# Patient Record
Sex: Female | Born: 1950
Health system: Southern US, Community
[De-identification: ages and names within clinical notes are randomized; demographics above are authoritative.]

## PROBLEM LIST (undated history)

## (undated) DIAGNOSIS — I779 Disorder of arteries and arterioles, unspecified: Secondary | ICD-10-CM

## (undated) DIAGNOSIS — M1990 Unspecified osteoarthritis, unspecified site: Secondary | ICD-10-CM

## (undated) DIAGNOSIS — Q248 Other specified congenital malformations of heart: Secondary | ICD-10-CM

## (undated) DIAGNOSIS — M545 Low back pain, unspecified: Secondary | ICD-10-CM

## (undated) DIAGNOSIS — M5430 Sciatica, unspecified side: Secondary | ICD-10-CM

## (undated) DIAGNOSIS — J40 Bronchitis, not specified as acute or chronic: Secondary | ICD-10-CM

## (undated) DIAGNOSIS — I8390 Asymptomatic varicose veins of unspecified lower extremity: Secondary | ICD-10-CM

## (undated) DIAGNOSIS — E785 Hyperlipidemia, unspecified: Secondary | ICD-10-CM

## (undated) DIAGNOSIS — R42 Dizziness and giddiness: Secondary | ICD-10-CM

## (undated) DIAGNOSIS — I10 Essential (primary) hypertension: Secondary | ICD-10-CM

## (undated) HISTORY — DX: Essential (primary) hypertension: I10

## (undated) HISTORY — DX: Low back pain: M54.5

## (undated) HISTORY — DX: Sciatica, unspecified side: M54.30

## (undated) HISTORY — PX: ABDOMINAL HYSTERECTOMY: SHX81

## (undated) HISTORY — DX: Dizziness and giddiness: R42

## (undated) HISTORY — DX: Other specified congenital malformations of heart: Q24.8

## (undated) HISTORY — DX: Unspecified osteoarthritis, unspecified site: M19.90

## (undated) HISTORY — DX: Low back pain, unspecified: M54.50

## (undated) HISTORY — DX: Bronchitis, not specified as acute or chronic: J40

## (undated) HISTORY — PX: LUMBAR FUSION: SHX111

## (undated) HISTORY — DX: Hyperlipidemia, unspecified: E78.5

## (undated) HISTORY — PX: CARPAL TUNNEL RELEASE: SHX101

## (undated) HISTORY — DX: Disorder of arteries and arterioles, unspecified: I77.9

## (undated) HISTORY — PX: INGUINAL HERNIA REPAIR: SUR1180

## (undated) HISTORY — DX: Asymptomatic varicose veins of unspecified lower extremity: I83.90

---

## 1999-06-21 ENCOUNTER — Encounter: Payer: Self-pay | Admitting: Preventative Medicine

## 1999-06-21 ENCOUNTER — Ambulatory Visit (HOSPITAL_COMMUNITY): Admission: RE | Admit: 1999-06-21 | Discharge: 1999-06-21 | Payer: Self-pay | Admitting: Preventative Medicine

## 1999-07-05 ENCOUNTER — Encounter: Payer: Self-pay | Admitting: Preventative Medicine

## 1999-07-05 ENCOUNTER — Ambulatory Visit (HOSPITAL_COMMUNITY): Admission: RE | Admit: 1999-07-05 | Discharge: 1999-07-05 | Payer: Self-pay | Admitting: Preventative Medicine

## 1999-07-19 ENCOUNTER — Ambulatory Visit (HOSPITAL_COMMUNITY): Admission: RE | Admit: 1999-07-19 | Discharge: 1999-07-19 | Payer: Self-pay | Admitting: Preventative Medicine

## 1999-07-19 ENCOUNTER — Encounter: Payer: Self-pay | Admitting: Preventative Medicine

## 1999-09-19 ENCOUNTER — Encounter: Payer: Self-pay | Admitting: Neurosurgery

## 1999-09-19 ENCOUNTER — Ambulatory Visit (HOSPITAL_COMMUNITY): Admission: RE | Admit: 1999-09-19 | Discharge: 1999-09-19 | Payer: Self-pay | Admitting: Neurosurgery

## 2000-09-19 ENCOUNTER — Ambulatory Visit (HOSPITAL_COMMUNITY): Admission: RE | Admit: 2000-09-19 | Discharge: 2000-09-19 | Payer: Self-pay | Admitting: Neurosurgery

## 2000-09-19 ENCOUNTER — Encounter: Payer: Self-pay | Admitting: Neurosurgery

## 2000-10-03 ENCOUNTER — Ambulatory Visit (HOSPITAL_COMMUNITY): Admission: RE | Admit: 2000-10-03 | Discharge: 2000-10-03 | Payer: Self-pay | Admitting: Neurosurgery

## 2000-10-03 ENCOUNTER — Encounter: Payer: Self-pay | Admitting: Neurosurgery

## 2000-10-24 ENCOUNTER — Encounter: Payer: Self-pay | Admitting: Neurosurgery

## 2000-10-24 ENCOUNTER — Ambulatory Visit (HOSPITAL_COMMUNITY): Admission: RE | Admit: 2000-10-24 | Discharge: 2000-10-24 | Payer: Self-pay | Admitting: Neurosurgery

## 2000-11-07 ENCOUNTER — Ambulatory Visit (HOSPITAL_COMMUNITY): Admission: RE | Admit: 2000-11-07 | Discharge: 2000-11-07 | Payer: Self-pay | Admitting: Neurosurgery

## 2000-11-07 ENCOUNTER — Encounter: Payer: Self-pay | Admitting: Neurosurgery

## 2001-02-11 ENCOUNTER — Ambulatory Visit (HOSPITAL_COMMUNITY): Admission: RE | Admit: 2001-02-11 | Discharge: 2001-02-12 | Payer: Self-pay | Admitting: Neurosurgery

## 2001-02-11 ENCOUNTER — Encounter: Payer: Self-pay | Admitting: Neurosurgery

## 2001-03-18 ENCOUNTER — Encounter (HOSPITAL_COMMUNITY): Admission: RE | Admit: 2001-03-18 | Discharge: 2001-04-17 | Payer: Self-pay | Admitting: Neurosurgery

## 2001-12-09 ENCOUNTER — Encounter (HOSPITAL_COMMUNITY): Admission: RE | Admit: 2001-12-09 | Discharge: 2002-01-08 | Payer: Self-pay | Admitting: Preventative Medicine

## 2002-06-04 ENCOUNTER — Encounter: Admission: RE | Admit: 2002-06-04 | Discharge: 2002-06-04 | Payer: Self-pay | Admitting: Specialist

## 2002-06-04 ENCOUNTER — Encounter: Payer: Self-pay | Admitting: Specialist

## 2002-09-14 ENCOUNTER — Encounter: Payer: Self-pay | Admitting: Specialist

## 2002-09-16 ENCOUNTER — Ambulatory Visit (HOSPITAL_COMMUNITY): Admission: RE | Admit: 2002-09-16 | Discharge: 2002-09-16 | Payer: Self-pay | Admitting: Specialist

## 2002-09-17 ENCOUNTER — Ambulatory Visit (HOSPITAL_COMMUNITY): Admission: RE | Admit: 2002-09-17 | Discharge: 2002-09-17 | Payer: Self-pay | Admitting: Specialist

## 2005-04-23 ENCOUNTER — Ambulatory Visit (HOSPITAL_COMMUNITY): Admission: RE | Admit: 2005-04-23 | Discharge: 2005-04-23 | Payer: Self-pay | Admitting: Family Medicine

## 2006-03-04 ENCOUNTER — Encounter (INDEPENDENT_AMBULATORY_CARE_PROVIDER_SITE_OTHER): Payer: Self-pay | Admitting: Internal Medicine

## 2006-04-25 ENCOUNTER — Ambulatory Visit (HOSPITAL_COMMUNITY): Admission: RE | Admit: 2006-04-25 | Discharge: 2006-04-25 | Payer: Self-pay | Admitting: Family Medicine

## 2006-05-03 ENCOUNTER — Ambulatory Visit: Payer: Self-pay | Admitting: Internal Medicine

## 2006-05-22 ENCOUNTER — Encounter (INDEPENDENT_AMBULATORY_CARE_PROVIDER_SITE_OTHER): Payer: Self-pay | Admitting: Internal Medicine

## 2006-05-24 ENCOUNTER — Ambulatory Visit: Payer: Self-pay | Admitting: Internal Medicine

## 2006-05-28 ENCOUNTER — Encounter (INDEPENDENT_AMBULATORY_CARE_PROVIDER_SITE_OTHER): Payer: Self-pay | Admitting: Internal Medicine

## 2006-05-28 ENCOUNTER — Ambulatory Visit (HOSPITAL_COMMUNITY): Admission: RE | Admit: 2006-05-28 | Discharge: 2006-05-28 | Payer: Self-pay | Admitting: Internal Medicine

## 2006-06-10 ENCOUNTER — Ambulatory Visit: Payer: Self-pay | Admitting: Internal Medicine

## 2006-06-22 ENCOUNTER — Encounter (INDEPENDENT_AMBULATORY_CARE_PROVIDER_SITE_OTHER): Payer: Self-pay | Admitting: Internal Medicine

## 2006-07-08 ENCOUNTER — Ambulatory Visit: Payer: Self-pay | Admitting: Internal Medicine

## 2006-07-15 ENCOUNTER — Encounter (INDEPENDENT_AMBULATORY_CARE_PROVIDER_SITE_OTHER): Payer: Self-pay | Admitting: Internal Medicine

## 2006-07-15 ENCOUNTER — Ambulatory Visit (HOSPITAL_COMMUNITY): Admission: RE | Admit: 2006-07-15 | Discharge: 2006-07-15 | Payer: Self-pay | Admitting: Internal Medicine

## 2006-07-16 ENCOUNTER — Encounter (INDEPENDENT_AMBULATORY_CARE_PROVIDER_SITE_OTHER): Payer: Self-pay | Admitting: Internal Medicine

## 2006-07-22 ENCOUNTER — Ambulatory Visit: Payer: Self-pay | Admitting: Internal Medicine

## 2006-07-29 ENCOUNTER — Encounter (INDEPENDENT_AMBULATORY_CARE_PROVIDER_SITE_OTHER): Payer: Self-pay | Admitting: Internal Medicine

## 2006-08-30 ENCOUNTER — Encounter (INDEPENDENT_AMBULATORY_CARE_PROVIDER_SITE_OTHER): Payer: Self-pay | Admitting: Internal Medicine

## 2006-09-09 ENCOUNTER — Encounter (INDEPENDENT_AMBULATORY_CARE_PROVIDER_SITE_OTHER): Payer: Self-pay | Admitting: Internal Medicine

## 2006-10-14 ENCOUNTER — Encounter: Admission: RE | Admit: 2006-10-14 | Discharge: 2006-10-14 | Payer: Self-pay | Admitting: Specialist

## 2006-11-18 DIAGNOSIS — I10 Essential (primary) hypertension: Secondary | ICD-10-CM | POA: Insufficient documentation

## 2006-11-18 DIAGNOSIS — I951 Orthostatic hypotension: Secondary | ICD-10-CM | POA: Insufficient documentation

## 2006-11-18 DIAGNOSIS — E119 Type 2 diabetes mellitus without complications: Secondary | ICD-10-CM | POA: Insufficient documentation

## 2006-11-18 DIAGNOSIS — J40 Bronchitis, not specified as acute or chronic: Secondary | ICD-10-CM | POA: Insufficient documentation

## 2006-11-18 DIAGNOSIS — J438 Other emphysema: Secondary | ICD-10-CM | POA: Insufficient documentation

## 2006-11-18 DIAGNOSIS — M545 Low back pain, unspecified: Secondary | ICD-10-CM | POA: Insufficient documentation

## 2006-11-18 DIAGNOSIS — E785 Hyperlipidemia, unspecified: Secondary | ICD-10-CM | POA: Insufficient documentation

## 2006-11-18 DIAGNOSIS — R42 Dizziness and giddiness: Secondary | ICD-10-CM | POA: Insufficient documentation

## 2006-11-18 DIAGNOSIS — G43909 Migraine, unspecified, not intractable, without status migrainosus: Secondary | ICD-10-CM | POA: Insufficient documentation

## 2006-11-18 DIAGNOSIS — J45909 Unspecified asthma, uncomplicated: Secondary | ICD-10-CM | POA: Insufficient documentation

## 2006-11-18 DIAGNOSIS — G5700 Lesion of sciatic nerve, unspecified lower limb: Secondary | ICD-10-CM | POA: Insufficient documentation

## 2006-11-18 DIAGNOSIS — M1990 Unspecified osteoarthritis, unspecified site: Secondary | ICD-10-CM | POA: Insufficient documentation

## 2006-11-26 ENCOUNTER — Inpatient Hospital Stay (HOSPITAL_COMMUNITY): Admission: RE | Admit: 2006-11-26 | Discharge: 2006-11-30 | Payer: Self-pay | Admitting: Specialist

## 2007-03-18 ENCOUNTER — Encounter: Admission: RE | Admit: 2007-03-18 | Discharge: 2007-03-18 | Payer: Self-pay | Admitting: Specialist

## 2007-04-15 ENCOUNTER — Encounter: Admission: RE | Admit: 2007-04-15 | Discharge: 2007-04-15 | Payer: Self-pay | Admitting: Specialist

## 2007-05-30 ENCOUNTER — Ambulatory Visit (HOSPITAL_COMMUNITY): Admission: RE | Admit: 2007-05-30 | Discharge: 2007-05-30 | Payer: Self-pay | Admitting: Family Medicine

## 2007-07-30 DIAGNOSIS — I8390 Asymptomatic varicose veins of unspecified lower extremity: Secondary | ICD-10-CM | POA: Insufficient documentation

## 2007-07-30 DIAGNOSIS — H8110 Benign paroxysmal vertigo, unspecified ear: Secondary | ICD-10-CM | POA: Insufficient documentation

## 2007-07-30 DIAGNOSIS — M5430 Sciatica, unspecified side: Secondary | ICD-10-CM | POA: Insufficient documentation

## 2008-01-20 ENCOUNTER — Encounter (INDEPENDENT_AMBULATORY_CARE_PROVIDER_SITE_OTHER): Payer: Self-pay | Admitting: Internal Medicine

## 2008-06-17 ENCOUNTER — Inpatient Hospital Stay (HOSPITAL_COMMUNITY): Admission: RE | Admit: 2008-06-17 | Discharge: 2008-06-21 | Payer: Self-pay | Admitting: Specialist

## 2009-04-28 ENCOUNTER — Ambulatory Visit (HOSPITAL_COMMUNITY): Admission: RE | Admit: 2009-04-28 | Discharge: 2009-04-28 | Payer: Self-pay | Admitting: Family Medicine

## 2009-05-10 ENCOUNTER — Encounter: Payer: Self-pay | Admitting: Cardiology

## 2009-05-10 ENCOUNTER — Ambulatory Visit: Payer: Self-pay | Admitting: Cardiology

## 2009-05-10 DIAGNOSIS — R079 Chest pain, unspecified: Secondary | ICD-10-CM | POA: Insufficient documentation

## 2009-05-17 ENCOUNTER — Ambulatory Visit: Payer: Self-pay | Admitting: Cardiology

## 2009-05-17 ENCOUNTER — Encounter: Payer: Self-pay | Admitting: Cardiology

## 2009-05-17 ENCOUNTER — Encounter (HOSPITAL_COMMUNITY): Admission: RE | Admit: 2009-05-17 | Discharge: 2009-06-16 | Payer: Self-pay | Admitting: Cardiology

## 2009-05-18 ENCOUNTER — Encounter: Payer: Self-pay | Admitting: Cardiology

## 2009-06-02 ENCOUNTER — Ambulatory Visit: Payer: Self-pay | Admitting: Cardiology

## 2009-09-12 ENCOUNTER — Encounter: Admission: RE | Admit: 2009-09-12 | Discharge: 2009-09-12 | Payer: Self-pay | Admitting: Specialist

## 2009-11-03 ENCOUNTER — Encounter (INDEPENDENT_AMBULATORY_CARE_PROVIDER_SITE_OTHER): Payer: Self-pay | Admitting: *Deleted

## 2009-11-03 LAB — CONVERTED CEMR LAB
ALT: 15 units/L
AST: 21 units/L
Albumin: 4.8 g/dL
Alkaline Phosphatase: 49 units/L
Bilirubin, Direct: 0.1 mg/dL
Cholesterol: 157 mg/dL
Creatinine, Ser: 0.7 mg/dL
HDL: 29 mg/dL
Hgb A1c MFr Bld: 8.3 %
Total Protein: 7.4 g/dL
Triglycerides: 171 mg/dL

## 2009-11-09 ENCOUNTER — Encounter (INDEPENDENT_AMBULATORY_CARE_PROVIDER_SITE_OTHER): Payer: Self-pay | Admitting: *Deleted

## 2009-11-15 ENCOUNTER — Ambulatory Visit: Payer: Self-pay | Admitting: Cardiology

## 2009-11-21 ENCOUNTER — Ambulatory Visit (HOSPITAL_COMMUNITY): Admission: RE | Admit: 2009-11-21 | Discharge: 2009-11-21 | Payer: Self-pay | Admitting: Obstetrics and Gynecology

## 2010-06-06 ENCOUNTER — Inpatient Hospital Stay (HOSPITAL_COMMUNITY): Admission: RE | Admit: 2010-06-06 | Discharge: 2010-06-12 | Payer: Self-pay | Admitting: Specialist

## 2010-06-19 ENCOUNTER — Emergency Department (HOSPITAL_COMMUNITY): Admission: EM | Admit: 2010-06-19 | Discharge: 2010-06-19 | Payer: Self-pay | Admitting: Emergency Medicine

## 2010-09-21 ENCOUNTER — Encounter
Admission: RE | Admit: 2010-09-21 | Discharge: 2010-09-21 | Payer: Self-pay | Source: Home / Self Care | Attending: Specialist | Admitting: Specialist

## 2010-11-14 NOTE — Assessment & Plan Note (Signed)
Summary: 3 MO F/U   Visit Type:  Follow-up Primary Provider:  Mirna Mires, MD   History of Present Illness: 60 year old woman presents for a followup visit. She denies any exertional chest pain or limiting shortness of breath.  Recent labs from January 20 revealed AST 21, ALT 15, BUN 10, creatinine 0.7, potassium 4.3, LDL 94, triglycerides 171, total cholesterol 157, HDL 29.  She states that she was recently diagnosed with a urinary tract infection by Dr. Loleta Chance, presently on ampicillin. Her main complaint of late is increased reflux symptoms and also frank dysphagia to solid foods. This has been going on for the last few months. She indicates that food "sticks halfway down." She has seen Dr. Jena Gauss in the past.  Current Medications (verified): 1)  Metformin Hcl 1000 Mg Tabs (Metformin Hcl) .... Two Times A Day 2)  Lisinopril 20 Mg Tabs (Lisinopril) .... Take 1 Tablet By Mouth Once A Day 3)  Aspir-Low 81 Mg Tbec (Aspirin) .... Once Daily 4)  Simvastatin 20 Mg Tabs (Simvastatin) .... Take One Tablet By Mouth Daily At Bedtime 5)  Lovastatin 20 Mg Tabs (Lovastatin) .... Once Daily 6)  Novolin 70/30 70-30 % Susp (Insulin Isophane & Regular) .... Take 54 Units Subcutaneously Every Am 7)  Amlodipine Besylate 5 Mg Tabs (Amlodipine Besylate) .... Take 1 Tab Daily 8)  Omeprazole 20 Mg Cpdr (Omeprazole) .... Take 1 Tablet Once Daily 9)  Ampicillin 250 Mg Caps (Ampicillin) .... Take 1 Tablet By Mouth Four Times A Day  Complete 11/17/2009 10)  Vicodin 5-500 Mg Tabs (Hydrocodone-Acetaminophen) .... Take One Tablet By Mouth Every 4 Hrs As Needed For Back Pain  Allergies (verified): No Known Drug Allergies  Past History:  Past Medical History: Last updated: 05/10/2009 Asthma Diabetes mellitus, type II Hyperlipidemia Hypertension Low back pain Osteoarthritis Bronchitis Migraines Vertigo-07-22-06 Sciatica 07-22-06 Vericous Veins-Thrombosed superfical venous vericosity  Social History: Last  updated: 05/10/2009 Disabled  Single  Tobacco Use - No.  Alcohol Use - no  Review of Systems  The patient denies anorexia, fever, weight loss, chest pain, syncope, peripheral edema, prolonged cough, headaches, hemoptysis, and abdominal pain.         Otherwise reviewed and negative except as outlined above.  Vital Signs:  Patient profile:   60 year old female Height:      65 inches Weight:      199 pounds O2 Sat:      98 % on Room air Temp:     98.9 degrees F oral Pulse rate:   96 / minute BP sitting:   153 / 79  (left arm)  Vitals Entered By: Teressa Lower RN (November 15, 2009 11:30 AM)  O2 Flow:  Room air  Physical Exam  Additional Exam:  Comfortable in no acute distress. Overweight. HEENT: Conjuctivae and lids normal, oropharynx clear with moist mucosa. Neck: Supple, no elevated JVP, no loud carotid bruits, no thyromegaly or tenderness. Lungs: Nonlabored breathing at rest. CTA without rales or wheezes. Cor: PMI indistinct. RRR, normal S1/S2. Soft systolic murmur. No S3 or rub. S4 noted. Abd: Soft, NTND.  No HSM. No bruits. Normoactive bowel sounds. Ext: Trace ankle edema, non-pitting. Distal pulses 2+.     Impression & Recommendations:  Problem # 1:  CHEST PAIN UNSPECIFIED (ICD-786.50)  No recurrent exertional chest pain or limiting breathlessness. Symptoms have been fairly atypical, and prior noninvasive testing was reassuring. At this point no further ischemic workup is planned, unless symptoms progress. She is actually describing dysphasia and increased  reflux symptoms, and perhaps further gastroenterology evaluation will be needed. She has been taking omeprazole, although with progressive symptoms. I asked her to contact Dr. Luvenia Starch office since she had been evaluated by him in the past. We can have a followup visit in one year's time.  Her updated medication list for this problem includes:    Lisinopril 20 Mg Tabs (Lisinopril) .Marland Kitchen... Take 1 tablet by mouth once a  day    Aspir-low 81 Mg Tbec (Aspirin) ..... Once daily    Amlodipine Besylate 5 Mg Tabs (Amlodipine besylate) .Marland Kitchen... Take 1 tab daily  Problem # 2:  HYPERLIPIDEMIA (ICD-272.4)  Patient continues on simvastatin, with LDL less than 100.  Her updated medication list for this problem includes:    Simvastatin 20 Mg Tabs (Simvastatin) .Marland Kitchen... Take one tablet by mouth daily at bedtime    Lovastatin 20 Mg Tabs (Lovastatin) ..... Once daily  Patient Instructions: 1)  Your physician recommends that you schedule a follow-up appointment in: 1 year 2)  Your physician recommends that you continue on your current medications as directed. Please refer to the Current Medication list given to you today.

## 2010-11-14 NOTE — Miscellaneous (Signed)
Summary: labs 11/03/09 bmp,lipid,liver ,a1c  Clinical Lists Changes  Observations: Added new observation of CALCIUM: 9.5 mg/dL (54/06/8118 1:47) Added new observation of ALBUMIN: 4.8 g/dL (82/95/6213 0:86) Added new observation of PROTEIN, TOT: 7.4 g/dL (57/84/6962 9:52) Added new observation of SGPT (ALT): 15 units/L (11/03/2009 8:25) Added new observation of SGOT (AST): 21 units/L (11/03/2009 8:25) Added new observation of ALK PHOS: 49 units/L (11/03/2009 8:25) Added new observation of BILI DIRECT: 0.1 mg/dL (84/13/2440 1:02) Added new observation of CREATININE: 0.70 mg/dL (72/53/6644 0:34) Added new observation of BUN: 10 mg/dL (74/25/9563 8:75) Added new observation of BG RANDOM: 168 mg/dL (64/33/2951 8:84) Added new observation of CO2 PLSM/SER: 27 meq/L (11/03/2009 8:25) Added new observation of CL SERUM: 101 meq/L (11/03/2009 8:25) Added new observation of K SERUM: 4.3 meq/L (11/03/2009 8:25) Added new observation of NA: 140 meq/L (11/03/2009 8:25) Added new observation of LDL: 94 mg/dL (16/60/6301 6:01) Added new observation of HDL: 29 mg/dL (09/32/3557 3:22) Added new observation of TRIGLYC TOT: 171 mg/dL (02/54/2706 2:37) Added new observation of CHOLESTEROL: 157 mg/dL (62/83/1517 6:16) Added new observation of HGBA1C: 8.3 % (11/03/2009 8:25)

## 2010-12-06 ENCOUNTER — Ambulatory Visit: Payer: Self-pay | Admitting: Cardiology

## 2010-12-14 ENCOUNTER — Ambulatory Visit: Payer: Self-pay | Admitting: Cardiology

## 2010-12-28 LAB — TYPE AND SCREEN
Antibody Screen: POSITIVE
DAT, IgG: NEGATIVE

## 2010-12-28 LAB — CBC
HCT: 44.7 % (ref 36.0–46.0)
Hemoglobin: 14.3 g/dL (ref 12.0–15.0)
MCH: 30.2 pg (ref 26.0–34.0)
MCV: 94.3 fL (ref 78.0–100.0)
Platelets: 174 10*3/uL (ref 150–400)
Platelets: 247 10*3/uL (ref 150–400)
RBC: 4.74 MIL/uL (ref 3.87–5.11)
RDW: 13.9 % (ref 11.5–15.5)
WBC: 9.4 10*3/uL (ref 4.0–10.5)

## 2010-12-28 LAB — DIFFERENTIAL
Basophils Absolute: 0 10*3/uL (ref 0.0–0.1)
Basophils Relative: 0 % (ref 0–1)
Lymphocytes Relative: 37 % (ref 12–46)
Neutro Abs: 5.7 10*3/uL (ref 1.7–7.7)
Neutrophils Relative %: 52 % (ref 43–77)

## 2010-12-28 LAB — GLUCOSE, CAPILLARY
Glucose-Capillary: 115 mg/dL — ABNORMAL HIGH (ref 70–99)
Glucose-Capillary: 127 mg/dL — ABNORMAL HIGH (ref 70–99)
Glucose-Capillary: 136 mg/dL — ABNORMAL HIGH (ref 70–99)
Glucose-Capillary: 149 mg/dL — ABNORMAL HIGH (ref 70–99)
Glucose-Capillary: 151 mg/dL — ABNORMAL HIGH (ref 70–99)
Glucose-Capillary: 152 mg/dL — ABNORMAL HIGH (ref 70–99)
Glucose-Capillary: 160 mg/dL — ABNORMAL HIGH (ref 70–99)
Glucose-Capillary: 166 mg/dL — ABNORMAL HIGH (ref 70–99)

## 2010-12-28 LAB — POCT I-STAT 4, (NA,K, GLUC, HGB,HCT)
Glucose, Bld: 194 mg/dL — ABNORMAL HIGH (ref 70–99)
HCT: 35 % — ABNORMAL LOW (ref 36.0–46.0)
Potassium: 4.1 mEq/L (ref 3.5–5.1)
Sodium: 141 mEq/L (ref 135–145)

## 2010-12-28 LAB — COMPREHENSIVE METABOLIC PANEL
BUN: 8 mg/dL (ref 6–23)
CO2: 31 mEq/L (ref 19–32)
Chloride: 105 mEq/L (ref 96–112)
Creatinine, Ser: 0.75 mg/dL (ref 0.4–1.2)
GFR calc non Af Amer: >60 mL/min (ref >60)
Glucose, Bld: 120 mg/dL — ABNORMAL HIGH (ref 70–99)
Total Bilirubin: 0.5 mg/dL (ref 0.3–1.2)

## 2010-12-28 LAB — URINE MICROSCOPIC-ADD ON

## 2010-12-28 LAB — BASIC METABOLIC PANEL
BUN: 7 mg/dL (ref 6–23)
CO2: 28 mEq/L (ref 19–32)
Calcium: 7.6 mg/dL — ABNORMAL LOW (ref 8.4–10.5)
Chloride: 108 mEq/L (ref 96–112)
Creatinine, Ser: 0.91 mg/dL (ref 0.4–1.2)
GFR calc Af Amer: >60 mL/min (ref >60)
GFR calc non Af Amer: >60 mL/min (ref >60)
Potassium: 3.9 mEq/L (ref 3.5–5.1)
Potassium: 4.2 mEq/L (ref 3.5–5.1)
Sodium: 139 mEq/L (ref 135–145)

## 2010-12-28 LAB — URINALYSIS, ROUTINE W REFLEX MICROSCOPIC
Bilirubin Urine: NEGATIVE
Ketones, ur: NEGATIVE mg/dL
Ketones, ur: NEGATIVE mg/dL
Nitrite: NEGATIVE
Protein, ur: NEGATIVE mg/dL
Protein, ur: NEGATIVE mg/dL
Urobilinogen, UA: 0.2 mg/dL (ref 0.0–1.0)
Urobilinogen, UA: 1 mg/dL (ref 0.0–1.0)

## 2010-12-28 LAB — HEMOGLOBIN A1C: Mean Plasma Glucose: 160 mg/dL — ABNORMAL HIGH (ref <117)

## 2010-12-28 LAB — PROTIME-INR: Prothrombin Time: 13 seconds (ref 11.6–15.2)

## 2010-12-28 LAB — HEMOGLOBIN AND HEMATOCRIT, BLOOD
HCT: 25.8 % — ABNORMAL LOW (ref 36.0–46.0)
HCT: 31.7 % — ABNORMAL LOW (ref 36.0–46.0)
Hemoglobin: 10.2 g/dL — ABNORMAL LOW (ref 12.0–15.0)
Hemoglobin: 8.1 g/dL — ABNORMAL LOW (ref 12.0–15.0)
Hemoglobin: 9.2 g/dL — ABNORMAL LOW (ref 12.0–15.0)

## 2010-12-28 LAB — URINE CULTURE

## 2010-12-28 LAB — SURGICAL PCR SCREEN: Staphylococcus aureus: NEGATIVE

## 2011-02-27 NOTE — Op Note (Signed)
[name], [name]         ACCOUNT NO.:  [id number]   MEDICAL RECORD NO.:  [id number]          PATIENT TYPE:  INP   LOCATION:  5028                         FACILITY:  [facility]   PHYSICIAN:  Kerrin Champagne, M.D.   DATE OF BIRTH:  [birth year]   DATE OF PROCEDURE:  06/17/2008  DATE OF DISCHARGE:                               OPERATIVE REPORT   PREOPERATIVE DIAGNOSES:  L4-5 biforaminal stenosis with degenerative  spondylolisthesis status post L3-4 and L4-5 central laminectomy 1-1/2  years ago.  The foraminal entrapment is severe and it is far lateral.   POSTOPERATIVE DIAGNOSES:  L4-5 biforaminal stenosis with degenerative  spondylolisthesis status post L3-4 and L4-5 central laminectomy 1-1/2  years ago.  The foraminal entrapment is severe and it is far lateral.  The patient was found to have severe right side greater than left side  L4 foraminal stenosis.  Narrowed right L5 pedicle precluded placement of  pedicle screws at the right L5 pedicle and the right-sided construct.   PROCEDURES:  Bilateral L4 foraminal decompression; posterolateral fusion  L4-5 using combination of local bone graft and VITOSS bone extender  material; bone marrow aspirate left L5, left pedicle screw and rod  construct using DePuy Monarch pedicle screws and rods at L4-5.   SURGEON:  Kerrin Champagne, MD.   ASSISTANT:  Wende Neighbors, PA-C.   ANESTHESIA:  General via orotracheal intubation, Dr. Laverle Hobby,  supplemented with local infiltration of Marcaine 0.5% with 1:200,000  epinephrine 10 mL.   FINDINGS:  As above.   ESTIMATED BLOOD LOSS:  400 mL.   RETURNING BLOOD:  150 mL of hyper hematocrit cell saver blood.   COMPLICATIONS:  None.   DRAINS:  Foley to straight drain, Hemovac x1.   The patient returned to the PACU in good condition.   BRIEF CLINICAL HISTORY:  The patient is a 60 year old female.  She has  undergone previous lumbar laminectomy surgery a year and half ago at the  L4 and L5  levels.  She did well following her procedure and has had  recurring pain now with radiation into the right leg and L4 distribution  now worsening with inability to stand and ambulate any distance.  Followup studies have demonstrated radiographically signs of dynamic  spondylolisthesis at the L4-5 level grade 1.  Followup MRI studies  showed lateral recess stenosis, both sides with foraminal entrapment of  right L4 nerve root secondary to large lateral protruded disk.  This was  felt to represent large area of spondylosis associated with the lateral  aspect of the disk and entrapment of the nerve within the neuroforamen.  She was brought to the operating room to undergo a bilateral  foraminotomy at the L4-5 from the L4 nerve root and posterolateral  effusion.   INTRAOPERATIVE FINDINGS:  As above.  Unable to place pedicle screw  safely at the right side of the L5 level due to the size of the pedicle.  Therefore, the pedicle was removed, both L4 and L5 from the right side,  and unilateral construct was used.   DESCRIPTION OF PROCEDURE:  After adequate general anesthesia, the  patient had  neuro monitoring attached to her lower extremities.  She had  standard preoperative antibiotics.  Foley catheter placed, was turned to  a prone position and a Jackson spine table was used.  Standard prep with  DuraPrep solution, mid dorsal spine to mid sacral level draped in the  usual manner, iodine Vi-drape was used.  The incision eclipsing the old  incision scar at the L4-5 level after infiltration of Marcaine 0.5% with  1:200,000 epinephrine.  Exposure obtained from the L2 level to the L5  level inferiorly.  Cobb used to elevate the paralumbar muscles over the  posterior aspect of the lamina of L2 superiorly and L5 inferiorly, and  the base of the spinous process with each of these were identified.  Cobb was then used to swift the paralumbar muscle along the  posterolateral aspect, the remaining  posterior elements at L3-4 and L4-  5.  Cerebellar retractor was inserted.   A 3-mm Kerrison was used to enter the spinal canal over the superior  aspect of the left lamina of L5 and foraminotomy was performed in the L5  nerve root identifying the medial aspect of the previous laminotomy at  L4-5.  Penfield 4 was then used to carefully probe and develop the plain  between the L5 nerve root medial aspect of the pedicle and left-sided L5  leaving this in place.  Kocher clamp was then placed over the spinous  process of L5 and L2.  An intraoperative lateral C-arm was used to  confirm the L4-5 level with the Penfield 4 and both the L2 and the L5  spinous processes.  With this being in place, exposure was then obtained  over the lateral aspects of the spinal facets of the L4-5 levels, both  sides identifying the L5 transverse process.  Continuing dissection  laterally to the L3-4 level and then carefully performed dissection  laterally over the lateral aspects of the facets of L3-4 level  identifying the L4 transverse process bilaterally.  Bleeders controlled  using Monocryl and bipolar electrocautery.  Packing the areas  posterolaterally was necessary.  Decompression was then carried out at  the right side of the neuroforamen first using a 3-mm Kerrison performed  foraminotomy over the L5 nerve root resecting small portion of the  superior aspect of the lamina of L5, continuing laterally, and  performing foraminotomy over the L5 nerve root resecting residual  superior articular process of L5 on the right side, decompressing the  lateral recess here continuing superiorly.  Inferior articular process  of L4 was then resected in its entirety using osteotome.  An osteotome  was then used to resect the superior portion of the L5 articular  process, and this was resected and decompressed in the right side of the  L4 neuroforamen.  This was completed and then a hockey-stick nerve probe  could be  passed out the neuroforamen over the L4 nerve root in the right  side of scar tissue overlying the nerve root and reflected ligamentum  flavum was further resected using 3-mm Kerrison, still the nerve was  felt to be completely free, palpated beneath the nerve, ventrally and  laterally.  This was not felt to be herniated to represent spur of the  lateral aspect to the right L4-5 disk space.  With this completed, then  Gelfoam was then placed over the foraminotomy each in the hemilaminotomy  region on the right side of the L4-5.  An awl was then used to make an  usual entry  point of the left side L4.  At the intersection of  Transverse process insertion and lateral aspect of the pedicle of L4  resection.  High-speed bur was used to remove overhanging of facet  inferiorly and then C-arm fluoro used to determine the correct  positional alignment for this entry point.  A pedicle probe was then  used to probe the pedicle in the left side L4 level probing to the depth  of nearly 40 mm.  A 40-mm length screw was chosen to tap with the 4.75  tape, unable to withdraw bone marrow aspirate at this level as the  pedicle was felt to be too tight.  Tapping with a 4.75 tap and a 5.5  screw was placed on the left side measuring 40 mm.  Decortication of the  transverse process at L4 was carried out and a bone graft was harvested  from the facet resecting the right side as well as decorticated facet in  the left side.  It was then placed over the posterior aspect of the  transverse process of L4.  High-speed bur was used for decortication  purposes.  Next, medial overhanging of facet of the L4-5 level was  carefully resected.  Using Leksell rongeur and high-speed bur, osteotomy  was performed with decortication of the left side L4-5 facet.  The entry  point expected for the L5 pedicle was then identified in the inner  section of the transverse process of L5 with lateral aspect to the  superior articular  process of L5.  An awl was used to make initial entry  point.  Then, the pedicle probe was used to probe the pedicle using a  mallet as necessary in order to allow the pedicle probe to penetrate  this pedicle.  This was directed using C-arm fluoro and correct  direction appeared to be present.  Note, that a hockey-stick neuro probe  was also used to probe the interspinal area along the medial aspect of  the pedicle superior to the L5 pedicle, inferior as well, ensured that  there was no broaching of cortex here.  A ball-tipped probe was also  used to probe following the placement of the pedicle finder to a depth  of about 35 mm.  A 35 x 5.5 mm screw was chosen, tapping with a 4.75 tap  decorticating transverse process of L5 obtaining bone marrow aspirate  from the left L5 pedicle using bone marrow aspiration equipment.  A 10  mL was obtained, placed over the 10 mL of VITOSS, this was then cut in  strips and then placed over the left side extending from the L4  transverse process to L5 transverse process in addition to local bone  graft.  A 35 x 5.5 mm screw was then placed on the left side at the L5  level obtaining excellent purchase.  Next, screw was inserted on the  right side of the L4 level, and an awl was used to make an entry point  at the intersection of transverse process of the L4 with the lateral  aspect to the pedicle of L4 and the superior articular process of L4  here.  Next, pedicle probe was then used to probe the pedicle to a depth  of about 35 mm.  A ball-tipped probe was used to probe the channel,  pedicle inserted patency, and no sign of broaching of cortex.  Pedicle  screw was then placed without difficulty in the right side of the L4  level.  Decortication of transverse process, application of VITOSS as  well as local bone graft over the right side of the L4 was carried out.  Attention was then turned to the right L5 pedicle, where the  intersection of the transverse  process of the L5 on the right side with  the lateral aspect pedicle was identified with C-arm fluoro.  There is  great deal of overhanging of both the L5 and S1 facet over the inferior  aspect of the pedicle, as well as the lateral aspect of the L4-5 facet.  The transverse process itself was merely vertical, and the plain  remained anterior to posterior as opposed to craniocaudal.  Initial  entry point was obtained on insertion of the pedicle probe.  Pedicle  probe was felt to be entering superiorly and felt to be in course to  superior at this point.  With the foraminotomy in the right side and the  laminectomy open the superior aspect of the pedicle of L5 and the medial  aspect was easily identified, it was felt that there was very minimal  broaching of the medial aspect of the cortex of L5.  Decortication was  carried down from the right side of the L5 transverse process after  using the awl for initial entry point, then the adequate probe.  Tapping  was carried down using a 4.75 tape with the placement of a 35 mm screw;  however, the pedicle is felt to loosen and it was felt that the pedicle  had gone on to fracture as it was primarily cortical bone without any  significant cancellous component.  Screw was removed at this level and  any loose fragments of bone were removed.  Both the L5 and L4 nerve root  felt to be exiting without abnormality.  Intraoperative neuro monitoring  demonstrated no abnormalities during this course and procedure.  Pedicle  screw fixation of the right side was abandoned at this point as it was  felt that the pedicle was too small to allow for a pedicle screw to be  placed.  Bone graft was then placed over the transverse process of L4-L5  and the pedicle screw of the right L4 level was then removed using  insertion and handle device.  Care was taken to ensure adequate bone  grafting.  It was carried down on its right side with abundant VITOSS  material of the  bone graft.  Intraoperative neuro testing and soft  tissue resistance in screws in left side of L4 and L5 were carried out,  both measured greater than 30.  With this done, any residual remaining  bone graft was then placed posterolaterally extending from transverse  process of the left side of L4-L5 within the facet and left side of the  L4-L5.  A 35 mm precontoured rod was then placed into the fasteners on  the left side of the L4 and L5 level following screw testing.  Caps were  then placed and each were then tightened to 80 foot-pounds, providing  fixation of the L4-5 level.  Irrigation was carried out.  Careful  inspection of the nerve roots demonstrated both L5 and L4 nerve roots  exiting without any further nerve root compression.  A single medium  Hemovac drain was placed in the depth of the incision on the right side  exiting out the right lower lumbar.  The patient's paralumbar muscles  loosely approximated in midline with interrupted #1 Vicryl sutures.  Lumbodorsal fascia reapproximated to itself at  the spinous process of L2  above and L5 below with interrupted #1 Vicryl sutures.  The deep subcu  layers reapproximated with interrupted 0 Vicryl sutures and more  superficial layers with interrupted 2-0 Vicryl sutures, and the skin  closed with a SQ stitch of 4-0 Vicryl.  Dermabond was then applied, 4 x  4s, ABD pads affixed to the skin with Hypafix tape.  The patient then  also had radiographs obtained intraoperatively and documented the  internal fixation, pedicle screws and rods using C-arm fluoro.  The  patient was then returned to recovery room following  return to supine position, reactivation, and extubation in satisfactory  condition.  All instruments and sponge counts were correct.  The patient  had, in the preoperative holding area on marking of the incision site,  pain primarily right L4-L5.  Intraoperative time-out was carried out  identifying the patient, the level of  expected procedure.      Kerrin Champagne, M.D.  Electronically Signed     JEN/MEDQ  D:  06/17/2008  T:  06/18/2008  Job:  [id number]

## 2011-03-02 NOTE — Discharge Summary (Signed)
[name], [name]         ACCOUNT NO.:  [id number]   MEDICAL RECORD NO.:  [id number]          PATIENT TYPE:  INP   LOCATION:  5036                         FACILITY:  [facility]   PHYSICIAN:  Kerrin Champagne, M.D.   DATE OF BIRTH:  1951-08-18   DATE OF ADMISSION:  11/26/2006  DATE OF DISCHARGE:  11/30/2006                               DISCHARGE SUMMARY   ADMISSION DIAGNOSES:  1. Lumbar spinal stenosis central L3-4 and L4-5.  2. History of migraine headaches,  3. Diabetes.  4. Hypertension.   DISCHARGE DIAGNOSES:  1. Lumbar spinal stenosis central L3-4 and L4-5.  2. History of migraine headaches,  3. Diabetes.  4. Hypertension.  5. Urinary tract infection.  6. Hypokalemia, resolved.   PROCEDURE:  On November 26, 2006, the patient underwent central  laminectomy, L3-4 and L4-5, with decompression of bilateral L3,  bilateral L4 and bilateral L5 nerve roots performed by Dr. Otelia Sergeant,  assisted by Maud Deed, P.A.-C., under general anesthesia.   CONSULTATIONS:  None.   BRIEF HISTORY:  The patient is a 60 year old female with several month  history of increasing back pain with radiation into bilateral lower  extremities with neurogenic claudication.  She is now unable to ambulate  more within a few hundred feet without developing significant weakness  and pain in her thighs and her legs below the knee.  She has undergone  epidural steroid injections which have not given her relief of her  symptoms.  Studies have demonstrated significant lumbar spinal stenosis  at the L3-4 level and the L4-5 level.  It was felt that she would  require surgical intervention and was admitted for the procedure as  stated above.   BRIEF HOSPITAL COURSE:  The patient tolerated the procedure under  general anesthesia without complications.  First postoperative day,  neurovascular and motor function of the lower extremities was noted to  be intact.  Hemovac drain was discontinued on the first  postoperative  day.  The patient was treated with incentive spirometry and coughing and  deep breathing.  O2 saturations were monitored and remained between 95  and 99%.  She initially was treated with PCA medication and eventually  was weaned to p.o. analgesics.  The patient did have some elevated  temperatures.  She was treated for atelectasis with incentive  spirometry.  Urinalysis was obtained, and the patient was noted to have  a urinary tract infection which was treated with Cipro 500 mg p.o.  b.i.d.  The patient's Foley catheter was discontinued, and she was able  to void.  She had mild hypokalemia, treated with oral supplementation.  Dressing changes done daily without signs of infection.  Hemoglobin A1c  was elevated at 8.6, and she did have some elevated blood sugars during  the hospital stay.  She was placed on a low-carbohydrate diet.  The  patient received physical therapy for ambulation and gait training.  She  utilized a walker for ambulation.  Occupational therapy assisted with  ADLs.  On November 30, 2006, the patient was afebrile, vital signs were  stable and she was stable for discharge.   PERTINENT LABORATORY VALUES:  Admission CBC within normal limits.  Hemoglobin and hematocrit postoperatively 12.0 and 34.7, respectively.  Chemistry studies on admission within normal limits with exception of  glucose 118.  Elevated glucose noted to 168 on BMET.  Hypokalemia at 3.4  resolved with oral supplementation.  Hemoglobin A1c 8.6.  Urinalysis  with 3 to 6 WBCs, 3 to 6 RBCs, moderate leukocyte esterase and moderate  hemoglobin as stated above.  Urine culture with no growth.   PLAN:  The patient was discharged to her home.  She was instructed to  walk as much as tolerated.  She will use her walker.  She will be  allowed to shower.  She is instructed to change her dressing daily.  She  is encouraged to remain on a diabetic diet and to avoid concentrated  sweets.  She will  follow up with Dr. Otelia Sergeant 2 weeks from the date of her  surgery.  Prescriptions given at discharge include Vicodin 1 to 2 every  4 to 6 hours as needed for pain, Robaxin 500 mg 1 every 8 hours as  needed for spasms, Cipro 500 mg 1 every 12 hours for 1 week.  Stool  softener and laxative as needed.  The patient was advised to call the  office should she have questions or concerns prior to her return office  visit.   CONDITION ON DISCHARGE:  Stable.      Wende Neighbors, P.A.      Kerrin Champagne, M.D.  Electronically Signed    SMV/MEDQ  D:  01/29/2007  T:  01/29/2007  Job:  [id number]

## 2011-03-02 NOTE — Op Note (Signed)
NAMEFAYLINN, [name]         ACCOUNT NO.:  [id number]   MEDICAL RECORD NO.:  [id number]          PATIENT TYPE:  INP   LOCATION:  5036                         FACILITY:  [facility]   PHYSICIAN:  Kerrin Champagne, M.D.   DATE OF BIRTH:  24-Aug-1951   DATE OF PROCEDURE:  11/26/2006  DATE OF DISCHARGE:                               OPERATIVE REPORT   PREOPERATIVE DIAGNOSIS:  Lumbar spinal stenosis, central L3-4 and L4-5.   POSTOPERATIVE DIAGNOSIS:  Lumbar spinal stenosis, central L3-4 and L4-5.   PROCEDURE:  Central laminectomy, L3-4 and L4-5 with decompression of  bilateral L3, bilateral L4 and bilateral L5 nerve roots.   SURGEON:  Kerrin Champagne, M.D.   ASSISTANT:  Wende Neighbors, P.A.   ANESTHESIA:  General via orotracheal intubation, Dr. Judie Petit.   SPECIMENS:  None.   ESTIMATED BLOOD LOSS:  150 mL.   COMPLICATIONS:  None.   DRAINS:  Hemovac x1.  The patient returned to the PACU in good  condition.   HISTORY OF PRESENT ILLNESS:  The patient is a 60 year old female who has  been experiencing increasing back pain, radiation in the legs with  neurogenic claudication that has been worsening over the last several  years.  She has reached the point where she is unable to ambulate more  than several hundred feet with weakness in her thighs and in her legs  below the knees.  She has a history of diabetes.  She underwent ESI, but  this did not appear to relieve her discomfort.  Preoperative studies  demonstrate significant lumbar spinal stenosis to 6 mm at the L3-4 level  and 4 mm at the L4-5 level.  She was brought to the operating room to  undergo central decompressive laminectomy to L3-4 and L4-5.  She has had  previous lumbar laminectomy surgery at L5-S1 for disk herniation in the  past.   INTRAOPERATIVE FINDINGS:  The patient was found to have severe  hypertrophic ligamentum flavum as well as facet hypertrophy causing  impression on the lateral recesses of both  L4-5 and at L3-4 affecting  primarily the L4 and L5 nerve roots.  Foraminal entrapment also  affecting bilateral L3, bilateral L4, bilateral L5 nerve roots.   DESCRIPTION OF PROCEDURE:  After adequate general anesthesia, the  patient in a prone position.  Wilson frame was used.  TED hose for both  lower extremities.  All extremities well padded over pressure areas.  Standard prep with DuraPrep solution from the lower dorsal spine to the  mid-sacral segment.  Standard preoperative antibiotics of Ancef.  Draped  in the usual manner following prep with DuraPrep solution and Iodine Vi-  Drape was used.   An incision approximately 5-6 cm in length ellipsing the old incisional  scar at the inferior aspect of the incision.  Through the skin and  subcutaneous layers down to the spinous processes of L2, L3, L4 and L5.  Bleeders were controlled using electrocautery.  The lumbodorsal fascia  was then incised midline overlying the spinous process at L4 and L3.  Clamps were then placed on these spinous processes and intraoperative  lateral radiograph  demonstrating clamps at the L3 and L4 levels spinous  process.  Cobbs used to elevate the paralumbar muscles off the posterior  aspect of the lamina and spinous process of L3-4, L2-3 and L4-5 levels.  Bleeders controlled using electrocautery.  The McCullough retractor  inserted, obtaining excellent visualization posteriorly.  Note that both  the L3 and L4 spinous processes were marked with cautery for their  continued identification during the case.  All bleeding was controlled  and a Leksell rongeur was then used to remove the spinous process of L4  and L3 down to its base.  A small portion inferior aspect of L2.  Spinous process was resected over about 20%.  Superior aspect of L5 of  less than 10%.  Posterior aspect of the lamina and spinous processes  were then thinned centrally, again using a Leksell rongeur.  The pars  area was identified and  determined using a Penfield for both sides.  Loupe magnification and a headlamp were used for the initial portion of  the procedure and these were then used for visualization purposes.   A central laminectomy was first performed by removing the base of the  spinous process of the central portions of the lamina of L4 and L3,  standing upwards using 3 mm Kerrisons and 4 mm Kerrisons.  Small  cottonoids were necessary to protect the thecal sac especially at the  superior aspect of the neural arch of both segments.  When this was  completed and the lamina resected also centrally at the L4 and L3  segments, then the ligamentum flavum was debrided at the L3-4 interval  bilaterally out to the lateral recesses of L2-3 and then the central  laminectomy was widened, resecting further medial aspect of the open  central laminectomy region first on the right side.  Resecting bone  backwards until the pedicle at the L3 level was easily felt.  Then using  osteotomes to perform partial medial resection of the inferior articular  process of L3 bilaterally, approximately 10%, and also at L4 bilaterally  using osteotome and a mallet.  Ligamentum flavum remained attached to  the superior articular process of L4 and of L5 at both of these levels.  This was resected further using 3 mm Kerrisons.  Derrico retractor was  used to carefully protect soft tissue structures present as well as  cottonoids.  The superior articular processes of L4 were then carefully  resected medially over about 10-15% resecting the ligamentum flavum in  its reflected portion and performing foraminotomy then over both the L3  and L4 nerve roots on the right side and then on the left side.  At the  L3-4 level and L4-5 level, the patient had quite a bit of sensitivity to  the nerve roots.  They were entrapped due to the reflected portion of  ligamentum flavum at both levels.  Careful retraction using Derrico allowed for decompression  from superior to inferior out the neural  foramen over both the L3 and L4 nerve roots and then L5 nerve roots,  protecting the nerve quite well during these procedures.  The very  superior most portions of the superior articular process of L4 and L5  were resected in order to allow for the egress of the L3 and L4 nerve  roots out their neural foramen without any pressure.   When this was completed on the right side, attention was turned to the  left side, first performing hemostasis using bone wax applied to the  bleeding cancellous bone surfaces of the medial facet areas at both  segments and along the open laminotomy region.  Excess bone wax was  removed.  Thrombin-soaked Gelfoam placed within the lateral recesses and  attention then turned to the left side using again headlamps and loupe  magnification.  On the left side then, the ligamentum flavum was  resected at the L2-3 level and decompression carried out medially over  the pars area, preserving at least 8 mm of pars on both sides.  The  remaining superior articular process at L3 was debrided of ligamentum  flavum and reflected portions of ligamentum flavum as well as the  superior most portion of the superior articular process allowing for the  L3 nerve root and to exit out the L3 neuro foramen without any  compression here.  The ligamentum flavum and reflected portions were  resected off the medial aspect of the facet at the L3-4 level on the  left side and this was resected out lateral to offset the L4.  Pedicle  was easily palpated using a hockey stick nerve probe as was the L3  above.  Then continuing downwards, L3 nerve root and L4 nerve root were  well decompressed, again resecting the reflected portion of the  ligamentum flavum at the L4-5 level along the superior articular process  of L5 on this left side and performing foraminotomy over the L4 nerve  root.   A foraminotomy was then performed over the L5 nerve root  resecting  hypertrophic joint here as well as reflected ligamentum flavum causing  and pressing upon the left L5 nerve root.  Old steroid appeared to be  visible over the region of the lateral recess at the left L4-5 level  over the left L5 nerve root.  This was left in place.  Superior portion  of the lamina of L5 then also underwent partial laminectomy removing  about 2 or 3 mm of bone and allowing for decompression of the central  portions of the spinal canal at this segment.  Bone wax was applied to  the bleeding cancellous bone surfaces along the left side medial aspect  of the facet of L3-4 and L4-5.  Excess bone wax removed.  Bleeding  controlled using thrombin-soaked Gelfoam and cottonoids.  The operating  room microscope was carefully draped and brought into the field  sterilely.  Under the operating room microscope, then the left-sided  foraminotomies at L3, L4 and L5 were carefully evaluated.  Excess residual ligamentum flavum was resected using 3 and 4 mm Kerrisons over  these nerve roots and foramen and the central portions of the  laminectomy were carefully observed to be well decompressed.  Hockeystick nerve probe could be easily passed out the L3, L4 and L5  neuro foramen demonstrating their patency.   On the right side similarly the operating microscope was used to observe  the L3, L4 and L5 nerve roots as they egressed out the neural foramen  demonstrating their patency using a hockey stick nerve probe.  This  completed, there was no active bleeding present from the central  laminectomy region.  Hemostasis had been obtained over the medial  aspects of the facet bilaterally at L3-4, L4-5.   A careful inspection of the soft tissues demonstrated no significant  abnormalities of either side.  The Hemovac drain was placed in the depth  of the incision over the left side extending up to the L1-2 level.  The  deep paralumbar muscles approximated loosely  in the midline with   interrupted #1 Vicryl sutures avoiding the Hemovac drain.  Irrigation  was performed prior to this.  Then the lumbodorsal fascia approximated  in the midline with interrupted #1 Vicryl sutures and figure-of-eight in  simple fashion.  Deep subcu layer was approximated with interrupted 0  Vicryl sutures, the more superficial layers with interrupted 2-0 Vicryl  sutures and the skin closed with a running subcu stitch of 4-0 Vicryl.  Dermabond was then applied to the skin edges.  Once this was dried 4 x  4s affixed to the skin with Hypafix tape, taping the drain in place.  All instrument and sponge counts were correct.  The patient was then  returned to a supine position, reactivated and extubated and returned to  the recovery room in satisfactory condition.  All instrument and sponge  counts were correct.      Kerrin Champagne, M.D.  Electronically Signed     JEN/MEDQ  D:  11/26/2006  T:  11/27/2006  Job:  [id number]

## 2011-03-02 NOTE — Op Note (Signed)
[place]  Patient:    [name], [name]                MRN: [id number] Proc. Date: 02/11/01 Adm. Date:  [id number] Attending:  Gerald Dexter                           Operative Report  PREOPERATIVE DIAGNOSIS:  Herniated disk L5-S1 right.  POSTOPERATIVE DIAGNOSIS:  Herniated disk L5-S1 right.  PROCEDURES: 1. Right L5-S1 intralaminar laminotomy for excision of herniated disk with    operating microscope. 2. Microdissection L5-S1 disk and S1 nerve root.  SURGEON:  Reinaldo Meeker, M.D.  ASSISTANT:  Julio Sicks, M.D.  PROCEDURE IN DETAIL:  After being placed in the prone position, the patients back was prepped and draped in usual sterile fashion.  Localizing x-ray was taken prior to incision to identify the L5-S1 level.  Midline incision made above the spinous processes of L5 and S1.  Using the Bovie cutting current, the incision was carried down the spinous processes.  A subperiosteal dissection was then carried out along the right side of spinous processes and lamina and McCullough self-retaining retractor was placed for exposure.  A second x-ray was taken to confirm approach at the L5-S1 level and this was correct.  Using the high-speed drill, the inferior one-half of the L5 lamina and the medial one-third of the facet joint were removed.  The drill was then used to remove the superior one-third of the S1 lamina.  Residual bone and ligamentum flavum were removed in a piecemeal fashion.  The microscope was draped and brought into the field and used for the remainder of the case. Using microdissection technique, the lateral aspect of the thecal sac and S1 nerve root were identified.  Further coagulation was carried out down the floor of the canal to identify the L5-S1 disk, which was found to be herniated and calcified.  The calcified annulus was fractured with a straight small osteotome and then the disk material within the disk  space was cleaned out. Epstein curets were used to further push calcified material down in the disk space so that it could be removed.  A very thorough disk space cleanout was carried out medially under the nerve root and midline thecal sac. Inspection was then carried out towards the foramen on the right side, where some calcified disk was also present.  This was removed in a similar fashion by pushing the calcified material into the disk space and then removing it with the pituitary rongeurs.  At this point, inspection was carried out in all directions for any evidence of residual compression and none could be identified.  Large amounts of irrigation were carried out and any bleeding controlled with bipolar coagulation and Gelfoam.  The wound was then closed using interrupted Vicryl on the muscle, fascia, subcutaneous and subcuticular tissues and staples on the skin.  Sterile dressing was then applied.  Patient was extubated and taken to recovery room in stable condition. DD:  02/11/01 TD:  02/11/01 Job: [id number]

## 2011-03-02 NOTE — Discharge Summary (Signed)
[name], [name]         ACCOUNT NO.:  [id number]   MEDICAL RECORD NO.:  [id number]          PATIENT TYPE:  INP   LOCATION:  5028                         FACILITY:  [facility]   PHYSICIAN:  Kerrin Champagne, M.D.   DATE OF BIRTH:  Sep 17, 1951   DATE OF ADMISSION:  06/17/2008  DATE OF DISCHARGE:  06/21/2008                               DISCHARGE SUMMARY   ADMISSION DIAGNOSES:  1. L4-L5 biforaminal stenosis with a degenerative spondylolisthesis,      status post L3-L4 and L4-L5 central laminectomy 1-1/2 years prior.  2. Diabetes mellitus.  3. Hypertension.  4. History of migraine headaches.   DISCHARGE DIAGNOSES:  1. Left L4-L5 biforaminal stenosis with degenerative      spondylolisthesis, status post L3-L4 and L4-L5 central laminectomy      1-1/2 years prior.  2. Severe and far lateral foraminal entrapment, right greater than      left L4 foraminal stenosis.  3. Narrowed right L5 pedicle; precluded placement of pedicle screws at      the right L5 pedicle and right-sided construct.   REMAINING DIAGNOSES:  1. Left L4-L5 biforaminal stenosis with degenerative      spondylolisthesis, status post L3-L4 and L4-L5 central laminectomy      1-1/2 years prior.  2. Severe and far lateral foraminal entrapment, right greater than      left L4 foraminal stenosis.  3. Narrowed right L5 pedicle; precluded placement of pedicle screws at      the right L5 pedicle and right-sided construct.  4. Posthemorrhagic anemia.   PROCEDURE:  On June 17, 2008, the patient underwent bilateral L4  foraminal decompression, posterolateral fusion L4-L5 using combination  of local bone graft and Vitoss bone extender material.  Bone marrow  aspirate, left L5 pedicle.  Left pedicle screw in right construct using  DePuy Monarch pedicle screws and rods at L4-L5.  This was performed by  Dr. Otelia Sergeant, assisted by Maud Deed, PA-C under general anesthesia.   CONSULTATIONS:  None.   BRIEF HISTORY:  The  patient is a 60 year old female with status post L3-  L4 and L4-L5 central laminectomy.  She has done well following her  procedure for over a year and now has presented with recurring pain and  radiation into the right lower extremity with the L4 distribution.  She  has had worsening of her pain and inability to stand and ambulate  distances.  Radiographically, she has signs of dynamic spondylolisthesis  at the L4-L5 level, which was noted to be grade 1.  Follow up MRI  studies showed lateral recess stenosis both sides with foraminal  entrapment of the right L4 nerve root secondary to large lateral disk  protrusion.  It was felt, the study represented large area of  spondylosis associated with the lateral aspect of the disk entrapment of  the nerve root in the neural foramen.  It was felt that she would  require surgical intervention and was admitted for the procedure as  stated above.   BRIEF HOSPITAL COURSE:  The patient tolerated the procedure under  general anesthesia without complications.  Postoperatively,  neurovascular motor function of the  lower extremities was noted to be  intact.  She was started on physical therapy program for ambulation and  gait training.  She utilized her lumbar brace for ambulation purposes.  She was not required to wear her brace while at bedrest.  The patient  eventually was able to comfortably ambulate and it was felt to be safe  from the physical therapy standpoint.  During the hospital course, she  was able to ambulate as much as 120 feet.  She did demonstrate the  ability to don and doff the brace without difficulty.  The patient's  Hemovac drain was discontinued on the first postoperative day and  dressing changes were done daily thereafter.  Wound was healing without  signs of infection.  The patient was monitored for ileus.  Bowel sounds  were slow to return.  Gradually, she was able to have flatus and bowel  sounds did return and she was able  to resume a diabetic diet.  Blood  sugars showed elevations during her time of not being able to have  regular diet and she was started on Lantus insulin.  Eventually, she  returned to her usual medications for her diabetes.  Foley catheter was  discontinued and the patient was able to void without difficulty.  On  June 21, 2008, she was stable for discharge to her home.  At that  time, she was afebrile and vital signs were stable.   PERTINENT LABORATORY VALUES:  Admission CBC with WBC 9.2, RBC 4.92,  hemoglobin 14.6, and hematocrit 44.1.  Postoperatively, hemoglobin and  hematocrit dropped to lowest value of 10.0 and 29.9.  The patient did  not require blood transfusion.   Coagulation studies on admission within normal limits.   Chemistry studies within normal limits throughout the hospital stay with  exception of elevated glucose.  Hemoglobin A1c was done on June 18, 2008, and noted to be 8.5, repeat on June 19, 2008, at 8.4 and  another repeat on June 20, 2008, with 8.4.  In reviewing the chart,  I am not sure why this was done 3 consecutive days as there was no  written order for this lab.  Blood sugars ranged utilizing CBGs from a  value of 239 to the lowest value of 134.   EKG on admission showed normal sinus rhythm, left axis deviation, and  abnormal EKG.  No significant change since last tracing confirmed by Dr.  Cassell Clement.   PLAN:  The patient was discharged to her home.  She was instructed to  wear her brace at all times when out of bed.  She will ambulate  utilizing a walker.  Durable medical equipment was made available for  the patient.  She did not require home health physical therapy  evaluation.  The patient was allowed to shower at home and will change  her dressing daily.  She will resume a diabetic diet.  The patient was  instructed to follow up with Dr. Otelia Sergeant in 2 weeks from the date of  surgery.  She will call to arrange the time.  She  will continue her home  medications as taken prior to admission and medication reconciliation  instructions were given to her for this.  She was given a prescription  for Percocet 5/325 one every 6 hours as needed for pain.  All questions  encouraged and answered.   CONDITION ON DISCHARGE:  Stable.      Wende Neighbors, P.A.      Fayrene Fearing  Lynne Logan, M.D.  Electronically Signed    SMV/MEDQ  D:  07/15/2008  T:  07/15/2008  Job:  [id number]

## 2011-07-18 LAB — HEMOGLOBIN A1C
Hgb A1c MFr Bld: 8.5 — ABNORMAL HIGH
Mean Plasma Glucose: 197

## 2011-07-18 LAB — BASIC METABOLIC PANEL
BUN: 5 — ABNORMAL LOW
Calcium: 8 — ABNORMAL LOW
Calcium: 8 — ABNORMAL LOW
Chloride: 104
Creatinine, Ser: 0.93
GFR calc Af Amer: >60
GFR calc non Af Amer: >60
Glucose, Bld: 175 — ABNORMAL HIGH
Sodium: 136

## 2011-07-18 LAB — DIFFERENTIAL
Basophils Absolute: 0
Lymphocytes Relative: 36
Lymphs Abs: 3.3
Neutro Abs: 5
Neutrophils Relative %: 55

## 2011-07-18 LAB — GLUCOSE, CAPILLARY
Glucose-Capillary: 149 — ABNORMAL HIGH
Glucose-Capillary: 169 — ABNORMAL HIGH
Glucose-Capillary: 173 — ABNORMAL HIGH
Glucose-Capillary: 190 — ABNORMAL HIGH
Glucose-Capillary: 204 — ABNORMAL HIGH
Glucose-Capillary: 230 — ABNORMAL HIGH

## 2011-07-18 LAB — URINALYSIS, ROUTINE W REFLEX MICROSCOPIC
Ketones, ur: NEGATIVE
Protein, ur: NEGATIVE
Urobilinogen, UA: 1

## 2011-07-18 LAB — TYPE AND SCREEN
ABO/RH(D): A POS
Antibody Screen: POSITIVE
DAT, IgG: NEGATIVE
Donor AG Type: NEGATIVE
Donor AG Type: NEGATIVE

## 2011-07-18 LAB — COMPREHENSIVE METABOLIC PANEL
AST: 43 — ABNORMAL HIGH
BUN: 9
CO2: 27
Calcium: 9.6
Creatinine, Ser: 0.73
GFR calc Af Amer: >60
GFR calc non Af Amer: >60
Glucose, Bld: 163 — ABNORMAL HIGH
Total Bilirubin: 0.7

## 2011-07-18 LAB — URINE MICROSCOPIC-ADD ON

## 2011-07-18 LAB — PROTIME-INR
INR: 1
Prothrombin Time: 13.6

## 2011-07-18 LAB — CBC
HCT: 44.1
MCHC: 33.1
MCV: 89.6
RBC: 4.92

## 2011-07-18 LAB — HEMOGLOBIN AND HEMATOCRIT, BLOOD
HCT: 29.9 — ABNORMAL LOW
Hemoglobin: 10.8 — ABNORMAL LOW

## 2011-10-15 ENCOUNTER — Encounter: Payer: Self-pay | Admitting: Cardiology

## 2013-07-23 ENCOUNTER — Other Ambulatory Visit: Payer: Self-pay | Admitting: Specialist

## 2013-07-23 DIAGNOSIS — G4762 Sleep related leg cramps: Secondary | ICD-10-CM

## 2013-08-04 ENCOUNTER — Ambulatory Visit (HOSPITAL_COMMUNITY)
Admission: RE | Admit: 2013-08-04 | Discharge: 2013-08-04 | Disposition: A | Discharge status: Home / Self Care | Payer: Medicare Other | Source: Ambulatory Visit | Attending: Specialist | Admitting: Specialist

## 2013-08-04 DIAGNOSIS — M25659 Stiffness of unspecified hip, not elsewhere classified: Secondary | ICD-10-CM | POA: Insufficient documentation

## 2013-08-04 DIAGNOSIS — IMO0001 Reserved for inherently not codable concepts without codable children: Secondary | ICD-10-CM | POA: Insufficient documentation

## 2013-08-04 DIAGNOSIS — M2569 Stiffness of other specified joint, not elsewhere classified: Secondary | ICD-10-CM | POA: Insufficient documentation

## 2013-08-04 DIAGNOSIS — M25559 Pain in unspecified hip: Secondary | ICD-10-CM | POA: Insufficient documentation

## 2013-08-04 DIAGNOSIS — R29898 Other symptoms and signs involving the musculoskeletal system: Secondary | ICD-10-CM | POA: Insufficient documentation

## 2013-08-04 DIAGNOSIS — M545 Low back pain, unspecified: Secondary | ICD-10-CM | POA: Insufficient documentation

## 2013-08-04 DIAGNOSIS — I10 Essential (primary) hypertension: Secondary | ICD-10-CM | POA: Insufficient documentation

## 2013-08-04 DIAGNOSIS — E119 Type 2 diabetes mellitus without complications: Secondary | ICD-10-CM | POA: Insufficient documentation

## 2013-08-04 NOTE — Evaluation (Signed)
Physical Therapy Evaluation  Patient Details  Name: [name] MRN: [id number] Date of Birth: 1950/10/30  Today's Date: 08/04/2013 Time: 0910-0950 PT Time Calculation (min): 40 min Charge:  evaluation             Visit#: 1 of 8  Re-eval: 09/03/13 Assessment Diagnosis: Rt radiculapathy Surgical Date: 11/15/98 Next MD Visit:  (08/18/2013) Prior Therapy: 2000  Authorization: medicare [employer]     Authorization Visit#: 1 of 8   Past Medical History:  Past Medical History  Diagnosis Date  . Asthma   . Diabetes mellitus   . Hyperlipidemia   . Hypertension   . Low back pain   . Osteoarthritis   . Bronchitis   . Migraine   . Vertigo   . Sciatica   . Varicose veins    Past Surgical History:  Past Surgical History  Procedure Laterality Date  . Cesarean section    . Carpal tunnel release    . Inguinal hernia repair    . Abdominal hysterectomy      Subjective Symptoms/Limitations Symptoms: [name] states that she has had Rt radicular pain for quite a while opting to have surgery in 2000.  She states that her pain never really got better since the surgery.  She states she has constant pain down to her toes.   She is being referred to therapy to try and improve her pain and functional ability. Pertinent History: four back surgeries How long can you sit comfortably?: 10-15 minutes How long can you stand comfortably?: 10 minutes How long can you walk comfortably?: able to walk an hour but has increased pain.   Patient Stated Goals: waking up twice a night. Pain Assessment Currently in Pain?: Yes Pain Score: 7  (least 4/10; worst 10/10) Pain Location: Back Pain Orientation: Right Pain Type: Chronic pain Pain Radiating Towards: radicular Pain Onset: More than a month ago Pain Frequency: Constant Pain Relieving Factors: pain meds Effect of Pain on Daily Activities: increases  Precautions/Restrictions     Balance Screening Balance Screen Has the  patient fallen in the past 6 months: No  Prior Functioning  Prior Function Vocation: Retired Leisure: Hobbies-no Comments: takes care of sisiter  Sensation/Coordination/Flexibility/Functional Tests Functional Tests Functional Tests: foto risk adjusted 41  Assessment RLE Strength Right Hip Flexion: 4/5 Right Hip Extension: 3/5 Right Hip ABduction:  (5-/5) Right Knee Flexion: 5/5 (4-/5) Right Knee Extension: 5/5 Right Ankle Dorsiflexion: 5/5 LLE Strength Left Hip Flexion: 5/5 Left Hip Extension: 3/5 Left Hip ABduction: 5/5 Left Knee Flexion: 5/5 Left Knee Extension: 5/5 Left Ankle Dorsiflexion: 5/5 Lumbar AROM Lumbar Flexion: decreased 50% Lumbar Extension: decreased 20% Lumbar - Right Side Bend: decreased 50% Lumbar - Left Side Bend: decreased 50% Lumbar - Right Rotation: decreased 70% Lumbar - Left Rotation: decreased 70%  Exercise/Treatments    Stretches Active Hamstring Stretch: 2 reps;30 seconds Single Knee to Chest Stretch: 2 reps;30 seconds Lower Trunk Rotation: 5 reps   Supine Ab Set: 10 reps Glut Set: 10 reps  Manual Therapy Manual Therapy: Myofascial release Myofascial Release: to decrease adhesions along B lumbar area.  Physical Therapy Assessment and Plan PT Assessment and Plan Clinical Impression Statement: Pt is a 62 yo female who has had multiple back operations but is still experiencing radicular pain which interferes with her quality of life.  PT will benefit from skilled PT to improve her mobility and strength and decrease her pain. Rehab Potential: Good PT Frequency: Min 2X/week PT Duration: 4 weeks PT Treatment/Interventions:  Therapeutic activities;Therapeutic exercise;Modalities;Manual techniques PT Plan: begin stretching and stabilization program ; manual techniques to decrease adhesions and improve ROM; Pt to be instructred in TENS unit per MD orders.    Goals Home Exercise Program Pt/caregiver will Perform Home Exercise Program: For  increased strengthening PT Goal: Perform Home Exercise Program - Progress: Goal set today PT Short Term Goals Time to Complete Short Term Goals: 2 weeks PT Short Term Goal 1: Pt to be able to sit for 30 minutes with comfort to enjoy a meal PT Short Term Goal 2: Pt Pain to be no higher than a 7/10 80% of the day PT Short Term Goal 3: Pt to be able to stand for 20 minutes to make a quick meal PT Short Term Goal 4: Pt to be able to sleep waking up one time a night only PT Long Term Goals Time to Complete Long Term Goals: 4 weeks PT Long Term Goal 1: I in advance HEP PT Long Term Goal 2: Pt to be able to sit for an hour for travel or enjoy a meal at a restaurant. Long Term Goal 3: Pt to be able to stand for 30-40 minutes to make a more indepth meal Long Term Goal 4: Pt to be able to walk for an hour without increased pain PT Long Term Goal 5: Pt pain level to be no greater than a 5/10 80% of the time  Problem List Patient Active Problem List   Diagnosis Date Noted  . Stiffness of joint, not elsewhere classified, other specified site 08/04/2013  . Leg weakness, bilateral 08/04/2013  . CHEST PAIN UNSPECIFIED 05/10/2009  . BENIGN POSITIONAL VERTIGO 07/30/2007  . VARICOSE VEINS, LOWER EXTREMITIES 07/30/2007  . SCIATICA, CHRONIC 07/30/2007  . DIABETES MELLITUS, TYPE II 11/18/2006  . HYPERLIPIDEMIA 11/18/2006  . MIGRAINE HEADACHE 11/18/2006  . LESION, SCIATIC NERVE 11/18/2006  . HYPERTENSION 11/18/2006  . HYPOTENSION, ORTHOSTATIC 11/18/2006  . BRONCHITIS NOS 11/18/2006  . Other Emphysema 11/18/2006  . ASTHMA 11/18/2006  . OSTEOARTHRITIS 11/18/2006  . LOW BACK PAIN 11/18/2006  . VERTIGO 11/18/2006    PT Plan of Care PT Home Exercise Plan: given  GP Functional Assessment Tool Used: foto  Functional Limitation: Mobility: Walking and moving around Mobility: Walking and Moving Around Current Status (A5409): At least 60 percent but less than 80 percent impaired, limited or  restricted Mobility: Walking and Moving Around Goal Status [phone]): At least 40 percent but less than 60 percent impaired, limited or restricted  [name] 08/04/2013, 11:47 AM  Physician Documentation Your signature is required to indicate approval of the treatment plan as stated above.  Please sign and either send electronically or make a copy of this report for your files and return this physician signed original.   Please mark one 1.__approve of plan  2. ___approve of plan with the following conditions.   ______________________________                                                          _____________________ Physician Signature  Date  

## 2013-08-06 ENCOUNTER — Ambulatory Visit
Admission: RE | Admit: 2013-08-06 | Discharge: 2013-08-06 | Disposition: A | Discharge status: Home / Self Care | Payer: Medicare Other | Source: Ambulatory Visit | Attending: Specialist | Admitting: Specialist

## 2013-08-06 DIAGNOSIS — G4762 Sleep related leg cramps: Secondary | ICD-10-CM

## 2013-08-07 ENCOUNTER — Ambulatory Visit (HOSPITAL_COMMUNITY)
Admission: RE | Admit: 2013-08-07 | Discharge: 2013-08-07 | Disposition: A | Discharge status: Home / Self Care | Payer: Medicare Other | Source: Ambulatory Visit | Attending: Family Medicine | Admitting: Family Medicine

## 2013-08-07 NOTE — Progress Notes (Signed)
Physical Therapy Treatment Patient Details  Name: [name] MRN: [id number] Date of Birth: 10/22/1950  Today's Date: 08/07/2013 Time: 0940-1025 PT Time Calculation (min): 45 min Charge: there ex [phone]; manual 1005-1019 Visit#: 2 of 8  Re-eval: 09/03/13    Authorization: medicare [employer]  Authorization Visit#: 2 of 8   Subjective: Symptoms/Limitations Symptoms: Ms. [name] states that she has tried to do some of her exercises but has been hurting too much Pain Assessment Currently in Pain?: Yes Pain Score: 10-Worst pain ever (pt does not want to go to the ER) Pain Location: Back   Exercise/Treatments  Stretches Active Hamstring Stretch: 2 reps;30 seconds Single Knee to Chest Stretch: 2 reps;30 seconds Lower Trunk Rotation: 5 reps   Supine Ab Set: 10 reps Clam: 5 reps Bent Knee Raise:  (7) Bridge: 5 reps  Manual Therapy Manual Therapy: Myofascial release Myofascial Release: along adhesions and paravertebral mm to decrease adhesions; deep pressure to multifudis mm to decrease tightness  Physical Therapy Assessment and Plan PT Assessment and Plan Clinical Impression Statement: Pt needed multi cuing to complete exercises correctly.  Pt reports pain is a 5/10 at end of session.  Pt ambulated in to department with an antalgic gait but ambulated out of department with normalize gait pattern.  Pt told to work new exercises over the weekend. Pt will benefit from skilled therapeutic intervention in order to improve on the following deficits: Decreased strength;Difficulty walking;Pain;Increased fascial restricitons;Decreased range of motion Rehab Potential: Good PT Frequency: Min 2X/week PT Duration: 4 weeks PT Treatment/Interventions: Therapeutic activities;Therapeutic exercise;Modalities;Manual techniques PT Plan: Pt to begin isometric hip flextion; t-band exercises and side lying exercises.  Assess pt pain at the end of next week to see if patient feels she  needs a Tens unit (pt has order for tens on prescription)    Goals  progressing  Problem List Patient Active Problem List   Diagnosis Date Noted  . Stiffness of joint, not elsewhere classified, other specified site 08/04/2013  . Leg weakness, bilateral 08/04/2013  . CHEST PAIN UNSPECIFIED 05/10/2009  . BENIGN POSITIONAL VERTIGO 07/30/2007  . VARICOSE VEINS, LOWER EXTREMITIES 07/30/2007  . SCIATICA, CHRONIC 07/30/2007  . DIABETES MELLITUS, TYPE II 11/18/2006  . HYPERLIPIDEMIA 11/18/2006  . MIGRAINE HEADACHE 11/18/2006  . LESION, SCIATIC NERVE 11/18/2006  . HYPERTENSION 11/18/2006  . HYPOTENSION, ORTHOSTATIC 11/18/2006  . BRONCHITIS NOS 11/18/2006  . Other Emphysema 11/18/2006  . ASTHMA 11/18/2006  . OSTEOARTHRITIS 11/18/2006  . LOW BACK PAIN 11/18/2006  . VERTIGO 11/18/2006       GP    RUSSELL,CINDY 08/07/2013, 10:40 AM

## 2013-08-11 ENCOUNTER — Ambulatory Visit (HOSPITAL_COMMUNITY)
Admission: RE | Admit: 2013-08-11 | Discharge: 2013-08-11 | Disposition: A | Discharge status: Home / Self Care | Payer: Medicare Other | Source: Ambulatory Visit | Attending: Specialist | Admitting: Specialist

## 2013-08-11 NOTE — Progress Notes (Signed)
Physical Therapy Treatment Patient Details  Name: [name] MRN: [id number] Date of Birth: [birth year]  Today's Date: 08/11/2013 Time: 4098-1191 PT Time Calculation (min): 53 min Charges: Therex x 22'(0952-1014) Manual x 12'(1016-1028) MHP x 12'(1030-1042)  Visit#: 3 of 8  Re-eval: 09/03/13  Authorization: medicare [employer]  Authorization Visit#: 3 of 8   Subjective: Symptoms/Limitations Symptoms: Pt states that her pain is not too bad this morning. Pain Assessment Currently in Pain?: Yes Pain Score: 5  Pain Location: Back Pain Orientation: Right  Exercise/Treatments Supine Ab Set: 10 reps Bridge: 10 reps Straight Leg Raise: 5 reps Isometric Hip Flexion: 5 reps;5 seconds  Modalities Modalities: Moist Heat Manual Therapy Manual Therapy: Other (comment) Myofascial Release: Along adhesions and paravertebral mm to decrease adhesions Other Manual Therapy: Sciatic nerve glides to RLE in supine Moist Heat Therapy Number Minutes Moist Heat: 12 Minutes Moist Heat Location: Other (comment) (Lumbosacral region)  Physical Therapy Assessment and Plan PT Assessment and Plan Clinical Impression Statement: Pt completes therex well after initial cueing and demo. Manual techniques completed to lumbar and right gluteal region to decrease pain and adhesions. MHP applied at end of session to decreased tightness and pain in lumbosacral region. Pt will benefit from skilled therapeutic intervention in order to improve on the following deficits: Decreased strength;Difficulty walking;Pain;Increased fascial restricitons;Decreased range of motion Rehab Potential: Good PT Frequency: Min 2X/week PT Duration: 4 weeks PT Treatment/Interventions: Therapeutic activities;Therapeutic exercise;Modalities;Manual techniques PT Plan: Pt to begin isometric hip flextion; t-band exercises and side lying exercises.  Assess pt pain at the end of next week to see if patient feels she needs a Tens unit  (pt has order for tens on prescription)     Problem List Patient Active Problem List   Diagnosis Date Noted  . Stiffness of joint, not elsewhere classified, other specified site 08/04/2013  . Leg weakness, bilateral 08/04/2013  . CHEST PAIN UNSPECIFIED 05/10/2009  . BENIGN POSITIONAL VERTIGO 07/30/2007  . VARICOSE VEINS, LOWER EXTREMITIES 07/30/2007  . SCIATICA, CHRONIC 07/30/2007  . DIABETES MELLITUS, TYPE II 11/18/2006  . HYPERLIPIDEMIA 11/18/2006  . MIGRAINE HEADACHE 11/18/2006  . LESION, SCIATIC NERVE 11/18/2006  . HYPERTENSION 11/18/2006  . HYPOTENSION, ORTHOSTATIC 11/18/2006  . BRONCHITIS NOS 11/18/2006  . Other Emphysema 11/18/2006  . ASTHMA 11/18/2006  . OSTEOARTHRITIS 11/18/2006  . LOW BACK PAIN 11/18/2006  . VERTIGO 11/18/2006    PT - End of Session Activity Tolerance: Patient tolerated treatment well General Behavior During Therapy: [facility] for tasks assessed/performed  Seth Bake, PTA  08/11/2013, 11:34 AM

## 2013-08-14 ENCOUNTER — Telehealth (HOSPITAL_COMMUNITY): Payer: Self-pay

## 2013-08-14 ENCOUNTER — Ambulatory Visit (HOSPITAL_COMMUNITY): Payer: Medicare Other | Admitting: Physical Therapy

## 2013-08-18 ENCOUNTER — Ambulatory Visit (HOSPITAL_COMMUNITY)
Admission: RE | Admit: 2013-08-18 | Discharge: 2013-08-18 | Disposition: A | Discharge status: Home / Self Care | Payer: Medicare Other | Source: Ambulatory Visit | Attending: Family Medicine | Admitting: Family Medicine

## 2013-08-18 DIAGNOSIS — M25659 Stiffness of unspecified hip, not elsewhere classified: Secondary | ICD-10-CM | POA: Insufficient documentation

## 2013-08-18 DIAGNOSIS — M25559 Pain in unspecified hip: Secondary | ICD-10-CM | POA: Insufficient documentation

## 2013-08-18 DIAGNOSIS — R29898 Other symptoms and signs involving the musculoskeletal system: Secondary | ICD-10-CM | POA: Insufficient documentation

## 2013-08-18 DIAGNOSIS — IMO0001 Reserved for inherently not codable concepts without codable children: Secondary | ICD-10-CM | POA: Insufficient documentation

## 2013-08-18 DIAGNOSIS — M545 Low back pain, unspecified: Secondary | ICD-10-CM | POA: Insufficient documentation

## 2013-08-18 DIAGNOSIS — I10 Essential (primary) hypertension: Secondary | ICD-10-CM | POA: Insufficient documentation

## 2013-08-18 DIAGNOSIS — M2569 Stiffness of other specified joint, not elsewhere classified: Secondary | ICD-10-CM

## 2013-08-18 DIAGNOSIS — E119 Type 2 diabetes mellitus without complications: Secondary | ICD-10-CM | POA: Insufficient documentation

## 2013-08-18 NOTE — Progress Notes (Signed)
Physical Therapy Treatment Patient Details  Name: [name] MRN: [id number] Date of Birth: 03-07-1951  Today's Date: 08/18/2013 Time: 0930-1015 PT Time Calculation (min): 45 min Charge there ex 4098-119; manual [phone] Visit#: 4 of 8  Re-eval: 09/03/13    Authorization: medicare [employer]     Authorization Visit#: 4 of 8   Subjective: Symptoms/Limitations Symptoms: Pt states that her back is doing much better over all.  No radicular pain only pain in back on Rt side greater than LT.  Therapist questioned pt on whether she felt she  needed the TENS unit but pt feels she has improved enough that she does not need the TENS at this time. Pertinent History: four back surgeries Pain Assessment Currently in Pain?: Yes Pain Score: 5  Pain Location: Back Pain Orientation: Right  Exercise/Treatments      Stretches Active Hamstring Stretch: 2 reps;30 seconds Single Knee to Chest Stretch: 2 reps;30 seconds Lower Trunk Rotation: 5 reps   Supine Bent Knee Raise: 10 reps Bridge: 10 reps Straight Leg Raise: 5 reps;Limitations Straight Leg Raises Limitations: floating   Prone  Single Arm Raise: 5 reps Straight Leg Raise: 10 reps Other Prone Lumbar Exercises: heelsqueeze x 10  Manual Therapy Manual Therapy: Myofascial release Myofascial Release: Lumbar paraspinal mm to decrease adhesions and improve flexibilty.  Physical Therapy Assessment and Plan PT Assessment and Plan Clinical Impression Statement: Pt improving in technique of exercise and is more aware of lumbar positioning.  Began prone exercises today to improve strength of back and glut mm. PT Frequency: Min 2X/week PT Treatment/Interventions: Therapeutic activities;Therapeutic exercise;Modalities;Manual techniques PT Plan: begin t-band exercises as well as SL next treatment    Goals  progressing  Problem List Patient Active Problem List   Diagnosis Date Noted  . Stiffness of joint, not elsewhere  classified, other specified site 08/04/2013  . Leg weakness, bilateral 08/04/2013  . CHEST PAIN UNSPECIFIED 05/10/2009  . BENIGN POSITIONAL VERTIGO 07/30/2007  . VARICOSE VEINS, LOWER EXTREMITIES 07/30/2007  . SCIATICA, CHRONIC 07/30/2007  . DIABETES MELLITUS, TYPE II 11/18/2006  . HYPERLIPIDEMIA 11/18/2006  . MIGRAINE HEADACHE 11/18/2006  . LESION, SCIATIC NERVE 11/18/2006  . HYPERTENSION 11/18/2006  . HYPOTENSION, ORTHOSTATIC 11/18/2006  . BRONCHITIS NOS 11/18/2006  . Other Emphysema 11/18/2006  . ASTHMA 11/18/2006  . OSTEOARTHRITIS 11/18/2006  . LOW BACK PAIN 11/18/2006  . VERTIGO 11/18/2006    General Behavior During Therapy: [facility] for tasks assessed/performed  GP    RUSSELL,CINDY 08/18/2013, 11:31 AM

## 2013-08-19 ENCOUNTER — Telehealth (HOSPITAL_COMMUNITY): Payer: Self-pay

## 2013-08-20 ENCOUNTER — Ambulatory Visit (HOSPITAL_COMMUNITY): Payer: Medicare Other | Admitting: Physical Therapy

## 2013-08-25 ENCOUNTER — Ambulatory Visit (HOSPITAL_COMMUNITY): Payer: Medicare Other | Admitting: Physical Therapy

## 2013-08-28 ENCOUNTER — Ambulatory Visit (HOSPITAL_COMMUNITY): Payer: Medicare Other | Admitting: Physical Therapy

## 2015-03-07 ENCOUNTER — Ambulatory Visit: Payer: Medicare HMO | Admitting: Neurology

## 2015-04-11 ENCOUNTER — Ambulatory Visit (INDEPENDENT_AMBULATORY_CARE_PROVIDER_SITE_OTHER): Payer: Medicare HMO | Admitting: Neurology

## 2015-04-11 ENCOUNTER — Encounter: Payer: Self-pay | Admitting: Neurology

## 2015-04-11 VITALS — BP 134/68 | HR 92 | Resp 16 | Ht 65.0 in | Wt 190.0 lb

## 2015-04-11 DIAGNOSIS — E669 Obesity, unspecified: Secondary | ICD-10-CM

## 2015-04-11 DIAGNOSIS — E1165 Type 2 diabetes mellitus with hyperglycemia: Secondary | ICD-10-CM | POA: Diagnosis not present

## 2015-04-11 DIAGNOSIS — M79604 Pain in right leg: Secondary | ICD-10-CM

## 2015-04-11 DIAGNOSIS — IMO0002 Reserved for concepts with insufficient information to code with codable children: Secondary | ICD-10-CM

## 2015-04-11 NOTE — Progress Notes (Signed)
Subjective:    Patient ID: [name] is a 64 y.o. female.  HPI     Huston FoleySaima Kately Graffam, MD, PhD [facility] [address], [address] [address] [place], [place] [address]  Dear Dr. Otelia SergeantNitka,   I saw your patient, [name], upon your kind request in my neurologic clinic today for consultation of her right leg pain. The patient is unaccompanied by today. As you know, [name] is a 64 year old right-handed woman with an underlying medical history of diabetes with history of possible diabetic neuropathy low back pain, lumbar spinal stenosis, status post laminectomy, L3-4 and L4-5 with decompression of bilateral L3, bilateral L4 and bilateral L5 nerve roots on 11/26/2006, who reports persistent right leg pain. I reviewed your office note from 02/23/2015. MRI lumbar spine in October 2014 did not show any significant nerve compression. She also had EMG nerve conduction testing which in the past showed L3 radiculopathy and the latest study was is October 2015. For symptomatic treatment she has tried tizanidine. More recently you prescribed amitriptyline. You are particularly worried about diabetic amyotrophy. Her DM is not very well controlled, she admits. She has had multiple back surgeries. She feels that the addition of amitriptyline which is currently at 10 mg once daily has helped. She also takes Vicodin 1 pill once or twice daily as needed. She had blood work with her primary care physician on 02/28/2015. While test results are not available for my review today, she recalls that her hemoglobin A1c was around 11. She was started on a new diabetes medication at the time. Her typical finger stick blood sugar values first thing in the morning are around 220.   Her Past Medical History Is Significant For: Past Medical History  Diagnosis Date  . Asthma   . Diabetes mellitus   . Hyperlipidemia   . Hypertension   . Low back pain   . Osteoarthritis   .  Bronchitis   . Migraine   . Vertigo   . Sciatica   . Varicose veins     Her Past Surgical History Is Significant For: Past Surgical History  Procedure Laterality Date  . Cesarean section    . Carpal tunnel release    . Inguinal hernia repair    . Abdominal hysterectomy    . Lumbar fusion      Her Family History Is Significant For: Family History  Problem Relation Age of Onset  . Lung cancer Mother   . Stroke Father   . Heart failure Sister   . Heart failure Sister     Her Social History Is Significant For: History   Social History  . Marital Status: Single    Spouse Name: N/A  . Number of Children: N/A  . Years of Education: 12th    Occupational History  . Disabled    Social History Main Topics  . Smoking status: Never Smoker   . Smokeless tobacco: Not on file  . Alcohol Use: No  . Drug Use: No  . Sexual Activity: Not on file   Other Topics Concern  . None   Social History Narrative   No caffeine use     Her Allergies Are:  No Known Allergies:   Her Current Medications Are:  Outpatient Encounter Prescriptions as of 04/11/2015  Medication Sig  . Alogliptin-Metformin HCl 12.5-500 MG TABS Take by mouth.  Marland Kitchen. amitriptyline (ELAVIL) 10 MG tablet   . amLODipine (NORVASC) 5 MG tablet Take 5 mg by mouth daily.    .Marland Kitchen  hydrochlorothiazide (MICROZIDE) 12.5 MG capsule Take 12.5 mg by mouth daily.  Marland Kitchen HYDROcodone-acetaminophen (VICODIN) 5-500 MG per tablet Take 1 tablet by mouth every 4 (four) hours as needed.    . insulin NPH-insulin regular (NOVOLIN 70/30) (70-30) 100 UNIT/ML injection Inject 54 Units into the skin every morning.    Marland Kitchen losartan (COZAAR) 50 MG tablet Take 50 mg by mouth daily.  . meloxicam (MOBIC) 15 MG tablet   . metFORMIN (GLUCOPHAGE) 1000 MG tablet Take 1,000 mg by mouth 2 (two) times daily with a meal.    . methocarbamol (ROBAXIN) 500 MG tablet Take 500 mg by mouth 4 (four) times daily.  Marland Kitchen omeprazole (PRILOSEC) 20 MG capsule Take 20 mg by mouth  daily.    . simvastatin (ZOCOR) 20 MG tablet Take 20 mg by mouth at bedtime.    . [DISCONTINUED] lisinopril (PRINIVIL,ZESTRIL) 20 MG tablet Take 20 mg by mouth daily.    . [DISCONTINUED] lovastatin (MEVACOR) 20 MG tablet Take 20 mg by mouth at bedtime.    . [DISCONTINUED] ampicillin (PRINCIPEN) 250 MG capsule Take 250 mg by mouth 4 (four) times daily.     No facility-administered encounter medications on file as of 04/11/2015.  :   Review of Systems:  Out of a complete 14 point review of systems, all are reviewed and negative with the exception of these symptoms as listed below:  Review of Systems  Constitutional:       Weight gain, weight loss  Neurological:       R leg pain and weakness. Pain starts from back into toes. Numbness.     Objective:  Neurologic Exam  Physical Exam Physical Examination:   Filed Vitals:   04/11/15 1321  BP: 134/68  Pulse: 92  Resp: 16   General Examination: The patient is a very pleasant 64 y.o. female in no acute distress. She appears well-developed and well-nourished and well groomed.   HEENT: Normocephalic, atraumatic, pupils are equal, round and reactive to light and accommodation. Funduscopic exam is normal with sharp disc margins noted. Extraocular tracking is good without limitation to gaze excursion or nystagmus noted. Normal smooth pursuit is noted. Hearing is grossly intact. Tympanic membranes are clear bilaterally. Face is symmetric with normal facial animation and normal facial sensation. Speech is clear with no dysarthria noted. There is no hypophonia. There is no lip, neck/head, jaw or voice tremor. Neck is supple with full range of passive and active motion. There are no carotid bruits on auscultation. Oropharynx exam reveals: moderate mouth dryness, adequate dental hygiene and upper and lower dentures in place, moderate airway crowding secondary to larger tongue.  Chest: Clear to auscultation without wheezing, rhonchi or crackles  noted.  Heart: S1+S2+0, regular and normal without murmurs, rubs or gallops noted.   Abdomen: Soft, non-tender and non-distended with normal bowel sounds appreciated on auscultation.  Extremities: There is no pitting edema in the distal lower extremities bilaterally. Pedal pulses are intact.  Skin: Warm and dry without trophic changes noted. There are no varicose veins.  Musculoskeletal: exam reveals no obvious joint deformities, tenderness or joint swelling or erythema. She is not particularly tender to palpation in her thigh muscles. I did not appreciate any fasciculations in her calves or thigh muscles. She has no focal atrophy.   Neurologically:  Mental status: The patient is awake, alert and oriented in all 4 spheres. Her immediate and remote memory, attention, language skills and fund of knowledge are appropriate. There is no evidence of aphasia, agnosia, apraxia  or anomia. Speech is clear with normal prosody and enunciation. Thought process is linear. Mood is normal and affect is normal.  Cranial nerves II - XII are as described above under HEENT exam. In addition: shoulder shrug is normal with equal shoulder height noted. Motor exam: Normal bulk, strength and tone is noted. There is no drift, tremor or rebound. Romberg is negative. Reflexes are 1+ in the upper extremities, trace in both knees and absent in both ankles. Babinski: Toes are flexor bilaterally. Fine motor skills and coordination: intact with normal finger taps and foot taps.  Cerebellar testing: No dysmetria or intention tremor on finger to nose testing. Heel to shin is unremarkable bilaterally, however, she does grimace when doing the right side. There is no truncal or gait ataxia.  Sensory exam: intact to light touch, pinprick, vibration, temperature sense in the upper and lower extremities, with the exception of mild decrease in vibration sense in the right foot and possible decrease of pinprick sensation in the right foot.   Gait, station and balance: She stands with difficulty due to pain. She walks with a slight limp on the right.  Assessment and Plan:   Assessment and Plan:  In summary, [name] is a very pleasant 64 y.o.-year old femalewith an underlying medical history of diabetes with history of possible diabetic neuropathy, low back pain, lumbar spinal stenosis, status post laminectomy, and several back surgeries, who reports persistent right leg pain. Her exam is fairly benign appearing. She has decreased pinprick sensation and vibration sense in her right foot which is not new. She has decreased reflex activity in both lower extremities, not unusual for diabetics especially in light of suboptimal diabetes control. She has found symptomatic relief of her pain with amitriptyline. She is encouraged to follow with you for symptomatic treatment of her pain. I suggested we repeat her EMG nerve conduction test and we will do additional blood work. I can probably see her back as needed depending on the test results and we will call her with all her results. In particular, I did not find that she was sensitive to muscle palpation. There was no focal atrophy, there was no fasciculation, reflexes were symmetrical, all of which were reassuring findings. I explained my findings to the patient and she was agreeable with the plan. For diabetes management she has been started on a new medication last month and has a follow-up with her primary care physician. Tight diabetes control and weight loss are recommended.   Thank you very much for allowing me to participate in the care of this nice patient. If I can be of any further assistance to you please do not hesitate to call me at [phone].  Sincerely,   Huston Foley, MD, PhD

## 2015-04-11 NOTE — Patient Instructions (Signed)
We will repeat the EMG and nerve conduction test and call you with the results.  We will do some blood work today and call you.  You can see Dr. Otelia Sergeant in follow up.

## 2015-04-12 ENCOUNTER — Telehealth: Payer: Self-pay | Admitting: Neurology

## 2015-04-12 LAB — HGB A1C W/O EAG: HEMOGLOBIN A1C: 10.6 % — AB (ref 4.8–5.6)

## 2015-04-12 LAB — TSH: TSH: 0.443 u[IU]/mL — AB (ref 0.450–4.500)

## 2015-04-12 LAB — CK: Total CK: 142 U/L (ref 24–173)

## 2015-04-12 LAB — B12 AND FOLATE PANEL
Folate: 14 ng/mL (ref >3.0)
Vitamin B-12: 154 pg/mL — ABNORMAL LOW (ref 211–946)

## 2015-04-12 NOTE — Telephone Encounter (Signed)
Please call patient with regards to her blood test results. Her hemoglobin A1c was elevated at 10.6. This typically indicates uncontrolled diabetes. Her vitamin B12 level was low as well. Her thyroid function screening test called TSH was borderline low as well. This could indicate over function of her thyroid gland.I would highly recommend the patient follow-up with her primary care physician as soon as possible to discuss the following: #1: Diabetes control and optimization as well as weight management. #2: Vitamin B12 deficiency and treatment of this with follow-up labs necessary. #3: thyroid function reevaluation and follow-up.   Please also fax results to her primary care physician's office so he is in the loop as well.

## 2015-04-13 ENCOUNTER — Ambulatory Visit (INDEPENDENT_AMBULATORY_CARE_PROVIDER_SITE_OTHER): Payer: Medicare HMO | Admitting: Neurology

## 2015-04-13 DIAGNOSIS — M5417 Radiculopathy, lumbosacral region: Secondary | ICD-10-CM

## 2015-04-13 DIAGNOSIS — M79604 Pain in right leg: Secondary | ICD-10-CM

## 2015-04-13 DIAGNOSIS — Z0289 Encounter for other administrative examinations: Secondary | ICD-10-CM

## 2015-04-13 DIAGNOSIS — E669 Obesity, unspecified: Secondary | ICD-10-CM

## 2015-04-13 DIAGNOSIS — IMO0002 Reserved for concepts with insufficient information to code with codable children: Secondary | ICD-10-CM

## 2015-04-13 DIAGNOSIS — E1165 Type 2 diabetes mellitus with hyperglycemia: Secondary | ICD-10-CM

## 2015-04-13 DIAGNOSIS — E0844 Diabetes mellitus due to underlying condition with diabetic amyotrophy: Secondary | ICD-10-CM

## 2015-04-13 NOTE — Telephone Encounter (Signed)
I spoke to patient and [name] is aware of results and recommendations. [name] will call Dr. Loleta ChanceHill to make appt and I will fax report to him.

## 2015-04-13 NOTE — Progress Notes (Signed)
See procedure note.

## 2015-04-13 NOTE — Progress Notes (Signed)
  [facility]  Provider: Dr Lucia Gaskins Referring Provider: Babs Sciara, MD Primary Care Physician: Lilyan Punt, MD   History: Patient is a 64 year old female with a PMHx of diabetes, low back pain, lumbar spinal stenosis, status post laminectomy, L3-4 and L4-5 with decompression of bilateral L3, bilateral L4 and bilateral L5 nerve roots on 11/26/2006, who reports persistent right leg pain.    Summary  Nerve conduction studies were performed on the bilateral lower extremities:  The right Peroneal motor nerve showed normal conductions with normal F Wave latency The left Peroneal motor nerve showed normal conductions with normal F Wave latency  The right Tibial motor nerve showed decreased amplitude(0.27mV,N>3) and delayed F Wave latency(31ms,N<58). The left Tibial motor nerve showed normal conductions with normal F Wave latency The bilateral Sural sensory  nerves showed delayed distal peak latency(4.65ms, N< 4.8) Bilateral H Reflexes showed no response  EMG Needle study was performed on selected right lower extremity muscles. Could not perform paraspinal examination as those muscles are unreliable after surgery:  The following right lower extremity muscles were normal: Iliopsoas, Adductor Magnus, Gluteus Maximus, Gluteus Medius, Biceps Femoris (long head), Vastus Medialis and Vastus Lateralis, Anterior Tibialis, Medial Gastrocnemius, Extensor Hallucis Longus.  Conclusion: Decreased amplitude of the right Tibial motor nerve can be seen in L5/S1 nerve root involvement. However there were no changes on EMG needle study to confirm acute/ongoing right radiculopathy. No suggestion of L3 radiculopathy or diabetic amyotrophy. There is also concomitant peripheral neuropathy. Clinical correlation recommended.   Angela Dean, MD  [facility] [address] [place], [place] [address]  Phone [phone] Fax [phone]

## 2015-04-19 NOTE — Procedures (Signed)
[facility]  Provider: Dr Lucia GaskinsAhern Referring Provider: Babs SciaraLuking, Scott A, MD Primary Care Physician: Lilyan PuntScott Luking, MD   History: Patient is a 64 year old female with a PMHx of diabetes, low back pain, lumbar spinal stenosis, status post laminectomy, L3-4 and L4-5 with decompression of bilateral L3, bilateral L4 and bilateral L5 nerve roots on 11/26/2006, who reports persistent right leg pain.    Summary  Nerve conduction studies were performed on the bilateral lower extremities:  The right Peroneal motor nerve showed normal conductions with normal F Wave latency The left Peroneal motor nerve showed normal conductions with normal F Wave latency  The right Tibial motor nerve showed decreased amplitude(0.713mV,N>3) and delayed F Wave latency(2760ms,N<58). The left Tibial motor nerve showed normal conductions with normal F Wave latency The bilateral Sural sensory  nerves showed delayed distal peak latency(4.579ms, N< 4.8) Bilateral H Reflexes showed no response  EMG Needle study was performed on selected right lower extremity muscles. Could not perform paraspinal examination as those muscles are unreliable after surgery:  The following right lower extremity muscles were normal: Iliopsoas, Adductor Magnus, Gluteus Maximus, Gluteus Medius, Biceps Femoris (long head), Vastus Medialis and Vastus Lateralis, Anterior Tibialis, Medial Gastrocnemius, Extensor Hallucis Longus.  Conclusion: Decreased amplitude of the right Tibial motor nerve can be seen in L5/S1 nerve root involvement. However there were no changes on EMG needle study to confirm acute/ongoing right radiculopathy. No suggestion of L3 radiculopathy or diabetic amyotrophy. There is also concomitant peripheral neuropathy. Clinical correlation recommended.   Naomie DeanAntonia Ahern, MD  [facility] [address] [place], [place] [address]  Phone [phone] Fax [phone]

## 2015-05-25 ENCOUNTER — Telehealth: Payer: Self-pay

## 2015-05-25 NOTE — Progress Notes (Signed)
Quick Note:  Please call and advise the patient that the recent EMG and nerve conduction velocity test, which is the electrical nerve and muscle test we we performed, was reported as mildly abnormal. No clear explanation of her symptoms, there was mild evidence of neuropathy (likely from diabetes), and possible nerve root compression on the right, coming from the the back (i.e. Pinched nerve), but findings overall not very clear cut. We can make a FU appointment to discuss all of this. Thanks,  Huston Foley, MD, PhD   ______

## 2015-05-25 NOTE — Telephone Encounter (Signed)
I spoke to patient and she is aware of results. She states that she is having some other health issues and wants to call back to schedule appt.

## 2015-05-25 NOTE — Telephone Encounter (Signed)
-----   Message from Huston Foley, MD sent at 05/25/2015  8:22 AM EDT ----- Please call and advise the patient that the recent EMG and nerve conduction velocity test, which is the electrical nerve and muscle test we we performed, was reported as mildly abnormal. No clear explanation of her symptoms, there was mild evidence of neuropathy (likely from diabetes), and possible nerve root compression on the right, coming from the the back (i.e. Pinched nerve), but findings overall not very clear cut. We can make a FU appointment to discuss all of this. Thanks,  Huston Foley, MD, PhD

## 2015-09-30 ENCOUNTER — Other Ambulatory Visit: Payer: Self-pay | Admitting: Specialist

## 2015-09-30 DIAGNOSIS — M545 Low back pain, unspecified: Secondary | ICD-10-CM

## 2015-09-30 DIAGNOSIS — G8929 Other chronic pain: Secondary | ICD-10-CM

## 2015-10-13 ENCOUNTER — Ambulatory Visit
Admission: RE | Admit: 2015-10-13 | Discharge: 2015-10-13 | Disposition: A | Discharge status: Home / Self Care | Payer: Medicare HMO | Source: Ambulatory Visit | Attending: Specialist | Admitting: Specialist

## 2015-10-13 VITALS — BP 164/68 | HR 98

## 2015-10-13 DIAGNOSIS — M545 Low back pain, unspecified: Secondary | ICD-10-CM

## 2015-10-13 DIAGNOSIS — R29898 Other symptoms and signs involving the musculoskeletal system: Secondary | ICD-10-CM

## 2015-10-13 DIAGNOSIS — G8929 Other chronic pain: Secondary | ICD-10-CM

## 2015-10-13 MED ORDER — IOHEXOL 180 MG/ML  SOLN
15.0000 mL | Freq: Once | INTRAMUSCULAR | Status: AC | PRN
  Administered 2015-10-13: 15 mL via INTRATHECAL

## 2015-10-13 MED ORDER — ONDANSETRON HCL 4 MG/2ML IJ SOLN
4.0000 mg | Freq: Once | INTRAMUSCULAR | Status: AC
  Administered 2015-10-13: 4 mg via INTRAMUSCULAR

## 2015-10-13 MED ORDER — MEPERIDINE HCL 100 MG/ML IJ SOLN
75.0000 mg | Freq: Once | INTRAMUSCULAR | Status: AC
  Administered 2015-10-13: 75 mg via INTRAMUSCULAR

## 2015-10-13 MED ORDER — DIAZEPAM 5 MG PO TABS
5.0000 mg | ORAL_TABLET | Freq: Once | ORAL | Status: AC
  Administered 2015-10-13: 5 mg via ORAL

## 2015-10-13 NOTE — Progress Notes (Signed)
Pt states she has been off Amitriptyline for the past 2 days.  Discharge instructions explained to pt. 

## 2015-10-13 NOTE — Discharge Instructions (Signed)
Myelogram Discharge Instructions  1. Go home and rest quietly for the next 24 hours.  It is important to lie flat for the next 24 hours.  Get up only to go to the restroom.  You may lie in the bed or on a couch on your back, your stomach, your left side or your right side.  You may have one pillow under your head.  You may have pillows between your knees while you are on your side or under your knees while you are on your back.  2. DO NOT drive today.  Recline the seat as far back as it will go, while still wearing your seat belt, on the way home.  3. You may get up to go to the bathroom as needed.  You may sit up for 10 minutes to eat.  You may resume your normal diet and medications unless otherwise indicated.  Drink lots of extra fluids today and tomorrow.  4. The incidence of headache, nausea, or vomiting is about 5% (one in 20 patients).  If you develop a headache, lie flat and drink plenty of fluids until the headache goes away.  Caffeinated beverages may be helpful.  If you develop severe nausea and vomiting or a headache that does not go away with flat bed rest, call [phone].  5. You may resume normal activities after your 24 hours of bed rest is over; however, do not exert yourself strongly or do any heavy lifting tomorrow. If when you get up you have a headache when standing, go back to bed and force fluids for another 24 hours.  6. Call your physician for a follow-up appointment.  The results of your myelogram will be sent directly to your physician by the following day.  7. If you have any questions or if complications develop after you arrive home, please call [phone].  Discharge instructions have been explained to the patient.  The patient, or the person responsible for the patient, fully understands these instructions.       May resume Amitriptyline on Dec. 30, 2016, after 1:00 pm.

## 2016-08-25 ENCOUNTER — Emergency Department (HOSPITAL_COMMUNITY)
Admission: EM | Admit: 2016-08-25 | Discharge: 2016-08-25 | Disposition: A | Discharge status: Home / Self Care | Payer: Medicare HMO | Attending: Emergency Medicine | Admitting: Emergency Medicine

## 2016-08-25 ENCOUNTER — Encounter (HOSPITAL_COMMUNITY): Payer: Self-pay | Admitting: Emergency Medicine

## 2016-08-25 DIAGNOSIS — J45909 Unspecified asthma, uncomplicated: Secondary | ICD-10-CM | POA: Insufficient documentation

## 2016-08-25 DIAGNOSIS — I10 Essential (primary) hypertension: Secondary | ICD-10-CM | POA: Diagnosis not present

## 2016-08-25 DIAGNOSIS — Y929 Unspecified place or not applicable: Secondary | ICD-10-CM | POA: Diagnosis not present

## 2016-08-25 DIAGNOSIS — W268XXA Contact with other sharp object(s), not elsewhere classified, initial encounter: Secondary | ICD-10-CM | POA: Insufficient documentation

## 2016-08-25 DIAGNOSIS — Z794 Long term (current) use of insulin: Secondary | ICD-10-CM | POA: Diagnosis not present

## 2016-08-25 DIAGNOSIS — E119 Type 2 diabetes mellitus without complications: Secondary | ICD-10-CM | POA: Insufficient documentation

## 2016-08-25 DIAGNOSIS — Y999 Unspecified external cause status: Secondary | ICD-10-CM | POA: Insufficient documentation

## 2016-08-25 DIAGNOSIS — S61012A Laceration without foreign body of left thumb without damage to nail, initial encounter: Secondary | ICD-10-CM | POA: Diagnosis present

## 2016-08-25 DIAGNOSIS — Z79899 Other long term (current) drug therapy: Secondary | ICD-10-CM | POA: Diagnosis not present

## 2016-08-25 DIAGNOSIS — Z23 Encounter for immunization: Secondary | ICD-10-CM | POA: Diagnosis not present

## 2016-08-25 DIAGNOSIS — Y9389 Activity, other specified: Secondary | ICD-10-CM | POA: Diagnosis not present

## 2016-08-25 MED ORDER — TETANUS-DIPHTH-ACELL PERTUSSIS 5-2.5-18.5 LF-MCG/0.5 IM SUSP
0.5000 mL | Freq: Once | INTRAMUSCULAR | Status: AC
  Administered 2016-08-25: 0.5 mL via INTRAMUSCULAR
  Filled 2016-08-25: qty 0.5

## 2016-08-25 MED ORDER — LIDOCAINE HCL (PF) 2 % IJ SOLN
2.0000 mL | Freq: Once | INTRAMUSCULAR | Status: DC
  Filled 2016-08-25: qty 10

## 2016-08-25 NOTE — ED Triage Notes (Signed)
Pt states that she was taking trash outside and her left thumb slipped and hit a can.  Pt does not know when her last tetanus immunization was.

## 2016-08-25 NOTE — ED Provider Notes (Signed)
[facility] DEPT Provider Note   CSN: [id number] Arrival date & time: 08/25/16  1829     History   Chief Complaint Chief Complaint  Patient presents with  . Laceration    left thumb    HPI [name] is a 65 y.o. female presenting for treatment of laceration.  She is a right handed patient who cut her left thumb on a vegetable can edge while taking out the garbage just prior to arrival.  She has applied pressure but the wound continues to bleed. She denies numbness distal to the injury site.  Her tetanus status is unknown..  The history is provided by the patient.    Past Medical History:  Diagnosis Date  . Asthma   . Bronchitis   . Diabetes mellitus   . Hyperlipidemia   . Hypertension   . Low back pain   . Migraine   . Osteoarthritis   . Sciatica   . Varicose veins   . Vertigo     Patient Active Problem List   Diagnosis Date Noted  . Stiffness of joint, not elsewhere classified, other specified site 08/04/2013  . Leg weakness, bilateral 08/04/2013  . CHEST PAIN UNSPECIFIED 05/10/2009  . BENIGN POSITIONAL VERTIGO 07/30/2007  . VARICOSE VEINS, LOWER EXTREMITIES 07/30/2007  . SCIATICA, CHRONIC 07/30/2007  . DIABETES MELLITUS, TYPE II 11/18/2006  . HYPERLIPIDEMIA 11/18/2006  . MIGRAINE HEADACHE 11/18/2006  . LESION, SCIATIC NERVE 11/18/2006  . HYPERTENSION 11/18/2006  . HYPOTENSION, ORTHOSTATIC 11/18/2006  . BRONCHITIS NOS 11/18/2006  . Other emphysema (HCC) 11/18/2006  . ASTHMA 11/18/2006  . OSTEOARTHRITIS 11/18/2006  . LOW BACK PAIN 11/18/2006  . VERTIGO 11/18/2006    Past Surgical History:  Procedure Laterality Date  . ABDOMINAL HYSTERECTOMY    . CARPAL TUNNEL RELEASE    . CESAREAN SECTION    . INGUINAL HERNIA REPAIR    . LUMBAR FUSION      OB History    No data available       Home Medications    Prior to Admission medications   Medication Sig Start Date End Date Taking? Authorizing Provider  Alogliptin-Metformin HCl  12.5-500 MG TABS Take by mouth.    Historical Provider, MD  amitriptyline (ELAVIL) 10 MG tablet  02/14/15   Historical Provider, MD  amLODipine (NORVASC) 5 MG tablet Take 5 mg by mouth daily.      Historical Provider, MD  hydrochlorothiazide (MICROZIDE) 12.5 MG capsule Take 12.5 mg by mouth daily.    Historical Provider, MD  HYDROcodone-acetaminophen (VICODIN) 5-500 MG per tablet Take 1 tablet by mouth every 4 (four) hours as needed.      Historical Provider, MD  insulin NPH-insulin regular (NOVOLIN 70/30) (70-30) 100 UNIT/ML injection Inject 54 Units into the skin every morning.      Historical Provider, MD  losartan (COZAAR) 50 MG tablet Take 50 mg by mouth daily.    Historical Provider, MD  meloxicam (MOBIC) 15 MG tablet  02/13/15   Historical Provider, MD  metFORMIN (GLUCOPHAGE) 1000 MG tablet Take 1,000 mg by mouth 2 (two) times daily with a meal.      Historical Provider, MD  methocarbamol (ROBAXIN) 500 MG tablet Take 500 mg by mouth 4 (four) times daily.    Historical Provider, MD  omeprazole (PRILOSEC) 20 MG capsule Take 20 mg by mouth daily.      Historical Provider, MD  simvastatin (ZOCOR) 20 MG tablet Take 20 mg by mouth at bedtime.      Historical  Provider, MD    Family History Family History  Problem Relation Age of Onset  . Lung cancer Mother   . Stroke Father   . Heart failure Sister   . Heart failure Sister     Social History Social History  Substance Use Topics  . Smoking status: Never Smoker  . Smokeless tobacco: Never Used  . Alcohol use No     Allergies   Patient has no known allergies.   Review of Systems Review of Systems  Constitutional: Negative for chills and fever.  Respiratory: Negative.   Cardiovascular: Negative.   Skin: Positive for wound.  Neurological: Negative for numbness.     Physical Exam Updated Vital Signs BP 139/65 (BP Location: Right Arm)   Pulse 94   Temp 99.5 F (37.5 C) (Oral)   Resp 16   Ht 5\' 5"  (1.651 m)   Wt 89.8 kg    SpO2 96%   BMI 32.95 kg/m   Physical Exam  Constitutional: She is oriented to person, place, and time. She appears well-developed and well-nourished.  HENT:  Head: Normocephalic.  Cardiovascular: Normal rate.   Pulmonary/Chest: Effort normal.  Musculoskeletal: She exhibits tenderness.  Neurological: She is alert and oriented to person, place, and time. No sensory deficit.  Skin: Laceration noted.  2 cm subcutaneous laceration volar proximal phalanx of left thumb.  Currently hemostatic.  Distal sensation intact.     ED Treatments / Results  Labs (all labs ordered are listed, but only abnormal results are displayed) Labs Reviewed - No data to display  EKG  EKG Interpretation None       Radiology No results found.  Procedures Procedures (including critical care time)  LACERATION REPAIR Performed by: Burgess AmorIDOL, Delmi Fulfer Authorized by: Burgess AmorIDOL, Shahram Alexopoulos Consent: Verbal consent obtained. Risks and benefits: risks, benefits and alternatives were discussed Consent given by: patient Patient identity confirmed: provided demographic data Prepped and Draped in normal sterile fashion Wound explored  Laceration Location: left thumb  Laceration Length: 2 cm  No Foreign Bodies seen or palpated  Anesthesia: local infiltration  Local anesthetic: lidocaine 2% without epinephrine  Anesthetic total: 1 ml  Irrigation method: syringe Amount of cleaning: standard  Skin closure: ethilon 4-0  Number of sutures: 5  Technique: simple interupted  Patient tolerance: Patient tolerated the procedure well with no immediate complications.   Medications Ordered in ED Medications  Tdap (BOOSTRIX) injection 0.5 mL (0.5 mLs Intramuscular Given 08/25/16 2042)     Initial Impression / Assessment and Plan / ED Course  I have reviewed the triage vital signs and the nursing notes.  Pertinent labs & imaging results that were available during my care of the patient were reviewed by me and  considered in my medical decision making (see chart for details).  Clinical Course     Wound care instructions given.  Pt advised to have sutures removed in 10 days,  Return here sooner for any signs of infection including redness, swelling, worse pain or drainage of pus. Tetanus updated.     Final Clinical Impressions(s) / ED Diagnoses   Final diagnoses:  Laceration of left thumb without foreign body without damage to nail, initial encounter    New Prescriptions Discharge Medication List as of 08/25/2016  8:25 PM       Burgess AmorJulie Justene Jensen, PA-C 08/26/16 1349    Samuel JesterKathleen McManus, DO 08/26/16 2216

## 2016-09-26 ENCOUNTER — Encounter (INDEPENDENT_AMBULATORY_CARE_PROVIDER_SITE_OTHER): Payer: Self-pay

## 2016-09-26 ENCOUNTER — Encounter (INDEPENDENT_AMBULATORY_CARE_PROVIDER_SITE_OTHER): Payer: Self-pay | Admitting: Specialist

## 2016-09-26 ENCOUNTER — Ambulatory Visit (INDEPENDENT_AMBULATORY_CARE_PROVIDER_SITE_OTHER): Payer: Medicare HMO

## 2016-09-26 ENCOUNTER — Ambulatory Visit (INDEPENDENT_AMBULATORY_CARE_PROVIDER_SITE_OTHER): Payer: Medicare HMO | Admitting: Specialist

## 2016-09-26 VITALS — BP 158/81 | HR 87 | Ht 65.0 in | Wt 187.0 lb

## 2016-09-26 DIAGNOSIS — M25551 Pain in right hip: Secondary | ICD-10-CM

## 2016-09-26 DIAGNOSIS — M1611 Unilateral primary osteoarthritis, right hip: Secondary | ICD-10-CM

## 2016-09-26 DIAGNOSIS — M48062 Spinal stenosis, lumbar region with neurogenic claudication: Secondary | ICD-10-CM | POA: Diagnosis not present

## 2016-09-26 MED ORDER — TRAMADOL HCL 50 MG PO TABS: 50.0000 mg | ORAL_TABLET | Freq: Four times a day (QID) | ORAL | 0 refills | Status: DC | PRN

## 2016-09-26 NOTE — Progress Notes (Signed)
Office Visit Note   Patient: [name]           Date of Birth: 08/09/1951           MRN: [id number] Visit Date: 09/26/2016              Requested by: Mirna MiresGerald Hill, MD [address] [place], [place] [id number] PCP: Evlyn CourierGerald K Hill, MD   Assessment & Plan: Visit Diagnoses:  1. Pain of right hip joint   2. Spinal stenosis of lumbar region with neurogenic claudication   3. Unilateral primary osteoarthritis, right hip     Plan: Avoid bending, stooping and avoid lifting weights greater than 10 lbs. Avoid prolong standing and walking. Avoid frequent bending and stooping  No lifting greater than 10 lbs. May use ice or moist heat for pain. Weight loss is of benefit. Handicap license is approved. Use of a cane in the left hand for the right hip pain. Tylenol 500 mg 2 tablets 3 times a day maximum of 6 per day.   Follow-Up Instructions: Return in about 3 months (around 12/25/2016).   Orders:  Orders Placed This Encounter  Procedures  . XR HIP UNILAT W OR W/O PELVIS 1V RIGHT   Meds ordered this encounter  Medications  . traMADol (ULTRAM) 50 MG tablet    Sig: Take 1 tablet (50 mg total) by mouth every 6 (six) hours as needed for moderate pain.    Dispense:  90 tablet    Refill:  0      Procedures: No procedures performed   Clinical Data: No additional findings.   Subjective: Chief Complaint  Patient presents with  . Lower Back - Pain    Low back pain radiating into right leg. States its keeping her from sleeping at night. Patient was last seen on 11/02/2015 and there was discussion of surgery. States Dr. Loleta ChanceHill sent over clarence but never heard anything back from us on getting her scheduled. Right leg numbness and tingling, also leg weakness.    Review of Systems   Objective: Vital Signs: BP (!) 158/81   Pulse 87   Ht 5\' 5"  (1.651 m)   Wt 187 lb (84.8 kg)   BMI 31.12 kg/m   Physical Exam  Ortho Exam  Specialty Comments:  No specialty  comments available.  Imaging: Xr Hip Unilat W Or W/o Pelvis 1v Right  Result Date: 09/26/2016 [facility] pelvis and lateral of the right hip shows sclerosis in the upper later 1/3rd of the the right femoral head, there is osteophyte over the superolateral right acetabulum. Sclerosis of subchondral bone both the superior acetabulum and the superior head of the the right femur. Findings consistent with mild to moderate right hip osteoarthritis.    PMFS History: Patient Active Problem List   Diagnosis Date Noted  . Stiffness of joint, not elsewhere classified, other specified site 08/04/2013  . Leg weakness, bilateral 08/04/2013  . CHEST PAIN UNSPECIFIED 05/10/2009  . BENIGN POSITIONAL VERTIGO 07/30/2007  . VARICOSE VEINS, LOWER EXTREMITIES 07/30/2007  . SCIATICA, CHRONIC 07/30/2007  . DIABETES MELLITUS, TYPE II 11/18/2006  . HYPERLIPIDEMIA 11/18/2006  . MIGRAINE HEADACHE 11/18/2006  . LESION, SCIATIC NERVE 11/18/2006  . HYPERTENSION 11/18/2006  . HYPOTENSION, ORTHOSTATIC 11/18/2006  . BRONCHITIS NOS 11/18/2006  . Other emphysema (HCC) 11/18/2006  . ASTHMA 11/18/2006  . OSTEOARTHRITIS 11/18/2006  . LOW BACK PAIN 11/18/2006  . VERTIGO 11/18/2006   Past Medical History:  Diagnosis Date  . Asthma   .  Bronchitis   . Diabetes mellitus   . Hyperlipidemia   . Hypertension   . Low back pain   . Migraine   . Osteoarthritis   . Sciatica   . Varicose veins   . Vertigo     Family History  Problem Relation Age of Onset  . Lung cancer Mother   . Stroke Father   . Heart failure Sister   . Heart failure Sister     Past Surgical History:  Procedure Laterality Date  . ABDOMINAL HYSTERECTOMY    . CARPAL TUNNEL RELEASE    . CESAREAN SECTION    . INGUINAL HERNIA REPAIR    . LUMBAR FUSION     Social History   Occupational History  . Disabled Unemployed   Social History Main Topics  . Smoking status: Never Smoker  . Smokeless tobacco: Never Used  . Alcohol use No  . Drug use: No    . Sexual activity: Not on file

## 2016-09-26 NOTE — Patient Instructions (Signed)
Avoid bending, stooping and avoid lifting weights greater than 10 lbs. Avoid prolong standing and walking. Avoid frequent bending and stooping  No lifting greater than 10 lbs. May use ice or moist heat for pain. Weight loss is of benefit. Handicap license is approved. Use of a cane in the left hand for the right hip pain. Tylenol 500 mg 2 tablets 3 times a day maximum of 6 per day.

## 2016-12-26 ENCOUNTER — Ambulatory Visit (INDEPENDENT_AMBULATORY_CARE_PROVIDER_SITE_OTHER): Payer: Medicare Other | Admitting: Specialist

## 2016-12-26 ENCOUNTER — Encounter (INDEPENDENT_AMBULATORY_CARE_PROVIDER_SITE_OTHER): Payer: Self-pay | Admitting: Specialist

## 2016-12-26 ENCOUNTER — Ambulatory Visit (INDEPENDENT_AMBULATORY_CARE_PROVIDER_SITE_OTHER): Payer: Medicare Other

## 2016-12-26 VITALS — BP 152/80 | HR 106 | Ht 65.0 in | Wt 187.0 lb

## 2016-12-26 DIAGNOSIS — M5136 Other intervertebral disc degeneration, lumbar region: Secondary | ICD-10-CM | POA: Diagnosis not present

## 2016-12-26 DIAGNOSIS — M48062 Spinal stenosis, lumbar region with neurogenic claudication: Secondary | ICD-10-CM | POA: Diagnosis not present

## 2016-12-26 DIAGNOSIS — M25551 Pain in right hip: Secondary | ICD-10-CM

## 2016-12-26 MED ORDER — NAPROXEN 500 MG PO TABS: 500.0000 mg | ORAL_TABLET | Freq: Two times a day (BID) | ORAL | 3 refills | Status: DC

## 2016-12-26 MED ORDER — TRAMADOL HCL 50 MG PO TABS: 50.0000 mg | ORAL_TABLET | Freq: Four times a day (QID) | ORAL | 0 refills | Status: DC | PRN

## 2016-12-26 NOTE — Addendum Note (Signed)
Addended by: Penne LashSHUE WILLS, Neysa BonitoHRISTY N on: 12/26/2016 02:17 PM   Modules accepted: Orders

## 2016-12-26 NOTE — Progress Notes (Signed)
Office Visit Note   Patient: [name]           Date of Birth: 03-22-1951           MRN: [id number] Visit Date: 12/26/2016              Requested by: Mirna Mires, MD [address] [place], [place] [address] PCP: Evlyn Courier, MD   Assessment & Plan: Visit Diagnoses:  1. DDD (degenerative disc disease), lumbar   2. Spinal stenosis of lumbar region with neurogenic claudication   3. Pain of right hip joint     Plan: Avoid bending, stooping and avoid lifting weights greater than 10 lbs. Avoid prolong standing and walking. Avoid frequent bending and stooping  No lifting greater than 10 lbs. May use ice or moist heat for pain. Weight loss is of benefit. Handicap license is approved.  Avoid frequent bending and stooping  No lifting greater than 10 lbs. May use ice or moist heat for pain. Weight loss is of benefit.    Follow-Up Instructions: Return in about 4 weeks (around 01/23/2017).   Orders:  Orders Placed This Encounter  Procedures  . XR SCOLIOSIS EVAL COMPLETE SPINE 2 OR 3 VIEWS   Meds ordered this encounter  Medications  . traMADol (ULTRAM) 50 MG tablet    Sig: Take 1 tablet (50 mg total) by mouth every 6 (six) hours as needed for moderate pain.    Dispense:  90 tablet    Refill:  0  . naproxen (NAPROSYN) 500 MG tablet    Sig: Take 1 tablet (500 mg total) by mouth 2 (two) times daily with a meal.    Dispense:  60 tablet    Refill:  3      Procedures: No procedures performed   Clinical Data: No additional findings.   Subjective: Chief Complaint  Patient presents with  . Lower Back - Follow-up    Patient returns for 3 month follow up of low back pain.  Patient states that she is having constant back pain that is worse with activity, specifically housework.  She is having radicular right leg pain with numbness and tingling. Pain in the right leg from the hip, buttock and right thigh with burning pain into the right shin. Rubbing the skin  is painful. The right leg feels weak, weak with stair climbing. Lives at home with sister, sister had lumbar sugery after her mother past 6 years ago. Bowel and bladder is okay but keeps a urine infection nearly continuously. Grocery shopping while leaning on a cart, electric cart not being use. Pain level is "5" with scale of 1-10.    Review of Systems  HENT: Positive for rhinorrhea and sinus pain.   Eyes: Negative.   Respiratory: Positive for cough and wheezing.   Cardiovascular: Negative.   Gastrointestinal: Negative.   Endocrine: Negative.   Genitourinary: Negative.   Musculoskeletal: Positive for back pain.  Skin: Negative.   Allergic/Immunologic: Negative.   Neurological: Positive for weakness and numbness.  Hematological: Negative.   Psychiatric/Behavioral: Negative.      Objective: Vital Signs: BP (!) 152/80 (BP Location: Left Arm, Patient Position: Sitting)   Pulse (!) 106   Ht 5\' 5"  (1.651 m)   Wt 187 lb (84.8 kg)   BMI 31.12 kg/m   Physical Exam  Constitutional: She is oriented to person, place, and time. She appears well-developed and well-nourished.  HENT:  Head: Normocephalic and atraumatic.  Eyes: EOM are  normal. Pupils are equal, round, and reactive to light.  Neck: Normal range of motion. Neck supple.  Pulmonary/Chest: Effort normal and breath sounds normal.  Abdominal: Soft. Bowel sounds are normal.  Musculoskeletal: Normal range of motion.  Neurological: She is alert and oriented to person, place, and time.  Skin: Skin is warm and dry.  Psychiatric: She has a normal mood and affect. Her behavior is normal. Judgment and thought content normal.    Back Exam   Muscle Strength  Right Quadriceps:  5/5  Left Quadriceps:  5/5  Right Hamstrings:  5/5  Left Hamstrings:  5/5   Reflexes  Patellar:  Hyporeflexic abnormal Achilles: Hyporeflexic Babinski's sign: normal       Specialty Comments:  No specialty comments available.  Imaging: No results  found.   PMFS History: Patient Active Problem List   Diagnosis Date Noted  . Stiffness of joint, not elsewhere classified, other specified site 08/04/2013  . Leg weakness, bilateral 08/04/2013  . CHEST PAIN UNSPECIFIED 05/10/2009  . BENIGN POSITIONAL VERTIGO 07/30/2007  . VARICOSE VEINS, LOWER EXTREMITIES 07/30/2007  . SCIATICA, CHRONIC 07/30/2007  . DIABETES MELLITUS, TYPE II 11/18/2006  . HYPERLIPIDEMIA 11/18/2006  . MIGRAINE HEADACHE 11/18/2006  . LESION, SCIATIC NERVE 11/18/2006  . HYPERTENSION 11/18/2006  . HYPOTENSION, ORTHOSTATIC 11/18/2006  . BRONCHITIS NOS 11/18/2006  . Other emphysema (HCC) 11/18/2006  . ASTHMA 11/18/2006  . OSTEOARTHRITIS 11/18/2006  . LOW BACK PAIN 11/18/2006  . VERTIGO 11/18/2006   Past Medical History:  Diagnosis Date  . Asthma   . Bronchitis   . Diabetes mellitus   . Hyperlipidemia   . Hypertension   . Low back pain   . Migraine   . Osteoarthritis   . Sciatica   . Varicose veins   . Vertigo     Family History  Problem Relation Age of Onset  . Lung cancer Mother   . Stroke Father   . Heart failure Sister   . Heart failure Sister     Past Surgical History:  Procedure Laterality Date  . ABDOMINAL HYSTERECTOMY    . CARPAL TUNNEL RELEASE    . CESAREAN SECTION    . INGUINAL HERNIA REPAIR    . LUMBAR FUSION     Social History   Occupational History  . Disabled Unemployed   Social History Main Topics  . Smoking status: Never Smoker  . Smokeless tobacco: Never Used  . Alcohol use No  . Drug use: No  . Sexual activity: Not on file

## 2016-12-26 NOTE — Patient Instructions (Signed)
Avoid bending, stooping and avoid lifting weights greater than 10 lbs. Avoid prolong standing and walking. Avoid frequent bending and stooping  No lifting greater than 10 lbs. May use ice or moist heat for pain. Weight loss is of benefit. Handicap license is approved. Avoid frequent bending and stooping  No lifting greater than 10 lbs. May use ice or moist heat for pain. Weight loss is of benefit.   

## 2017-01-09 DIAGNOSIS — E1122 Type 2 diabetes mellitus with diabetic chronic kidney disease: Secondary | ICD-10-CM | POA: Diagnosis not present

## 2017-01-09 DIAGNOSIS — R21 Rash and other nonspecific skin eruption: Secondary | ICD-10-CM | POA: Diagnosis not present

## 2017-01-09 DIAGNOSIS — I10 Essential (primary) hypertension: Secondary | ICD-10-CM | POA: Diagnosis not present

## 2017-01-09 DIAGNOSIS — M545 Low back pain: Secondary | ICD-10-CM | POA: Diagnosis not present

## 2017-01-09 DIAGNOSIS — E1165 Type 2 diabetes mellitus with hyperglycemia: Secondary | ICD-10-CM | POA: Diagnosis not present

## 2017-01-28 ENCOUNTER — Telehealth (INDEPENDENT_AMBULATORY_CARE_PROVIDER_SITE_OTHER): Payer: Self-pay | Admitting: Specialist

## 2017-01-28 NOTE — Telephone Encounter (Signed)
Patient called advised the medication (Naproxin) broke her out. Patient said she discontinued taking the medication. Patient said she took the medicine about 3 weeks. Patient asked if Dr. Otelia Sergeant  would put her back on Pramadol.  Patient said she uses the [facility] in [place]. The number to contact patient is [phone]

## 2017-01-28 NOTE — Telephone Encounter (Signed)
Patient called advised the medication (Naproxin) broke her out. Patient said she discontinued taking the medication. Patient said she took the medicine about 3 weeks. Patient asked if Dr. Otelia Sergeant  would put her back on Pramadol.  Patient said she uses the [facility] in [place].

## 2017-01-30 ENCOUNTER — Ambulatory Visit (HOSPITAL_COMMUNITY)
Admission: RE | Admit: 2017-01-30 | Discharge: 2017-01-30 | Disposition: A | Discharge status: Home / Self Care | Payer: Medicare Other | Source: Ambulatory Visit | Attending: Specialist | Admitting: Specialist

## 2017-01-30 DIAGNOSIS — M25551 Pain in right hip: Secondary | ICD-10-CM | POA: Insufficient documentation

## 2017-01-30 DIAGNOSIS — M5136 Other intervertebral disc degeneration, lumbar region: Secondary | ICD-10-CM | POA: Diagnosis not present

## 2017-01-30 DIAGNOSIS — M48062 Spinal stenosis, lumbar region with neurogenic claudication: Secondary | ICD-10-CM | POA: Diagnosis not present

## 2017-01-30 DIAGNOSIS — I739 Peripheral vascular disease, unspecified: Secondary | ICD-10-CM | POA: Diagnosis not present

## 2017-01-31 ENCOUNTER — Other Ambulatory Visit (INDEPENDENT_AMBULATORY_CARE_PROVIDER_SITE_OTHER): Payer: Self-pay | Admitting: Specialist

## 2017-02-04 ENCOUNTER — Other Ambulatory Visit (INDEPENDENT_AMBULATORY_CARE_PROVIDER_SITE_OTHER): Payer: Self-pay | Admitting: Specialist

## 2017-02-04 DIAGNOSIS — M25551 Pain in right hip: Secondary | ICD-10-CM

## 2017-02-04 DIAGNOSIS — M48062 Spinal stenosis, lumbar region with neurogenic claudication: Secondary | ICD-10-CM

## 2017-02-04 MED ORDER — TRAMADOL HCL 50 MG PO TABS: 50.0000 mg | ORAL_TABLET | Freq: Four times a day (QID) | ORAL | 0 refills | Status: DC | PRN

## 2017-02-04 NOTE — Telephone Encounter (Signed)
Rx for tramadol printed and I will sign, needs to be called to her pharmacy. jen

## 2017-02-05 NOTE — Telephone Encounter (Signed)
Pt aware rx was called to [facility]

## 2017-02-06 ENCOUNTER — Ambulatory Visit (INDEPENDENT_AMBULATORY_CARE_PROVIDER_SITE_OTHER): Payer: Medicare Other | Admitting: Specialist

## 2017-02-06 ENCOUNTER — Encounter (INDEPENDENT_AMBULATORY_CARE_PROVIDER_SITE_OTHER): Payer: Self-pay | Admitting: Specialist

## 2017-02-06 VITALS — BP 151/76 | HR 92 | Ht 65.0 in | Wt 187.0 lb

## 2017-02-06 DIAGNOSIS — M48062 Spinal stenosis, lumbar region with neurogenic claudication: Secondary | ICD-10-CM | POA: Diagnosis not present

## 2017-02-06 DIAGNOSIS — M1611 Unilateral primary osteoarthritis, right hip: Secondary | ICD-10-CM | POA: Diagnosis not present

## 2017-02-06 DIAGNOSIS — M5135 Other intervertebral disc degeneration, thoracolumbar region: Secondary | ICD-10-CM | POA: Diagnosis not present

## 2017-02-06 MED ORDER — DICLOFENAC POTASSIUM 50 MG PO TABS: 50.0000 mg | ORAL_TABLET | Freq: Three times a day (TID) | ORAL | 3 refills | Status: DC

## 2017-02-06 NOTE — Progress Notes (Addendum)
Office Visit Note   Patient: [name]           Date of Birth: 06-Jun-1951           MRN: [id number] Visit Date: 02/06/2017              Requested by: Mirna Mires, MD [address] [place], [place] [address] PCP: Evlyn Courier, MD   Assessment & Plan: Visit Diagnoses:  1. DDD (degenerative disc disease), thoracolumbar   2. Spinal stenosis of lumbar region with neurogenic claudication   3. Unilateral primary osteoarthritis, right hip    Scoliosis films and previous myelogram12/2016 reviewed with patient, she ambulates with a right gluteal lurch, plain radiographs from 09/2016 show DJD right hip, she is painful with internal rotation and right hip flexion. 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 She is not  really at a point where I think she would see a great deal of change in her spine condition. There is definite hip OA and she is symptomatic, I would like her to see Dr. Magnus Ivan for an evaluation for considering a Right THR which she may find relief of a greater part of her right hip and  Groin pain.   Plan: Plan: The main ways of treat osteoarthritis, that are found to be success. Weight loss helps to decrease pain. Exercise is important to maintaining cartilage and thickness and strengthening. NSAIDs like  tylenol, diclofenac are meds decreasing the inflamation. Ice is okay  In afternoon and evening and hot shower in the am Walk as tolerated, may want to learn to swim to be able to walk in a swimming pool safely. Consider using the exercise bike or starting a yoga program.  Stop use of meloxicam and start use of diclofenac 50 mg up to TID.   Follow-Up Instructions: Return in about 3 weeks (around 02/27/2017) for evaluation by Dr. Magnus Ivan or Kriste Basque for OA of the hip..   Orders:  No orders of the defined types were placed in this encounter.  No orders of the defined types were placed in this encounter.     Procedures: No procedures performed   Clinical Data: No additional findings.   Subjective: Chief Complaint  Patient presents with  . Lower Back - Follow-up    66 year old female with history of lumbar fusions L3 to S1 she is experiencing increasing difficulties with standing and walking. Tending to lean on carts in the grocery store.  Pain is present whether she is sitting or standing. Has diabetes, is taking insulin and metformin. She is also taking meloxicam for arthritis pain and hydrocodone. Taking the Hydrocodone as she needs about 2 x a day.Takes in the morning and at night. No bowel or bladder difficulties.     Review of Systems  HENT: Negative.   Eyes: Negative.   Respiratory: Negative.   Cardiovascular: Negative.   Gastrointestinal: Negative.   Endocrine: Negative.   Genitourinary: Negative.   Musculoskeletal: Negative.   Skin: Negative.   Allergic/Immunologic: Negative.   Neurological: Positive for weakness and numbness.  Hematological: Negative.   Psychiatric/Behavioral: Negative.      Objective: Vital Signs: BP (!) 151/76 (BP Location: Left Arm, Patient Position: Sitting)   Pulse 92   Ht   (1.651 m)   Wt 187 lb (84.8 kg)   BMI 31.12 kg/m   Physical Exam  Constitutional: She is oriented to person, place, and time. She appears well-developed and well-nourished.  HENT:  Head: Normocephalic and atraumatic.  Eyes: EOM are normal. Pupils are equal, round, and reactive to light.  Neck: Normal range of motion. Neck supple.  Pulmonary/Chest: Effort normal and breath sounds normal.  Abdominal: Soft. Bowel sounds are normal.  Neurological: She is alert and oriented to person, place, and time.  Skin: Skin is warm and dry.  Psychiatric: She has a normal mood and affect. Her behavior is  normal. Judgment and thought content normal.    Right Hip Exam   Tenderness  The patient is experiencing tenderness in the greater trochanter and anterior.  Range of Motion  Extension: normal  Flexion:  90 abnormal  Internal Rotation:  0 abnormal  External Rotation:  30 normal  Abduction: normal  Adduction: normal   Muscle Strength  Abduction: 5/5  Adduction: 5/5  Flexion: 5/5   Tests  FABER: positive Ober: positive  Other  Sensation: normal Pulse: present      Specialty Comments:  No specialty comments available.  Imaging: No results found.   PMFS History: Patient Active Problem List   Diagnosis Date Noted  . Stiffness of joint, not elsewhere classified, other specified site 08/04/2013  . Leg weakness, bilateral 08/04/2013  . CHEST PAIN UNSPECIFIED 05/10/2009  . BENIGN POSITIONAL VERTIGO 07/30/2007  . VARICOSE VEINS, LOWER EXTREMITIES 07/30/2007  . SCIATICA, CHRONIC 07/30/2007  . DIABETES MELLITUS, TYPE II 11/18/2006  . HYPERLIPIDEMIA 11/18/2006  . MIGRAINE HEADACHE 11/18/2006  . LESION, SCIATIC NERVE 11/18/2006  . HYPERTENSION 11/18/2006  . HYPOTENSION, ORTHOSTATIC 11/18/2006  . BRONCHITIS NOS 11/18/2006  . Other emphysema (HCC) 11/18/2006  . ASTHMA 11/18/2006  . OSTEOARTHRITIS 11/18/2006  . LOW BACK PAIN 11/18/2006  . VERTIGO 11/18/2006   Past Medical History:  Diagnosis Date  . Asthma   . Bronchitis   . Diabetes mellitus   . Hyperlipidemia   . Hypertension   . Low back pain   . Migraine   . Osteoarthritis   . Sciatica   . Varicose veins   . Vertigo     Family History  Problem Relation Age of Onset  . Lung cancer Mother   . Stroke Father   . Heart failure Sister   . Heart failure Sister     Past Surgical History:  Procedure Laterality Date  . ABDOMINAL HYSTERECTOMY    . CARPAL TUNNEL RELEASE    . CESAREAN SECTION    . INGUINAL HERNIA REPAIR    . LUMBAR FUSION     Social History   Occupational History  . Disabled  Unemployed   Social History Main Topics  . Smoking status: Never Smoker  . Smokeless tobacco: Never Used  . Alcohol use No  . Drug use: No  . Sexual activity: Not on file

## 2017-02-06 NOTE — Patient Instructions (Signed)
Plan: The main ways of treat osteoarthritis, that are found to be success. Weight loss helps to decrease pain. Exercise is important to maintaining cartilage and thickness and strengthening. NSAIDs like  tylenol, diclofenac are meds decreasing the inflamation. Ice is okay  In afternoon and evening and hot shower in the am Walk as tolerated, may want to learn to swim to be able to walk in a swimming pool safely. Consider using the exercise bike or starting a yoga program.  Stop use of meloxicam and start use of diclofenac 50 mg up to TID.

## 2017-02-27 ENCOUNTER — Ambulatory Visit (INDEPENDENT_AMBULATORY_CARE_PROVIDER_SITE_OTHER): Payer: Medicare Other | Admitting: Physician Assistant

## 2017-02-27 ENCOUNTER — Encounter (INDEPENDENT_AMBULATORY_CARE_PROVIDER_SITE_OTHER): Payer: Self-pay | Admitting: Orthopaedic Surgery

## 2017-02-27 VITALS — Ht 65.0 in | Wt 187.0 lb

## 2017-02-27 DIAGNOSIS — M25551 Pain in right hip: Secondary | ICD-10-CM | POA: Diagnosis not present

## 2017-02-27 NOTE — Addendum Note (Signed)
Addended by: Donalee CitrinPEELE, Verle Brillhart L on: 02/27/2017 10:56 AM   Modules accepted: Orders

## 2017-02-27 NOTE — Progress Notes (Signed)
Office Visit Note   Patient: [name]           Date of Birth: 19-Mar-1951           MRN: [id number] Visit Date: 02/27/2017              Requested by: Angela Mires, MD [address] [place], [place] [address] PCP: Angela Mires, MD   Assessment & Plan: Visit Diagnoses:  1. Pain in right hip     Plan: MRI of her right hip to evaluate cartilage. Have her follow-up after the MRI to go over results and discuss further treatment. Unable to perform intra-articular injection right hip due to poorly controlled diabetes and the fact that her that she states the injections cause her glucose levels to become quite elevated. Reviewed AP and lateral views of the right hip shows mild sclerotic changes of the acetabulum but the hip joint space remains well maintained. No acute fractures no bony abnormalities otherwise.  Follow-Up Instructions: Return in about 2 weeks (around 03/13/2017) for after MRI.   Orders:  No orders of the defined types were placed in this encounter.  No orders of the defined types were placed in this encounter.     Procedures: No procedures performed   Clinical Data: No additional findings.   Subjective: Chief Complaint  Patient presents with  . Right Hip - New Evaluation    Right hip evaluation per [name]    HPI [name] 66 year old female seen at the request of [name], or evaluation of right hip osteoarthritis as the source of the patient's pain. She states that she feels as if pain is from her back as a does radiate all the way down her leg. She does have occasional groin pain. She has pain down the leg even when lying down does have some pain ambulating. She does not use any assistive devices to ambulate. She has no difficulty donning shoes or socks. She reports that she is poorly controlled diabetic.She reported to [name] that her glucose levels can run as high as the 400s.  Review of Systems No chest pain shortness breath  fevers chills. Please see history of present illness otherwise negative  Objective: Vital Signs: Ht 5\' 5"  (1.651 m)   Wt 187 lb (84.8 kg)   BMI 31.12 kg/m   Physical Exam  Constitutional: She is oriented to person, place, and time. She appears well-developed and well-nourished. No distress.  Pulmonary/Chest: Effort normal.  Neurological: She is alert and oriented to person, place, and time.  Psychiatric: She has a normal mood and affect. Her behavior is normal.    Ortho Exam: Leg lengths are equal. Easily able to log roll her hips without pain. Extreme internal rotation and external rotation of her right hip does cause pain but she has fluid motion of both hips. Specialty Comments:   No specialty comments available.  Imaging: No results found.   PMFS History: Patient Active Problem List   Diagnosis Date Noted  . Stiffness of joint, not elsewhere classified, other specified site 08/04/2013  . Leg weakness, bilateral 08/04/2013  . CHEST PAIN UNSPECIFIED 05/10/2009  . BENIGN POSITIONAL VERTIGO 07/30/2007  . VARICOSE VEINS, LOWER EXTREMITIES 07/30/2007  . SCIATICA, CHRONIC 07/30/2007  . DIABETES MELLITUS, TYPE II 11/18/2006  . HYPERLIPIDEMIA 11/18/2006  . MIGRAINE HEADACHE 11/18/2006  . LESION, SCIATIC NERVE 11/18/2006  . HYPERTENSION 11/18/2006  . HYPOTENSION, ORTHOSTATIC 11/18/2006  . BRONCHITIS NOS 11/18/2006  . Other emphysema (  HCC) 11/18/2006  . ASTHMA 11/18/2006  . OSTEOARTHRITIS 11/18/2006  . LOW BACK PAIN 11/18/2006  . VERTIGO 11/18/2006   Past Medical History:  Diagnosis Date  . Asthma   . Bronchitis   . Diabetes mellitus   . Hyperlipidemia   . Hypertension   . Low back pain   . Migraine   . Osteoarthritis   . Sciatica   . Varicose veins   . Vertigo     Family History  Problem Relation Age of Onset  . Lung cancer Mother   . Stroke Father   . Heart failure Sister   . Heart failure Sister     Past Surgical History:  Procedure Laterality Date  .  ABDOMINAL HYSTERECTOMY    . CARPAL TUNNEL RELEASE    . CESAREAN SECTION    . INGUINAL HERNIA REPAIR    . LUMBAR FUSION     Social History   Occupational History  . Disabled Unemployed   Social History Main Topics  . Smoking status: Never Smoker  . Smokeless tobacco: Never Used  . Alcohol use No  . Drug use: No  . Sexual activity: Not on file

## 2017-03-06 ENCOUNTER — Ambulatory Visit (HOSPITAL_COMMUNITY)
Admission: RE | Admit: 2017-03-06 | Discharge: 2017-03-06 | Disposition: A | Discharge status: Home / Self Care | Payer: Medicare Other | Source: Ambulatory Visit | Attending: Physician Assistant | Admitting: Physician Assistant

## 2017-03-06 DIAGNOSIS — M25551 Pain in right hip: Secondary | ICD-10-CM | POA: Insufficient documentation

## 2017-03-18 ENCOUNTER — Ambulatory Visit (INDEPENDENT_AMBULATORY_CARE_PROVIDER_SITE_OTHER): Payer: Medicare Other | Admitting: Orthopaedic Surgery

## 2017-03-18 DIAGNOSIS — M25551 Pain in right hip: Secondary | ICD-10-CM

## 2017-03-18 NOTE — Progress Notes (Signed)
The patient is here for follow-up after MRI of her right hip was obtained. We will send her for an MRI of his hip to evaluate for the potential for avascular necrosis or any cartilage deficits are not really seen on plain film. She was sent to me due to the acute nature of her right hip pain but she does have some chronic back issues for which she sees Dr.Nitka. He had wanted take a look at her hip to rule out it being a hip issue.  On examination I can easily put her right hip through internal/external rotation as well as flexion extension. She seems to be showing pain more to her lumbar spine and sciatic region with a positive straight leg raise to the right side. MRI is reviewed with her and shows a normal-appearing right hip. There is no cartilage deficits and no effusion. There is no evidence of bony destruction or fracture. There is no evidence of AVN.  I gave her reassurance that this was not a hip issue from my standpoint. She said that she would follow-up with Dr. Otelia SergeantNitka about her back .

## 2017-04-10 DIAGNOSIS — N390 Urinary tract infection, site not specified: Secondary | ICD-10-CM | POA: Diagnosis not present

## 2017-04-10 DIAGNOSIS — I10 Essential (primary) hypertension: Secondary | ICD-10-CM | POA: Diagnosis not present

## 2017-04-10 DIAGNOSIS — E118 Type 2 diabetes mellitus with unspecified complications: Secondary | ICD-10-CM | POA: Diagnosis not present

## 2017-05-03 ENCOUNTER — Ambulatory Visit (INDEPENDENT_AMBULATORY_CARE_PROVIDER_SITE_OTHER): Payer: Medicare Other | Admitting: Specialist

## 2017-05-03 ENCOUNTER — Ambulatory Visit (INDEPENDENT_AMBULATORY_CARE_PROVIDER_SITE_OTHER): Payer: Medicare Other

## 2017-05-03 ENCOUNTER — Encounter (INDEPENDENT_AMBULATORY_CARE_PROVIDER_SITE_OTHER): Payer: Self-pay | Admitting: Specialist

## 2017-05-03 ENCOUNTER — Telehealth (INDEPENDENT_AMBULATORY_CARE_PROVIDER_SITE_OTHER): Payer: Self-pay | Admitting: Specialist

## 2017-05-03 VITALS — BP 151/75 | HR 84 | Ht 65.0 in | Wt 187.0 lb

## 2017-05-03 DIAGNOSIS — M5135 Other intervertebral disc degeneration, thoracolumbar region: Secondary | ICD-10-CM | POA: Diagnosis not present

## 2017-05-03 DIAGNOSIS — M5416 Radiculopathy, lumbar region: Secondary | ICD-10-CM

## 2017-05-03 NOTE — Telephone Encounter (Signed)
Patient called advised she is having her MRI 05/23/17. Patient advised will need an appointment to review the MRI with Dr Otelia SergeantNitka. The number to contact patient is [phone]

## 2017-05-03 NOTE — Addendum Note (Signed)
Addended by: Mardene CelesteHUMPHREY, Granite Godman B on: 05/03/2017 11:58 AM   Modules accepted: Orders

## 2017-05-03 NOTE — Progress Notes (Signed)
Office Visit Note   Patient: [name]           Date of Birth: 1951-08-05           MRN: [id number] Visit Date: 05/03/2017              Requested by: Mirna Mires, MD [address] [place], [place] [address] PCP: Mirna Mires, MD   Assessment & Plan: Visit Diagnoses:  1. DDD (degenerative disc disease), thoracolumbar   2. Radiculopathy, lumbar region     Plan: Will review lumbar spine x-rays with Dr. Otelia Sergeant.  Patient will need further imaging studies with CT versus MRI lumbar spine. We'll review the last CT with Dr. Otelia Sergeant and he'll decide which study he would prefer.  Patient follow up in the office after completion of the study to discuss results. All questions answered. Again patient stated that if surgery is not showing to help her ongoing problem that she would like to consider having this done.  Follow-Up Instructions: Return in about 3 weeks (around 05/24/2017).   Orders:  Orders Placed This Encounter  Procedures  . XR Lumbar Spine Complete   No orders of the defined types were placed in this encounter.     Procedures: No procedures performed   Clinical Data: No additional findings.   Subjective: Chief Complaint  Patient presents with  . Lower Back - Follow-up    HPI Patient returns for follow-up of low back pain and right lower extremity radiculopathy. She was seen by Dr. Magnus Ivan who do not find anything going on in her hip that would be causing her current complaint or contributing.  Right hip MRI report from 03/06/2017 read no fracture, dislocation or avascular necrosis. No findings to explain the patient's right hip pain. She describes ongoing pain in her low back that radiates into her right buttock, lateral hip and down towards her foot. Numbness and tingling down her leg as well. She has pain with all activity. Previous CT lumbar spine with contrast done 10/13/2015 report read multifactorial stenosis of a severe nature, representing adjacent  segment disease at L2-3 related to 3 mm retrolisthesis, central partially calcified disc protrusion with an additional cephalad migrated component, advanced posterior element hypertrophy and severe osseous ridging. Right greater than left L2 and L3 nerve root impingement. Not currently complaining of any left leg symptoms. Patient states that she does have diabetes and her blood sugars or not under great control at times and does not want any cortisone injections. She does state that if surgery can help give her relief she would like to entertain that.   Review of Systems No current cardiac pulmonary GI or GU issues.  Objective: Vital Signs: BP (!) 151/75 (BP Location: Left Arm, Patient Position: Sitting)   Pulse 84   Ht 5\' 5"  (1.651 m)   Wt 187 lb (84.8 kg)   BMI 31.12 kg/m   Physical Exam  Constitutional: She is oriented to person, place, and time. No distress.  HENT:  Head: Normocephalic and atraumatic.  Eyes: Pupils are equal, round, and reactive to light. EOM are normal.  Pulmonary/Chest: No respiratory distress.  Musculoskeletal:  Patient has a Trendelenburg gait. She is moderate to markedly tender over the right hip greater bursa and also down the course of her IT band. Right lateral hip pain with hip internal/external rotation. No groin pain with this maneuver. Positive right straight leg raise. Negative on the left side. Bilateral calves nontender. Neurovascular intact. No focal  motor deficits.  Neurological: She is alert and oriented to person, place, and time.  Skin: Skin is warm and dry.  Psychiatric: She has a normal mood and affect.    Ortho Exam  Specialty Comments:  No specialty comments available.  Imaging: No results found.   PMFS History: Patient Active Problem List   Diagnosis Date Noted  . Pain in right hip 03/18/2017  . Stiffness of joint, not elsewhere classified, other specified site 08/04/2013  . Leg weakness, bilateral 08/04/2013  . CHEST PAIN  UNSPECIFIED 05/10/2009  . BENIGN POSITIONAL VERTIGO 07/30/2007  . VARICOSE VEINS, LOWER EXTREMITIES 07/30/2007  . SCIATICA, CHRONIC 07/30/2007  . DIABETES MELLITUS, TYPE II 11/18/2006  . HYPERLIPIDEMIA 11/18/2006  . MIGRAINE HEADACHE 11/18/2006  . LESION, SCIATIC NERVE 11/18/2006  . HYPERTENSION 11/18/2006  . HYPOTENSION, ORTHOSTATIC 11/18/2006  . BRONCHITIS NOS 11/18/2006  . Other emphysema (HCC) 11/18/2006  . ASTHMA 11/18/2006  . OSTEOARTHRITIS 11/18/2006  . LOW BACK PAIN 11/18/2006  . VERTIGO 11/18/2006   Past Medical History:  Diagnosis Date  . Asthma   . Bronchitis   . Diabetes mellitus   . Hyperlipidemia   . Hypertension   . Low back pain   . Migraine   . Osteoarthritis   . Sciatica   . Varicose veins   . Vertigo     Family History  Problem Relation Age of Onset  . Lung cancer Mother   . Stroke Father   . Heart failure Sister   . Heart failure Sister     Past Surgical History:  Procedure Laterality Date  . ABDOMINAL HYSTERECTOMY    . CARPAL TUNNEL RELEASE    . CESAREAN SECTION    . INGUINAL HERNIA REPAIR    . LUMBAR FUSION     Social History   Occupational History  . Disabled Unemployed   Social History Main Topics  . Smoking status: Never Smoker  . Smokeless tobacco: Never Used  . Alcohol use No  . Drug use: No  . Sexual activity: Not on file

## 2017-05-06 NOTE — Telephone Encounter (Signed)
I put this on the cancellation list

## 2017-05-23 ENCOUNTER — Ambulatory Visit
Admission: RE | Admit: 2017-05-23 | Discharge: 2017-05-23 | Disposition: A | Discharge status: Home / Self Care | Payer: Medicare Other | Source: Ambulatory Visit | Attending: Surgery | Admitting: Surgery

## 2017-05-23 DIAGNOSIS — M48061 Spinal stenosis, lumbar region without neurogenic claudication: Secondary | ICD-10-CM | POA: Diagnosis not present

## 2017-05-23 DIAGNOSIS — M5126 Other intervertebral disc displacement, lumbar region: Secondary | ICD-10-CM | POA: Diagnosis not present

## 2017-05-23 DIAGNOSIS — M5135 Other intervertebral disc degeneration, thoracolumbar region: Secondary | ICD-10-CM

## 2017-05-23 DIAGNOSIS — M5416 Radiculopathy, lumbar region: Secondary | ICD-10-CM

## 2017-05-23 MED ORDER — GADOBENATE DIMEGLUMINE 529 MG/ML IV SOLN
13.0000 mL | Freq: Once | INTRAVENOUS | Status: AC | PRN
  Administered 2017-05-23: 13 mL via INTRAVENOUS

## 2017-06-21 ENCOUNTER — Ambulatory Visit (INDEPENDENT_AMBULATORY_CARE_PROVIDER_SITE_OTHER): Payer: Medicare Other | Admitting: Specialist

## 2017-06-21 ENCOUNTER — Encounter (INDEPENDENT_AMBULATORY_CARE_PROVIDER_SITE_OTHER): Payer: Self-pay | Admitting: Specialist

## 2017-06-21 VITALS — BP 166/72 | HR 82 | Ht 65.0 in | Wt 187.0 lb

## 2017-06-21 DIAGNOSIS — M4316 Spondylolisthesis, lumbar region: Secondary | ICD-10-CM

## 2017-06-21 DIAGNOSIS — M47814 Spondylosis without myelopathy or radiculopathy, thoracic region: Secondary | ICD-10-CM | POA: Diagnosis not present

## 2017-06-21 DIAGNOSIS — M48062 Spinal stenosis, lumbar region with neurogenic claudication: Secondary | ICD-10-CM | POA: Diagnosis not present

## 2017-06-21 DIAGNOSIS — M5136 Other intervertebral disc degeneration, lumbar region: Secondary | ICD-10-CM | POA: Diagnosis not present

## 2017-06-21 NOTE — Patient Instructions (Signed)
Plan:Avoid bending, stooping and avoid lifting weights greater than 10 lbs. Avoid prolong standing and walking. Order for a new walker with wheels. Surgery scheduling secretary Tivis RingerSherri Billings, will call you in the next week to schedule for surgery.  Surgery recommended is a 5 level thoracolumbar fusion T10 to L3-S1 this would be done with rods, screws and L1-2 and L2-3 cages with local bone graft and allograft (donor bone graft). Take hydrocodone for for pain. Risk of surgery includes risk of infection 1 in 100 patients, bleeding 5% chance you would need a transfusion.   Risk to the nerves is one in 10,000. You will need to use a brace for 3 months and wean from the brace on the 4th month. Expect improved walking and standing tolerance. Expect relief of leg pain but numbness may persist depending on the length and degree of pressure that has been present.

## 2017-06-21 NOTE — Progress Notes (Signed)
Office Visit Note   Patient: [name]           Date of Birth: April 26, 1951           MRN: [id number] Visit Date: 06/21/2017              Requested by: Mirna Mires, MD [address] [place], [place] [address] PCP: Mirna Mires, MD   Assessment & Plan: Visit Diagnoses:  1. Spinal stenosis of lumbar region with neurogenic claudication   2. Spondylolisthesis, lumbar region   3. Degenerative disc disease, lumbar     Plan:Avoid bending, stooping and avoid lifting weights greater than 10 lbs. Avoid prolong standing and walking. Order for a new walker with wheels. Surgery scheduling secretary Tivis Ringer, will call you in the next week to schedule for surgery.  Surgery recommended is a 5 level thoracolumbar fusion T10 to L3-S1 this would be done with rods, screws and L1-2 and L2-3 cages with local bone graft and allograft (donor bone graft). Take hydrocodone for for pain. Risk of surgery includes risk of infection 1 in 100 patients, bleeding 5% chance you would need a transfusion.   Risk to the nerves is one in 10,000. You will need to use a brace for 3 months and wean from the brace on the 4th month. Expect improved walking and standing tolerance. Expect relief of leg pain but numbness may persist depending on the length and degree of pressure that has been present.    Follow-Up Instructions: No Follow-up on file.   Orders:  No orders of the defined types were placed in this encounter.  No orders of the defined types were placed in this encounter.     Procedures: No procedures performed   Clinical Data: No additional findings.   Subjective: Chief Complaint  Patient presents with  . Lower Back - Follow-up    66 year old female with increasing neurogenic claudication symptoms and pain into the back and buttocks and hips. No bowel or bladder difficulties, No bowel or bladder difficulty.    Review of Systems   Objective: Vital Signs: BP (!)  166/72 (BP Location: Left Arm, Patient Position: Sitting)   Pulse 82   Ht  (1.651 m)   Wt 187 lb (84.8 kg)   BMI 31.12 kg/m   Physical Exam  Ortho Exam  Specialty Comments:  No specialty comments available.  Imaging: No results found.   PMFS History: Patient Active Problem List   Diagnosis Date Noted  . Pain in right hip 03/18/2017  . Stiffness of joint, not elsewhere classified, other specified site 08/04/2013  . Leg weakness, bilateral 08/04/2013  . CHEST PAIN UNSPECIFIED 05/10/2009  . BENIGN POSITIONAL VERTIGO 07/30/2007  . VARICOSE VEINS, LOWER EXTREMITIES 07/30/2007  . SCIATICA, CHRONIC 07/30/2007  . DIABETES MELLITUS, TYPE II 11/18/2006  . HYPERLIPIDEMIA 11/18/2006  . MIGRAINE HEADACHE 11/18/2006  . LESION, SCIATIC NERVE 11/18/2006  . HYPERTENSION 11/18/2006  . HYPOTENSION, ORTHOSTATIC 11/18/2006  . BRONCHITIS NOS 11/18/2006  . Other emphysema (HCC) 11/18/2006  . ASTHMA 11/18/2006  . OSTEOARTHRITIS 11/18/2006  . LOW BACK PAIN 11/18/2006  . VERTIGO 11/18/2006   Past Medical History:  Diagnosis Date  . Asthma   . Bronchitis   . Diabetes mellitus   . Hyperlipidemia   . Hypertension   . Low back pain   . Migraine   . Osteoarthritis   . Sciatica   . Varicose veins   . Vertigo     Family  History  Problem Relation Age of Onset  . Lung cancer Mother   . Stroke Father   . Heart failure Sister   . Heart failure Sister     Past Surgical History:  Procedure Laterality Date  . ABDOMINAL HYSTERECTOMY    . CARPAL TUNNEL RELEASE    . CESAREAN SECTION    . INGUINAL HERNIA REPAIR    . LUMBAR FUSION     Social History   Occupational History  . Disabled Unemployed   Social History Main Topics  . Smoking status: Never Smoker  . Smokeless tobacco: Never Used  . Alcohol use No  . Drug use: No  . Sexual activity: Not on file

## 2017-07-09 ENCOUNTER — Telehealth (INDEPENDENT_AMBULATORY_CARE_PROVIDER_SITE_OTHER): Payer: Self-pay | Admitting: Specialist

## 2017-07-09 NOTE — Telephone Encounter (Signed)
Patient wants you to call her back. 

## 2017-07-12 NOTE — Telephone Encounter (Signed)
I called patient.  We are awaiting PCP clearance to schedule surgery.  She stated she has an appointment on Monday for that.

## 2017-07-15 DIAGNOSIS — E118 Type 2 diabetes mellitus with unspecified complications: Secondary | ICD-10-CM | POA: Diagnosis not present

## 2017-07-15 DIAGNOSIS — M545 Low back pain: Secondary | ICD-10-CM | POA: Diagnosis not present

## 2017-07-15 DIAGNOSIS — I10 Essential (primary) hypertension: Secondary | ICD-10-CM | POA: Diagnosis not present

## 2017-07-15 DIAGNOSIS — Z23 Encounter for immunization: Secondary | ICD-10-CM | POA: Diagnosis not present

## 2017-08-09 ENCOUNTER — Encounter: Payer: Self-pay | Admitting: Cardiovascular Disease

## 2017-08-09 ENCOUNTER — Ambulatory Visit (INDEPENDENT_AMBULATORY_CARE_PROVIDER_SITE_OTHER): Payer: Medicare Other | Admitting: Cardiovascular Disease

## 2017-08-09 VITALS — BP 148/70 | HR 100 | Ht 65.0 in | Wt 177.0 lb

## 2017-08-09 DIAGNOSIS — I10 Essential (primary) hypertension: Secondary | ICD-10-CM | POA: Diagnosis not present

## 2017-08-09 DIAGNOSIS — Z01818 Encounter for other preprocedural examination: Secondary | ICD-10-CM | POA: Diagnosis not present

## 2017-08-09 DIAGNOSIS — E118 Type 2 diabetes mellitus with unspecified complications: Secondary | ICD-10-CM

## 2017-08-09 DIAGNOSIS — E785 Hyperlipidemia, unspecified: Secondary | ICD-10-CM | POA: Diagnosis not present

## 2017-08-09 DIAGNOSIS — R9431 Abnormal electrocardiogram [ECG] [EKG]: Secondary | ICD-10-CM

## 2017-08-09 DIAGNOSIS — Z794 Long term (current) use of insulin: Secondary | ICD-10-CM

## 2017-08-09 NOTE — Progress Notes (Signed)
CARDIOLOGY CONSULT NOTE  Patient ID: [name] MRN: [id number] DOB/AGE: 66/22/1952 66 y.o.  Admit date: (Not on file) Primary Physician: Mirna Mires, MD Referring Physician: Mirna Mires, MD  Reason for Consultation: Preoperative risk stratification  HPI: [name] is a 66 y.o. female who is being seen today for the evaluation of preoperative risk stratification at the request of Mirna Mires, MD.   I have personally reviewed all documentation, labs, radiographic and cardiovascular studies, and independently interpreted all ECG's.  Past medical history includes hypertension, insulin-dependent diabetes mellitus, and hyperlipidemia.  She was evaluated by orthopedic surgery on 06/25/17 for spinal stenosis of the lumbar region. It is recommended she undergo a 5 level thoracolumbar fusion.  Labs 04/10/17: Hemoglobin A1c 10.2% which is markedly elevated, BUN 10, creatinine 0.76, sodium 138, potassium 4.3.  She underwent an unremarkable nuclear stress test on 05/17/2009.  ECG performed in the office today which I ordered and personally interpreted demonstrated sinus rhythm with late R-wave transition and T-wave inversions in leads I and aVL, suggestive of high lateral ischemia.  The patient denies any symptoms of chest pain, palpitations, shortness of breath, lightheadedness, dizziness, leg swelling, orthopnea, PND, and syncope.  She has lower back pain and pain which radiates down her right leg. It is also uncomfortable for her to sleep on her right side.    No Known Allergies  Current Outpatient Prescriptions  Medication Sig Dispense Refill  . Alogliptin-Metformin HCl 12.5-500 MG TABS Take by mouth.    Marland Kitchen amitriptyline (ELAVIL) 10 MG tablet     . amLODipine (NORVASC) 5 MG tablet Take 5 mg by mouth daily.      . diclofenac (CATAFLAM) 50 MG tablet Take 1 tablet (50 mg total) by mouth 3 (three) times daily. 90 tablet 3  . furosemide (LASIX) 20 MG tablet  Take 20 mg by mouth daily.    . hydrochlorothiazide (MICROZIDE) 12.5 MG capsule Take 12.5 mg by mouth daily.    Marland Kitchen HYDROcodone-acetaminophen (VICODIN) 5-500 MG per tablet Take 1 tablet by mouth every 4 (four) hours as needed.      . insulin NPH-insulin regular (NOVOLIN 70/30) (70-30) 100 UNIT/ML injection Inject 54 Units into the skin every morning.      Marland Kitchen losartan (COZAAR) 50 MG tablet Take 50 mg by mouth daily.    . metFORMIN (GLUCOPHAGE) 1000 MG tablet Take 1,000 mg by mouth 2 (two) times daily with a meal.      . methocarbamol (ROBAXIN) 500 MG tablet Take 500 mg by mouth 4 (four) times daily.    Marland Kitchen omeprazole (PRILOSEC) 20 MG capsule Take 20 mg by mouth daily.      . pioglitazone (ACTOS) 15 MG tablet Take 15 mg by mouth daily.    . potassium chloride (K-DUR) 10 MEQ tablet Take 10 mEq by mouth daily.    . simvastatin (ZOCOR) 20 MG tablet Take 20 mg by mouth at bedtime.      . traMADol (ULTRAM) 50 MG tablet Take 1 tablet (50 mg total) by mouth every 6 (six) hours as needed for moderate pain. 40 tablet 0   No current facility-administered medications for this visit.     Past Medical History:  Diagnosis Date  . Asthma   . Bronchitis   . Diabetes mellitus   . Hyperlipidemia   . Hypertension   . Low back pain   . Migraine   . Osteoarthritis   . Sciatica   . Varicose veins   .  Vertigo     Past Surgical History:  Procedure Laterality Date  . ABDOMINAL HYSTERECTOMY    . CARPAL TUNNEL RELEASE    . CESAREAN SECTION    . INGUINAL HERNIA REPAIR    . LUMBAR FUSION      Social History   Social History  . Marital status: Single    Spouse name: N/A  . Number of children: N/A  . Years of education: 12th    Occupational History  . Disabled Unemployed   Social History Main Topics  . Smoking status: Never Smoker  . Smokeless tobacco: Never Used  . Alcohol use No  . Drug use: No  . Sexual activity: Not on file   Other Topics Concern  . Not on file   Social History Narrative     No caffeine use      No family history of premature CAD in 1st degree relatives.  Current Meds  Medication Sig  . Alogliptin-Metformin HCl 12.5-500 MG TABS Take by mouth.  Marland Kitchen amitriptyline (ELAVIL) 10 MG tablet   . amLODipine (NORVASC) 5 MG tablet Take 5 mg by mouth daily.    . diclofenac (CATAFLAM) 50 MG tablet Take 1 tablet (50 mg total) by mouth 3 (three) times daily.  . furosemide (LASIX) 20 MG tablet Take 20 mg by mouth daily.  . hydrochlorothiazide (MICROZIDE) 12.5 MG capsule Take 12.5 mg by mouth daily.  Marland Kitchen HYDROcodone-acetaminophen (VICODIN) 5-500 MG per tablet Take 1 tablet by mouth every 4 (four) hours as needed.    . insulin NPH-insulin regular (NOVOLIN 70/30) (70-30) 100 UNIT/ML injection Inject 54 Units into the skin every morning.    Marland Kitchen losartan (COZAAR) 50 MG tablet Take 50 mg by mouth daily.  . metFORMIN (GLUCOPHAGE) 1000 MG tablet Take 1,000 mg by mouth 2 (two) times daily with a meal.    . methocarbamol (ROBAXIN) 500 MG tablet Take 500 mg by mouth 4 (four) times daily.  Marland Kitchen omeprazole (PRILOSEC) 20 MG capsule Take 20 mg by mouth daily.    . pioglitazone (ACTOS) 15 MG tablet Take 15 mg by mouth daily.  . potassium chloride (K-DUR) 10 MEQ tablet Take 10 mEq by mouth daily.  . simvastatin (ZOCOR) 20 MG tablet Take 20 mg by mouth at bedtime.    . traMADol (ULTRAM) 50 MG tablet Take 1 tablet (50 mg total) by mouth every 6 (six) hours as needed for moderate pain.      Review of systems complete and found to be negative unless listed above in HPI    Physical exam Blood pressure (!) 148/70, pulse 100, height 5\' 5"  (1.651 m), weight 177 lb (80.3 kg), SpO2 94 %. General: NAD Neck: No JVD, no thyromegaly or thyroid nodule.  Lungs: Clear to auscultation bilaterally with normal respiratory effort. CV: Nondisplaced PMI. Regular rate and rhythm, normal S1/S2, no S3/S4, no murmur.  No peripheral edema.  No carotid bruit.    Abdomen: Soft, nontender, no distention.  Skin: Intact  without lesions or rashes.  Neurologic: Alert and oriented x 3.  Psych: Normal affect. Extremities: No clubbing or cyanosis.  HEENT: Normal.   ECG: Most recent ECG reviewed.   Labs: Lab Results  Component Value Date/Time   K 3.9 06/08/2010 04:15 AM   BUN 7 06/08/2010 04:15 AM   CREATININE 0.91 06/08/2010 04:15 AM   ALT 18 05/31/2010 12:14 PM   TSH 0.443 (L) 04/11/2015 02:29 PM   HGB 8.6 (L) 06/10/2010 04:50 AM     Lipids: Lab  Results  Component Value Date/Time   LDLCALC 94 11/03/2009   CHOL 157 11/03/2009   TRIG 171 11/03/2009   HDL 29 11/03/2009        ASSESSMENT AND PLAN:  1. Preoperative risk stratification: ECG is suggestive of high lateral ischemia. Cardiovascular risk factors include hypertension, insulin-dependent type 2 diabetes mellitus, and hyperlipidemia. I am unable to assess exercise tolerance due to severe lower back pain. I will obtain a nuclear stress test to assess for significant ischemia. Utilizing the Revised Cardiac Risk Index, she is at a 6.6% (moderate risk) of adverse outcomes including myocardial infarction, pulmonary edema, ventricular fibrillation, cardiac arrest, or complete heart block. Nuclear stress testing will help to more accurately assess this risk.  2. Abnormal ECG: ECG is suggestive of high lateral ischemia. She is a symptomatically from a cardiac standpoint. I am unable to assess exercise tolerance due to severe lower back pain. I will obtain a nuclear stress test to assess for significant ischemia.  3. Hypertension: Mildly elevated and likely being driven by lower back pain. No changes to therapy at this time.  4. Hyperlipidemia: Continue statin therapy.  5. Type 2 insulin-dependent diabetes mellitus: HbA1c was markedly elevated in June 2018. I do not have her most recent value.   Disposition: Follow up to be determined  Signed: Prentice DockerSuresh Koneswaran, M.D., F.A.C.C.  08/09/2017, 1:40 PM

## 2017-08-09 NOTE — Patient Instructions (Signed)
Your physician recommends that you schedule a follow-up appointment in:  To be determined with Dr.koneswaran after testing    Your physician has requested that you have a lexiscan myoview. For further information please visit [email or web address] Please follow instruction sheet, as given.     Your physician recommends that you continue on your current medications as directed. Please refer to the Current Medication list given to you today.      No lab work ordered today      Thank you for choosing [facility] !

## 2017-08-16 ENCOUNTER — Encounter (HOSPITAL_COMMUNITY): Payer: Self-pay

## 2017-08-16 ENCOUNTER — Encounter (HOSPITAL_BASED_OUTPATIENT_CLINIC_OR_DEPARTMENT_OTHER)
Admission: RE | Admit: 2017-08-16 | Discharge: 2017-08-16 | Disposition: A | Discharge status: Home / Self Care | Payer: Medicare Other | Source: Ambulatory Visit | Attending: Cardiovascular Disease | Admitting: Cardiovascular Disease

## 2017-08-16 ENCOUNTER — Encounter (HOSPITAL_COMMUNITY)
Admission: RE | Admit: 2017-08-16 | Discharge: 2017-08-16 | Disposition: A | Discharge status: Home / Self Care | Payer: Medicare Other | Source: Ambulatory Visit | Attending: Cardiovascular Disease | Admitting: Cardiovascular Disease

## 2017-08-16 ENCOUNTER — Telehealth: Payer: Self-pay | Admitting: *Deleted

## 2017-08-16 DIAGNOSIS — Z01818 Encounter for other preprocedural examination: Secondary | ICD-10-CM

## 2017-08-16 DIAGNOSIS — R9431 Abnormal electrocardiogram [ECG] [EKG]: Secondary | ICD-10-CM | POA: Insufficient documentation

## 2017-08-16 LAB — NM MYOCAR MULTI W/SPECT W/WALL MOTION / EF
CHL CUP NUCLEAR SSS: 1
LV sys vol: 28 mL
LVDIAVOL: 65 mL (ref 46–106)
NUC STRESS TID: 1.14
Peak HR: 102 {beats}/min
RATE: 0.24
Rest HR: 85 {beats}/min
SDS: 1
SRS: 0

## 2017-08-16 MED ORDER — REGADENOSON 0.4 MG/5ML IV SOLN
INTRAVENOUS | Status: AC
  Administered 2017-08-16: 0.4 mg via INTRAVENOUS
  Filled 2017-08-16: qty 5

## 2017-08-16 MED ORDER — TECHNETIUM TC 99M TETROFOSMIN IV KIT
30.0000 | PACK | Freq: Once | INTRAVENOUS | Status: AC | PRN
  Administered 2017-08-16: 32 via INTRAVENOUS

## 2017-08-16 MED ORDER — SODIUM CHLORIDE 0.9% FLUSH
INTRAVENOUS | Status: AC
  Administered 2017-08-16: 10 mL via INTRAVENOUS
  Filled 2017-08-16: qty 10

## 2017-08-16 MED ORDER — TECHNETIUM TC 99M TETROFOSMIN IV KIT
10.0000 | PACK | Freq: Once | INTRAVENOUS | Status: AC | PRN
  Administered 2017-08-16: 10 via INTRAVENOUS

## 2017-08-16 NOTE — Telephone Encounter (Signed)
-----   Message from Laqueta LindenSuresh A Koneswaran, MD sent at 08/16/2017  2:47 PM EDT ----- Low risk study.

## 2017-08-16 NOTE — Telephone Encounter (Signed)
Called patient with test results. No answer. Left message to call back.  

## 2017-09-10 NOTE — Pre-Procedure Instructions (Signed)
Angela HiddenMargaret L Reid  09/10/2017      [id number] - [place], [place] - [address] [place] [address] Phone: [phone] Fax: [phone]    Your procedure is scheduled on Tues. Dec. 4  [facility] at 5:30 A.M.  Call this number if you have problems the morning of surgery:  [phone]   Remember:  Do not eat food or drink liquids after midnight on Mon. Dec. 3   Take these medicines the morning of surgery with A SIP OF WATER : amlodipine (norvasc)                       7 days prior to surgery STOP taking any Aspirin(unless otherwise instructed by your surgeon), Aleve, Naproxen, Ibuprofen, Motrin, Advil, Goody's, BC's, all herbal medications, fish oil, and all vitamins                 How to Manage Your Diabetes Before and After Surgery  Why is it important to control my blood sugar before and after surgery? . Improving blood sugar levels before and after surgery helps healing and can limit problems. . A way of improving blood sugar control is eating a healthy diet by: o  Eating less sugar and carbohydrates o  Increasing activity/exercise o  Talking with your doctor about reaching your blood sugar goals . High blood sugars (greater than 180 mg/dL) can raise your risk of infections and slow your recovery, so you will need to focus on controlling your diabetes during the weeks before surgery. . Make sure that the doctor who takes care of your diabetes knows about your planned surgery including the date and location.  How do I manage my blood sugar before surgery? . Check your blood sugar at least 4 times a day, starting 2 days before surgery, to make sure that the level is not too high or low. o Check your blood sugar the morning of your surgery when you wake up and every 2 hours until you get to the Short Stay unit. . If your blood sugar is less than 70 mg/dL, you will need to treat for low blood  sugar: o Do not take insulin. o Treat a low blood sugar (less than 70 mg/dL) with  cup of clear juice (cranberry or apple), 4 glucose tablets, OR glucose gel. Recheck blood sugar in 15 minutes after treatment (to make sure it is greater than 70 mg/dL). If your blood sugar is not greater than 70 mg/dL on recheck, call [phone] o  for further instructions. . Report your blood sugar to the short stay nurse when you get to Short Stay.  . If you are admitted to the hospital after surgery: o Your blood sugar will be checked by the staff and you will probably be given insulin after surgery (instead of oral diabetes medicines) to make sure you have good blood sugar levels. o The goal for blood sugar control after surgery is 80-180 mg/dL.      WHAT DO I DO ABOUT MY DIABETES MEDICATION?   Marland Kitchen. Do not take oral diabetes medicines (pills) the morning of surgery.  . THE NIGHT BEFORE SURGERY, take ___37___ units of ___70/30________insulin.       . THE MORNING OF SURGERY, take ___none____ units of ___70/30_______insulin.     Do not wear jewelry, make-up or nail polish.  Do not wear lotions, powders, or perfumes, or deoderant.  Do not shave 48 hours prior to surgery.  Men may shave face and neck.  Do not bring valuables to the hospital.  [facility] is not responsible for any belongings or valuables.  Contacts, dentures or bridgework may not be worn into surgery.  Leave your suitcase in the car.  After surgery it may be brought to your room.  For patients admitted to the hospital, discharge time will be determined by your treatment team.  Patients discharged the day of surgery will not be allowed to drive home.    Special instructions:  - Preparing For Surgery  Before surgery, you can play an important role. Because skin is not sterile, your skin needs to be as free of germs as possible. You can reduce the number of germs on your skin by washing with CHG (chlorahexidine  gluconate) Soap before surgery.  CHG is an antiseptic cleaner which kills germs and bonds with the skin to continue killing germs even after washing.  Please do not use if you have an allergy to CHG or antibacterial soaps. If your skin becomes reddened/irritated stop using the CHG.  Do not shave (including legs and underarms) for at least 48 hours prior to first CHG shower. It is OK to shave your face.  Please follow these instructions carefully.   1. Shower the NIGHT BEFORE SURGERY and the MORNING OF SURGERY with CHG.   2. If you chose to wash your hair, wash your hair first as usual with your normal shampoo.  3. After you shampoo, rinse your hair and body thoroughly to remove the shampoo.  4. Use CHG as you would any other liquid soap. You can apply CHG directly to the skin and wash gently with a scrungie or a clean washcloth.   5. Apply the CHG Soap to your body ONLY FROM THE NECK DOWN.  Do not use on open wounds or open sores. Avoid contact with your eyes, ears, mouth and genitals (private parts). Wash Face and genitals (private parts)  with your normal soap.  6. Wash thoroughly, paying special attention to the area where your surgery will be performed.  7. Thoroughly rinse your body with warm water from the neck down.  8. DO NOT shower/wash with your normal soap after using and rinsing off the CHG Soap.  9. Pat yourself dry with a CLEAN TOWEL.  10. Wear CLEAN PAJAMAS to bed the night before surgery, wear comfortable clothes the morning of surgery  11. Place CLEAN SHEETS on your bed the night of your first shower and DO NOT SLEEP WITH PETS.    Day of Surgery: Do not apply any deodorants/lotions. Please wear clean clothes to the hospital/surgery center.      Please read over the following fact sheets that you were given. Coughing and Deep Breathing, MRSA Information and Surgical Site Infection Prevention

## 2017-09-11 ENCOUNTER — Encounter (HOSPITAL_COMMUNITY): Payer: Self-pay

## 2017-09-11 ENCOUNTER — Telehealth (INDEPENDENT_AMBULATORY_CARE_PROVIDER_SITE_OTHER): Payer: Self-pay | Admitting: Radiology

## 2017-09-11 ENCOUNTER — Encounter (INDEPENDENT_AMBULATORY_CARE_PROVIDER_SITE_OTHER): Payer: Self-pay | Admitting: Specialist

## 2017-09-11 ENCOUNTER — Encounter (HOSPITAL_COMMUNITY)
Admission: RE | Admit: 2017-09-11 | Discharge: 2017-09-11 | Disposition: A | Discharge status: Home / Self Care | Payer: Medicare Other | Source: Ambulatory Visit | Attending: Specialist | Admitting: Specialist

## 2017-09-11 ENCOUNTER — Other Ambulatory Visit: Payer: Self-pay

## 2017-09-11 ENCOUNTER — Ambulatory Visit (INDEPENDENT_AMBULATORY_CARE_PROVIDER_SITE_OTHER): Payer: Medicare Other | Admitting: Specialist

## 2017-09-11 VITALS — BP 144/73 | HR 88 | Ht 65.0 in | Wt 187.0 lb

## 2017-09-11 DIAGNOSIS — I8390 Asymptomatic varicose veins of unspecified lower extremity: Secondary | ICD-10-CM | POA: Insufficient documentation

## 2017-09-11 DIAGNOSIS — M5430 Sciatica, unspecified side: Secondary | ICD-10-CM | POA: Diagnosis not present

## 2017-09-11 DIAGNOSIS — I10 Essential (primary) hypertension: Secondary | ICD-10-CM | POA: Diagnosis present

## 2017-09-11 DIAGNOSIS — M25551 Pain in right hip: Secondary | ICD-10-CM | POA: Diagnosis not present

## 2017-09-11 DIAGNOSIS — R42 Dizziness and giddiness: Secondary | ICD-10-CM | POA: Diagnosis not present

## 2017-09-11 DIAGNOSIS — M4805 Spinal stenosis, thoracolumbar region: Secondary | ICD-10-CM

## 2017-09-11 DIAGNOSIS — M545 Low back pain: Secondary | ICD-10-CM | POA: Insufficient documentation

## 2017-09-11 DIAGNOSIS — R079 Chest pain, unspecified: Secondary | ICD-10-CM | POA: Insufficient documentation

## 2017-09-11 DIAGNOSIS — Z01812 Encounter for preprocedural laboratory examination: Secondary | ICD-10-CM | POA: Diagnosis not present

## 2017-09-11 DIAGNOSIS — G5700 Lesion of sciatic nerve, unspecified lower limb: Secondary | ICD-10-CM | POA: Insufficient documentation

## 2017-09-11 DIAGNOSIS — E119 Type 2 diabetes mellitus without complications: Secondary | ICD-10-CM | POA: Diagnosis not present

## 2017-09-11 DIAGNOSIS — J438 Other emphysema: Secondary | ICD-10-CM | POA: Diagnosis not present

## 2017-09-11 DIAGNOSIS — M539 Dorsopathy, unspecified: Secondary | ICD-10-CM

## 2017-09-11 DIAGNOSIS — E785 Hyperlipidemia, unspecified: Secondary | ICD-10-CM | POA: Diagnosis not present

## 2017-09-11 DIAGNOSIS — G43909 Migraine, unspecified, not intractable, without status migrainosus: Secondary | ICD-10-CM | POA: Diagnosis not present

## 2017-09-11 DIAGNOSIS — M2560 Stiffness of unspecified joint, not elsewhere classified: Secondary | ICD-10-CM | POA: Insufficient documentation

## 2017-09-11 LAB — CBC
HCT: 47.5 % — ABNORMAL HIGH (ref 36.0–46.0)
HEMOGLOBIN: 15.6 g/dL — AB (ref 12.0–15.0)
MCH: 31.3 pg (ref 26.0–34.0)
MCHC: 32.8 g/dL (ref 30.0–36.0)
MCV: 95.2 fL (ref 78.0–100.0)
Platelets: 251 10*3/uL (ref 150–400)
RBC: 4.99 MIL/uL (ref 3.87–5.11)
RDW: 13.4 % (ref 11.5–15.5)
WBC: 11.9 10*3/uL — ABNORMAL HIGH (ref 4.0–10.5)

## 2017-09-11 LAB — COMPREHENSIVE METABOLIC PANEL
ALK PHOS: 80 U/L (ref 38–126)
ALT: 21 U/L (ref 14–54)
ANION GAP: 10 (ref 5–15)
AST: 30 U/L (ref 15–41)
Albumin: 4.2 g/dL (ref 3.5–5.0)
BUN: 11 mg/dL (ref 6–20)
CALCIUM: 9.7 mg/dL (ref 8.9–10.3)
CO2: 26 mmol/L (ref 22–32)
CREATININE: 0.7 mg/dL (ref 0.44–1.00)
Chloride: 100 mmol/L — ABNORMAL LOW (ref 101–111)
Glucose, Bld: 175 mg/dL — ABNORMAL HIGH (ref 65–99)
Potassium: 3.8 mmol/L (ref 3.5–5.1)
SODIUM: 136 mmol/L (ref 135–145)
TOTAL PROTEIN: 7.5 g/dL (ref 6.5–8.1)
Total Bilirubin: 0.6 mg/dL (ref 0.3–1.2)

## 2017-09-11 LAB — APTT: aPTT: 30 seconds (ref 24–36)

## 2017-09-11 LAB — URINALYSIS, ROUTINE W REFLEX MICROSCOPIC
BILIRUBIN URINE: NEGATIVE
Glucose, UA: NEGATIVE mg/dL
Hgb urine dipstick: NEGATIVE
Ketones, ur: 5 mg/dL — AB
Nitrite: NEGATIVE
PH: 5 (ref 5.0–8.0)
Protein, ur: NEGATIVE mg/dL
SPECIFIC GRAVITY, URINE: 1.016 (ref 1.005–1.030)

## 2017-09-11 LAB — SURGICAL PCR SCREEN
MRSA, PCR: NEGATIVE
STAPHYLOCOCCUS AUREUS: NEGATIVE

## 2017-09-11 LAB — HEMOGLOBIN A1C
HEMOGLOBIN A1C: 10.2 % — AB (ref 4.8–5.6)
Mean Plasma Glucose: 246.04 mg/dL

## 2017-09-11 LAB — PROTIME-INR
INR: 1.06
PROTHROMBIN TIME: 13.7 s (ref 11.4–15.2)

## 2017-09-11 NOTE — Progress Notes (Signed)
[facility]. Barbaraann FasterNitka's office.

## 2017-09-11 NOTE — Patient Instructions (Signed)
Plan: Plan:Avoid bending, stooping and avoid lifting weights greater than 10 lbs. Avoid prolong standing and walking. Order for a new walker with wheels. Surgery scheduling secretary Tivis RingerSherri Billings, will call you in the next week to schedule for surgery.  Surgery recommended is a 5 level thoracolumbar fusion T10 to L3-S1 this would be done with rods, screws and L1-2 and L2-3 cages with local bone graft and allograft (donor bone graft). Take hydrocodone for for pain. Risk of surgery includes risk of infection 1 in 100 patients, bleeding 5% chance you would need a transfusion.   Risk to the nerves is one in 10,000. You will need to use a brace for 3 months and wean from the brace on the 4th month. Expect improved walking and standing tolerance. Expect relief of leg pain but numbness may persist depending on the length and degree of pressure that has been present.

## 2017-09-11 NOTE — Progress Notes (Signed)
PC[facility] - [facility]  Cardiologist - Dr. Prentice DockerSuresh Koneswaran- 08/09/17  Chest x-ray - Denies  EKG - 08/09/17 (E)  Stress Test - 08/16/17 (E)  ECHO - 05/2009 (E)   Cardiac Cath - Denies  Sleep Study - No  CPAP - None  HA1C- Pending Fasting Blood Sugar - 123, Today 181 Checks Blood Sugar ___2__ times a day  This Clinical research associatewriter spoke with Waldo LaineSherrie Billings from Dr. Barbaraann FasterNitka's office, and she instructed the pt to STOP taking Aspirin today until after surgery. Information relayed in the instructions. Chart will be given to anesthesia for review due to cardiac history.  Pt denies having chest pain, sob, or fever at this time. All instructions explained to the pt, with a verbal understanding of the material. Pt agrees to go over the instructions while at home for a better understanding. The opportunity to ask questions was provided.

## 2017-09-11 NOTE — Progress Notes (Signed)
Office Visit Note   Patient: [name]           Date of Birth: 1951-02-26           MRN: [id number] Visit Date: 09/11/2017              Requested by: Mirna MiresHill, Gerald, MD [address] [place], [place] [address] PCP: Mirna MiresHill, Gerald, MD   Assessment & Plan: Visit Diagnoses: No diagnosis found.  Plan: Plan:Avoid bending, stooping and avoid lifting weights greater than 10 lbs. Avoid prolong standing and walking. Order for a new walker with wheels. Surgery scheduling secretary Tivis RingerSherri Billings, will call you in the next week to schedule for surgery.  Surgery recommended is a 5 level thoracolumbar fusion T10 to L3-S1 this would be done with rods, screws and L1-2 and L2-3 cages with local bone graft and allograft (donor bone graft). Take hydrocodone for for pain. Risk of surgery includes risk of infection 1 in 100 patients, bleeding 5% chance you would need a transfusion.   Risk to the nerves is one in 10,000. You will need to use a brace for 3 months and wean from the brace on the 4th month. Expect improved walking and standing tolerance. Expect relief of leg pain but numbness may persist depending on the length and degree of pressure that has been present.  Follow-Up Instructions: No Follow-up on file.   Orders:  No orders of the defined types were placed in this encounter.  No orders of the defined types were placed in this encounter.     Procedures: No procedures performed   Clinical Data: No additional findings.   Subjective: Chief Complaint  Patient presents with  . Lower Back - Follow-up    She is scheduled for surgery on 09/17/17    66 year old female with worsening back pain and pain into the right leg all the way into the right toes. She is unable to walk more than house to mail box without taking a stick to assist with walking. Then she has to sit on the porch to rest. EKg was read as abnormal and she has since undergone a nuclear stress test which has  returned low risk. No bowel or bladder difficulties. Diabetes is to be tested with preoperative lab work tody.     Review of Systems  Constitutional: Negative.   HENT: Negative.   Eyes: Negative.   Respiratory: Negative.   Cardiovascular: Negative.   Gastrointestinal: Negative.   Endocrine: Negative.   Genitourinary: Negative.   Musculoskeletal: Negative.   Skin: Negative.   Allergic/Immunologic: Negative.   Neurological: Negative.   Hematological: Negative.   Psychiatric/Behavioral: Negative.      Objective: Vital Signs: BP (!) 144/73 (BP Location: Left Arm, Patient Position: Sitting)   Pulse 88   Ht 5\' 5"  (1.651 m)   Wt 187 lb (84.8 kg)   BMI 31.12 kg/m   Physical Exam  Constitutional: She is oriented to person, place, and time. She appears well-developed and well-nourished.  HENT:  Head: Normocephalic and atraumatic.  Eyes: EOM are normal. Pupils are equal, round, and reactive to light.  Neck: Normal range of motion. Neck supple.  Pulmonary/Chest: Effort normal and breath sounds normal.  Abdominal: Soft. Bowel sounds are normal.  Neurological: She is alert and oriented to person, place, and time.  Skin: Skin is warm and dry.  Psychiatric: She has a normal mood and affect. Her behavior is normal. Judgment and thought content normal.  Back Exam   Tenderness  The patient is experiencing tenderness in the lumbar.  Range of Motion  Extension: abnormal  Flexion: abnormal  Lateral bend right: abnormal  Lateral bend left: abnormal  Rotation right: abnormal  Rotation left: abnormal   Muscle Strength  Right Quadriceps:  5/5  Left Quadriceps:  5/5  Right Hamstrings:  5/5  Left Hamstrings:  5/5   Tests  Straight leg raise right: negative Straight leg raise left: negative  Reflexes  Patellar: normal Achilles: normal Biceps: normal Babinski's sign: normal   Other  Toe walk: normal Heel walk: normal Gait: abductor lurch  Erythema: no back  redness Scars: present      Specialty Comments:  No specialty comments available.  Imaging: No results found.   PMFS History: Patient Active Problem List   Diagnosis Date Noted  . Pain in right hip 03/18/2017  . Stiffness of joint, not elsewhere classified, other specified site 08/04/2013  . Leg weakness, bilateral 08/04/2013  . CHEST PAIN UNSPECIFIED 05/10/2009  . BENIGN POSITIONAL VERTIGO 07/30/2007  . VARICOSE VEINS, LOWER EXTREMITIES 07/30/2007  . SCIATICA, CHRONIC 07/30/2007  . DIABETES MELLITUS, TYPE II 11/18/2006  . HYPERLIPIDEMIA 11/18/2006  . MIGRAINE HEADACHE 11/18/2006  . LESION, SCIATIC NERVE 11/18/2006  . HYPERTENSION 11/18/2006  . HYPOTENSION, ORTHOSTATIC 11/18/2006  . BRONCHITIS NOS 11/18/2006  . Other emphysema (HCC) 11/18/2006  . ASTHMA 11/18/2006  . OSTEOARTHRITIS 11/18/2006  . LOW BACK PAIN 11/18/2006  . VERTIGO 11/18/2006   Past Medical History:  Diagnosis Date  . Asthma   . Bronchitis   . Diabetes mellitus   . Hyperlipidemia   . Hypertension   . Low back pain   . Migraine   . Osteoarthritis   . Sciatica   . Varicose veins   . Vertigo     Family History  Problem Relation Age of Onset  . Lung cancer Mother   . Stroke Father   . Heart failure Sister   . Heart failure Sister     Past Surgical History:  Procedure Laterality Date  . ABDOMINAL HYSTERECTOMY    . CARPAL TUNNEL RELEASE    . CESAREAN SECTION    . INGUINAL HERNIA REPAIR    . LUMBAR FUSION     Social History   Occupational History  . Occupation: Disabled    Employer: UNEMPLOYED  Tobacco Use  . Smoking status: Never Smoker  . Smokeless tobacco: Never Used  Substance and Sexual Activity  . Alcohol use: No    Alcohol/week: 0.0 oz  . Drug use: No  . Sexual activity: Not on file

## 2017-09-11 NOTE — Progress Notes (Signed)
[name]            09/10/2017                          [id number] - [place], [place] - [address] [place] [id number] Phone: [phone] Fax: [phone]              Your procedure is scheduled on Tues. Dec. 4            [facility] 5:30 A.M.            Call this number if you have problems the morning of surgery:            [phone]             Remember:            Do not eat food or drink liquids after midnight on Mon. Dec. 3           Take these medicines the morning of surgery with A SIP OF WATER:  Amlodipine (norvasc). If needed TraMADol Janean Sark(ULTRAM) for pain.  Follow your doctor's instruction regarding Aspirin.        As of today,STOP taking any Aspirin(unless otherwise instructed by your surgeon), Aleve, Naproxen, Ibuprofen, Motrin, Advil, Goody's, BC's, all herbal medications, fish oil, and all vitamins  HOW TO MANAGE YOUR DIABETES BEFORE AND AFTER SURGERY  Why is it important to control my blood sugar before and after surgery?  Improving blood sugar levels before and after surgery helps healing and can limit problems.  A way of improving blood sugar control is eating a healthy diet by: ?  Eating less sugar and carbohydrates ?  Increasing activity/exercise ?  Talking with your doctor about reaching your blood sugar goals  High blood sugars (greater than 180 mg/dL) can raise your risk of infections and slow your recovery, so you will need to focus on controlling your diabetes during the weeks before surgery.  Make sure that the doctor who takes care of your diabetes knows about your planned surgery including the date and location.  How do I manage my blood sugar before surgery?  Check your blood sugar at least 4 times a day, starting 2 days before surgery, to make sure that the level is not too high or low. ? Check your blood sugar the morning of your surgery when you wake  up and every 2 hours until you get to the Short Stay unit.  If your blood sugar is less than 70 mg/dL, you will need to treat for low blood sugar: ? Do not take insulin. ? Treat a low blood sugar (less than 70 mg/dL) with  cup of clear juice (cranberry or apple), 4glucose tablets, OR glucose gel. Recheck blood sugar in 15 minutes after treatment (to make sure it is greater than 70 mg/dL). If your blood sugar is not greater than 70 mg/dL on recheck, call [phone] ?  for further instructions.  Report your blood sugar to the short stay nurse when you get to Short Stay.   If you are admitted to the hospital after surgery: ? Your blood sugar will be checked by the staff and you will probably be given insulin after surgery (instead of oral diabetes medicines) to make sure you have good blood sugar levels. ? The goal for blood sugar control after surgery is 80-180 mg/dL.  WHAT  DO I DO ABOUT MY DIABETES MEDICATION?  Do not  take MetFORMIN (GLUCOPHAGE) the morning of surgery.   THE NIGHT BEFORE SURGERY, take ___37___ units of ___70/30________insulin.                                           THE MORNING OF SURGERY, take ___0____ units of ___70/30_______insulin.             Do not wear jewelry, make-up or nail polish.            Do not wear lotions, powders, or perfumes, or deoderant.            Do not shave 48 hours prior to surgery.  Men may shave face and neck.            Do not bring valuables to the hospital.            [facility] is not responsible for any belongings or valuables.  Contacts, dentures or bridgework may not be worn into surgery.  Leave your suitcase in the car.  After surgery it may be brought to your room.  For patients admitted to the hospital, discharge time will be determined by your treatment team.  Patients discharged the day of surgery will not be allowed to drive home.    Special instructions:  [facility]- Preparing For Surgery  Before  surgery, you can play an important role. Because skin is not sterile, your skin needs to be as free of germs as possible. You can reduce the number of germs on your skin by washing with CHG (chlorahexidine gluconate) Soap before surgery.  CHG is an antiseptic cleaner which kills germs and bonds with the skin to continue killing germs even after washing.  Please do not use if you have an allergy to CHG or antibacterial soaps. If your skin becomes reddened/irritated stop using the CHG.  Do not shave (including legs and underarms) for at least 48 hours prior to first CHG shower. It is OK to shave your face.  Please follow these instructions carefully.                                                                                                                     1. Shower the NIGHT BEFORE SURGERY and the MORNING OF SURGERY with CHG.   2. If you chose to wash your hair, wash your hair first as usual with your normal shampoo.  3. After you shampoo, rinse your hair and body thoroughly to remove the shampoo.  4. Use CHG as you would any other liquid soap. You can apply CHG directly to the skin and wash gently with a scrungie or a clean washcloth.   5. Apply the CHG Soap to your body ONLY FROM THE NECK DOWN.  Do not use on open wounds or open sores. Avoid contact with your eyes, ears, mouth  and genitals (private parts). Wash Face and genitals (private parts)  with your normal soap.  6. Wash thoroughly, paying special attention to the area where your surgery will be performed.  7. Thoroughly rinse your body with warm water from the neck down.  8. DO NOT shower/wash with your normal soap after using and rinsing off the CHG Soap.  9. Pat yourself dry with a CLEAN TOWEL.  10. Wear CLEAN PAJAMAS to bed the night before surgery, wear comfortable clothes the morning of surgery  11. Place CLEAN SHEETS on your bed the night of your first shower and DO NOT SLEEP WITH PETS.  Day of  Surgery: Do not apply any deodorants/lotions. Please wear clean clothes to the hospital/surgery center.    Please read over the following fact sheets that you were given. Coughing and Deep Breathing, MRSA Information and Surgical Site Infection Prevention

## 2017-09-11 NOTE — Telephone Encounter (Signed)
FYI - Adri[facility] from [facility] called stating patient came in for pre-op work up.  Patient is scheduled for surgery with Dr. Otelia SergeantNitka, 09/17/17.  States patients urine results are abnormal, increased leukocytes, to many white blood cells and too much bacteria.

## 2017-09-12 ENCOUNTER — Encounter (HOSPITAL_COMMUNITY): Payer: Self-pay | Admitting: Vascular Surgery

## 2017-09-12 ENCOUNTER — Other Ambulatory Visit (INDEPENDENT_AMBULATORY_CARE_PROVIDER_SITE_OTHER): Payer: Self-pay | Admitting: Specialist

## 2017-09-12 ENCOUNTER — Encounter (HOSPITAL_COMMUNITY): Payer: Self-pay | Admitting: Certified Registered Nurse Anesthetist

## 2017-09-12 LAB — GLUCOSE, CAPILLARY: Glucose-Capillary: 181 mg/dL — ABNORMAL HIGH (ref 65–99)

## 2017-09-12 NOTE — Progress Notes (Addendum)
Anesthesia Chart Review:   Patient is a 66 year old female scheduled for right transforaminal lumbar interbody fusion L1-2, L2-3 posterior fusion T10, T11, T12, L1, L2 with T 11, T12 pedicle screws, superior sub-laminar hooks T10, local bone graft, allograft cancellous chips on 09/17/17 by Dr. Vira BrownsJames Nitka. (OR Room is booked from 7:30 AM - 4:15 PM.)   PCP is Dr. Mirna MiresGerald Hill who has signed clearance letter for surgery Cardiologist is Dr. Prentice DockerSuresh Koneswaran who saw pt for pre-op eval 08/09/17.   History includes never smoker, asthma, DM2, HLD, HTN, osteoarthritis, migraine, vertigo, varicose veins, hysterectomy, lumbar fusion. BMI 31.   Meds include amitriptyline, amlodipine, aspirin 81 mg (holding), Lasix, Novolin 70/30, losartan, metformin, KCl, Zocor, tramadol.  BP (!) 141/73   Pulse 92   Temp 36.9 C   Resp 20   Ht 5\' 5"  (1.651 m)   Wt 187 lb (84.8 kg)   SpO2 98%   BMI 31.12 kg/m   Preoperative labs reviewed.   - HbA1c 10.2, glucose 175 - UA consistent with UTI.  Dr. Otelia SergeantNitka has had pt start on cipro.   EKG 08/09/17: Sinus Rhythm. Poor R-wave progression -nonspecific -consider old anterior infarct. T-abnormality  -Lateral ischemia.   Nuclear stress test 08/16/17:   There was no ST segment deviation noted during stress.  This is a low risk study.  The left ventricular ejection fraction is normal (55-65%).  Small mild intensity reversible anterior defect. May represent mild ischemia vs differences in breast attenuation. Either findings supports low risk.   I left Sherrie in Dr. Barbaraann FasterNitka's office a message about pt's uncontrolled DM.    If glucose acceptable day of surgery, I anticipate pt can proceed as scheduled.   Rica Mastngela Amahri Dengel, FNP-BC [facility]Anesthesiology Phone: [phone] 09/12/2017 4:06 PM

## 2017-09-12 NOTE — Telephone Encounter (Signed)
I called patient to see if she having any symptoms, she said no but she has some medication there for it.  I asked what she had, she said Cipro 250mg  and she takes 1 pill 2 times a day, for 5 days.  I advised that she needs to start it today and per Dr. Otelia SergeantNitka they will repeat the UA prior to surgery that day (09/17/2017).  She states that she understands. And she will begin meds today.

## 2017-09-16 ENCOUNTER — Telehealth (INDEPENDENT_AMBULATORY_CARE_PROVIDER_SITE_OTHER): Payer: Self-pay | Admitting: Specialist

## 2017-09-16 NOTE — Telephone Encounter (Signed)
Katrina w/[facility] [phone]- please call in regards to patient surgery that scheduled for 09/17/17.  There is nothing on patients schedule for surgery tomorrow. Please call Laurelyn SickleKatina as she needs to know if their services are needed in the am for this patient.

## 2017-09-16 NOTE — Telephone Encounter (Signed)
I called and confirmed case is canceled for tomorrow.

## 2017-09-17 ENCOUNTER — Inpatient Hospital Stay (HOSPITAL_COMMUNITY): Admission: RE | Admit: 2017-09-17 | Payer: Medicare Other | Source: Ambulatory Visit | Admitting: Specialist

## 2017-09-17 ENCOUNTER — Encounter (HOSPITAL_COMMUNITY): Admission: RE | Payer: Self-pay | Source: Ambulatory Visit

## 2017-09-17 DIAGNOSIS — I10 Essential (primary) hypertension: Secondary | ICD-10-CM | POA: Diagnosis not present

## 2017-09-17 DIAGNOSIS — E1165 Type 2 diabetes mellitus with hyperglycemia: Secondary | ICD-10-CM | POA: Diagnosis not present

## 2017-09-17 DIAGNOSIS — M545 Low back pain: Secondary | ICD-10-CM | POA: Diagnosis not present

## 2017-09-17 LAB — TYPE AND SCREEN
ABO/RH(D): A POS
ANTIBODY SCREEN: POSITIVE
DONOR AG TYPE: NEGATIVE
Donor AG Type: NEGATIVE
Donor AG Type: NEGATIVE
UNIT DIVISION: 0
Unit division: 0
Unit division: 0

## 2017-09-17 LAB — BPAM RBC
BLOOD PRODUCT EXPIRATION DATE: 201812272359
BLOOD PRODUCT EXPIRATION DATE: 201812272359
Blood Product Expiration Date: 201812272359
UNIT TYPE AND RH: 6200
UNIT TYPE AND RH: 6200
Unit Type and Rh: 6200

## 2017-09-17 SURGERY — POSTERIOR LUMBAR FUSION 4 LEVEL
Anesthesia: General

## 2017-10-01 DIAGNOSIS — R300 Dysuria: Secondary | ICD-10-CM | POA: Diagnosis not present

## 2017-10-01 DIAGNOSIS — E1165 Type 2 diabetes mellitus with hyperglycemia: Secondary | ICD-10-CM | POA: Diagnosis not present

## 2017-10-01 DIAGNOSIS — N390 Urinary tract infection, site not specified: Secondary | ICD-10-CM | POA: Diagnosis not present

## 2017-10-07 ENCOUNTER — Other Ambulatory Visit (INDEPENDENT_AMBULATORY_CARE_PROVIDER_SITE_OTHER): Payer: Self-pay | Admitting: Specialist

## 2017-10-07 DIAGNOSIS — M48062 Spinal stenosis, lumbar region with neurogenic claudication: Secondary | ICD-10-CM

## 2017-10-07 DIAGNOSIS — M25551 Pain in right hip: Secondary | ICD-10-CM

## 2017-10-09 NOTE — Telephone Encounter (Signed)
Rx request 

## 2017-10-30 DIAGNOSIS — E1165 Type 2 diabetes mellitus with hyperglycemia: Secondary | ICD-10-CM | POA: Diagnosis not present

## 2017-12-02 DIAGNOSIS — E1165 Type 2 diabetes mellitus with hyperglycemia: Secondary | ICD-10-CM | POA: Diagnosis not present

## 2017-12-02 DIAGNOSIS — I10 Essential (primary) hypertension: Secondary | ICD-10-CM | POA: Diagnosis not present

## 2017-12-19 DIAGNOSIS — R69 Illness, unspecified: Secondary | ICD-10-CM | POA: Diagnosis not present

## 2018-01-01 DIAGNOSIS — M545 Low back pain: Secondary | ICD-10-CM | POA: Diagnosis not present

## 2018-01-01 DIAGNOSIS — E119 Type 2 diabetes mellitus without complications: Secondary | ICD-10-CM | POA: Diagnosis not present

## 2018-01-13 DIAGNOSIS — E118 Type 2 diabetes mellitus with unspecified complications: Secondary | ICD-10-CM | POA: Diagnosis not present

## 2018-01-13 DIAGNOSIS — I10 Essential (primary) hypertension: Secondary | ICD-10-CM | POA: Diagnosis not present

## 2018-01-20 DIAGNOSIS — E118 Type 2 diabetes mellitus with unspecified complications: Secondary | ICD-10-CM | POA: Diagnosis not present

## 2018-01-29 ENCOUNTER — Encounter (INDEPENDENT_AMBULATORY_CARE_PROVIDER_SITE_OTHER): Payer: Self-pay | Admitting: Specialist

## 2018-01-29 ENCOUNTER — Ambulatory Visit (INDEPENDENT_AMBULATORY_CARE_PROVIDER_SITE_OTHER): Payer: Medicare HMO | Admitting: Specialist

## 2018-01-29 VITALS — BP 139/76 | HR 84 | Ht 65.0 in | Wt 177.0 lb

## 2018-01-29 DIAGNOSIS — M5136 Other intervertebral disc degeneration, lumbar region: Secondary | ICD-10-CM | POA: Diagnosis not present

## 2018-01-29 DIAGNOSIS — M47815 Spondylosis without myelopathy or radiculopathy, thoracolumbar region: Secondary | ICD-10-CM

## 2018-01-29 DIAGNOSIS — M48062 Spinal stenosis, lumbar region with neurogenic claudication: Secondary | ICD-10-CM | POA: Diagnosis not present

## 2018-01-29 MED ORDER — ZOLPIDEM TARTRATE ER 6.25 MG PO TBCR
6.2500 mg | EXTENDED_RELEASE_TABLET | Freq: Every evening | ORAL | 0 refills | Status: DC | PRN
Start: 2018-01-29 — End: 2018-11-26

## 2018-01-29 NOTE — Patient Instructions (Signed)
Plan:Avoid bending, stooping and avoid lifting weights greater than 10 lbs. Avoid prolong standing and walking. Order for a new walker with wheels. Surgery scheduling secretary Sherri Billings, will call you in the next week to schedule for surgery.  Surgery recommended is a 5 level thoracolumbar fusion T10 to L3-S1 this would be done with rods, screws and L1-2 and L2-3 cages with local bone graft and allograft (donor bone graft). Take hydrocodone for for pain. Risk of surgery includes risk of infection 1 in 100 patients, bleeding 5% chance you would need a transfusion.   Risk to the nerves is one in 10,000. You will need to use a brace for 3 months and wean from the brace on the 4th month. Expect improved walking and standing tolerance. Expect relief of leg pain but numbness may persist depending on the length and degree of pressure that has been present.  

## 2018-01-29 NOTE — Progress Notes (Signed)
Office Visit Note   Patient: [name]           Date of Birth: 1950/12/17           MRN: [id number] Visit Date: 01/29/2018              Requested by: Mirna Mires, MD [address] [place], [place] [address] PCP: Mirna Mires, MD   Assessment & Plan: Visit Diagnoses:  1. Degenerative disc disease, lumbar   2. Spinal stenosis of lumbar region with neurogenic claudication   3. Spondylosis of thoracolumbar region without myelopathy or radiculopathy    Plan:Avoid bending, stooping and avoid lifting weights greater than 10 lbs. Avoid prolong standing and walking. Order for a new walker with wheels. Surgery scheduling secretary Tivis Ringer, will call you in the next week to schedule for surgery.  Surgery recommended is a5level thoracolumbar fusionT10 to L3-S1this would be done with rods, screws andL1-2 and L2-3cages with local bone graft and allograft (donor bone graft). Take hydrocodone for for pain. Risk of surgery includes risk of infection 1 in100 patients, bleeding5% chance you would need a transfusion. Risk to the nerves is one in 10,000. You will need to use a brace for 3 months and wean from the brace on the 4th month. Expect improved walking and standing tolerance. Expect relief of leg pain but numbness may persist depending on the length and degree of pressure that has been present.    Follow-Up Instructions: No follow-ups on file.   Orders:  No orders of the defined types were placed in this encounter.  No orders of the defined types were placed in this encounter.     Procedures: No procedures performed   Clinical Data: No additional findings.   Subjective: Chief Complaint  Patient presents with  . Lower Back - Pain, Follow-up    67 year old female with right hip pain  And difficulty walking. Surgery to extend her fusion to T10 was cancelled due to HGb A1c of 10 she has been working with her primary care And now she report It  is improved to 6.7. Her leg and back pain is worsening to the point where she is not sleeping and is experiencing pain in any position.     Review of Systems  Constitutional: Negative.   HENT: Negative.   Eyes: Negative.   Respiratory: Negative.   Cardiovascular: Negative.   Gastrointestinal: Negative.   Endocrine: Negative.   Genitourinary: Negative.   Musculoskeletal: Negative.   Skin: Negative.   Allergic/Immunologic: Negative.   Neurological: Negative.   Hematological: Negative.   Psychiatric/Behavioral: Negative.      Objective: Vital Signs: BP 139/76 (BP Location: Left Arm, Patient Position: Sitting, Cuff Size: Large)   Pulse 84   Ht 5\' 5"  (1.651 m)   Wt 177 lb (80.3 kg)   BMI 29.45 kg/m   Physical Exam  Constitutional: She is oriented to person, place, and time. She appears well-developed and well-nourished.  HENT:  Head: Normocephalic and atraumatic.  Eyes: Pupils are equal, round, and reactive to light. EOM are normal.  Neck: Normal range of motion. Neck supple.  Pulmonary/Chest: Effort normal and breath sounds normal.  Abdominal: Soft. Bowel sounds are normal.  Neurological: She is alert and oriented to person, place, and time.  Skin: Skin is warm and dry.  Psychiatric: She has a normal mood and affect. Her behavior is normal. Judgment and thought content normal.    Back Exam   Tenderness  The patient is experiencing tenderness in the lumbar.  Range of Motion  Extension: abnormal  Flexion: abnormal  Lateral bend right: abnormal  Lateral bend left: abnormal  Rotation right: abnormal  Rotation left: abnormal   Muscle Strength  Right Quadriceps:  5/5  Left Quadriceps:  5/5  Right Hamstrings:  5/5  Left Hamstrings:  5/5   Tests  Straight leg raise right: negative Straight leg raise left: negative  Reflexes  Patellar: normal Achilles: normal Babinski's sign: normal   Other  Toe walk: normal Heel walk: normal Sensation: normal Erythema: no  back redness Scars: present  Comments:  Hip flexion testing is normal.      Specialty Comments:  No specialty comments available.  Imaging: No results found.   PMFS History: Patient Active Problem List   Diagnosis Date Noted  . Pain in right hip 03/18/2017  . Stiffness of joint, not elsewhere classified, other specified site 08/04/2013  . Leg weakness, bilateral 08/04/2013  . CHEST PAIN UNSPECIFIED 05/10/2009  . BENIGN POSITIONAL VERTIGO 07/30/2007  . VARICOSE VEINS, LOWER EXTREMITIES 07/30/2007  . SCIATICA, CHRONIC 07/30/2007  . DIABETES MELLITUS, TYPE II 11/18/2006  . HYPERLIPIDEMIA 11/18/2006  . MIGRAINE HEADACHE 11/18/2006  . LESION, SCIATIC NERVE 11/18/2006  . HYPERTENSION 11/18/2006  . HYPOTENSION, ORTHOSTATIC 11/18/2006  . BRONCHITIS NOS 11/18/2006  . Other emphysema (HCC) 11/18/2006  . ASTHMA 11/18/2006  . OSTEOARTHRITIS 11/18/2006  . LOW BACK PAIN 11/18/2006  . VERTIGO 11/18/2006   Past Medical History:  Diagnosis Date  . Asthma   . Bronchitis   . Diabetes mellitus   . Hyperlipidemia   . Hypertension   . Low back pain   . Migraine   . Osteoarthritis   . Sciatica   . Varicose veins   . Vertigo     Family History  Problem Relation Age of Onset  . Lung cancer Mother   . Stroke Father   . Heart failure Sister   . Heart failure Sister     Past Surgical History:  Procedure Laterality Date  . ABDOMINAL HYSTERECTOMY    . CARPAL TUNNEL RELEASE    . CESAREAN SECTION    . INGUINAL HERNIA REPAIR    . LUMBAR FUSION     Social History   Occupational History  . Occupation: Disabled    Employer: UNEMPLOYED  Tobacco Use  . Smoking status: Never Smoker  . Smokeless tobacco: Never Used  Substance and Sexual Activity  . Alcohol use: No    Alcohol/week: 0.0 oz  . Drug use: No  . Sexual activity: Not on file

## 2018-01-29 NOTE — Addendum Note (Signed)
Addended by: Vira BrownsNITKA, JAMES on: 01/29/2018 12:54 PM   Modules accepted: Orders

## 2018-02-26 ENCOUNTER — Ambulatory Visit (INDEPENDENT_AMBULATORY_CARE_PROVIDER_SITE_OTHER): Payer: Medicare HMO | Admitting: Surgery

## 2018-02-26 ENCOUNTER — Encounter (INDEPENDENT_AMBULATORY_CARE_PROVIDER_SITE_OTHER): Payer: Self-pay | Admitting: Surgery

## 2018-02-26 VITALS — BP 150/81 | HR 96 | Temp 98.4°F | Resp 20 | Ht 65.0 in | Wt 177.0 lb

## 2018-02-26 DIAGNOSIS — M48062 Spinal stenosis, lumbar region with neurogenic claudication: Secondary | ICD-10-CM | POA: Diagnosis not present

## 2018-02-26 NOTE — H&P (Addendum)
[name] is an 67 y.o. female.   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  Progressively worsening symptoms.  Failed conservative treatment.  Past Medical History:  Diagnosis Date  . Asthma   . Bronchitis   . Diabetes mellitus   . Hyperlipidemia   . Hypertension   . Low back pain   . Migraine   . Osteoarthritis   . Sciatica   . Varicose veins   . Vertigo     Past Surgical History:  Procedure Laterality Date  . ABDOMINAL HYSTERECTOMY    . CARPAL TUNNEL RELEASE    . CESAREAN SECTION    . INGUINAL HERNIA REPAIR    . LUMBAR FUSION      Family History  Problem Relation Age of Onset  . Lung cancer Mother   . Stroke Father   . Heart failure Sister   . Heart failure Sister    Social History:  reports that she has never smoked. She has never used smokeless tobacco. She reports that she does not drink alcohol or use drugs.  Allergies: No Known Allergies  No medications prior to admission.    No results found for this or any previous visit (from the past 48 hour(s)). No results found.  Review of Systems  Constitutional: Negative.   HENT: Negative.   Eyes: Negative.   Respiratory: Negative.   Cardiovascular: Negative.   Gastrointestinal: Negative.   Genitourinary: Negative.   Musculoskeletal: Positive for back pain.  Skin: Negative.   Neurological: Positive for tingling.    There were no vitals taken for this visit. Physical Exam  Constitutional: She is oriented to person, place, and time. She appears well-nourished. No distress.  HENT:  Head: Normocephalic.  Eyes: Pupils are equal, round, and reactive to light. EOM are normal.  Neck: Normal range of motion.  Cardiovascular: Normal rate.  Respiratory: Effort normal and breath sounds normal. No respiratory distress. She has no  wheezes.  GI: Bowel sounds are normal. She exhibits no distension. There is no tenderness.  Musculoskeletal:  Right-sided thoracolumbar paraspinal tenderness.  Positive right straight leg raise  Neurological: She is alert and oriented to person, place, and time.  Skin: Skin is warm and dry.  Psychiatric: She has a normal mood and affect.     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  Surgical procedure along with potential rehab/recovery time discussed.  All questions answered. Zonia Kief, PA-C 02/26/2018, 11:41 AM

## 2018-02-26 NOTE — Progress Notes (Signed)
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  Full H&P was placed in patient's chart.

## 2018-02-28 ENCOUNTER — Encounter (HOSPITAL_COMMUNITY): Payer: Self-pay

## 2018-02-28 ENCOUNTER — Other Ambulatory Visit: Payer: Self-pay

## 2018-02-28 ENCOUNTER — Encounter (HOSPITAL_COMMUNITY)
Admission: RE | Admit: 2018-02-28 | Discharge: 2018-02-28 | Disposition: A | Discharge status: Home / Self Care | Payer: Medicare HMO | Source: Ambulatory Visit | Attending: Surgery | Admitting: Surgery

## 2018-02-28 ENCOUNTER — Encounter (HOSPITAL_COMMUNITY)
Admission: RE | Admit: 2018-02-28 | Discharge: 2018-02-28 | Disposition: A | Discharge status: Home / Self Care | Payer: Medicare HMO | Source: Ambulatory Visit | Attending: Specialist | Admitting: Specialist

## 2018-02-28 DIAGNOSIS — Z01818 Encounter for other preprocedural examination: Secondary | ICD-10-CM | POA: Diagnosis present

## 2018-02-28 DIAGNOSIS — I10 Essential (primary) hypertension: Secondary | ICD-10-CM | POA: Insufficient documentation

## 2018-02-28 DIAGNOSIS — E119 Type 2 diabetes mellitus without complications: Secondary | ICD-10-CM | POA: Diagnosis not present

## 2018-02-28 DIAGNOSIS — J45909 Unspecified asthma, uncomplicated: Secondary | ICD-10-CM | POA: Diagnosis not present

## 2018-02-28 DIAGNOSIS — Z79899 Other long term (current) drug therapy: Secondary | ICD-10-CM | POA: Insufficient documentation

## 2018-02-28 DIAGNOSIS — Z01812 Encounter for preprocedural laboratory examination: Secondary | ICD-10-CM | POA: Insufficient documentation

## 2018-02-28 DIAGNOSIS — E785 Hyperlipidemia, unspecified: Secondary | ICD-10-CM | POA: Diagnosis not present

## 2018-02-28 DIAGNOSIS — R0989 Other specified symptoms and signs involving the circulatory and respiratory systems: Secondary | ICD-10-CM | POA: Diagnosis not present

## 2018-02-28 DIAGNOSIS — Z794 Long term (current) use of insulin: Secondary | ICD-10-CM | POA: Insufficient documentation

## 2018-02-28 LAB — URINALYSIS, ROUTINE W REFLEX MICROSCOPIC
Bilirubin Urine: NEGATIVE
GLUCOSE, UA: NEGATIVE mg/dL
Hgb urine dipstick: NEGATIVE
KETONES UR: NEGATIVE mg/dL
LEUKOCYTES UA: NEGATIVE
NITRITE: NEGATIVE
PROTEIN: NEGATIVE mg/dL
Specific Gravity, Urine: 1.006 (ref 1.005–1.030)
pH: 5 (ref 5.0–8.0)

## 2018-02-28 LAB — SURGICAL PCR SCREEN
MRSA, PCR: NEGATIVE
Staphylococcus aureus: NEGATIVE

## 2018-02-28 LAB — COMPREHENSIVE METABOLIC PANEL
ALK PHOS: 61 U/L (ref 38–126)
ALT: 16 U/L (ref 14–54)
ANION GAP: 10 (ref 5–15)
AST: 26 U/L (ref 15–41)
Albumin: 4.4 g/dL (ref 3.5–5.0)
BUN: 7 mg/dL (ref 6–20)
CALCIUM: 9.8 mg/dL (ref 8.9–10.3)
CO2: 28 mmol/L (ref 22–32)
CREATININE: 0.74 mg/dL (ref 0.44–1.00)
Chloride: 102 mmol/L (ref 101–111)
Glucose, Bld: 99 mg/dL (ref 65–99)
Potassium: 4 mmol/L (ref 3.5–5.1)
Sodium: 140 mmol/L (ref 135–145)
Total Bilirubin: 0.5 mg/dL (ref 0.3–1.2)
Total Protein: 7.3 g/dL (ref 6.5–8.1)

## 2018-02-28 LAB — PROTIME-INR
INR: 1
PROTHROMBIN TIME: 13.1 s (ref 11.4–15.2)

## 2018-02-28 LAB — APTT: aPTT: 29 seconds (ref 24–36)

## 2018-02-28 LAB — HEMOGLOBIN A1C
Hgb A1c MFr Bld: 6.4 % — ABNORMAL HIGH (ref 4.8–5.6)
Mean Plasma Glucose: 136.98 mg/dL

## 2018-02-28 LAB — CBC
HEMATOCRIT: 46.3 % — AB (ref 36.0–46.0)
Hemoglobin: 14.6 g/dL (ref 12.0–15.0)
MCH: 30.1 pg (ref 26.0–34.0)
MCHC: 31.5 g/dL (ref 30.0–36.0)
MCV: 95.5 fL (ref 78.0–100.0)
Platelets: 279 10*3/uL (ref 150–400)
RBC: 4.85 MIL/uL (ref 3.87–5.11)
RDW: 13 % (ref 11.5–15.5)
WBC: 12.3 10*3/uL — ABNORMAL HIGH (ref 4.0–10.5)

## 2018-02-28 LAB — GLUCOSE, CAPILLARY: Glucose-Capillary: 158 mg/dL — ABNORMAL HIGH (ref 65–99)

## 2018-02-28 NOTE — Pre-Procedure Instructions (Signed)
[name]  02/28/2018      [id number] - [place], [place] - [address] [place] [address] Phone: [phone] Fax: [phone]    Your procedure is scheduled on 03/04/2018.  Report to [facility] Admitting at 0530 A.M.  Call this number if you have problems the morning of surgery:  [phone]   Remember:  Do not eat food or drink any liquids after midnight the night before your surgery   Continue all medications as directed by your physician except follow these medication instructions before surgery below    Take these medicines the morning of surgery with A SIP OF WATER: Amlodipine (Norvasc)  7 days prior to surgery STOP taking any Diclofenac (Cataflam), Aspirin (unless otherwise instructed by your surgeon), Aleve, Naproxen, Ibuprofen, Motrin, Advil, Goody's, BC's, all herbal medications, fish oil, and all vitamins  Follow your doctors instructions regarding your Aspirin.  If no instructions were given by your doctor, then you will need to call the prescribing office office to get instructions.  WHAT DO I DO ABOUT MY DIABETES MEDICATION? . DO NOT take your Alogliptin-Metformin the evening before your surgery or the morning of your surgery  . THE NIGHT BEFORE SURGERY, take 45 units of NPH -insulin regular (Novolin 70/30) insulin   . THE MORNING OF SURGERY, take 0 units of NPH-insulin regular (Novolin 70/30) insulin.   How to Manage Your Diabetes Before and After Surgery  Why is it important to control my blood sugar before and after surgery? . Improving blood sugar levels before and after surgery helps healing and can limit problems. . A way of improving blood sugar control is eating a healthy diet by: o  Eating less sugar and carbohydrates o  Increasing activity/exercise o  Talking with your doctor about reaching your blood sugar goals . High blood sugars (greater than 180 mg/dL) can raise your risk of  infections and slow your recovery, so you will need to focus on controlling your diabetes during the weeks before surgery. . Make sure that the doctor who takes care of your diabetes knows about your planned surgery including the date and location.  How do I manage my blood sugar before surgery? . Check your blood sugar at least 4 times a day, starting 2 days before surgery, to make sure that the level is not too high or low. o Check your blood sugar the morning of your surgery when you wake up and every 2 hours until you get to the Short Stay unit. . If your blood sugar is less than 70 mg/dL, you will need to treat for low blood sugar: o Do not take insulin. o Treat a low blood sugar (less than 70 mg/dL) with  cup of clear juice (cranberry or apple), 4 glucose tablets, OR glucose gel. Recheck blood sugar in 15 minutes after treatment (to make sure it is greater than 70 mg/dL). If your blood sugar is not greater than 70 mg/dL on recheck, call [phone] o  for further instructions. . Report your blood sugar to the short stay nurse when you get to Short Stay.  . If you are admitted to the hospital after surgery: o Your blood sugar will be checked by the staff and you will probably be given insulin after surgery (instead of oral diabetes medicines) to make sure you have good blood sugar levels. o The goal for blood sugar control after surgery is 80-180 mg/dL.  Do not wear jewelry, make-up or nail polish.  Do not wear lotions, powders, or perfumes, or deodorant.  Do not shave 48 hours prior to surgery.  Men may shave face and neck.  Do not bring valuables to the hospital.  [facility] is not responsible for any belongings or valuables.  Hearing aids, eyeglasses, contacts, dentures or bridgework may not be worn into surgery.  Leave your suitcase in the car.  After surgery it may be brought to your room.  For patients admitted to the hospital, discharge time will be determined by  your treatment team.  Patients discharged the day of surgery will not be allowed to drive home.   Name and phone number of your driver:    Special instructions:   [place]- Preparing For Surgery  Before surgery, you can play an important role. Because skin is not sterile, your skin needs to be as free of germs as possible. You can reduce the number of germs on your skin by washing with CHG (chlorahexidine gluconate) Soap before surgery.  CHG is an antiseptic cleaner which kills germs and bonds with the skin to continue killing germs even after washing.  Oral Hygiene is also important to reduce your risk of infection.  Remember - BRUSH YOUR TEETH THE MORNING OF SURGERY  Please do not use if you have an allergy to CHG or antibacterial soaps. If your skin becomes reddened/irritated stop using the CHG.  Do not shave (including legs and underarms) for at least 48 hours prior to first CHG shower. It is OK to shave your face.  Please follow these instructions carefully.   1. Shower the NIGHT BEFORE SURGERY and the MORNING OF SURGERY with CHG.   2. If you chose to wash your hair, wash your hair first as usual with your normal shampoo.  3. After you shampoo, rinse your hair and body thoroughly to remove the shampoo.  4. Use CHG as you would any other liquid soap. You can apply CHG directly to the skin and wash gently with a scrungie or a clean washcloth.   5. Apply the CHG Soap to your body ONLY FROM THE NECK DOWN.  Do not use on open wounds or open sores. Avoid contact with your eyes, ears, mouth and genitals (private parts). Wash Face and genitals (private parts)  with your normal soap.  6. Wash thoroughly, paying special attention to the area where your surgery will be performed.  7. Thoroughly rinse your body with warm water from the neck down.  8. DO NOT shower/wash with your normal soap after using and rinsing off the CHG Soap.  9. Pat yourself dry with a CLEAN TOWEL.  10. Wear CLEAN  PAJAMAS to bed the night before surgery, wear comfortable clothes the morning of surgery  11. Place CLEAN SHEETS on your bed the night of your first shower and DO NOT SLEEP WITH PETS.    Day of Surgery: Shower as stated above. Do not apply any deodorants/lotions.  Please wear clean clothes to the hospital/surgery center.   Remember to brush your teeth.      Please read over the following fact sheets that you were given.

## 2018-02-28 NOTE — Progress Notes (Signed)
PCP - Dr. Annia Friendly. [place] Cardiologist - Dr. Purvis Sheffield  Chest x-ray - 02/28/2018 EKG - 08/09/2017 Stress Test - 08/16/2017 ECHO - 05/17/2009 Cardiac Cath - patient denies  Sleep Study - patient denies    Fasting Blood Sugar - 90's Checks Blood Sugar 3 times a day  Aspirin Instructions: LD 02/27/2018  Anesthesia review: yes, cardiac history, abnormal EKG  Patient denies shortness of breath, fever, cough and chest pain at PAT appointment   Patient verbalized understanding of instructions that were given to them at the PAT appointment. Patient was also instructed that they will need to review over the PAT instructions again at home before surgery.

## 2018-03-03 NOTE — Progress Notes (Signed)
Anesthesia Chart Review:  Case:  [id number] Date/Time:  03/04/18 0715   Procedure:  Right transforaminal lumbar interbody fusion L1-2, L2-3 Posterior fusion T10, T11, T12, L1, L2 with T 11, T12 pedicle screws, superior sublaminar hooks T10, local bone graft, allograft cancellous chips Vivigen (N/A )   Anesthesia type:  General   Pre-op diagnosis:  proximal junctional kyphosis and spinal stenosis L2-3, Degenerative Disc Disease retrolisthesis L1-2, Diffuse spondylosis thoracic spine   Location:  [facility] OR ROOM 06 / [facility] OR   Surgeon:  Kerrin Champagne, MD      DISCUSSION: Patient is a 67 year old female scheduled for the above procedure. Case was initially scheduled for 09/17/17, but was postponed due to poorly controlled diabetes. Since May, patient's A1c has dropped from 10.2 to 6.4.   History includes never smoker, asthma, DM2, HLD, HTN, lumbar fusion. Her DM is well controlled now. She had recent low risk stress test. If no acute changes then I anticipate that she can proceed as planned.   VS: BP (!) 155/68   Pulse 94   Temp 36.8 C   Resp 20   Ht  (1.651 m)   Wt 180 lb 9.6 oz (81.9 kg)   SpO2 98%   BMI 30.05 kg/m   PROVIDERS: - PCP is Dr. Mirna Mires who has signed clearance letter for surgery (scanned under Media tab). - Cardiologist is Dr. Prentice Docker who saw patient for pre-operative evaluation on 08/09/17 due to an abnormal EKG. She had a low risk stress test.    LABS: Labs reviewed: Acceptable for surgery. (all labs ordered are listed, but only abnormal results are displayed)  Labs Reviewed  GLUCOSE, CAPILLARY - Abnormal; Notable for the following components:      Result Value   Glucose-Capillary 158 (*)    All other components within normal limits  CBC - Abnormal; Notable for the following components:   WBC 12.3 (*)    HCT 46.3 (*)    All other components within normal limits  HEMOGLOBIN A1C - Abnormal; Notable for the following components:   Hgb A1c MFr Bld 6.4  (*)    All other components within normal limits  SURGICAL PCR SCREEN  APTT  COMPREHENSIVE METABOLIC PANEL  PROTIME-INR  URINALYSIS, ROUTINE W REFLEX MICROSCOPIC  TYPE AND SCREEN    IMAGES: CXR 02/28/18: IMPRESSION: No acute cardiopulmonary disease.   EKG: 08/09/17: Sinus Rhythm. Poor R-wave progression, nonspecific, consider old anterior infarct. T-abnormality, consider lateral ischemia.    CV:  Nuclear stress test 08/16/17:   There was no ST segment deviation noted during stress.  This is a low risk study.  The left ventricular ejection fraction is normal (55-65%).  Small mild intensity reversible anterior defect. May represent mild ischemia vs differences in breast attenuation. Either findings supports low risk.  Echo 05/17/09: Study Conclusions 1. Left ventricle: The cavity size was normal. Wall thickness was  increased in a pattern of mild LVH. Systolic function was normal.  The estimated ejection fraction was in the range of 55% to 60%.  Wall motion was normal; there were no regional wall motion  abnormalities. Doppler parameters are consistent with abnormal  left ventricular relaxation (grade 1 diastolic dysfunction). 2. Aortic valve: Mildly calcified annulus. Trileaflet. 3. Left atrium: The atrium was mildly dilated. 4. Right ventricle: The cavity size was mildly dilated. Systolic  function was normal. 5. Atrial septum: There was redundancy of the septum, with  borderline criteria for aneurysm. No definite shunt. 6. Tricuspid  valve: Trivial regurgitation.  Past Medical History:  Diagnosis Date  . Asthma   . Bronchitis   . Diabetes mellitus   . Hyperlipidemia   . Hypertension   . Low back pain   . Migraine   . Osteoarthritis   . Sciatica   . Varicose veins   . Vertigo     Past Surgical History:  Procedure Laterality Date  . ABDOMINAL HYSTERECTOMY    . CARPAL TUNNEL RELEASE    . CESAREAN SECTION    . INGUINAL HERNIA  REPAIR    . LUMBAR FUSION      MEDICATIONS: . methocarbamol (ROBAXIN) 500 MG tablet  . Alogliptin-metFORMIN HCl 12.02-999 MG TABS  . amLODipine (NORVASC) 5 MG tablet  . aspirin EC 81 MG tablet  . diclofenac (CATAFLAM) 50 MG tablet  . furosemide (LASIX) 20 MG tablet  . insulin NPH-insulin regular (NOVOLIN 70/30) (70-30) 100 UNIT/ML injection  . losartan (COZAAR) 50 MG tablet  . potassium chloride (K-DUR) 10 MEQ tablet  . simvastatin (ZOCOR) 20 MG tablet  . traMADol (ULTRAM) 50 MG tablet  . zolpidem (AMBIEN CR) 6.25 MG CR tablet   No current facility-administered medications for this encounter.    ASA on hold since 02/27/18.  Velna Ochs [facility] Short Stay Center/Anesthesiology Phone [phone] 03/03/2018 9:49 AM

## 2018-03-03 NOTE — Progress Notes (Signed)
Patient is positive for antibodies.  Will repeat DOS.  Dr. Barbaraann Faster office notified.

## 2018-03-04 ENCOUNTER — Encounter (HOSPITAL_COMMUNITY)
Admission: RE | Disposition: A | Discharge status: Skilled Nursing Facility | Payer: Self-pay | Source: Ambulatory Visit | Attending: Specialist

## 2018-03-04 ENCOUNTER — Encounter (HOSPITAL_COMMUNITY): Payer: Self-pay | Admitting: *Deleted

## 2018-03-04 ENCOUNTER — Inpatient Hospital Stay (HOSPITAL_COMMUNITY): Payer: MEDICARE

## 2018-03-04 ENCOUNTER — Inpatient Hospital Stay (HOSPITAL_COMMUNITY)
Admission: RE | Admit: 2018-03-04 | Discharge: 2018-03-08 | DRG: 454 | Disposition: A | Discharge status: Skilled Nursing Facility | Payer: MEDICARE | Source: Ambulatory Visit | Attending: Specialist | Admitting: Specialist

## 2018-03-04 ENCOUNTER — Inpatient Hospital Stay (HOSPITAL_COMMUNITY): Payer: MEDICARE | Admitting: Vascular Surgery

## 2018-03-04 DIAGNOSIS — M5136 Other intervertebral disc degeneration, lumbar region: Secondary | ICD-10-CM | POA: Diagnosis not present

## 2018-03-04 DIAGNOSIS — Z801 Family history of malignant neoplasm of trachea, bronchus and lung: Secondary | ICD-10-CM | POA: Diagnosis not present

## 2018-03-04 DIAGNOSIS — R262 Difficulty in walking, not elsewhere classified: Secondary | ICD-10-CM | POA: Diagnosis not present

## 2018-03-04 DIAGNOSIS — D62 Acute posthemorrhagic anemia: Secondary | ICD-10-CM | POA: Diagnosis not present

## 2018-03-04 DIAGNOSIS — M4325 Fusion of spine, thoracolumbar region: Secondary | ICD-10-CM | POA: Diagnosis not present

## 2018-03-04 DIAGNOSIS — M47894 Other spondylosis, thoracic region: Secondary | ICD-10-CM | POA: Diagnosis not present

## 2018-03-04 DIAGNOSIS — M47814 Spondylosis without myelopathy or radiculopathy, thoracic region: Secondary | ICD-10-CM | POA: Diagnosis not present

## 2018-03-04 DIAGNOSIS — I10 Essential (primary) hypertension: Secondary | ICD-10-CM | POA: Diagnosis not present

## 2018-03-04 DIAGNOSIS — M40209 Unspecified kyphosis, site unspecified: Secondary | ICD-10-CM | POA: Diagnosis present

## 2018-03-04 DIAGNOSIS — Z823 Family history of stroke: Secondary | ICD-10-CM | POA: Diagnosis not present

## 2018-03-04 DIAGNOSIS — M4804 Spinal stenosis, thoracic region: Secondary | ICD-10-CM | POA: Diagnosis not present

## 2018-03-04 DIAGNOSIS — M5134 Other intervertebral disc degeneration, thoracic region: Secondary | ICD-10-CM | POA: Diagnosis present

## 2018-03-04 DIAGNOSIS — M4814 Ankylosing hyperostosis [Forestier], thoracic region: Secondary | ICD-10-CM | POA: Diagnosis not present

## 2018-03-04 DIAGNOSIS — Z981 Arthrodesis status: Secondary | ICD-10-CM

## 2018-03-04 DIAGNOSIS — M4815 Ankylosing hyperostosis [Forestier], thoracolumbar region: Secondary | ICD-10-CM

## 2018-03-04 DIAGNOSIS — Z90710 Acquired absence of both cervix and uterus: Secondary | ICD-10-CM

## 2018-03-04 DIAGNOSIS — I8390 Asymptomatic varicose veins of unspecified lower extremity: Secondary | ICD-10-CM | POA: Diagnosis present

## 2018-03-04 DIAGNOSIS — Z8249 Family history of ischemic heart disease and other diseases of the circulatory system: Secondary | ICD-10-CM

## 2018-03-04 DIAGNOSIS — R5082 Postprocedural fever: Secondary | ICD-10-CM | POA: Diagnosis not present

## 2018-03-04 DIAGNOSIS — H8149 Vertigo of central origin, unspecified ear: Secondary | ICD-10-CM | POA: Diagnosis not present

## 2018-03-04 DIAGNOSIS — M48062 Spinal stenosis, lumbar region with neurogenic claudication: Principal | ICD-10-CM

## 2018-03-04 DIAGNOSIS — M48061 Spinal stenosis, lumbar region without neurogenic claudication: Secondary | ICD-10-CM | POA: Diagnosis not present

## 2018-03-04 DIAGNOSIS — R339 Retention of urine, unspecified: Secondary | ICD-10-CM | POA: Diagnosis not present

## 2018-03-04 DIAGNOSIS — J449 Chronic obstructive pulmonary disease, unspecified: Secondary | ICD-10-CM | POA: Diagnosis present

## 2018-03-04 DIAGNOSIS — Z419 Encounter for procedure for purposes other than remedying health state, unspecified: Secondary | ICD-10-CM

## 2018-03-04 DIAGNOSIS — I9581 Postprocedural hypotension: Secondary | ICD-10-CM | POA: Diagnosis not present

## 2018-03-04 DIAGNOSIS — M353 Polymyalgia rheumatica: Secondary | ICD-10-CM | POA: Diagnosis not present

## 2018-03-04 DIAGNOSIS — Z743 Need for continuous supervision: Secondary | ICD-10-CM | POA: Diagnosis not present

## 2018-03-04 DIAGNOSIS — M5430 Sciatica, unspecified side: Secondary | ICD-10-CM | POA: Diagnosis not present

## 2018-03-04 DIAGNOSIS — R279 Unspecified lack of coordination: Secondary | ICD-10-CM | POA: Diagnosis not present

## 2018-03-04 DIAGNOSIS — M158 Other polyosteoarthritis: Secondary | ICD-10-CM | POA: Diagnosis not present

## 2018-03-04 DIAGNOSIS — R42 Dizziness and giddiness: Secondary | ICD-10-CM | POA: Diagnosis not present

## 2018-03-04 DIAGNOSIS — M5116 Intervertebral disc disorders with radiculopathy, lumbar region: Secondary | ICD-10-CM | POA: Diagnosis not present

## 2018-03-04 DIAGNOSIS — E785 Hyperlipidemia, unspecified: Secondary | ICD-10-CM | POA: Diagnosis present

## 2018-03-04 DIAGNOSIS — E119 Type 2 diabetes mellitus without complications: Secondary | ICD-10-CM | POA: Diagnosis present

## 2018-03-04 DIAGNOSIS — Z794 Long term (current) use of insulin: Secondary | ICD-10-CM

## 2018-03-04 DIAGNOSIS — M1990 Unspecified osteoarthritis, unspecified site: Secondary | ICD-10-CM | POA: Diagnosis present

## 2018-03-04 DIAGNOSIS — M40294 Other kyphosis, thoracic region: Secondary | ICD-10-CM | POA: Diagnosis not present

## 2018-03-04 DIAGNOSIS — M539 Dorsopathy, unspecified: Secondary | ICD-10-CM | POA: Diagnosis not present

## 2018-03-04 DIAGNOSIS — J9811 Atelectasis: Secondary | ICD-10-CM | POA: Diagnosis not present

## 2018-03-04 DIAGNOSIS — M545 Low back pain: Secondary | ICD-10-CM | POA: Diagnosis not present

## 2018-03-04 DIAGNOSIS — J45998 Other asthma: Secondary | ICD-10-CM | POA: Diagnosis not present

## 2018-03-04 DIAGNOSIS — M40299 Other kyphosis, site unspecified: Secondary | ICD-10-CM | POA: Diagnosis not present

## 2018-03-04 HISTORY — PX: POSTERIOR LUMBAR FUSION 4 LEVEL: SHX6037

## 2018-03-04 LAB — HEMOGLOBIN A1C
HEMOGLOBIN A1C: 6.4 % — AB (ref 4.8–5.6)
Mean Plasma Glucose: 136.98 mg/dL

## 2018-03-04 LAB — POCT I-STAT 4, (NA,K, GLUC, HGB,HCT)
GLUCOSE: 219 mg/dL — AB (ref 65–99)
Glucose, Bld: 176 mg/dL — ABNORMAL HIGH (ref 65–99)
Glucose, Bld: 230 mg/dL — ABNORMAL HIGH (ref 65–99)
Glucose, Bld: 220 mg/dL — ABNORMAL HIGH (ref 65–99)
HCT: 34 % — ABNORMAL LOW (ref 36.0–46.0)
HEMATOCRIT: 35 % — AB (ref 36.0–46.0)
HEMATOCRIT: 34 % — AB (ref 36.0–46.0)
HEMATOCRIT: 32 % — AB (ref 36.0–46.0)
HEMOGLOBIN: 11.9 g/dL — AB (ref 12.0–15.0)
HEMOGLOBIN: 11.6 g/dL — AB (ref 12.0–15.0)
HEMOGLOBIN: 10.9 g/dL — AB (ref 12.0–15.0)
Hemoglobin: 11.6 g/dL — ABNORMAL LOW (ref 12.0–15.0)
POTASSIUM: 3.8 mmol/L (ref 3.5–5.1)
POTASSIUM: 3.8 mmol/L (ref 3.5–5.1)
Potassium: 4.1 mmol/L (ref 3.5–5.1)
Potassium: 4.3 mmol/L (ref 3.5–5.1)
SODIUM: 138 mmol/L (ref 135–145)
Sodium: 138 mmol/L (ref 135–145)
Sodium: 138 mmol/L (ref 135–145)
Sodium: 139 mmol/L (ref 135–145)

## 2018-03-04 LAB — GLUCOSE, CAPILLARY
GLUCOSE-CAPILLARY: 113 mg/dL — AB (ref 65–99)
GLUCOSE-CAPILLARY: 195 mg/dL — AB (ref 65–99)
Glucose-Capillary: 178 mg/dL — ABNORMAL HIGH (ref 65–99)
Glucose-Capillary: 191 mg/dL — ABNORMAL HIGH (ref 65–99)
Glucose-Capillary: 219 mg/dL — ABNORMAL HIGH (ref 65–99)

## 2018-03-04 SURGERY — POSTERIOR LUMBAR FUSION 4 LEVEL
Anesthesia: General | Site: Spine Lumbar

## 2018-03-04 MED ORDER — BISACODYL 5 MG PO TBEC: 5.0000 mg | DELAYED_RELEASE_TABLET | Freq: Every day | ORAL | Status: DC | PRN

## 2018-03-04 MED ORDER — FENTANYL CITRATE (PF) 250 MCG/5ML IJ SOLN
INTRAMUSCULAR | Status: AC
  Filled 2018-03-04: qty 5

## 2018-03-04 MED ORDER — GABAPENTIN 300 MG PO CAPS
300.0000 mg | ORAL_CAPSULE | Freq: Three times a day (TID) | ORAL | Status: DC
  Administered 2018-03-04 – 2018-03-08 (×12): 300 mg via ORAL
  Filled 2018-03-04 (×12): qty 1

## 2018-03-04 MED ORDER — ALUM & MAG HYDROXIDE-SIMETH 200-200-20 MG/5ML PO SUSP: 30.0000 mL | Freq: Four times a day (QID) | ORAL | Status: DC | PRN

## 2018-03-04 MED ORDER — SODIUM CHLORIDE 0.9 % IV SOLN: 250.0000 mL | INTRAVENOUS | Status: DC

## 2018-03-04 MED ORDER — SODIUM CHLORIDE 0.9 % IV SOLN
INTRAVENOUS | Status: DC | PRN
  Administered 2018-03-04: 12:00:00 via INTRAVENOUS

## 2018-03-04 MED ORDER — 0.9 % SODIUM CHLORIDE (POUR BTL) OPTIME
TOPICAL | Status: DC | PRN
  Administered 2018-03-04 (×2): 1000 mL

## 2018-03-04 MED ORDER — SURGIFOAM 100 EX MISC
CUTANEOUS | Status: DC | PRN
  Administered 2018-03-04: 1 via TOPICAL

## 2018-03-04 MED ORDER — POLYETHYLENE GLYCOL 3350 17 G PO PACK
17.0000 g | PACK | Freq: Every day | ORAL | Status: DC | PRN
  Administered 2018-03-07 – 2018-03-08 (×3): 17 g via ORAL
  Filled 2018-03-04 (×2): qty 1

## 2018-03-04 MED ORDER — SODIUM CHLORIDE 0.9 % IV SOLN: INTRAVENOUS | Status: DC | PRN

## 2018-03-04 MED ORDER — PHENYLEPHRINE 40 MCG/ML (10ML) SYRINGE FOR IV PUSH (FOR BLOOD PRESSURE SUPPORT)
PREFILLED_SYRINGE | INTRAVENOUS | Status: AC
  Filled 2018-03-04: qty 10

## 2018-03-04 MED ORDER — TRANEXAMIC ACID 1000 MG/10ML IV SOLN
1000.0000 mg | INTRAVENOUS | Status: AC
  Administered 2018-03-04: 1000 mg via INTRAVENOUS
  Filled 2018-03-04: qty 1100

## 2018-03-04 MED ORDER — ZOLPIDEM TARTRATE 5 MG PO TABS: 5.0000 mg | ORAL_TABLET | Freq: Every evening | ORAL | Status: DC | PRN

## 2018-03-04 MED ORDER — THROMBIN (RECOMBINANT) 20000 UNITS EX SOLR
CUTANEOUS | Status: AC
  Filled 2018-03-04: qty 20000

## 2018-03-04 MED ORDER — VANCOMYCIN HCL 1000 MG IV SOLR
INTRAVENOUS | Status: DC | PRN
  Administered 2018-03-04: 1000 mg via TOPICAL

## 2018-03-04 MED ORDER — CEFAZOLIN SODIUM-DEXTROSE 2-4 GM/100ML-% IV SOLN
2.0000 g | Freq: Three times a day (TID) | INTRAVENOUS | Status: AC
  Administered 2018-03-04 – 2018-03-05 (×2): 2 g via INTRAVENOUS
  Filled 2018-03-04 (×2): qty 100

## 2018-03-04 MED ORDER — BUPIVACAINE HCL (PF) 0.5 % IJ SOLN
INTRAMUSCULAR | Status: AC
  Filled 2018-03-04: qty 30

## 2018-03-04 MED ORDER — METHOCARBAMOL 1000 MG/10ML IJ SOLN: 500.0000 mg | Freq: Four times a day (QID) | INTRAMUSCULAR | Status: DC | PRN

## 2018-03-04 MED ORDER — EPHEDRINE SULFATE 50 MG/ML IJ SOLN
INTRAMUSCULAR | Status: DC | PRN
  Administered 2018-03-04: 10 mg via INTRAVENOUS
  Administered 2018-03-04: 5 mg via INTRAVENOUS
  Administered 2018-03-04: 10 mg via INTRAVENOUS

## 2018-03-04 MED ORDER — MIDAZOLAM HCL 2 MG/2ML IJ SOLN
INTRAMUSCULAR | Status: AC
  Filled 2018-03-04: qty 2

## 2018-03-04 MED ORDER — ONDANSETRON HCL 4 MG/2ML IJ SOLN
INTRAMUSCULAR | Status: AC
  Filled 2018-03-04: qty 2

## 2018-03-04 MED ORDER — SUCCINYLCHOLINE CHLORIDE 200 MG/10ML IV SOSY
PREFILLED_SYRINGE | INTRAVENOUS | Status: AC
  Filled 2018-03-04: qty 10

## 2018-03-04 MED ORDER — METFORMIN HCL 500 MG PO TABS
1000.0000 mg | ORAL_TABLET | Freq: Two times a day (BID) | ORAL | Status: DC
  Administered 2018-03-05: 1000 mg via ORAL
  Filled 2018-03-04 (×2): qty 2

## 2018-03-04 MED ORDER — FLEET ENEMA 7-19 GM/118ML RE ENEM
1.0000 | ENEMA | Freq: Once | RECTAL | Status: AC | PRN
  Administered 2018-03-08: 1 via RECTAL
  Filled 2018-03-04: qty 1

## 2018-03-04 MED ORDER — ALOGLIPTIN-METFORMIN HCL 12.5-1000 MG PO TABS: 1.0000 | ORAL_TABLET | Freq: Two times a day (BID) | ORAL | Status: DC

## 2018-03-04 MED ORDER — PROPOFOL 10 MG/ML IV BOLUS
INTRAVENOUS | Status: DC | PRN
  Administered 2018-03-04: 20 mg via INTRAVENOUS
  Administered 2018-03-04: 30 mg via INTRAVENOUS
  Administered 2018-03-04: 20 mg via INTRAVENOUS
  Administered 2018-03-04: 100 mg via INTRAVENOUS

## 2018-03-04 MED ORDER — METHOCARBAMOL 500 MG PO TABS
500.0000 mg | ORAL_TABLET | Freq: Four times a day (QID) | ORAL | Status: DC | PRN
  Filled 2018-03-04 (×3): qty 1

## 2018-03-04 MED ORDER — ONDANSETRON HCL 4 MG/2ML IJ SOLN
4.0000 mg | Freq: Four times a day (QID) | INTRAMUSCULAR | Status: DC | PRN
Start: 2018-03-04 — End: 2018-03-08

## 2018-03-04 MED ORDER — FENTANYL CITRATE (PF) 100 MCG/2ML IJ SOLN
INTRAMUSCULAR | Status: DC | PRN
  Administered 2018-03-04: 50 ug via INTRAVENOUS
  Administered 2018-03-04: 25 ug via INTRAVENOUS
  Administered 2018-03-04 (×2): 50 ug via INTRAVENOUS
  Administered 2018-03-04 (×2): 25 ug via INTRAVENOUS
  Administered 2018-03-04: 50 ug via INTRAVENOUS
  Administered 2018-03-04 (×6): 25 ug via INTRAVENOUS
  Administered 2018-03-04: 50 ug via INTRAVENOUS
  Administered 2018-03-04 (×2): 75 ug via INTRAVENOUS
  Administered 2018-03-04: 50 ug via INTRAVENOUS

## 2018-03-04 MED ORDER — HYDROMORPHONE HCL 2 MG/ML IJ SOLN
0.2500 mg | INTRAMUSCULAR | Status: DC | PRN
  Administered 2018-03-04: 0.5 mg via INTRAVENOUS

## 2018-03-04 MED ORDER — ARTIFICIAL TEARS OPHTHALMIC OINT
TOPICAL_OINTMENT | OPHTHALMIC | Status: DC | PRN
  Administered 2018-03-04: 1 via OPHTHALMIC

## 2018-03-04 MED ORDER — PHENOL 1.4 % MT LIQD: 1.0000 | OROMUCOSAL | Status: DC | PRN

## 2018-03-04 MED ORDER — ARTIFICIAL TEARS OPHTHALMIC OINT
TOPICAL_OINTMENT | OPHTHALMIC | Status: AC
  Filled 2018-03-04: qty 3.5

## 2018-03-04 MED ORDER — ONDANSETRON HCL 4 MG PO TABS: 4.0000 mg | ORAL_TABLET | Freq: Four times a day (QID) | ORAL | Status: DC | PRN

## 2018-03-04 MED ORDER — OXYCODONE HCL 5 MG PO TABS
ORAL_TABLET | ORAL | Status: AC
  Filled 2018-03-04: qty 1

## 2018-03-04 MED ORDER — DEXAMETHASONE SODIUM PHOSPHATE 10 MG/ML IJ SOLN
INTRAMUSCULAR | Status: AC
  Filled 2018-03-04: qty 1

## 2018-03-04 MED ORDER — CEFAZOLIN SODIUM-DEXTROSE 2-4 GM/100ML-% IV SOLN
2.0000 g | INTRAVENOUS | Status: AC
  Administered 2018-03-04 (×2): 2 g via INTRAVENOUS

## 2018-03-04 MED ORDER — THROMBIN (RECOMBINANT) 20000 UNITS EX SOLR
CUTANEOUS | Status: DC | PRN
  Administered 2018-03-04: 20000 [IU] via TOPICAL

## 2018-03-04 MED ORDER — BUPIVACAINE LIPOSOME 1.3 % IJ SUSP
INTRAMUSCULAR | Status: DC | PRN
  Administered 2018-03-04: 10 mL

## 2018-03-04 MED ORDER — ONDANSETRON HCL 4 MG/2ML IJ SOLN
INTRAMUSCULAR | Status: DC | PRN
  Administered 2018-03-04: 4 mg via INTRAVENOUS

## 2018-03-04 MED ORDER — LACTATED RINGERS IV SOLN
INTRAVENOUS | Status: DC | PRN
  Administered 2018-03-04 (×3): via INTRAVENOUS

## 2018-03-04 MED ORDER — HYDROMORPHONE HCL 2 MG/ML IJ SOLN: 0.2500 mg | INTRAMUSCULAR | Status: DC | PRN

## 2018-03-04 MED ORDER — METHOCARBAMOL 500 MG PO TABS
500.0000 mg | ORAL_TABLET | Freq: Four times a day (QID) | ORAL | Status: DC | PRN
  Administered 2018-03-04 – 2018-03-07 (×5): 500 mg via ORAL
  Filled 2018-03-04: qty 1

## 2018-03-04 MED ORDER — BUPIVACAINE LIPOSOME 1.3 % IJ SUSP
20.0000 mL | INTRAMUSCULAR | Status: DC
  Filled 2018-03-04: qty 20

## 2018-03-04 MED ORDER — KETOROLAC TROMETHAMINE 15 MG/ML IJ SOLN
7.5000 mg | Freq: Four times a day (QID) | INTRAMUSCULAR | Status: AC
  Administered 2018-03-04 – 2018-03-05 (×4): 7.5 mg via INTRAVENOUS
  Filled 2018-03-04 (×4): qty 1

## 2018-03-04 MED ORDER — LACTATED RINGERS IV SOLN
INTRAVENOUS | Status: DC | PRN
  Administered 2018-03-04 (×2): via INTRAVENOUS

## 2018-03-04 MED ORDER — SODIUM CHLORIDE 0.9 % IV SOLN
INTRAVENOUS | Status: DC
  Administered 2018-03-05 – 2018-03-07 (×2): via INTRAVENOUS

## 2018-03-04 MED ORDER — PROPOFOL 10 MG/ML IV BOLUS
INTRAVENOUS | Status: AC
  Filled 2018-03-04: qty 20

## 2018-03-04 MED ORDER — OXYCODONE HCL 5 MG PO TABS
10.0000 mg | ORAL_TABLET | ORAL | Status: DC | PRN
  Administered 2018-03-06 – 2018-03-07 (×2): 10 mg via ORAL
  Filled 2018-03-04 (×2): qty 2

## 2018-03-04 MED ORDER — INSULIN ASPART 100 UNIT/ML ~~LOC~~ SOLN
0.0000 [IU] | Freq: Three times a day (TID) | SUBCUTANEOUS | Status: DC
  Administered 2018-03-04 – 2018-03-05 (×3): 4 [IU] via SUBCUTANEOUS
  Administered 2018-03-05: 3 [IU] via SUBCUTANEOUS
  Administered 2018-03-06: 4 [IU] via SUBCUTANEOUS
  Administered 2018-03-06 (×2): 3 [IU] via SUBCUTANEOUS
  Administered 2018-03-07: 4 [IU] via SUBCUTANEOUS
  Administered 2018-03-07: 3 [IU] via SUBCUTANEOUS
  Administered 2018-03-07: 4 [IU] via SUBCUTANEOUS
  Administered 2018-03-08: 3 [IU] via SUBCUTANEOUS
  Administered 2018-03-08: 4 [IU] via SUBCUTANEOUS

## 2018-03-04 MED ORDER — FUROSEMIDE 20 MG PO TABS: 20.0000 mg | ORAL_TABLET | Freq: Every day | ORAL | Status: DC | PRN

## 2018-03-04 MED ORDER — METHOCARBAMOL 500 MG PO TABS
ORAL_TABLET | ORAL | Status: AC
  Filled 2018-03-04: qty 1

## 2018-03-04 MED ORDER — MORPHINE SULFATE (PF) 4 MG/ML IV SOLN: 1.0000 mg | INTRAVENOUS | Status: DC | PRN

## 2018-03-04 MED ORDER — ROCURONIUM BROMIDE 100 MG/10ML IV SOLN
INTRAVENOUS | Status: DC | PRN
  Administered 2018-03-04: 10 mg via INTRAVENOUS
  Administered 2018-03-04: 20 mg via INTRAVENOUS

## 2018-03-04 MED ORDER — ACETAMINOPHEN 650 MG RE SUPP: 650.0000 mg | RECTAL | Status: DC | PRN

## 2018-03-04 MED ORDER — DOCUSATE SODIUM 100 MG PO CAPS
100.0000 mg | ORAL_CAPSULE | Freq: Two times a day (BID) | ORAL | Status: DC
  Administered 2018-03-04 – 2018-03-08 (×8): 100 mg via ORAL
  Filled 2018-03-04 (×9): qty 1

## 2018-03-04 MED ORDER — POTASSIUM CHLORIDE ER 10 MEQ PO TBCR: 10.0000 meq | EXTENDED_RELEASE_TABLET | Freq: Every day | ORAL | Status: DC | PRN

## 2018-03-04 MED ORDER — OXYCODONE HCL ER 15 MG PO T12A
15.0000 mg | EXTENDED_RELEASE_TABLET | Freq: Two times a day (BID) | ORAL | Status: DC
  Administered 2018-03-04 – 2018-03-08 (×8): 15 mg via ORAL
  Filled 2018-03-04 (×8): qty 1

## 2018-03-04 MED ORDER — OXYCODONE HCL 5 MG PO TABS
5.0000 mg | ORAL_TABLET | ORAL | Status: DC | PRN
  Administered 2018-03-04 – 2018-03-07 (×3): 5 mg via ORAL
  Filled 2018-03-04 (×2): qty 1

## 2018-03-04 MED ORDER — PHENYLEPHRINE 40 MCG/ML (10ML) SYRINGE FOR IV PUSH (FOR BLOOD PRESSURE SUPPORT)
PREFILLED_SYRINGE | INTRAVENOUS | Status: DC | PRN
  Administered 2018-03-04 (×3): 80 ug via INTRAVENOUS
  Administered 2018-03-04: 120 ug via INTRAVENOUS
  Administered 2018-03-04: 40 ug via INTRAVENOUS

## 2018-03-04 MED ORDER — LOSARTAN POTASSIUM 50 MG PO TABS
50.0000 mg | ORAL_TABLET | Freq: Every day | ORAL | Status: DC
  Filled 2018-03-04 (×2): qty 1

## 2018-03-04 MED ORDER — SIMVASTATIN 20 MG PO TABS
20.0000 mg | ORAL_TABLET | Freq: Every day | ORAL | Status: DC
  Administered 2018-03-04 – 2018-03-07 (×4): 20 mg via ORAL
  Filled 2018-03-04 (×4): qty 1

## 2018-03-04 MED ORDER — VANCOMYCIN HCL 1000 MG IV SOLR
INTRAVENOUS | Status: AC
  Filled 2018-03-04: qty 1000

## 2018-03-04 MED ORDER — CEFAZOLIN SODIUM 1 G IJ SOLR
INTRAMUSCULAR | Status: AC
  Filled 2018-03-04: qty 20

## 2018-03-04 MED ORDER — SODIUM CHLORIDE 0.9% FLUSH
3.0000 mL | Freq: Two times a day (BID) | INTRAVENOUS | Status: DC
  Administered 2018-03-05 – 2018-03-06 (×3): 3 mL via INTRAVENOUS

## 2018-03-04 MED ORDER — ASPIRIN EC 81 MG PO TBEC
81.0000 mg | DELAYED_RELEASE_TABLET | Freq: Every day | ORAL | Status: DC
  Administered 2018-03-05 – 2018-03-08 (×4): 81 mg via ORAL
  Filled 2018-03-04 (×4): qty 1

## 2018-03-04 MED ORDER — HYDROMORPHONE HCL 2 MG/ML IJ SOLN
INTRAMUSCULAR | Status: AC
  Filled 2018-03-04: qty 1

## 2018-03-04 MED ORDER — MENTHOL 3 MG MT LOZG: 1.0000 | LOZENGE | OROMUCOSAL | Status: DC | PRN

## 2018-03-04 MED ORDER — EPHEDRINE SULFATE 50 MG/ML IJ SOLN
INTRAMUSCULAR | Status: AC
  Filled 2018-03-04: qty 1

## 2018-03-04 MED ORDER — INSULIN ASPART 100 UNIT/ML IV SOLN
2.0000 [IU] | INTRAVENOUS | Status: AC
  Administered 2018-03-04: 2 [IU] via INTRAVENOUS
  Filled 2018-03-04: qty 0.02

## 2018-03-04 MED ORDER — ACETAMINOPHEN 325 MG PO TABS
650.0000 mg | ORAL_TABLET | ORAL | Status: DC | PRN
  Administered 2018-03-06 – 2018-03-07 (×3): 650 mg via ORAL
  Filled 2018-03-04 (×3): qty 2

## 2018-03-04 MED ORDER — SUCCINYLCHOLINE CHLORIDE 200 MG/10ML IV SOSY
PREFILLED_SYRINGE | INTRAVENOUS | Status: DC | PRN
  Administered 2018-03-04: 120 mg via INTRAVENOUS

## 2018-03-04 MED ORDER — PHENYLEPHRINE HCL 10 MG/ML IJ SOLN
INTRAVENOUS | Status: DC | PRN
  Administered 2018-03-04: 60 ug/min via INTRAVENOUS

## 2018-03-04 MED ORDER — INSULIN ASPART 100 UNIT/ML ~~LOC~~ SOLN
4.0000 [IU] | SUBCUTANEOUS | Status: AC
  Administered 2018-03-04: 4 [IU] via SUBCUTANEOUS
  Filled 2018-03-04: qty 0.04

## 2018-03-04 MED ORDER — DEXAMETHASONE SODIUM PHOSPHATE 10 MG/ML IJ SOLN
INTRAMUSCULAR | Status: DC | PRN
  Administered 2018-03-04: 10 mg via INTRAVENOUS

## 2018-03-04 MED ORDER — LIDOCAINE HCL (CARDIAC) PF 100 MG/5ML IV SOSY
PREFILLED_SYRINGE | INTRAVENOUS | Status: DC | PRN
  Administered 2018-03-04: 80 mg via INTRAVENOUS

## 2018-03-04 MED ORDER — AMLODIPINE BESYLATE 5 MG PO TABS
5.0000 mg | ORAL_TABLET | Freq: Every day | ORAL | Status: DC
  Administered 2018-03-05: 5 mg via ORAL
  Filled 2018-03-04: qty 1

## 2018-03-04 MED ORDER — LINAGLIPTIN 5 MG PO TABS
5.0000 mg | ORAL_TABLET | Freq: Every day | ORAL | Status: DC
  Administered 2018-03-05 – 2018-03-06 (×2): 5 mg via ORAL
  Filled 2018-03-04 (×2): qty 1

## 2018-03-04 MED ORDER — PROPOFOL 1000 MG/100ML IV EMUL
INTRAVENOUS | Status: AC
  Filled 2018-03-04: qty 100

## 2018-03-04 MED ORDER — ROCURONIUM BROMIDE 10 MG/ML (PF) SYRINGE
PREFILLED_SYRINGE | INTRAVENOUS | Status: AC
  Filled 2018-03-04: qty 5

## 2018-03-04 MED ORDER — BUPIVACAINE HCL 0.5 % IJ SOLN
INTRAMUSCULAR | Status: DC | PRN
  Administered 2018-03-04 (×2): 10 mL

## 2018-03-04 MED ORDER — ALBUMIN HUMAN 5 % IV SOLN
INTRAVENOUS | Status: DC | PRN
  Administered 2018-03-04 (×2): via INTRAVENOUS

## 2018-03-04 MED ORDER — CHLORHEXIDINE GLUCONATE 4 % EX LIQD: 60.0000 mL | Freq: Once | CUTANEOUS | Status: DC

## 2018-03-04 MED ORDER — MIDAZOLAM HCL 5 MG/5ML IJ SOLN
INTRAMUSCULAR | Status: DC | PRN
  Administered 2018-03-04: 1 mg via INTRAVENOUS

## 2018-03-04 MED ORDER — PROPOFOL 500 MG/50ML IV EMUL
INTRAVENOUS | Status: DC | PRN
  Administered 2018-03-04: 50 ug/kg/min via INTRAVENOUS
  Administered 2018-03-04: 11:00:00 via INTRAVENOUS

## 2018-03-04 MED ORDER — LIDOCAINE 2% (20 MG/ML) 5 ML SYRINGE
INTRAMUSCULAR | Status: AC
  Filled 2018-03-04: qty 5

## 2018-03-04 MED ORDER — SODIUM CHLORIDE 0.9% FLUSH: 3.0000 mL | INTRAVENOUS | Status: DC | PRN

## 2018-03-04 MED ORDER — CEFAZOLIN SODIUM-DEXTROSE 2-4 GM/100ML-% IV SOLN
INTRAVENOUS | Status: AC
  Filled 2018-03-04: qty 100

## 2018-03-04 SURGICAL SUPPLY — 89 items
ADH SKN CLS APL DERMABOND .7 (GAUZE/BANDAGES/DRESSINGS) ×1
AGENT HMST MTR 8 SURGIFLO (HEMOSTASIS)
BLADE CLIPPER SURG (BLADE) IMPLANT
BONE CANC CHIPS 40CC CAN1/2 (Bone Implant) ×2 IMPLANT
BONE VIVIGEN FORMABLE 10CC (Bone Implant) ×2 IMPLANT
BUR MATCHSTICK NEURO 3.0 LAGG (BURR) ×2 IMPLANT
BUR SABER RD CUTTING 3.0 (BURR) ×1 IMPLANT
CHIPS CANC BONE 40CC CAN1/2 (Bone Implant) ×1 IMPLANT
CONNECTOR CROSS SFX A2 (Connector) ×1 IMPLANT
CONNECTOR EXPEDIUM TI 55MM (Connector) ×3 IMPLANT
COVER BACK TABLE 80X110 HD (DRAPES) ×2 IMPLANT
COVER SURGICAL LIGHT HANDLE (MISCELLANEOUS) ×2 IMPLANT
DECANTER SPIKE VIAL GLASS SM (MISCELLANEOUS) ×2 IMPLANT
DERMABOND ADVANCED (GAUZE/BANDAGES/DRESSINGS) ×1
DERMABOND ADVANCED .7 DNX12 (GAUZE/BANDAGES/DRESSINGS) ×1 IMPLANT
DRAPE C-ARM 42X72 X-RAY (DRAPES) ×2 IMPLANT
DRAPE C-ARMOR (DRAPES) ×2 IMPLANT
DRAPE MICROSCOPE LEICA (MISCELLANEOUS) ×2 IMPLANT
DRAPE POUCH INSTRU U-SHP 10X18 (DRAPES) ×2 IMPLANT
DRAPE SURG 17X23 STRL (DRAPES) ×8 IMPLANT
DRSG MEPILEX BORDER 4X12 (GAUZE/BANDAGES/DRESSINGS) ×1 IMPLANT
DRSG MEPILEX BORDER 4X4 (GAUZE/BANDAGES/DRESSINGS) IMPLANT
DRSG MEPILEX BORDER 4X8 (GAUZE/BANDAGES/DRESSINGS) IMPLANT
DURAPREP 26ML APPLICATOR (WOUND CARE) ×2 IMPLANT
ELECT BLADE 4.0 EZ CLEAN MEGAD (MISCELLANEOUS) ×2
ELECT BLADE 6.5 EXT (BLADE) IMPLANT
ELECT CAUTERY BLADE 6.4 (BLADE) ×2 IMPLANT
ELECT REM PT RETURN 9FT ADLT (ELECTROSURGICAL) ×2
ELECTRODE BLDE 4.0 EZ CLN MEGD (MISCELLANEOUS) ×1 IMPLANT
ELECTRODE REM PT RTRN 9FT ADLT (ELECTROSURGICAL) ×1 IMPLANT
EVACUATOR 1/8 PVC DRAIN (DRAIN) ×1 IMPLANT
FEE INTRAOP MONITOR IMPULS NCS (MISCELLANEOUS) IMPLANT
GAUZE SPONGE 4X4 12PLY STRL (GAUZE/BANDAGES/DRESSINGS) ×1 IMPLANT
GLOVE BIOGEL PI IND STRL 8 (GLOVE) ×1 IMPLANT
GLOVE BIOGEL PI INDICATOR 8 (GLOVE) ×1
GLOVE ECLIPSE 9.0 STRL (GLOVE) ×2 IMPLANT
GLOVE ORTHO TXT STRL SZ7.5 (GLOVE) ×2 IMPLANT
GLOVE SURG 8.5 LATEX PF (GLOVE) ×2 IMPLANT
GOWN STRL REUS W/ TWL LRG LVL3 (GOWN DISPOSABLE) ×1 IMPLANT
GOWN STRL REUS W/TWL 2XL LVL3 (GOWN DISPOSABLE) ×4 IMPLANT
GOWN STRL REUS W/TWL LRG LVL3 (GOWN DISPOSABLE) ×10
GRAFT BNE CHIP CANC 1-8 40 (Bone Implant) IMPLANT
GRAFT BNE MATRIX VG FRMBL L 10 (Bone Implant) IMPLANT
INTRAOP MONITOR FEE IMPULS NCS (MISCELLANEOUS) ×1
INTRAOP MONITOR FEE IMPULSE (MISCELLANEOUS) ×1
KIT BASIN OR (CUSTOM PROCEDURE TRAY) ×2 IMPLANT
KIT POSITION SURG JACKSON T1 (MISCELLANEOUS) ×2 IMPLANT
KIT TURNOVER KIT B (KITS) ×2 IMPLANT
NDL SPNL 18GX3.5 QUINCKE PK (NEEDLE) ×1 IMPLANT
NEEDLE 22X1 1/2 (OR ONLY) (NEEDLE) ×2 IMPLANT
NEEDLE SPNL 18GX3.5 QUINCKE PK (NEEDLE) ×2 IMPLANT
NS IRRIG 1000ML POUR BTL (IV SOLUTION) ×2 IMPLANT
PACK LAMINECTOMY ORTHO (CUSTOM PROCEDURE TRAY) ×2 IMPLANT
PAD ARMBOARD 7.5X6 YLW CONV (MISCELLANEOUS) ×4 IMPLANT
PATTIES SURGICAL .5 X.5 (GAUZE/BANDAGES/DRESSINGS) ×1 IMPLANT
PATTIES SURGICAL .5 X1 (DISPOSABLE) ×1 IMPLANT
PATTIES SURGICAL .75X.75 (GAUZE/BANDAGES/DRESSINGS) ×2 IMPLANT
PATTIES SURGICAL 1X1 (DISPOSABLE) ×2 IMPLANT
PROBE PEDCLE PROBE MAGSTM DISP (MISCELLANEOUS) ×1 IMPLANT
ROD SPINAL EXP LVL1 5.5X480 (Rod) ×1 IMPLANT
SCREW CORT FIX FEN 5.5X5X45MM (Screw) ×2 IMPLANT
SCREW CORT FIX FEN 5.5X6X40MM (Screw) ×6 IMPLANT
SCREW CORT FIX FEN 5.5X6X45MM (Screw) ×2 IMPLANT
SCREW SET SINGLE INNER (Screw) ×10 IMPLANT
SPACER CONCORDE PRO 9X9X27 (Spacer) ×2 IMPLANT
SPOGE SURGIFLO 8M (HEMOSTASIS)
SPONGE LAP 18X18 X RAY DECT (DISPOSABLE) ×5 IMPLANT
SPONGE NEURO XRAY DETECT 1X3 (DISPOSABLE) IMPLANT
SPONGE SURGIFLO 8M (HEMOSTASIS) IMPLANT
SPONGE SURGIFOAM ABS GEL 100 (HEMOSTASIS) ×2 IMPLANT
STAPLER VISISTAT 35W (STAPLE) ×1 IMPLANT
SUT VIC AB 0 CT1 27 (SUTURE) ×4
SUT VIC AB 0 CT1 27XBRD ANBCTR (SUTURE) ×1 IMPLANT
SUT VIC AB 1 CTX 27 (SUTURE) ×2 IMPLANT
SUT VIC AB 1 CTX 36 (SUTURE) ×4
SUT VIC AB 1 CTX36XBRD ANBCTR (SUTURE) ×2 IMPLANT
SUT VIC AB 2-0 CT1 27 (SUTURE) ×2
SUT VIC AB 2-0 CT1 TAPERPNT 27 (SUTURE) ×1 IMPLANT
SUT VIC AB 3-0 X1 27 (SUTURE) ×2 IMPLANT
SYR 20CC LL (SYRINGE) ×2 IMPLANT
SYR CONTROL 10ML LL (SYRINGE) ×4 IMPLANT
TAP CANN VIPER2 DL 5.0 (TAP) ×1 IMPLANT
TAP CANN VIPER2 DL 6.0 (TAP) ×1 IMPLANT
TAP VIPER MIS 4.35MM (TAP) ×1 IMPLANT
TOWEL GREEN STERILE (TOWEL DISPOSABLE) ×2 IMPLANT
TOWEL GREEN STERILE FF (TOWEL DISPOSABLE) ×2 IMPLANT
TRAY FOLEY MTR SLVR 16FR STAT (SET/KITS/TRAYS/PACK) ×2 IMPLANT
WATER STERILE IRR 1000ML POUR (IV SOLUTION) ×2 IMPLANT
YANKAUER SUCT BULB TIP NO VENT (SUCTIONS) ×2 IMPLANT

## 2018-03-04 NOTE — Transfer of Care (Signed)
Immediate Anesthesia Transfer of Care Note  Patient: [name]  Procedure(s) Performed: Right transforaminal lumbar interbody fusion L1-2, L2-3 Posterior fusion T10, T11, T12, L1, L2 with T 11, T12 pedicle screws, superior sublaminar hooks T10, local bone graft, allograft cancellous chips Vivigen (N/A Spine Lumbar)  Patient Location: PACU  Anesthesia Type:General  Level of Consciousness: awake, alert  and oriented  Airway & Oxygen Therapy: Patient Spontanous Breathing and Patient connected to face mask oxygen  Post-op Assessment: Report given to RN, Post -op Vital signs reviewed and stable and Patient moving all extremities X 4  Post vital signs: Reviewed and stable  Last Vitals:  Vitals Value Taken Time  BP 100/54 03/04/2018  3:39 PM  Temp    Pulse 112 03/04/2018  3:46 PM  Resp 11 03/04/2018  3:46 PM  SpO2 97 % 03/04/2018  3:46 PM  Vitals shown include unvalidated device data.  Last Pain:  Vitals:   03/04/18 0635  TempSrc:   PainSc: 0-No pain      Patients Stated Pain Goal: 5 (03/04/18 6045)  Complications: No apparent anesthesia complications

## 2018-03-04 NOTE — Anesthesia Procedure Notes (Signed)
Arterial Line Insertion Start/End5/21/2019 7:00 AM, 03/04/2018 7:13 AM Performed by: Rogelia Boga, CRNA, CRNA  Patient sedated Left, radial was placed Catheter size: 20 G Hand hygiene performed  and maximum sterile barriers used   Attempts: 2 Procedure performed without using ultrasound guided technique. Following insertion, dressing applied and Biopatch. Post procedure assessment: normal and unchanged  Patient tolerated the procedure well with no immediate complications. Additional procedure comments: A-line placed by Laural Golden, SRNA.

## 2018-03-04 NOTE — Op Note (Signed)
03/04/2018  3:21 PM  PATIENT:  [name]  67 y.o. 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     SURGEON:  Jessy Oto, MD     ASSISTANT:  Benjiman Core, PA-C  (Present throughout the entire procedure and necessary for completion of procedure in a timely manner)     ANESTHESIA:  General, supplemented with local anesthetic marcaine 0.5% 1:1 exparel `1.3% total 30CC. Kerrie Pleasure, CRNA.    COMPLICATIONS:  None.   DRAINS: Foley to SD. Hemovac right lower lumbar spine to the thoracolumbar spine midline to closed suction container charged.   INTRAOP COAGULATION MEDS: TXA 1 gram given IV.     COMPONENTS: Implant Name Type Inv. Item Serial No. Manufacturer Lot No. LRB No. Used  SCREW CORT FIX FEN 5.5X5X45MM - [id number] Screw SCREW CORT FIX FEN 5.5X5X45MM  [employer]   2  SCREW CORT FIX FEN 5.5X6X45MM - [id number] Screw SCREW CORT FIX FEN 5.5X6X45MM  [employer]   2  SCREW CORT FIX FEN 5.5X6X40MM - [id number] Screw SCREW CORT FIX FEN 5.5X6X40MM  [employer]   6  SCREW SET SINGLE INNER - [id number] Screw SCREW SET SINGLE INNER  [employer]   10  BONE VIVIGEN FORMABLE 10CC - [id number] Bone Implant BONE VIVIGEN FORMABLE 10CC [id number] [facility]   1  BONE [facility] CHIPS 40CC - [id number] Bone Implant BONE [facility] CHIPS 40CC [id number]  [facility] [id number]  1  SPACER CONCORDE PRO 9X9X27 - [id number] Spacer SPACER CONCORDE PRO [id number]  [employer] [id number]  2  CONNECTOR EXPEDIUM TI 55MM - [id number] Connector CONNECTOR EXPEDIUM TI 55MM  [employer]   3  CONNECTOR CROSS SFX A2 - [id number] Connector CONNECTOR CROSS SFX A2  [employer]   1  EXPEDIUM ROD     DEPUY SYNTHES   1    INTRA NEUROMONITORING STUDIES: T10   Left 20+  Right  14 T11   Left 15    Right  13 T12   Left 20+  Right  20+  L1    Left 20+  Right  16  L2    Left 20+  RIght 20 No intraoperative neuromonitoring changes noted during the entire case including both sensory evoked potentials and motor evoked potentials.    PROCEDURE:    The patient was met in the holding area, and the appropriate T10 to Right L1-2 and L2-3 lumbar levels identified and marked with an "X" and my initials. I had discussion with the patient in the preop holding area regarding consent form. Patient understands the rationale for the fusion site as the L1-2 and L2-3 segments for stenosis and degenerative spondylolisthesis with recurrent herniated disc right L2-3 above previous fusion L3 to S1 with extension of the fusion to the T10 level inorder to bridge the thoracolumbar junction.  The patient was then transported to OR and was placed under general anestheticwithout difficulty. The patient received appropriate preoperative  antibiotic prophylaxis ancef 2 gm.  Nursing staff inserted a Foley catheter under sterile conditions. The patient was then turned to a prone position using the [place] spine frame. PAS. all pressure points well padded the arms at the side to 90 90. Standard prep with DuraPrep solution draped in the usual manner from the mid dorsal spine to the sacral segment. Iodine Vi-Drape was used and the incision was marked. Time-out procedure was called and correct. Loupe magnification and headlight were used during this portion  procedure.  Skin in the midline between D9 and L5 was then infiltrated with local marcaine 0.5% 1:1 exparel 1.3% total of 30 cc used. Incision was then made through the skin and subcutaneous layers down to the patient's lumbodorsal fascia and spinous processes. The incision then carried sharply along the supraspinous ligament and then continuing the lateral aspects of the spinous processes T10, T11, T12,L1 and L2 and carried lateral over the lateral facets at L2-3 and L4 and L5.Belenda Cruise elevators used to carefully elevate the paralumbar muscles off of the posterior elements using electrocautery carefully drilled bleeding and perform dissection of the muscle tissues of the resecting the facet capsules at T10-T11, T11-T12, T12-L1and L1-2 and L2-3. Continuing the exposure out laterally to expose the retained pedicle screws and rods at L3, L4 and  L5. Bleeding controlled using electrocautery monopolar electrocautery.  Viper retractor was used for the lower part of the incision. Cerebellar retractors in the cephalad portion of the incision.   C-arm fluoroscopy was then brought into the field and using C-arm fluoroscopy then a hole made into the lateral aspect of the left pedicle of L2 observed in the pedicle using ball-tipped nerve hook and hockey stick nerve probe initial entry was determined on fluoroscopy to be good position alignment so that a ball handled probe was then used to probe the left L2 pedicle to a depth of nearly 45 mm observed on C-arm fluoroscopy to be beyond the midpoint of the lumbar vertebra and then position alignment within the left L2 pedicle this was then removed and the pedicle channel probed demonstrating patency no sign of rupture the cortex of the pedicle. Tapping with a 5 mm screw then 5.0 mm x 45 mm screw was placed on the left side at the L2 level. C-arm fluoroscopy was then brought into the field and using C-arm fluoroscopy then a hole made into the middle of the pedicle of right L2  observed in the pedicle using ball tipped nerve hook and hockey stick nerve probe initial entry was determined on fluoroscopy to be good position alignment so that a ball handled probe was then used to probe the right L2 pedicle opening to a depth of nearly 45 mm observed on C-arm fluoroscopy to be beyond the midpoint of the lumbar vertebra and then position alignment within the right L2 pedicle this was then removed and the pedicle channel probed demonstrating patency no sign of rupture the cortex of the pedicle. Tapping up to a 5 mm screw tap then 5.0 mm x 45 mm screw was placed on the right side at the L2 level. C-arm fluoroscopy was then brought into the field and using C-arm fluoroscopy then a hole made into the lateral aspect of the left pedicle of L1 observed in the pedicle using ball tipped nerve hook and hockey stick nerve probe initial entry was determined on fluoroscopy to be good position alignment so that a ball handled probe was then used to probe the left L1 pedicle to  a depth of nearly 45 mm observed on C-arm fluoroscopy to be well aligned within the left L1 pedicle, the pedicle channel probed demonstrating patency no sign of rupture the cortex of the pedicle. Tapping with a 6 mm screw then 6.0 mm x 45 mm screw was placed  on the left side at the L1 level. C-arm fluoroscopy was used to localize the hole made in the lateral aspect of the pedicle of L1 on the right localizing the pedicle within the spinal canal with nerve hook and hockey-stick nerve probe carefully passed down the center of the L1 pedicle to a depth of nearly 45 mm. Observed on C-arm fluoroscopy to be in good position alignment channel was probed with a ball-tipped probe ensure patency no sign of cortical disruption. Following tapping with a 5 mm and then a 6.0 mm tap and a 6.0 x 45 mm screw was placed  on the right side at the L1 level. Using C-arm fluoroscopy then a hole made into the centrall aspect of the left pedicle of T12  observed in the pedicle using ball-tipped nerve hook and hockey stick nerve probe initial entry was determined on fluoroscopy to be good position alignment so that a ball handled probe was then used to probe the left T12 pedicle to a depth of nearly 40 mm observed on C-arm fluoroscopy to be beyond the midpoint of the lumbar vertebra and then position alignment within the left T12 pedicle this was then removed and the pedicle channel probed demonstrating patency no sign of rupture the cortex of the pedicle. Tapping with a 5 mm screw then 6.0 mm x 40 mm screw was placed on the left side at the T12 level. Using C-arm fluoroscopy then a hole made into the middle of the pedicle of right T12 observed in the pedicle using ball tipped nerve hook and hockey stick nerve probe initial entry was determined on fluoroscopy to be good position alignment so that a ball handled probe was then used to probe the right T12 pedicle opening to a depth of nearly 40 mm observed on C-arm fluoroscopy to be beyond the midpoint of the lumbar vertebra and then position alignment within the right T12 pedicle this was then removed and the pedicle channel probed demonstrating patency no sign of rupture the cortex of the pedicle. Tapping with a 5 mm screw tap and then a 6.0 tap then a 6.0 mm x 40 mm screw was placed on the right side at the T12 level. Using C-arm fluoroscopy then a hole made into the lateral aspect of the left pedicle of T11 observed in the pedicle using ball tipped nerve hook and hockey stick nerve probe initial entry was determined on fluoroscopy to be good position alignment so that a ball handled probe was then used to probe the left T11 pedicle to a depth of nearly 40 mm observed on C-arm fluoroscopy to be well aligned within the left T11 pedicle, the pedicle channel probed demonstrating patency no sign of rupture the cortex of the pedicle. Tapping with a 5 mm screw tap and then a 6.0 mm tap then a 6.0 mm x 40 mm screw was  placed  on the left side at the T11 level. C-arm fluoroscopy was used to localize the hole made in the central aspect of the pedicle of T11 on the right localizing the pedicle within the opening with a ball tipped probe and ball tipped probe carefully passed down the center of the T11 pedicle to a  depth of nearly 40 mm. Observed on C-arm fluoroscopy to be in good position alignment channel was probed with a ball-tipped probe ensure patency no sign of cortical disruption. Tapping with a 5 mm screw tap and then a 6.0 mm tap then a 6.0 mm x 40 mm screw was placed  on the right side at the T11 level. Using C-arm fluoroscopy then a hole made into the centrall aspect of the left pedicle of T10 observed in the pedicle using ball-tipped nerve hook and hockey stick nerve probe initial entry was determined on fluoroscopy to be good position alignment so that a ball handled probe was then used to probe the left T10 pedicle to a depth of nearly 40 mm observed on C-arm fluoroscopy to be beyond the midpoint of the lumbar vertebra and then position alignment within the left T10 pedicle this was then removed and the pedicle channel probed demonstrating patency no sign of rupture the cortex of the pedicle. Tapping with a 5 mm screw tap then a 6.0 mm tap then a 6.0 mm x 40 mm screw was placed on the left side at the T10 level. Using C-arm fluoroscopy then a hole made into the middle of the pedicle of right T10 observed in the pedicle using ball tipped nerve hook and hockey stick nerve probe initial entry was determined on fluoroscopy to be good position alignment so that a ball handled probe was then used to probe the right T10 pedicle opening to a depth of nearly 40 mm observed on C-arm fluoroscopy to be beyond the midpoint of the lumbar vertebra and then position alignment within the right T10 pedicle this was then removed and the pedicle channel probed demonstrating patency no sign of rupture the cortex of the pedicle. Tapping  with a 5 mm screw tap and a 6.5m tap then a 6.0 mm x 40 mm screw was placed on the right side at the T10 level. Intraoperative neuromonitoring was performed throughout the case and resistance testing then carried out of each of the pedicle screws the resistance value for each screw  listed above.   The right inferior articular process of L1and L2 were resected in order to provide for exposure of the right side L1-2 and L2-3 neuroforamen for ease of placement of TLIFs (transforaminal lumbar interbody fusion) essential portions of the lamina were also resected first beginning with the Leksell rongeur and then resecting using 2 and 3 mm Kerrison the central and right portions of the lamina of L1 and L2 performing foraminotomies on the right side at the L3 level. The inferior articular process L1 and L2 were resected on the right side. The L3 nerve root identified bilaterally and the medial aspect of the L3 pedicle. Superior articular process of L3 was then resected from the right side further decompressing the right L3 nerve and providing for exposure of the area just superior to the L3 pedicle for a placement of cage. A large disc herniation was found on the right side at the L2-3 level and this was resected. The OR microscope was draped sterilely and brought into the field to allow for freeing up of the right side of the thecal sac at the L2-3 Level elevating the L3 nerve root away from the medial right L3 pedicle, exposing the right L2-3 disc above the right L3 pedicle, Incising the disc and then resecting the disc with micropituitary and then pituitary with teeth.The right side recut resecting the superior articular process of L3 overlying the L2  nerve root as it exited at the L2-3 level decompressing the lateral recess along the medial aspect of the pedicle of L3 and resecting the superior to the process of L3 and decompressing the right L2 neuroforamen.The disc herniation did continue subligamentous along  the right side of the Posterior upper vertebral body of L3 ventral to the right L3 nerve root. The posterior longitudinal ligament was incised and the disc herniation material resected with nerve hook and micropituitary. A blunt nerve hook used to explore the spinal canal and ensure Both the right L3 and L2 nerve roots were well decompressed.At the L2 level similarly lateral recess and the neuroforamen were resected decompressing the L1and L2 nerve roots and foraminotomies was widely performed over the right L1 nerve and L2 nerve roots.  Attention then turned to placement of the right transforaminal lumbar interbody fusion cages. Using a Penfield 4 the lateral aspect of the thecal sac and the inferior aspect of the L3 nerve root on the right side at the L2-3 level was carefully drilled. The thecal sac could then easily be retracted in the posterior lateral aspect of the L2-3 disc was exposed 15 blade scalpel used to incise the osteotome used to resect a small portion of bone off the superior aspect of the posterior superior vertebral body of L2 in order to ease the entry into the L2-3 disc space. A pituitary rongeur was then able to be introduced in the disc space debrided it of degenerative disc material. 7 mm dilator was used to dialate  the L2-3 disc space on the right side attempts were made to dilate further in increments to 9 mm successfully and using small curettes and the disc space was debrided a minimal degenerative disc present in the endplates debrided to bleeding endplate bone.Trial cage placement within the disc place could only allow for an 9 mm lordotic bullet proTi cage. A 76m bullet proTi cage was carefully packed with Vivigen and local bone graft that been harvested from previous laminotomies additional bone graft local morselized, cancellous allograft chips and vivigen was then packed into the intervertebral disc space using the 7 mm trial to impacted the graft multiple times. With this then  a 9 mm x 27 mm LIFT cage was introdudced into the disc space on the right side in the correct degree convergence and then impacted then subset beneath the posterior aspect of the disc space by about 3 or 4 mm. The cage the elevated using the long screw drive. Then packing of the cage carried out with the sleeve. Bleeding controlled using bipolar electrocautery thrombin soaked gel cottonoids. With the right sided decompression the right L2 pedicle was preserved. Then turned to the right L1-2 level similarly the exposure the posterior lateral aspect this was carried out using a Penfield 4 bipolar electrocautery to control small bleeders present. Derricho retractor used to retract the thecal sac and nerve root, a 15 blade scalpel was used to incise posterior lateral aspect of the disc at the L1-2 disc space.The space was debrided of degenerative disc material using pituitary along root the entire disc space was then debrided of degenerative disc material using pituitary rongeurs curettage down to bleeding bone endplates. Residual disc was resected using pituitary. This space was then carefully sounded to a 9 mm cage trial provided the best fit the 9 mm x 220mBullet ProTi cage was chosen the 9 mm x 27 mm. The intervertebral disc space was then packed with autogenous local bone graft that been harvested  from the central laminectomy, cancellous allograft chips and vivigen and the trial was used to pack the graft using an 61m trial cage. This provided excellent bone graft within the intervertebral disc space at L1-2 so that the permanent [id number] 27 mm Bullet ProTi cage was then packed with local bone graft and vivigen  placed into the intervertebral disc space and impacted into place in the correct degree of convergence. The cage then deployed using the long screw driver. The cage then packed with vivigen. Bleeding controlled using bipolar electrocautery. The 9 mm Bullet ProTi cage was subset beneath the posterior aspect of  the about 3-4 mm .   Bleeding was hemostasis attention was turned to the placement of the rods and Rod sleeves. The exposed retained hardware bilateral L3 to L5 pedicle screw fasteners and rods. The old crosslink at the L3-4 level was removed using the insertion crosslink screw driver. The rod interconnecting sleeves connected to the old retained L3-S1 rod at the L3-4 level bilaterally. A second rod interconnecting sleeve was able to be placed on the right side between the retained old pedicle screws at the L4-5 level. The pedicle screws on the left were to close to allow for a second connecting sleeve.  With the transforaminal lumbar interbody fusion portion of the case  completed bleeders were controlled using bipolar electrocautery thrombin-soaked Gelfoam were appropriate. 5 pedicle screws on the left and 4  Pedicle screws on the right and then each carefully placed and aligned to allow for placement of rods. The [id number] was cut then contoured using a template on the left side and  placed into the pedicle screws on the left extending from L4-5 rod connector sleeve into each of the left L2 and L1 pedicle fastener extending into the fasteners at T12, T11 and T10 and each of the caps carefully placed loosely tightened. Attention turned to the right side were similarly screws were carefully adjusted to allow for a better pattern screws to allow for placement of fixation rod template for the rod was then taken and a cut quarter inch titanium rod was placed. The rod placed into the connector sleeves at the L3-5 level and reduced into the right L1, T12, T11 and T10 pedicle fasteners. The fastener caps were then placed. Both rod connector sleeve screws were tightened to 80 foot lbs. The right L1 rod fastener cap was then tightened 85 pounds similarly on at the right side disc space at L1-L3 screw fasteners were compressed  to compress the L2-3 disc and L1-2 disc and the respective TLIFs.  Posteriorly and the L1  fastener cap was torqued to 85 foot lbs, the right side between L1 and T12 then compressed by loosening the cap at the T12 level, using the compressor and tightening the right T12 cap to 85 foot lbs.  Similarly this was done on the left side at L1-2, L2-3 and T12-L1 sleeve screws were compressed and tightened 85 pounds.The areas between T10-T11 and T11-T12 were secured by tightening with the torque driver without distraction or compression. A single transverse loading rod was placed at the L2-3 level. Measuring and placing an A-2 transverse loading rod and tightening the attaching nuts to 80 foot lbs.  Irrigation was carried out with copious amounts of saline solution this was done throughout the case. Cell Saver was used during the case. Total cell saver returned blood was 860 cc. Permanent C-arm images were obtained in AP and lateral planes. Remaining local bone graft was then applied  along the left from T10  through L3 and right lateral posterior region extending from T10  through L1 posterior facets following decortication of the facets and along the left T10-L3 interlaminar area posteriorly. Gelfoam was then removed. The entire incision then layered with vancomycin powder 1 gram. The lumbodorsal musculature carefully exam debrided of any devitalized tissue following removal of Vicryl retractors were the bleeders were controlled using electrocautery and the area dorsal lumbar muscle were then approximated in the midline with interrupted #1 Vicryl sutures loose the dorsal fascia was reattached to the spinous process of T10, T11, T12, L1 and L2 to superiorly and  inferiorly this was done with #1 Vicryl sutures. The para lumbar muscle approximated over the lower incision site with #1 vicryl A Hemovac drain was placed deep to the lumbodorsal fascia exiting over the right lower lumbar spine. The subcutaneous layers then approximated over the drain using interrupted 0 Vicryl sutures and 2-0 Vicryl sutures. Skin was  closed with stainless steel staples then MedPlex bandage. All instrument and sponge counts were correct. The patient was then returned to a supine position on her bed reactivated extubated and returned to the recovery room in satisfactory condition.    Benjiman Core, PA-C perform the duties of assistant surgeon during this case. He was present from the beginning of the case to the end of the case assisting in transfer the patient from his stretcher to the OR table and back to the stretcher at the end of the case. Assisted in careful retraction and suction of the laminectomy site delicate neural structures operating under the operating room microscope. He performed closure of the incision from the fascia to the skin applying the dressing.                      Basil Dess  03/04/2018, 3:21 PM

## 2018-03-04 NOTE — Anesthesia Preprocedure Evaluation (Addendum)
Anesthesia Evaluation  Patient identified by MRN, date of birth, ID band Patient awake    Reviewed: Allergy & Precautions, NPO status , Patient's Chart, lab work & pertinent test results  History of Anesthesia Complications Negative for: history of anesthetic complications  Airway Mallampati: II  TM Distance: >3 FB     Dental  (+) Edentulous Upper, Edentulous Lower   Pulmonary asthma , COPD,    breath sounds clear to auscultation       Cardiovascular hypertension, Pt. on medications  Rhythm:Regular Rate:Normal     Neuro/Psych  Headaches,  Neuromuscular disease    GI/Hepatic negative GI ROS, Neg liver ROS,   Endo/Other  diabetes, Well Controlled, Type 2, Insulin Dependent  Renal/GU negative Renal ROS     Musculoskeletal  (+) Arthritis , Osteoarthritis,    Abdominal   Peds  Hematology   Anesthesia Other Findings   Reproductive/Obstetrics                            Anesthesia Physical Anesthesia Plan  ASA: III  Anesthesia Plan: General   Post-op Pain Management:    Induction:   PONV Risk Score and Plan: 4 or greater and Dexamethasone and Ondansetron  Airway Management Planned: Oral ETT  Additional Equipment: Arterial line  Intra-op Plan:   Post-operative Plan: Extubation in OR and Possible Post-op intubation/ventilation  Informed Consent: I have reviewed the patients History and Physical, chart, labs and discussed the procedure including the risks, benefits and alternatives for the proposed anesthesia with the patient or authorized representative who has indicated his/her understanding and acceptance.   Dental advisory given  Plan Discussed with: CRNA, Anesthesiologist and Surgeon  Anesthesia Plan Comments:        Anesthesia Quick Evaluation

## 2018-03-04 NOTE — Discharge Instructions (Addendum)
Laminectomy, Care After This sheet gives you information about how to care for yourself after your procedure. Your health care provider may also give you more specific instructions. If you have problems or questions, contact your health care provider. What can I expect after the procedure? After the procedure, it is common to have:  Some pain around your incision area.  Muscle tightening (spasms) across the back.  Follow these instructions at home: Incision care  Follow instructions from your health care provider about how to take care of your incision area. Make sure you: ? Wash your hands with soap and water before and after you apply medicine to the area or change your bandage (dressing). If soap and water are not available, use hand sanitizer. ? Change your dressing as told by your health care provider. ? Leave stitches (sutures), skin glue, or adhesive strips in place. These skin closures may need to stay in place for 2 weeks or longer. If adhesive strip edges start to loosen and curl up, you may trim the loose edges. Do not remove adhesive strips completely unless your health care provider tells you to do that.  Check your incision area every day for signs of infection. Check for: ? More redness, swelling, or pain. ? More fluid or blood. ? Warmth. ? Pus or a bad smell. Medicines  Take over-the-counter and prescription medicines only as told by your health care provider.  If you were prescribed an antibiotic medicine, use it as told by your health care provider. Do not stop using the antibiotic even if you start to feel better. Bathing  Do not take baths, swim, or use a hot tub for 2 weeks, or until your incision has healed completely.  If your health care provider approves, you may take showers after your dressing has been removed. Activity  Return to your normal activities as told by your health care provider. Ask your health care provider what activities are safe for  you.  Avoid bending or twisting at your waist. Always bend at your knees.  Do not sit for more than 20-30 minutes at a time. Lie down or walk between periods of sitting.  Do not lift anything that is heavier than 10 lb (4.5 kg) or the limit that your health care provider tells you, until he or she says that it is safe.  Do not drive for 2 weeks after your procedure or for as long as your health care provider tells you.  Do not drive or use heavy machinery while taking prescription pain medicine. General instructions  To prevent or treat constipation while you are taking prescription pain medicine, your health care provider may recommend that you: ? Drink enough fluid to keep your urine clear or pale yellow. ? Take over-the-counter or prescription medicines. ? Eat foods that are high in fiber, such as fresh fruits and vegetables, whole grains, and beans. ? Limit foods that are high in fat and processed sugars, such as fried and sweet foods.  Do breathing exercises as told.  Keep all follow-up visits as told by your health care provider. This is important. Contact a health care provider if:  You have more redness, swelling, or pain around your incision area.  Your incision feels warm to the touch.  You are not able to return to activities or do exercises as told by your health care provider. Get help right away if:  You have: ? More fluid or blood coming from your incision area. ? Pus  or a bad smell coming from your incision area. ? Chills or a fever. ? Episodes of dizziness or fainting while standing.  You develop a rash.  You develop shortness of breath or you have difficulty breathing.  You cannot control when you urinate or have a bowel movement.  You become weak.  You are not able to use your legs. Summary  After the procedure, it is common to have some pain around your incision area. You may also have muscle tightening (spasms) across the back.  Follow  instructions from your health care provider about how to care for your incision.  Do not lift anything that is heavier than 10 lb (4.5 kg) or the limit that your health care provider tells you, until he or she says that it is safe.  Contact your health care provider if you have more redness, swelling, or pain around your incision area or if your incision feels warm to the touch. These can be signs of infection. This information is not intended to replace advice given to you by your health care provider. Make sure you discuss any questions you have with your health care provider. Document Released: 04/20/2005 Document Revised: 05/17/2016 Document Reviewed: 03/18/2016 Elsevier Interactive Patient Education  2018 [employer].   Spinal Stenosis Spinal stenosis occurs when the open space (spinal canal) between the bones of your spine (vertebrae) narrows, putting pressure on the spinal cord or nerves. What are the causes? This condition is caused by areas of bone pushing into the central canals of your vertebrae. This condition may be present at birth (congenital), or it may be caused by:  Arthritic deterioration of your vertebrae (spinal degeneration). This usually starts around age 52.  Injury or trauma to the spine.  Tumors in the spine.  Calcium deposits in the spine.  What are the signs or symptoms? Symptoms of this condition include:  Pain in the neck or back that is generally worse with activities, particularly when standing and walking.  Numbness, tingling, hot or cold sensations, weakness, or weariness in your legs.  Pain going up and down the leg (sciatica).  Frequent episodes of falling.  A foot-slapping gait that leads to muscle weakness.  In more serious cases, you may develop:  Problemspassing stool or passing urine.  Difficulty having sex.  Loss of feeling in part or all of your leg.  Symptoms may come on slowly and get worse over time. How is this  diagnosed? This condition is diagnosed based on your medical history and a physical exam. Tests will also be done, such as:  MRI.  CT scan.  X-ray.  How is this treated? Treatment for this condition often focuses on managing your pain and any other symptoms. Treatment may include:  Practicing good posture to lessen pressure on your nerves.  Exercising to strengthen muscles, build endurance, improve balance, and maintain good joint movement (range of motion).  Losing weight, if needed.  Taking medicines to reduce swelling, inflammation, or pain.  Assistive devices, such as a corset or brace.  In some cases, surgery may be needed. The most common procedure is decompression laminectomy. This is done to remove excess bone that puts pressure on your nerve roots. Follow these instructions at home: Managing pain, stiffness, and swelling  Do all exercises and stretches as told by your health care provider.  Practice good posture. If you were given a brace or a corset, wear it as told by your health care provider.  Do not do any  activities that cause pain. Ask your health care provider what activities are safe for you.  Do not lift anything that is heavier than 10 lb (4.5 kg) or the limit that your health care provider tells you.  Maintain a healthy weight. Talk with your health care provider if you need help losing weight.  If directed, apply heat to the affected area as often as told by your health care provider. Use the heat source that your health care provider recommends, such as a moist heat pack or a heating pad. ? Place a towel between your skin and the heat source. ? Leave the heat on for 20-30 minutes. ? Remove the heat if your skin turns bright red. This is especially important if you are not able to feel pain, heat, or cold. You may have a greater risk of getting burned. General instructions  Take over-the-counter and prescription medicines only as told by your health  care provider.  Do not use any products that contain nicotine or tobacco, such as cigarettes and e-cigarettes. If you need help quitting, ask your health care provider.  Eat a healthy diet. This includes plenty of fruits and vegetables, whole grains, and low-fat (lean) protein.  Keep all follow-up visits as told by your health care provider. This is important. Contact a health care provider if:  Your symptoms do not get better or they get worse.  You have a fever. Get help right away if:  You have new or worse pain in your neck or upper back.  You have severe pain that cannot be controlled with medicines.  You are dizzy.  You have vision problems, blurred vision, or double vision.  You have a severe headache that is worse when you stand.  You have nausea or you vomit.  You develop new or worse numbness or tingling in your back or legs.  You have pain, redness, swelling, or warmth in your arm or leg. Summary  Spinal stenosis occurs when the open space (spinal canal) between the bones of your spine (vertebrae) narrows. This narrowing puts pressure on the spinal cord or nerves.  Spinal stenosis can cause numbness, weakness, or pain in the neck, back, and legs.  This condition may be caused by a birth defect, arthritic deterioration of your vertebrae, injury, tumors, or calcium deposits.  This condition is usually diagnosed with MRIs, CT scans, and X-rays. This information is not intended to replace advice given to you by your health care provider. Make sure you discuss any questions you have with your health care provider. Document Released: 12/22/2003 Document Revised: 09/05/2016 Document Reviewed: 09/05/2016 Elsevier Interactive Patient Education  2018 [employer].   Spinal Stenosis Spinal stenosis happens when the open space (spinal canal) between the bones of your spine (vertebrae) gets smaller. It is caused by bone pushing into the open spaces of your backbone (spine).  This puts pressure on your backbone and the nerves in your backbone. Treatment often focuses on managing any pain and symptoms. In some cases, surgery may be needed. Follow these instructions at home: Managing pain, stiffness, and swelling  Do all exercises and stretches as told by your doctor.  Stand and sit up straight (use good posture). If you were given a brace or a corset, wear it as told by your doctor.  Do not do any activities that cause pain. Ask your doctor what activities are safe for you.  Do not lift anything that is heavier than 10 lb (4.5 kg) or heavier than  your doctor tells you.  Try to stay at a healthy weight. Talk with your doctor if you need help losing weight.  If directed, put heat on the affected area as often as told by your doctor. Use the heat source that your doctor recommends, such as a moist heat pack or a heating pad. ? Put a towel between your skin and the heat source. ? Leave the heat on for 20-30 minutes. ? Remove the heat if your skin turns bright red. This is especially important if you are not able to feel pain, heat, or cold. You may have a greater risk of getting burned. General instructions  Take over-the-counter and prescription medicines only as told by your doctor.  Do not use any products that contain nicotine or tobacco, such as cigarettes and e-cigarettes. If you need help quitting, ask your doctor.  Eat a healthy diet. This includes plenty of fruits and vegetables, whole grains, and low-fat (lean) protein.  Keep all follow-up visits as told by your doctor. This is important. Contact a doctor if:  Your symptoms do not get better.  Your symptoms get worse.  You have a fever. Get help right away if:  You have new or worse pain in your neck or upper back.  You have very bad pain that medicine does not control.  You are dizzy.  You have vision problems, blurred vision, or double vision.  You have a very bad headache that is worse  when you stand.  You feel sick to your stomach (nauseous).  You throw up (vomit).  You have new or worse numbness or tingling in your back or legs.  You have pain, redness, swelling, or warmth in your arm or leg. Summary  Spinal stenosis happens when the open space (spinal canal) between the bones of your spine gets smaller (narrow).  Contact a doctor if your symptoms get worse.  In some cases, surgery may be needed. This information is not intended to replace advice given to you by your health care provider. Make sure you discuss any questions you have with your health care provider. Document Released: 01/25/2011 Document Revised: 09/05/2016 Document Reviewed: 09/05/2016 Elsevier Interactive Patient Education  2017 [employer].    Call if there is increasing drainage, fever greater than 101.5, severe head aches, and worsening nausea or light sensitivity. If shortness of breath, bloody cough or chest tightness or pain go to an emergency room. No lifting greater than 10 lbs. Avoid bending, stooping and twisting. Use brace when sitting and out of bed even to go to bathroom. Walk in house for first 2 weeks then may start to get out slowly increasing distances up to one quarter mile by 4-6 weeks post op. After 5 days may shower and change dressing following bathing with shower.When bathing remove the brace shower and replace brace before getting out of the shower. If drainage, keep dry dressing and do not bathe the incision, use an moisture impervious dressing. Please call and return for scheduled follow up appointment in 1 1/ 2 weeks from the time of discharge for staple removal.

## 2018-03-04 NOTE — Anesthesia Procedure Notes (Signed)
Procedure Name: Intubation Date/Time: 03/04/2018 7:51 AM Performed by: Carney Living, CRNA Pre-anesthesia Checklist: Patient identified, Emergency Drugs available, Suction available, Patient being monitored and Timeout performed Patient Re-evaluated:Patient Re-evaluated prior to induction Oxygen Delivery Method: Circle system utilized Preoxygenation: Pre-oxygenation with 100% oxygen Induction Type: IV induction Ventilation: Mask ventilation without difficulty Laryngoscope Size: Mac and 4 Grade View: Grade I Tube type: Oral Tube size: 7.5 mm Number of attempts: 1 Airway Equipment and Method: Stylet Placement Confirmation: ETT inserted through vocal cords under direct vision,  positive ETCO2 and breath sounds checked- equal and bilateral Secured at: 22 cm Tube secured with: Tape Dental Injury: Teeth and Oropharynx as per pre-operative assessment  Comments: Performed by Garner Gavel SRNA under direct supervision

## 2018-03-04 NOTE — Brief Op Note (Signed)
03/04/2018  3:11 PM  PATIENT:  [name]  67 y.o. 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, Degenerative Disc Disease retrolisthesis L1-2, Diffuse spondylosis thoracic spine  PROCEDURE:  Procedure(s): Right transforaminal lumbar interbody fusion L1-2, L2-3 Posterior fusion T10, T11, T12, L1, L2 with T 11, T12 pedicle screws, superior sublaminar hooks T10, local bone graft, allograft cancellous chips Vivigen (N/A)  SURGEON:  Surgeon(s) and Role:    * Kerrin Champagne, MD - Primary  PHYSICIAN ASSISTANT: Zonia Kief, PA-C  ANESTHESIA:   local and general, Kirt Boys CRNA  EBL:  1700 mL   BLOOD ADMINISTERED: 860 CC CELLSAVER  DRAINS: (Medium ) Hemovact drain(s) in the right lower lumbar to the midline incision.  with  Suction Open and Urinary Catheter (Foley)   LOCAL MEDICATIONS USED:  MARCAINE 0.5% 1:1 EXPAREL 1.3% Amount: 30 ml  SPECIMEN:  No Specimen  DISPOSITION OF SPECIMEN:  N/A  COUNTS:  YES  TOURNIQUET:  * No tourniquets in log *  DICTATION: .Dragon Dictation  PLAN OF CARE: Admit to inpatient   PATIENT DISPOSITION:  PACU - hemodynamically stable.   Delay start of Pharmacological VTE agent (>24hrs) due to surgical blood loss or risk of bleeding: yes

## 2018-03-04 NOTE — Interval H&P Note (Signed)
History and Physical Interval Note:  03/04/2018 8:31 AM  [name]  has presented today for surgery, with the diagnosis of proximal junctional kyphosis and spinal stenosis L2-3, Degenerative Disc Disease retrolisthesis L1-2, Diffuse spondylosis thoracic spine  The various methods of treatment have been discussed with the patient and family. After consideration of risks, benefits and other options for treatment, the patient has consented to  Procedure(s): Right transforaminal lumbar interbody fusion L1-2, L2-3 Posterior fusion T10, T11, T12, L1, L2 with T 11, T12 pedicle screws, superior sublaminar hooks T10, local bone graft, allograft cancellous chips Vivigen (N/A) as a surgical intervention .  The patient's history has been reviewed, patient examined, no change in status, stable for surgery.  I have reviewed the patient's chart and labs.  Questions were answered to the patient's satisfaction.     [name]

## 2018-03-05 LAB — CBC
HCT: 30.6 % — ABNORMAL LOW (ref 36.0–46.0)
HCT: 29.9 % — ABNORMAL LOW (ref 36.0–46.0)
Hemoglobin: 9.6 g/dL — ABNORMAL LOW (ref 12.0–15.0)
Hemoglobin: 9.3 g/dL — ABNORMAL LOW (ref 12.0–15.0)
MCH: 30.4 pg (ref 26.0–34.0)
MCH: 30.3 pg (ref 26.0–34.0)
MCHC: 31.4 g/dL (ref 30.0–36.0)
MCHC: 31.1 g/dL (ref 30.0–36.0)
MCV: 96.8 fL (ref 78.0–100.0)
MCV: 97.4 fL (ref 78.0–100.0)
PLATELETS: 157 10*3/uL (ref 150–400)
PLATELETS: 152 10*3/uL (ref 150–400)
RBC: 3.16 MIL/uL — ABNORMAL LOW (ref 3.87–5.11)
RBC: 3.07 MIL/uL — ABNORMAL LOW (ref 3.87–5.11)
RDW: 13 % (ref 11.5–15.5)
RDW: 13.2 % (ref 11.5–15.5)
WBC: 14.2 10*3/uL — ABNORMAL HIGH (ref 4.0–10.5)
WBC: 14.6 10*3/uL — ABNORMAL HIGH (ref 4.0–10.5)

## 2018-03-05 LAB — BASIC METABOLIC PANEL
Anion gap: 8 (ref 5–15)
BUN: 16 mg/dL (ref 6–20)
CO2: 25 mmol/L (ref 22–32)
Calcium: 7.7 mg/dL — ABNORMAL LOW (ref 8.9–10.3)
Chloride: 103 mmol/L (ref 101–111)
Creatinine, Ser: 1.01 mg/dL — ABNORMAL HIGH (ref 0.44–1.00)
GFR calc Af Amer: >60 mL/min (ref >60)
GFR, EST NON AFRICAN AMERICAN: 56 mL/min — AB (ref >60)
Glucose, Bld: 219 mg/dL — ABNORMAL HIGH (ref 65–99)
Potassium: 4.3 mmol/L (ref 3.5–5.1)
SODIUM: 136 mmol/L (ref 135–145)

## 2018-03-05 LAB — GLUCOSE, CAPILLARY
GLUCOSE-CAPILLARY: 197 mg/dL — AB (ref 65–99)
GLUCOSE-CAPILLARY: 128 mg/dL — AB (ref 65–99)
Glucose-Capillary: 187 mg/dL — ABNORMAL HIGH (ref 65–99)

## 2018-03-05 MED ORDER — SODIUM CHLORIDE 0.9 % IV BOLUS
500.0000 mL | Freq: Once | INTRAVENOUS | Status: AC
  Administered 2018-03-05: 500 mL via INTRAVENOUS

## 2018-03-05 NOTE — NC FL2 (Signed)
[place] MEDICAID FL2 LEVEL OF CARE SCREENING TOOL     IDENTIFICATION  Patient Name: [name] Birthdate: Apr 27, 1951 Sex: female Admission Date (Current Location): 03/04/2018  [facility] and [place] Number:  [employer] and Address:  [facility] [place], [address] [place], [place] [address]      Provider Number: [id number]  Attending Physician Name and Address:  Kerrin Champagne, MD  Relative Name and Phone Number:  Melisssa Donner; sister; [phone]    Current Level of Care: Hospital Recommended Level of Care: Skilled Nursing Facility Prior Approval Number:    Date Approved/Denied:   PASRR Number: [id number]  Discharge Plan: SNF    Current Diagnoses: Patient Active Problem List   Diagnosis Date Noted  . Spinal stenosis, lumbar region with neurogenic claudication 03/04/2018    Class: Chronic  . Multilevel degenerative disc disease 03/04/2018    Class: Chronic  . Forestier's disease of thoracolumbar region 03/04/2018    Class: Chronic  . Fusion of spine of thoracolumbar region 03/04/2018  . Pain in right hip 03/18/2017  . Stiffness of joint, not elsewhere classified, other specified site 08/04/2013  . Leg weakness, bilateral 08/04/2013  . CHEST PAIN UNSPECIFIED 05/10/2009  . BENIGN POSITIONAL VERTIGO 07/30/2007  . VARICOSE VEINS, LOWER EXTREMITIES 07/30/2007  . SCIATICA, CHRONIC 07/30/2007  . DIABETES MELLITUS, TYPE II 11/18/2006  . HYPERLIPIDEMIA 11/18/2006  . MIGRAINE HEADACHE 11/18/2006  . LESION, SCIATIC NERVE 11/18/2006  . HYPERTENSION 11/18/2006  . HYPOTENSION, ORTHOSTATIC 11/18/2006  . BRONCHITIS NOS 11/18/2006  . Other emphysema (HCC) 11/18/2006  . ASTHMA 11/18/2006  . OSTEOARTHRITIS 11/18/2006  . LOW BACK PAIN 11/18/2006  . VERTIGO 11/18/2006    Orientation RESPIRATION BLADDER Height & Weight     Self, Time, Situation, Place  Normal Continent, Indwelling catheter Weight:   Height:      BEHAVIORAL SYMPTOMS/MOOD NEUROLOGICAL BOWEL NUTRITION STATUS      Continent Diet(see discharge summary)  AMBULATORY STATUS COMMUNICATION OF NEEDS Skin   Limited Assist Verbally Surgical wounds(incision on back)                       Personal Care Assistance Level of Assistance  Bathing, Feeding, Dressing Bathing Assistance: Maximum assistance Feeding assistance: Independent Dressing Assistance: Limited assistance     Functional Limitations Info  Sight, Hearing, Speech Sight Info: Adequate Hearing Info: Adequate Speech Info: Adequate    SPECIAL CARE FACTORS FREQUENCY  PT (By licensed PT), OT (By licensed OT)     PT Frequency: 5x week OT Frequency: 5x week            Contractures Contractures Info: Not present    Additional Factors Info  Code Status, Allergies Code Status Info: Full Code Allergies Info: No Known Allergies           Current Medications (03/05/2018):  This is the current hospital active medication list Current Facility-Administered Medications  Medication Dose Route Frequency Provider Last Rate Last Dose  . 0.9 %  sodium chloride infusion  250 mL Intravenous Continuous Kerrin Champagne, MD      . 0.9 %  sodium chloride infusion   Intravenous Continuous Kerrin Champagne, MD      . acetaminophen (TYLENOL) tablet 650 mg  650 mg Oral Q4H PRN Kerrin Champagne, MD       Or  . acetaminophen (TYLENOL) suppository 650 mg  650 mg Rectal Q4H PRN Kerrin Champagne, MD      .  alum & mag hydroxide-simeth (MAALOX/MYLANTA) 200-200-20 MG/5ML suspension 30 mL  30 mL Oral Q6H PRN Kerrin Champagne, MD      . amLODipine (NORVASC) tablet 5 mg  5 mg Oral Daily Kerrin Champagne, MD   5 mg at 03/05/18 1203  . aspirin EC tablet 81 mg  81 mg Oral Daily Kerrin Champagne, MD   81 mg at 03/05/18 1021  . bisacodyl (DULCOLAX) EC tablet 5 mg  5 mg Oral Daily PRN Kerrin Champagne, MD      . docusate sodium (COLACE) capsule 100 mg  100 mg Oral BID Kerrin Champagne, MD   100 mg at 03/05/18 1021  .  furosemide (LASIX) tablet 20 mg  20 mg Oral Daily PRN Kerrin Champagne, MD      . gabapentin (NEURONTIN) capsule 300 mg  300 mg Oral TID Kerrin Champagne, MD   300 mg at 03/05/18 1021  . insulin aspart (novoLOG) injection 0-20 Units  0-20 Units Subcutaneous TID WC Kerrin Champagne, MD   4 Units at 03/05/18 0848  . linagliptin (TRADJENTA) tablet 5 mg  5 mg Oral Daily Kerrin Champagne, MD       And  . metFORMIN (GLUCOPHAGE) tablet 1,000 mg  1,000 mg Oral BID WC Kerrin Champagne, MD   1,000 mg at 03/05/18 1000  . losartan (COZAAR) tablet 50 mg  50 mg Oral Daily Kerrin Champagne, MD      . menthol-cetylpyridinium (CEPACOL) lozenge 3 mg  1 lozenge Oral PRN Kerrin Champagne, MD       Or  . phenol (CHLORASEPTIC) mouth spray 1 spray  1 spray Mouth/Throat PRN Kerrin Champagne, MD      . methocarbamol (ROBAXIN) tablet 500 mg  500 mg Oral Q6H PRN Kerrin Champagne, MD   500 mg at 03/04/18 2258   Or  . methocarbamol (ROBAXIN) 500 mg in dextrose 5 % 50 mL IVPB  500 mg Intravenous Q6H PRN Kerrin Champagne, MD      . methocarbamol (ROBAXIN) tablet 500 mg  500 mg Oral Q6H PRN Kerrin Champagne, MD      . morphine 4 MG/ML injection 1 mg  1 mg Intravenous Q2H PRN Kerrin Champagne, MD      . ondansetron [facility]) tablet 4 mg  4 mg Oral Q6H PRN Kerrin Champagne, MD       Or  . ondansetron [facility]) injection 4 mg  4 mg Intravenous Q6H PRN Kerrin Champagne, MD      . oxyCODONE (Oxy IR/ROXICODONE) immediate release tablet 10 mg  10 mg Oral Q3H PRN Kerrin Champagne, MD      . oxyCODONE (Oxy IR/ROXICODONE) immediate release tablet 5 mg  5 mg Oral Q3H PRN Kerrin Champagne, MD   5 mg at 03/04/18 1609  . oxyCODONE (OXYCONTIN) 12 hr tablet 15 mg  15 mg Oral Q12H Kerrin Champagne, MD   15 mg at 03/05/18 1021  . polyethylene glycol (MIRALAX / GLYCOLAX) packet 17 g  17 g Oral Daily PRN Kerrin Champagne, MD      . potassium chloride (K-DUR) CR tablet 10 mEq  10 mEq Oral Daily PRN Kerrin Champagne, MD      . simvastatin (ZOCOR) tablet 20 mg  20 mg Oral QHS Kerrin Champagne, MD   20 mg at 03/04/18 2257  . sodium chloride flush (NS) 0.9 % injection 3 mL  3 mL  Intravenous Q12H Kerrin Champagne, MD      . sodium chloride flush (NS) 0.9 % injection 3 mL  3 mL Intravenous PRN Kerrin Champagne, MD      . sodium phosphate (FLEET) 7-19 GM/118ML enema 1 enema  1 enema Rectal Once PRN Kerrin Champagne, MD      . zolpidem (AMBIEN) tablet 5 mg  5 mg Oral QHS PRN,MR X 1 Kerrin Champagne, MD         Discharge Medications: Please see discharge summary for a list of discharge medications.  Relevant Imaging Results:  Relevant Lab Results:   Additional Information SS# [id number] [place], [place]

## 2018-03-05 NOTE — Evaluation (Signed)
Occupational Therapy Evaluation Patient Details Name: [name] MRN: [id number] DOB: 22-Mar-1951 Today's Date: 03/05/2018    History of Present Illness Pt s/p elective PLIF. PMH: DM and asmtha   Clinical Impression   PTA Pt at home with sister, mod I for ADL and mobility. Pt walking back from bathroom with RN staff when OT entered the room, Pt dizzy again with drop in BP (see vitals). Pt is currently max A for LB ADL and mod A to don /doff brace sitting EOB - Brace education provided in full with teach back and demo. Sisters present throughout session. Pt will benefit from skilled OT in the acute setting and afterwards at the SNF level to maximize safety and independence in ADL and functional transfers. Pt encouraged to sit up to build up BP tolerance. Next session to focus on AE for LB ADL/transfers.     Follow Up Recommendations  SNF;Supervision/Assistance - 24 hour(unless Pt can progress to supervision level)    Equipment Recommendations  None recommended by OT(Pt has appropriate DME)    Recommendations for Other Services       Precautions / Restrictions Precautions Precautions: Fall;Back Precaution Booklet Issued: Yes (comment) Precaution Comments: pt with good understanding Required Braces or Orthoses: Spinal Brace Spinal Brace: Lumbar corset;Applied in sitting position Restrictions Weight Bearing Restrictions: No      Mobility Bed Mobility Overal bed mobility: Needs Assistance Bed Mobility: Rolling;Sit to Sidelying Rolling: Min guard       Sit to sidelying: Min assist General bed mobility comments: done in collaboration with RN staff; directional v/c's, bed rail used, minA for LE management back into bed  Transfers Overall transfer level: Needs assistance Equipment used: Rolling walker (2 wheeled) Transfers: Sit to/from Stand Sit to Stand: Min assist         General transfer comment: vc for safety with RW standing>sitting observed this session     Balance Overall balance assessment: Needs assistance Sitting-balance support: Feet supported;Bilateral upper extremity supported Sitting balance-Leahy Scale: Poor Sitting balance - Comments: posterior lean   Standing balance support: Bilateral upper extremity supported Standing balance-Leahy Scale: Poor Standing balance comment: dependent on RW                           ADL either performed or assessed with clinical judgement   ADL Overall ADL's : Needs assistance/impaired Eating/Feeding: Modified independent;Sitting   Grooming: Wash/dry hands;Wash/dry face;Set up   Upper Body Bathing: Moderate assistance   Lower Body Bathing: Maximal assistance   Upper Body Dressing : Moderate assistance;Cueing for sequencing;Cueing for safety;Sitting Upper Body Dressing Details (indicate cue type and reason): to don brace. full education provided Lower Body Dressing: Maximal assistance   Toilet Transfer: Minimal assistance;+2 for safety/equipment;Ambulation;RW;Comfort height toilet;Grab bars Toilet Transfer Details (indicate cue type and reason): Pt has just ambulated with RN's from bathroom as OT entered - Pt's BP still slightly orthostatic (please see vitals) Toileting- Clothing Manipulation and Hygiene: Maximal assistance;Sit to/from stand       Functional mobility during ADLs: Minimal assistance;+2 for safety/equipment;Rolling walker General ADL Comments: Pt less symptomatic, but orthostatic with positional changes     Vision Baseline Vision/History: Wears glasses Wears Glasses: Reading only Patient Visual Report: No change from baseline Vision Assessment?: No apparent visual deficits     Perception     Praxis      Pertinent Vitals/Pain Pain Assessment: 0-10 Pain Score: 5  Pain Location: surgical sight Pain Descriptors / Indicators: Sore  Pain Intervention(s): Monitored during session;Limited activity within patient's tolerance;Repositioned     Hand Dominance  Right   Extremity/Trunk Assessment Upper Extremity Assessment Upper Extremity Assessment: Generalized weakness   Lower Extremity Assessment Lower Extremity Assessment: Defer to PT evaluation   Cervical / Trunk Assessment Cervical / Trunk Assessment: Other exceptions Cervical / Trunk Exceptions: back surgery   Communication Communication Communication: No difficulties   Cognition Arousal/Alertness: Awake/alert Behavior During Therapy: Flat affect Overall Cognitive Status: Within Functional Limits for tasks assessed                                 General Comments: pt appropriate mildly slow to respond   General Comments  2 sisters and cousin present for session, brace in room and education provided for entire party. Also education for compensatory strategies for ADL provided.     Exercises     Shoulder Instructions      Home Living Family/patient expects to be discharged to:: Private residence Living Arrangements: Other relatives(sister) Available Help at Discharge: Family;Available 24 hours/day(but can't physically assist) Type of Home: House Home Access: Ramped entrance;Stairs to enter Entrance Stairs-Number of Steps: 4 Entrance Stairs-Rails: Can reach both Home Layout: One level     Bathroom Shower/Tub: Chief Strategy Officer: Handicapped height Bathroom Accessibility: Yes How Accessible: Accessible via walker Home Equipment: Emergency planning/management officer - 2 wheels;Cane - single point          Prior Functioning/Environment Level of Independence: Independent with assistive device(s)        Comments: was driving, used cane and walker        OT Problem List: Decreased range of motion;Decreased activity tolerance;Impaired balance (sitting and/or standing);Decreased knowledge of use of DME or AE;Decreased knowledge of precautions;Pain      OT Treatment/Interventions: Self-care/ADL training;DME and/or AE instruction;Therapeutic  activities;Patient/family education;Balance training    OT Goals(Current goals can be found in the care plan section) Acute Rehab OT Goals Patient Stated Goal: get better OT Goal Formulation: With patient/family Time For Goal Achievement: 03/19/18 Potential to Achieve Goals: Good ADL Goals Pt Will Perform Grooming: with modified independence;sitting Pt Will Perform Lower Body Bathing: with modified independence;with adaptive equipment;sitting/lateral leans Pt Will Perform Lower Body Dressing: sit to/from stand;with adaptive equipment;with caregiver independent in assisting;with min guard assist Pt Will Transfer to Toilet: with supervision;ambulating Pt Will Perform Toileting - Clothing Manipulation and hygiene: with supervision;with adaptive equipment;sit to/from stand Pt Will Perform Tub/Shower Transfer: Tub transfer;with supervision;with caregiver independent in assisting;shower seat;rolling walker Additional ADL Goal #1: Pt will recall and maintain 3/3 back precautions throughout ADL activities at independent level  OT Frequency: Min 2X/week   Barriers to D/C:            Co-evaluation              AM-PAC PT "6 Clicks" Daily Activity     Outcome Measure Help from another person eating meals?: A Little Help from another person taking care of personal grooming?: A Little Help from another person toileting, which includes using toliet, bedpan, or urinal?: A Lot Help from another person bathing (including washing, rinsing, drying)?: A Lot Help from another person to put on and taking off regular upper body clothing?: A Lot Help from another person to put on and taking off regular lower body clothing?: A Lot 6 Click Score: 14   End of Session Equipment Utilized During Treatment: Gait belt;Rolling walker;Back brace Nurse  Communication: Mobility status;Precautions  Activity Tolerance: Other (comment)(dizziness/orthostatic) Patient left: in bed;with call bell/phone within  reach;with family/visitor present;with SCD's reapplied  OT Visit Diagnosis: Unsteadiness on feet (R26.81);Other abnormalities of gait and mobility (R26.89);Pain Pain - Right/Left: (central) Pain - part of body: (back)                Time: 1610-9604 OT Time Calculation (min): 21 min Charges:  OT General Charges $OT Visit: 1 Visit OT Evaluation $OT Eval Moderate Complexity: 1 Mod G-Codes:     Sherryl Manges OTR/L [phone]  [name] 03/05/2018, 5:17 PM

## 2018-03-05 NOTE — Progress Notes (Signed)
Pt 02 sat maintaining 86-89%. Put on 2l [place] and 02 sat increased to 96%., will cont to monitr.

## 2018-03-05 NOTE — Evaluation (Signed)
Physical Therapy Evaluation Patient Details Name: [name] MRN: [id number] DOB: 10/15/1951 Today's Date: 03/05/2018   History of Present Illness  Pt s/p elective PLIF. PMH: DM and asmtha  Clinical Impression  Patient is s/p above surgery resulting in the deficits listed below (see PT Problem List). Pt BP dropped to 84/50 and was symptomatic requiring pt to return to bed. RN present. Called [employer] to order brace.  Patient will benefit from skilled PT to increase their independence and safety with mobility (while adhering to their precautions) to allow discharge to the venue listed below.      Follow Up Recommendations SNF;Supervision/Assistance - 24 hour(unless progresses to supervision level of mobility)    Equipment Recommendations  None recommended by PT    Recommendations for Other Services       Precautions / Restrictions Precautions Precautions: Fall;Back Precaution Booklet Issued: Yes (comment) Precaution Comments: pt with good understanding Required Braces or Orthoses: Spinal Brace Spinal Brace: Lumbar corset;Applied in sitting position Restrictions Weight Bearing Restrictions: No      Mobility  Bed Mobility Overal bed mobility: Needs Assistance Bed Mobility: Rolling;Sidelying to Sit;Sit to Sidelying Rolling: Min guard Sidelying to sit: Min assist     Sit to sidelying: Min assist General bed mobility comments: directional v/c's, min A for trunk elevation, bed rail used, minA for LE management back into bed  Transfers Overall transfer level: Needs assistance Equipment used: Rolling walker (2 wheeled) Transfers: Sit to/from Stand Sit to Stand: Min assist         General transfer comment: pt with resting posterior lean, v/c's to used abdomen to pull self forward to neutral. minA to power up and steady during transition of hands   Ambulation/Gait Ambulation/Gait assistance: Min assist Ambulation Distance (Feet): 10 Feet(to bathroom) Assistive  device: Rolling walker (2 wheeled) Gait Pattern/deviations: Step-through pattern;Decreased stride length;Narrow base of support Gait velocity: slow Gait velocity interpretation: <1.31 ft/sec, indicative of household ambulator General Gait Details: pt mildly unsteady, pt with onset of dizziness. minA to prevent posterior lean and to assist with walker management  Stairs            Wheelchair Mobility    Modified Rankin (Stroke Patients Only)       Balance Overall balance assessment: Needs assistance Sitting-balance support: Feet supported;Bilateral upper extremity supported Sitting balance-Leahy Scale: Poor Sitting balance - Comments: posterior lean   Standing balance support: Bilateral upper extremity supported Standing balance-Leahy Scale: Poor Standing balance comment: dependent on RW                             Pertinent Vitals/Pain Pain Assessment: 0-10 Pain Score: 3  Pain Location: surgical sight Pain Descriptors / Indicators: Sore Pain Intervention(s): Monitored during session    Home Living Family/patient expects to be discharged to:: Private residence Living Arrangements: Other relatives(sister) Available Help at Discharge: Family;Available 24 hours/day(but can't physically assist) Type of Home: House Home Access: Ramped entrance;Stairs to enter Entrance Stairs-Rails: Can reach both Entrance Stairs-Number of Steps: 4 Home Layout: One level Home Equipment: Emergency planning/management officer - 2 wheels;Cane - single point      Prior Function Level of Independence: Independent with assistive device(s)         Comments: was driving, used cane and walker     Hand Dominance   Dominant Hand: Right    Extremity/Trunk Assessment   Upper Extremity Assessment Upper Extremity Assessment: Generalized weakness    Lower Extremity Assessment Lower  Extremity Assessment: Generalized weakness    Cervical / Trunk Assessment Cervical / Trunk Assessment: Other  exceptions Cervical / Trunk Exceptions: back surgery  Communication   Communication: No difficulties  Cognition Arousal/Alertness: Awake/alert Behavior During Therapy: Flat affect Overall Cognitive Status: Within Functional Limits for tasks assessed                                 General Comments: pt appropriate mildly slow to respond      General Comments General comments (skin integrity, edema, etc.): pt assisted to the bathroom, unable to urinate. Also called [employer] to order patients brace. Pt also reported worsening dizziness upon walk back to bed requiring pt to sit in chair. Pt reports "I think my sugar is low". BP taken at 84/50 and blood sugar 197.     Exercises     Assessment/Plan    PT Assessment Patient needs continued PT services  PT Problem List Decreased strength;Decreased activity tolerance;Decreased balance;Decreased mobility;Decreased coordination;Decreased cognition;Decreased knowledge of use of DME       PT Treatment Interventions DME instruction;Gait training;Stair training;Functional mobility training;Therapeutic activities;Therapeutic exercise;Balance training    PT Goals (Current goals can be found in the Care Plan section)  Acute Rehab PT Goals Patient Stated Goal: get better PT Goal Formulation: With patient Time For Goal Achievement: 03/19/18 Potential to Achieve Goals: Good    Frequency Min 5X/week   Barriers to discharge Decreased caregiver support sister can not provide physical support    Co-evaluation               AM-PAC PT "6 Clicks" Daily Activity  Outcome Measure Difficulty turning over in bed (including adjusting bedclothes, sheets and blankets)?: Unable Difficulty moving from lying on back to sitting on the side of the bed? : Unable Difficulty sitting down on and standing up from a chair with arms (e.g., wheelchair, bedside commode, etc,.)?: Unable Help needed moving to and from a bed to chair (including a  wheelchair)?: A Lot Help needed walking in hospital room?: A Lot Help needed climbing 3-5 steps with a railing? : A Lot 6 Click Score: 9    End of Session Equipment Utilized During Treatment: Gait belt Activity Tolerance: Treatment limited secondary to medical complications (Comment) Patient left: in bed;with call bell/phone within reach;with nursing/sitter in room Nurse Communication: Mobility status(in room to address dizziness, low BP) PT Visit Diagnosis: Unsteadiness on feet (R26.81)    Time: 1610-9604 PT Time Calculation (min) (ACUTE ONLY): 31 min   Charges:   PT Evaluation $PT Eval Moderate Complexity: 1 Mod PT Treatments $Gait Training: 8-22 mins   PT G Codes:        Lewis Shock, PT, DPT Pager #: [phone] Office #: [phone]   Baby Gieger M Anel Creighton 03/05/2018, 8:30 AM

## 2018-03-05 NOTE — Progress Notes (Signed)
Orthopedic Tech Progress Note Patient Details:  [name] 08-31-1951 [id number]  Patient ID: [name], female   DOB: 09-18-1951, 67 y.o.   MRN: [id number]   Nikki Dom 03/05/2018, 9:13 AM Called in [employer] brace order; spoke with Wylene Men

## 2018-03-05 NOTE — Social Work (Signed)
CSW presented pt with offers, pt friends were at bedside visiting. CSW will f.u with pt to get top choice. SNF authorization through [facility] often takes 48-72 hours.   Doy Hutching, LCSWA [facility] Clinical Social Work [phone]

## 2018-03-05 NOTE — Progress Notes (Signed)
Inpatient Diabetes Program Recommendations  AACE/ADA: New Consensus Statement on Inpatient Glycemic Control (2019)  Target Ranges:  Prepandial:   less than 140 mg/dL      Peak postprandial:   less than 180 mg/dL (1-2 hours)      Critically ill patients:  140 - 180 mg/dL   Results for ESTLE, SABELLA (MRN [id number]) as of 03/05/2018 09:13  Ref. Range 03/04/2018 05:56 03/04/2018 15:46 03/04/2018 17:59 03/04/2018 20:21 03/04/2018 22:16 03/05/2018 08:11  Glucose-Capillary Latest Ref Range: 65 - 99 mg/dL 132 (H) 440 (H) 102 (H) 219 (H) 195 (H) 197 (H)   Review of Glycemic Control  Diabetes history: DM2 Outpatient Diabetes medications: 70/30 66 units BID, Alogliptin-Metformin 12.02-999 mg BID Current orders for Inpatient glycemic control: Novolog 0-20 units TID with meals, Metformin 1000 mg BID, Tradjenta 5 mg daily  Inpatient Diabetes Program Recommendations:  Oral DM medications: While inpatient, please discontinue oral DM medications. Insulin - Basal: Please consider ordering Lantus 10 units daily. Correction (SSI): Please consider ordering Novolog 0-5 units QHS for bedtime correction. HgbA1C: A1C 6.4% on 03/04/18 indicating an average glucose of 137 mg/dl over the past 2-3 months. Recommend slightly decreasing outpatient evening dose of 70/30 to 60 units QPM with supper (continue 70/30 66 units QAM with breakfast) based on patient report of frequently waking up with glucose in the 60's mg/dl.  NOTE: Spoke with patient about diabetes and home regimen for diabetes control. Patient reports that she is followed by PCP for diabetes management and currently she takes 70/30 66 units BID and Alogliptin-Metformin 12.02-999 mg BID as an outpatient for diabetes control. Patient reports that he is taking DM medications as prescribed. Patient reports that she sees her PCP every 3 months and her last A1C was 6.1%. Patient states that she checks her glucose 2-3 times per day and that it is usually in the 60's  mg/dl in the morning when she wakes up and in the 90's mg/dl later in the day.  Patient reports that she wakes up with glucose in the 60's at least every other day.  Expressed concern that fasting glucose is too low which is dangerous. Explained that she may need to talk with the doctor about decreasing insulin dosages if glucose is less than 70 mg/dl.  Current A1C results (6.4% on 03/04/18) and explained that his current A1C indicates an average glucose of 137 mg/dl over the past 2-3 months. Discussed glucose and A1C goals.  Discussed impact of nutrition, exercise, stress, sickness, and medications on diabetes control. Informed patient that she received Decadron 10 mg x 1 on 03/04/18 which is contributing to hyperglycemia. Inquired about diet and patient admits that she does not follow a carb modified diet. Patient does drink mostly water but does not limit carbohydrate intake.  Discussed carbohydrates, carbohydrate goals per day and meal, along with portion sizes. Patient states that she understands carb modified diet but she just has not been following it. Encouraged patient to check her glucose 3-4 times per day (before meals and at bedtime) and to keep a log book of glucose readings and insulin taken which she will need to take to doctor appointments. Encouraged patient to reach out to her PCP if her glucose is less than 70 mg/dl so she can receive recommendations for insulin changes. Informed patient that while she is in the hospital, it will be recommended to use Lantus and Novolog insulin for glycemic control. Patient verbalized understanding of information discussed and she states that she has no further  questions at this time related to diabetes.  Thanks, Orlando Penner, RN, MSN, CDE Diabetes Coordinator Inpatient Diabetes Program [phone] (Team Pager)

## 2018-03-05 NOTE — Anesthesia Postprocedure Evaluation (Signed)
Anesthesia Post Note  Patient: [name]  Procedure(s) Performed: Right transforaminal lumbar interbody fusion L1-2, L2-3 Posterior fusion T10, T11, T12, L1, L2 with T 11, T12 pedicle screws, superior sublaminar hooks T10, local bone graft, allograft cancellous chips Vivigen (N/A Spine Lumbar)     Patient location during evaluation: PACU Anesthesia Type: General Level of consciousness: awake and alert Pain management: pain level controlled Vital Signs Assessment: post-procedure vital signs reviewed and stable Respiratory status: spontaneous breathing, nonlabored ventilation, respiratory function stable and patient connected to nasal cannula oxygen Cardiovascular status: blood pressure returned to baseline and stable Postop Assessment: no apparent nausea or vomiting Anesthetic complications: no    Last Vitals:  Vitals:   03/05/18 1608 03/05/18 1955  BP: 102/61   Pulse: (!) 111   Resp: 14   Temp: 37.4 C (!) 38.3 C  SpO2: 93%     Last Pain:  Vitals:   03/05/18 2029  TempSrc:   PainSc: 5    Pain Goal: Patients Stated Pain Goal: 5 (03/04/18 1600)               Mylee Falin S

## 2018-03-05 NOTE — Clinical Social Work Note (Signed)
Clinical Social Work Assessment  Patient Details  Name: [name] MRN: [id number] Date of Birth: [birth year]  Date of referral:  03/05/18               Reason for consult:  Facility Placement, Discharge Planning                Permission sought to share information with:  Facility Medical sales representative, Family Supports Permission granted to share information::  Yes, Verbal Permission Granted  Name::     [name]  Agency::  SNFs near [place]  Relationship::  sister  Contact Information:    [phone]  Housing/Transportation Living arrangements for the past 2 months:  Single Family Home Source of Information:  Patient Patient Interpreter Needed:  None Criminal Activity/Legal Involvement Pertinent to Current Situation/Hospitalization:  No - Comment as needed Significant Relationships:  Merchandiser, retail, Other Family Members, Siblings Lives with:  Siblings Do you feel safe going back to the place where you live?  Yes Need for family participation in patient care:  Yes (Comment)  Care giving concerns:  Pt lives with her sister, but she is unable to provide 24/7 assistance. Pt has family in [place] but states they are disabled and unable to travel to help her. Pt agreeable to SNF, care team want PT to continue to work with therapies prior to discharge.    Social Worker assessment / plan:  CSW spoke with pt at bedside, she states she lives with her sister in [place]), but her sister is unable to provide significant assist at home. Pt states her sister has been at [facility] before, CSW explained this is not currently recommended, but SNF is. Pt aware and amenable to see her options, knows they also may be limited by insurance coverage [facility]). Pt would like to be as close to [place] as possible in order to be near family that can visit.   CSW will bring pt options for consideration and to initiate [employer] authorization as there is often a  longer period of time for authorization.   Employment status:  Retired Database administrator PT Recommendations:  24 Hour Supervision Information / Referral to community resources:  Skilled Nursing Facility  Patient/Family's Response to care: Pt amenable to SNF process, understands CSW role and was appreciative for visit.   Patient/Family's Understanding of and Emotional Response to Diagnosis, Current Treatment, and Prognosis:  Pt states understanding of diagnosis, current treatment and prognosis. Is amenable to SNF, would like to work with therapies again as her BP prevented a full session this morning. Pt aware of limitations and expresses reasonable understanding that the ability of her family to help is limited in some areas.   Emotional Assessment Appearance:  Appears stated age Attitude/Demeanor/Rapport:  Gracious, Engaged Affect (typically observed):  Accepting, Adaptable, Appropriate, Pleasant Orientation:  Oriented to Self, Oriented to Place, Oriented to  Time, Oriented to Situation Alcohol / Substance use:  Not Applicable Psych involvement (Current and /or in the community):  No (Comment)  Discharge Needs  Concerns to be addressed:  Discharge Planning Concerns, Care Coordination Readmission within the last 30 days:  No Current discharge risk:  Physical Impairment Barriers to Discharge:  English as a second language teacher, Continued Medical Work up   [employer], LCSWA 03/05/2018, 11:08 AM

## 2018-03-05 NOTE — Progress Notes (Signed)
Patient ID: GERICA KOBLE, female   DOB: [birth year], 67 y.o.   MRN: [id number] I will be by to check on this patient this afternoon. Low blood pressure. Hgb is 9.6 and creatinine 1.01 Hold norvasc for SBP <120 and DBP<75 Should hod today. Contnue with lasix. Will likely discontinue hemovac this afternoon. PT and OT today with or without brace.

## 2018-03-05 NOTE — Progress Notes (Signed)
     Subjective: 1 Day Post-Op Procedure(s) (LRB): Right transforaminal lumbar interbody fusion L1-2, L2-3 Posterior fusion T10, T11, T12, L1, L2 with T 11, T12 pedicle screws, superior sublaminar hooks T10, local bone graft, allograft cancellous chips Vivigen (N/A) Awake, alert and sitting up in bed. Attempted PT but felt lightheaded and PT was discontinued. Brace is in room to be used wthen out of bed. Foley removed this AM, no void as yet.   Patient reports pain as moderate.    Objective:   VITALS:  Temp:  [97.6 F (36.4 C)-99.7 F (37.6 C)] 98.7 F (37.1 C) (05/22 1139) Pulse Rate:  [79-116] 107 (05/22 1139) Resp:  [12-22] 16 (05/21 1630) BP: (84-137)/(50-98) 137/98 (05/22 1202) SpO2:  [92 %-100 %] 95 % (05/22 1139) Arterial Line BP: (92-123)/(45-55) 123/55 (05/21 1630)  Neurologically intact ABD soft Neurovascular intact Sensation intact distally Intact pulses distally Dorsiflexion/Plantar flexion intact Hemovac in less than 24 hours, will keep as she has about 60-80cc in reservoir.    LABS Recent Labs    03/04/18 1032 03/04/18 1237 03/04/18 1352 03/04/18 1446 03/05/18 0218  HGB 11.9* 11.6* 11.6* 10.9* 9.6*  WBC  --   --   --   --  14.2*  PLT  --   --   --   --  157   Recent Labs    03/04/18 1446 03/05/18 0218  NA 139 136  K 4.3 4.3  CL  --  103  CO2  --  25  BUN  --  16  CREATININE  --  1.01*  GLUCOSE 220* 219*   No results for input(s): LABPT, INR in the last 72 hours.   Assessment/Plan: 1 Day Post-Op Procedure(s) (LRB): Right transforaminal lumbar interbody fusion L1-2, L2-3 Posterior fusion T10, T11, T12, L1, L2 with T 11, T12 pedicle screws, superior sublaminar hooks T10, local bone graft, allograft cancellous chips Vivigen (N/A)  Advance diet Up with therapy D/C IV fluids  Will bolus with NS and restart IVF at 125 cc per hour for 6-8 hour. Adequate pain control. Brace when up. In and out cath for urinary retention.   [name] 03/05/2018, 2:01 PMPatient ID: [name], female   DOB: 1951/06/01, 67 y.o.   MRN: [id number]

## 2018-03-06 ENCOUNTER — Telehealth (INDEPENDENT_AMBULATORY_CARE_PROVIDER_SITE_OTHER): Payer: Self-pay

## 2018-03-06 LAB — GLUCOSE, CAPILLARY
GLUCOSE-CAPILLARY: 144 mg/dL — AB (ref 65–99)
GLUCOSE-CAPILLARY: 155 mg/dL — AB (ref 65–99)
GLUCOSE-CAPILLARY: 141 mg/dL — AB (ref 65–99)
Glucose-Capillary: 164 mg/dL — ABNORMAL HIGH (ref 65–99)
Glucose-Capillary: 166 mg/dL — ABNORMAL HIGH (ref 65–99)

## 2018-03-06 LAB — BASIC METABOLIC PANEL
Anion gap: 10 (ref 5–15)
BUN: 11 mg/dL (ref 6–20)
CALCIUM: 7.9 mg/dL — AB (ref 8.9–10.3)
CO2: 23 mmol/L (ref 22–32)
CREATININE: 0.79 mg/dL (ref 0.44–1.00)
Chloride: 104 mmol/L (ref 101–111)
GFR calc Af Amer: >60 mL/min (ref >60)
Glucose, Bld: 165 mg/dL — ABNORMAL HIGH (ref 65–99)
Potassium: 4.3 mmol/L (ref 3.5–5.1)
SODIUM: 137 mmol/L (ref 135–145)

## 2018-03-06 LAB — CBC WITH DIFFERENTIAL/PLATELET
Abs Immature Granulocytes: 0.1 10*3/uL (ref 0.0–0.1)
BASOS ABS: 0 10*3/uL (ref 0.0–0.1)
BASOS PCT: 0 %
EOS ABS: 0.4 10*3/uL (ref 0.0–0.7)
EOS PCT: 2 %
HCT: 29.5 % — ABNORMAL LOW (ref 36.0–46.0)
Hemoglobin: 9.2 g/dL — ABNORMAL LOW (ref 12.0–15.0)
Immature Granulocytes: 1 %
Lymphocytes Relative: 18 %
Lymphs Abs: 3.4 10*3/uL (ref 0.7–4.0)
MCH: 30.6 pg (ref 26.0–34.0)
MCHC: 31.2 g/dL (ref 30.0–36.0)
MCV: 98 fL (ref 78.0–100.0)
MONO ABS: 1.2 10*3/uL — AB (ref 0.1–1.0)
Monocytes Relative: 6 %
NEUTROS ABS: 13.3 10*3/uL — AB (ref 1.7–7.7)
Neutrophils Relative %: 73 %
PLATELETS: 172 10*3/uL (ref 150–400)
RBC: 3.01 MIL/uL — ABNORMAL LOW (ref 3.87–5.11)
RDW: 13 % (ref 11.5–15.5)
WBC: 18.4 10*3/uL — ABNORMAL HIGH (ref 4.0–10.5)

## 2018-03-06 NOTE — Progress Notes (Signed)
     Subjective: 2 Days Post-Op Procedure(s) (LRB): Right transforaminal lumbar interbody fusion L1-2, L2-3 Posterior fusion T10, T11, T12, L1, L2 with T 11, T12 pedicle screws, superior sublaminar hooks T10, local bone graft, allograft cancellous chips Vivigen (N/A) Awake, alert and oriented x 4. Low grade fever this AM with decreased BP. Holding BP meds for parameters SBO<120 and DBP,75  Patient reports pain as moderate.    Objective:   VITALS:  Temp:  [98.5 F (36.9 C)-100.9 F (38.3 C)] 98.5 F (36.9 C) (05/23 0845) Pulse Rate:  [94-111] 95 (05/23 0845) Resp:  [14-17] 15 (05/23 0415) BP: (90-137)/(50-98) 113/53 (05/23 0845) SpO2:  [89 %-95 %] 91 % (05/23 0845)  Neurologically intact ABD soft Neurovascular intact Sensation intact distally Intact pulses distally Dorsiflexion/Plantar flexion intact Incision: dressing C/D/I and no drainage No cellulitis present Hemovac removed.    LABS Recent Labs    03/04/18 1237 03/04/18 1352 03/04/18 1446 03/05/18 0218 03/05/18 1802  HGB 11.6* 11.6* 10.9* 9.6* 9.3*  WBC  --   --   --  14.2* 14.6*  PLT  --   --   --  157 152   Recent Labs    03/04/18 1446 03/05/18 0218  NA 139 136  K 4.3 4.3  CL  --  103  CO2  --  25  BUN  --  16  CREATININE  --  1.01*  GLUCOSE 220* 219*   No results for input(s): LABPT, INR in the last 72 hours.   Assessment/Plan: 2 Days Post-Op Procedure(s) (LRB): Right transforaminal lumbar interbody fusion L1-2, L2-3 Posterior fusion T10, T11, T12, L1, L2 with T 11, T12 pedicle screws, superior sublaminar hooks T10, local bone graft, allograft cancellous chips Vivigen (N/A)  Advance diet Up with therapy  Pulmonary toliet as incresed episodic temps are probably secondary to atelectasis.   [name] 03/06/2018, 9:01 AMPatient ID: [name], female   DOB: 04/10/1951, 67 y.o.   MRN: [id number]

## 2018-03-06 NOTE — Social Work (Signed)
CSW f/u with [facility], they are unable to offer bed due to capacity and other offers extended. Pt and pt sister indicated yesterday that if no beds at [facility] they would like to accept offer at [facility].   [employer] Berkley Harvey has been initated and is pending.   CSW continuing to follow.  Doy Hutching, LCSWA [facility] Clinical Social Work [phone]

## 2018-03-06 NOTE — Telephone Encounter (Signed)
This message was from the peer-to-peer call earlier today (12:30 pm):  This case was reviewed by the neurosurgeon on staff at [facility] and then given to this Dr. Lanell Matar to relay the information to Dr. Otelia Sergeant.  The lumbar surgery was denied because:  #1 - the MRI report did not not moderate to severe stenosis ---- a radiology addendum would be needed #2 - there is no documented note from a physical therapist or internist showing the patient has had at least 6 weeks of physical therapy in the past year #3 - no documentation of the radiculopathy restricting the patient's ADLs #4 Vivigen is not covered by [employer], period.  She said that if at least the first 2 can be remedied, then the info can be resubmitted to [facility] by appeal (better if all 3 can be remedied).   Dr. Lanell Matar did not require a call back, since these are the things Monia Pouch must have to approve the surgery (no discussion will help).

## 2018-03-06 NOTE — Progress Notes (Signed)
Physical Therapy Treatment Patient Details Name: [name] MRN: [id number] DOB: 1951/08/12 Today's Date: 03/06/2018    History of Present Illness Pt s/p elective PLIF. PMH: DM and asmtha    PT Comments    Patient progressing with ambulation distance, but very slowly and somewhat symptomatic with some drop in BP and SpO2 on RA.  Continue to feel she is appropriate for SNF level rehab at d/c.   Follow Up Recommendations  SNF;Supervision/Assistance - 24 hour     Equipment Recommendations  None recommended by PT    Recommendations for Other Services       Precautions / Restrictions Precautions Precautions: Fall;Back Required Braces or Orthoses: Spinal Brace Spinal Brace: Applied in sitting position;Thoracolumbosacral orthotic    Mobility  Bed Mobility Overal bed mobility: Needs Assistance Bed Mobility: Rolling;Sit to Sidelying Rolling: Min guard Sidelying to sit: Min assist       General bed mobility comments: able to demonstrate technique with min cues  Transfers Overall transfer level: Needs assistance Equipment used: Rolling walker (2 wheeled) Transfers: Sit to/from Stand Sit to Stand: Mod assist;Max assist         General transfer comment: cues for hand placement, assist for lifting and lowering  Ambulation/Gait Ambulation/Gait assistance: Min assist Ambulation Distance (Feet): 60 Feet Assistive device: Rolling walker (2 wheeled) Gait Pattern/deviations: Step-to pattern;Decreased stance time - right;Antalgic     General Gait Details: very very slow to ambulate and hesitant for R weight bearing reporting some sciatic symptoms, assist to manage walker and cues throughout for continuing and making sure she felt okay,   Stairs             Wheelchair Mobility    Modified Rankin (Stroke Patients Only)       Balance Overall balance assessment: Needs assistance   Sitting balance-Leahy Scale: Poor Sitting balance - Comments: falls back  and to L   Standing balance support: Bilateral upper extremity supported Standing balance-Leahy Scale: Poor Standing balance comment: dependent on RW                            Cognition Arousal/Alertness: Awake/alert Behavior During Therapy: Flat affect Overall Cognitive Status: Within Functional Limits for tasks assessed                                 General Comments: pt appropriate mildly slow to respond      Exercises      General Comments General comments (skin integrity, edema, etc.): BP's for orthostatics stable; but lower after ambulation and SpO2 88% on RA with O2 reapplied      Pertinent Vitals/Pain Pain Score: 5  Pain Location: surgical site Pain Descriptors / Indicators: Sore Pain Intervention(s): Repositioned;Monitored during session    Home Living                      Prior Function            PT Goals (current goals can now be found in the care plan section) Progress towards PT goals: Progressing toward goals    Frequency    Min 5X/week      PT Plan Current plan remains appropriate    Co-evaluation              AM-PAC PT "6 Clicks" Daily Activity  Outcome Measure  Difficulty turning over in bed (including  adjusting bedclothes, sheets and blankets)?: Unable Difficulty moving from lying on back to sitting on the side of the bed? : Unable Difficulty sitting down on and standing up from a chair with arms (e.g., wheelchair, bedside commode, etc,.)?: Unable Help needed moving to and from a bed to chair (including a wheelchair)?: A Lot Help needed walking in hospital room?: A Little Help needed climbing 3-5 steps with a railing? : A Lot 6 Click Score: 10    End of Session Equipment Utilized During Treatment: Back brace Activity Tolerance: Patient limited by fatigue Patient left: with call bell/phone within reach;in chair;with family/visitor present   PT Visit Diagnosis: Unsteadiness on feet  (R26.81);Other abnormalities of gait and mobility (R26.89)     Time: 2956-2130 PT Time Calculation (min) (ACUTE ONLY): 29 min  Charges:  $Gait Training: 8-22 mins $Therapeutic Activity: 8-22 mins                    G CodesSheran Lawless, [place] [phone] 03/06/2018    Elray Mcgregor 03/06/2018, 5:47 PM

## 2018-03-06 NOTE — Progress Notes (Signed)
15:00 notified lab that new order for clotted cbc with diff not yet orders and completed. Informed they would reorder the lab now.

## 2018-03-07 ENCOUNTER — Encounter (HOSPITAL_COMMUNITY): Payer: Self-pay | Admitting: Specialist

## 2018-03-07 DIAGNOSIS — D62 Acute posthemorrhagic anemia: Secondary | ICD-10-CM | POA: Diagnosis not present

## 2018-03-07 LAB — GLUCOSE, CAPILLARY
GLUCOSE-CAPILLARY: 161 mg/dL — AB (ref 65–99)
Glucose-Capillary: 143 mg/dL — ABNORMAL HIGH (ref 65–99)
Glucose-Capillary: 191 mg/dL — ABNORMAL HIGH (ref 65–99)
Glucose-Capillary: 117 mg/dL — ABNORMAL HIGH (ref 65–99)

## 2018-03-07 MED ORDER — FERROUS GLUCONATE 324 (38 FE) MG PO TABS
324.0000 mg | ORAL_TABLET | Freq: Three times a day (TID) | ORAL | Status: DC
  Administered 2018-03-07 – 2018-03-08 (×4): 324 mg via ORAL
  Filled 2018-03-07 (×5): qty 1

## 2018-03-07 MED FILL — Sodium Chloride Irrigation Soln 0.9%: Qty: 3000 | Status: AC

## 2018-03-07 MED FILL — Sodium Chloride IV Soln 0.9%: INTRAVENOUS | Qty: 1000 | Status: AC

## 2018-03-07 MED FILL — Heparin Sodium (Porcine) Inj 1000 Unit/ML: INTRAMUSCULAR | Qty: 30 | Status: AC

## 2018-03-07 NOTE — Care Management Note (Signed)
Case Management Note  Patient Details  Name: [name] MRN: [id number] Date of Birth: Nov 08, 1950  Subjective/Objective: 67 y.o. female s/p elective PLIF T10-L2 on 03/04/18.  PTA, pt resided at home with sister, and was independent with assistive devices.                    Action/Plan: Sister unable to physically assist at dc; pt will need SNF at dc.  CSW following for SNF upon medical stability.  Expected Discharge Date:                  Expected Discharge Plan:  Skilled Nursing Facility  In-House Referral:  Clinical Social Work  Discharge planning Services  CM Consult  Post Acute Care Choice:    Choice offered to:     DME Arranged:    DME Agency:     HH Arranged:    HH Agency:     Status of Service:  In process, will continue to follow  If discussed at Long Length of Stay Meetings, dates discussed:    Additional Comments:  Quintella Baton, RN, BSN  Trauma/Neuro ICU Case Manager [phone]

## 2018-03-07 NOTE — Social Work (Addendum)
Continue to await insurance authorization through [employer]. Pt has elected for discharge to [facility], await updates from SNF in regards to [place].    12:12pm- CSW has received insurance authorization. Pt able to be discharged to SNF at [facility].   Doy Hutching, LCSWA [facility] Clinical Social Work [phone]

## 2018-03-07 NOTE — Progress Notes (Signed)
Physical Therapy Treatment Patient Details Name: [name] MRN: [id number] DOB: Aug 13, 1951 Today's Date: 03/07/2018    History of Present Illness 67 y.o. female s/p elective PLIF T10-L2 on 03/04/18. PMH: DM and asmtha    PT Comments    Pt is very slow to move, and requires quite a bit of assistance to stand from the chair.  She is due to d/c to SNF for rehab soon.  PT to follow acutely until d/c confirmed.     Follow Up Recommendations  SNF;Supervision/Assistance - 24 hour     Equipment Recommendations  None recommended by PT    Recommendations for Other Services   NA     Precautions / Restrictions Precautions Precautions: Fall;Back Required Braces or Orthoses: Spinal Brace Spinal Brace: Applied in sitting position;Thoracolumbosacral orthotic    Mobility  Bed Mobility               General bed mobility comments: Pt was OOB in the recliner chair with TLSO already donned  Transfers Overall transfer level: Needs assistance Equipment used: Rolling walker (2 wheeled) Transfers: Sit to/from Stand Sit to Stand: Mod assist         General transfer comment: Mod assist to stand from recliner chair to power up and help control descent back down to sitting.   Ambulation/Gait Ambulation/Gait assistance: Min assist Ambulation Distance (Feet): 55 Feet Assistive device: Rolling walker (2 wheeled) Gait Pattern/deviations: Step-through pattern;Shuffle Gait velocity: decreased Gait velocity interpretation: <1.8 ft/sec, indicate of risk for recurrent falls General Gait Details: Pt exceedingly slow gait speed and assist needed at trunk for balance and to help mostly steer RW while turning and attempting to keep a straight path in the hallway.            Balance Overall balance assessment: Needs assistance Sitting-balance support: Feet supported;Bilateral upper extremity supported Sitting balance-Leahy Scale: Poor Sitting balance - Comments: needs hands to  support   Standing balance support: Bilateral upper extremity supported Standing balance-Leahy Scale: Poor Standing balance comment: dependent on RW and therapist.                             Cognition Arousal/Alertness: Awake/alert Behavior During Therapy: Flat affect Overall Cognitive Status: Within Functional Limits for tasks assessed                                 General Comments: Slow to respond, but not really sure of baseline, does not talk much unless asked a question and very susinct.          General Comments General comments (skin integrity, edema, etc.): O2 sats stable during gait on RA, so left off of O2 at the end of the session.       Pertinent Vitals/Pain Pain Assessment: Faces Faces Pain Scale: Hurts little more Pain Location: surgical site Pain Descriptors / Indicators: Sore Pain Intervention(s): Limited activity within patient's tolerance;Monitored during session;Repositioned           PT Goals (current goals can now be found in the care plan section) Acute Rehab PT Goals Patient Stated Goal: to go to rehab to get stronger Progress towards PT goals: Progressing toward goals    Frequency    Min 5X/week      PT Plan Current plan remains appropriate       AM-PAC PT "6 Clicks" Daily Activity  Outcome Measure  Difficulty turning over in bed (including adjusting bedclothes, sheets and blankets)?: Unable Difficulty moving from lying on back to sitting on the side of the bed? : Unable Difficulty sitting down on and standing up from a chair with arms (e.g., wheelchair, bedside commode, etc,.)?: Unable Help needed moving to and from a bed to chair (including a wheelchair)?: A Lot Help needed walking in hospital room?: A Little Help needed climbing 3-5 steps with a railing? : A Lot 6 Click Score: 10    End of Session Equipment Utilized During Treatment: Back brace Activity Tolerance: Patient limited by fatigue Patient  left: with call bell/phone within reach;in chair;with family/visitor present   PT Visit Diagnosis: Unsteadiness on feet (R26.81);Other abnormalities of gait and mobility (R26.89)     Time: 1610-9604 PT Time Calculation (min) (ACUTE ONLY): 27 min  Charges:  $Gait Training: 23-37 mins          Rahshawn Remo B. Jefferie Holston, PT, DPT [phone]             03/07/2018, 2:29 PM

## 2018-03-07 NOTE — Clinical Social Work Placement (Addendum)
   CLINICAL SOCIAL WORK PLACEMENT  NOTE [facility] [place] RN to call [phone] at discharge  Date:  03/07/2018  Patient Details  Name: [name] MRN: [id number] Date of Birth: Apr 13, 1951  Clinical Social Work is seeking post-discharge placement for this patient at the Skilled  Nursing Facility level of care (*CSW will initial, date and re-position this form in  chart as items are completed):  Yes   Patient/family provided with [address] Clinical Social Work Department's list of facilities offering this level of care within the geographic area requested by the patient (or if unable, by the patient's family).  Yes   Patient/family informed of their freedom to choose among providers that offer the needed level of care, that participate in Medicare, Medicaid or managed care program needed by the patient, have an available bed and are willing to accept the patient.  Yes   Patient/family informed of [facility]'s ownership interest in [facility] and [facility], as well as of the fact that they are under no obligation to receive care at these facilities.  PASRR submitted to EDS on       PASRR number received on 03/05/18     Existing PASRR number confirmed on       FL2 transmitted to all facilities in geographic area requested by pt/family on 03/05/18     FL2 transmitted to all facilities within larger geographic area on       Patient informed that his/her managed care company has contracts with or will negotiate with certain facilities, including the following:        Yes   Patient/family informed of bed offers received.  Patient chooses bed at [facility]     Physician recommends and patient chooses bed at      Patient to be transferred to [facility] [place] on  .  Patient to be transferred to facility by [facility]     Patient family notified on 03/08/18 of transfer.  Name of family member notified:  sister     PHYSICIAN Please prepare  priority discharge summary, including medications     Additional Comment:    _______________________________________________ Doy Hutching, LCSWA 03/07/2018, 4:46 PM

## 2018-03-07 NOTE — Progress Notes (Signed)
     Subjective: 3 Days Post-Op Procedure(s) (LRB): Right transforaminal lumbar interbody fusion L1-2, L2-3 Posterior fusion T10, T11, T12, L1, L2 with T 11, T12 pedicle screws, superior sublaminar hooks T10, local bone graft, allograft cancellous chips Vivigen (N/A) Awake, alert and oriented x4. Voiding without difficulty. No BM. Standing and walking a few steps. Some leg weakness.   Patient reports pain as moderate.    Objective:   VITALS:  Temp:  [98.5 F (36.9 C)-100.1 F (37.8 C)] 98.9 F (37.2 C) (05/24 0812) Pulse Rate:  [94-107] 94 (05/24 0812) BP: (93-130)/(44-58) 102/56 (05/24 0812) SpO2:  [90 %-100 %] 92 % (05/24 0812)  Neurologically intact ABD soft Neurovascular intact Sensation intact distally Intact pulses distally Dorsiflexion/Plantar flexion intact Incision: dressing C/D/I No cellulitis present Compartment soft Motor does not show weakness, I suspect anemia with fatigue.   LABS Recent Labs    03/04/18 1352 03/04/18 1446  03/05/18 0218 03/05/18 1802 03/06/18 1650  HGB 11.6* 10.9*  --  9.6* 9.3* 9.2*  WBC  --   --    < > 14.2* 14.6* 18.4*  PLT  --   --    < > 157 152 172   < > = values in this interval not displayed.   Recent Labs    03/05/18 0218 03/06/18 1013  NA 136 137  K 4.3 4.3  CL 103 104  CO2 25 23  BUN 16 11  CREATININE 1.01* 0.79  GLUCOSE 219* 165*   No results for input(s): LABPT, INR in the last 72 hours.   Assessment/Plan: 3 Days Post-Op Procedure(s) (LRB): Right transforaminal lumbar interbody fusion L1-2, L2-3 Posterior fusion T10, T11, T12, L1, L2 with T 11, T12 pedicle screws, superior sublaminar hooks T10, local bone graft, allograft cancellous chips Vivigen (N/A)  Anemia is stable.   Advance diet Up with therapy D/C IV fluids PT OT slow by extent of surgery neurologic normal exam SNF placement.  [name] 03/07/2018, 8:56 AMPatient ID: [name], female   DOB: 09-Sep-1951, 67 y.o.   MRN: [id number]

## 2018-03-07 NOTE — Care Management Important Message (Signed)
Important Message  Patient Details  Name: [name] MRN: [id number] Date of Birth: [birth year]   Medicare Important Message Given:  Yes    Drishti Pepperman P Bellamy Judson 03/07/2018, 3:27 PM

## 2018-03-07 NOTE — Telephone Encounter (Signed)
FYI

## 2018-03-07 NOTE — Progress Notes (Addendum)
15:45 called Dr. Otelia Sergeant answering service directed to Dr. Magnus Ivan who is on call for him regarding the patients low blood pressure and dizzy feeling while walking. Ordered to call Dr. Barbaraann Faster cell phone.   15:49 called Dr. Otelia Sergeant cell and left a message regarding patients low blood pressures and dizzy feeling. No response. Will continue to monitor.

## 2018-03-08 DIAGNOSIS — R69 Illness, unspecified: Secondary | ICD-10-CM | POA: Diagnosis not present

## 2018-03-08 DIAGNOSIS — M5136 Other intervertebral disc degeneration, lumbar region: Secondary | ICD-10-CM | POA: Diagnosis not present

## 2018-03-08 DIAGNOSIS — D509 Iron deficiency anemia, unspecified: Secondary | ICD-10-CM | POA: Diagnosis not present

## 2018-03-08 DIAGNOSIS — M158 Other polyosteoarthritis: Secondary | ICD-10-CM | POA: Diagnosis not present

## 2018-03-08 DIAGNOSIS — M4325 Fusion of spine, thoracolumbar region: Secondary | ICD-10-CM | POA: Diagnosis not present

## 2018-03-08 DIAGNOSIS — M545 Low back pain: Secondary | ICD-10-CM | POA: Diagnosis not present

## 2018-03-08 DIAGNOSIS — J45998 Other asthma: Secondary | ICD-10-CM | POA: Diagnosis not present

## 2018-03-08 DIAGNOSIS — Z743 Need for continuous supervision: Secondary | ICD-10-CM | POA: Diagnosis not present

## 2018-03-08 DIAGNOSIS — M4814 Ankylosing hyperostosis [Forestier], thoracic region: Secondary | ICD-10-CM | POA: Diagnosis not present

## 2018-03-08 DIAGNOSIS — E119 Type 2 diabetes mellitus without complications: Secondary | ICD-10-CM | POA: Diagnosis not present

## 2018-03-08 DIAGNOSIS — M48062 Spinal stenosis, lumbar region with neurogenic claudication: Secondary | ICD-10-CM | POA: Diagnosis not present

## 2018-03-08 DIAGNOSIS — I10 Essential (primary) hypertension: Secondary | ICD-10-CM | POA: Diagnosis not present

## 2018-03-08 DIAGNOSIS — M5430 Sciatica, unspecified side: Secondary | ICD-10-CM | POA: Diagnosis not present

## 2018-03-08 DIAGNOSIS — R279 Unspecified lack of coordination: Secondary | ICD-10-CM | POA: Diagnosis not present

## 2018-03-08 DIAGNOSIS — H8149 Vertigo of central origin, unspecified ear: Secondary | ICD-10-CM | POA: Diagnosis not present

## 2018-03-08 DIAGNOSIS — R262 Difficulty in walking, not elsewhere classified: Secondary | ICD-10-CM | POA: Diagnosis not present

## 2018-03-08 DIAGNOSIS — M549 Dorsalgia, unspecified: Secondary | ICD-10-CM | POA: Diagnosis not present

## 2018-03-08 DIAGNOSIS — E785 Hyperlipidemia, unspecified: Secondary | ICD-10-CM | POA: Diagnosis not present

## 2018-03-08 LAB — BPAM RBC
BLOOD PRODUCT EXPIRATION DATE: 201906022359
Blood Product Expiration Date: 201906022359
Blood Product Expiration Date: 201906022359
Blood Product Expiration Date: 201906152359
UNIT TYPE AND RH: 5100
UNIT TYPE AND RH: 5100
Unit Type and Rh: 5100
Unit Type and Rh: 6200

## 2018-03-08 LAB — TYPE AND SCREEN
ABO/RH(D): A POS
Antibody Screen: POSITIVE
UNIT DIVISION: 0
UNIT DIVISION: 0
Unit division: 0
Unit division: 0

## 2018-03-08 LAB — GLUCOSE, CAPILLARY
Glucose-Capillary: 150 mg/dL — ABNORMAL HIGH (ref 65–99)
Glucose-Capillary: 159 mg/dL — ABNORMAL HIGH (ref 65–99)

## 2018-03-08 MED ORDER — OXYCODONE HCL ER 15 MG PO T12A: 15.0000 mg | EXTENDED_RELEASE_TABLET | Freq: Two times a day (BID) | ORAL | 0 refills | Status: AC

## 2018-03-08 MED ORDER — POLYETHYLENE GLYCOL 3350 17 G PO PACK: 17.0000 g | PACK | Freq: Every day | ORAL | 0 refills | Status: DC | PRN

## 2018-03-08 MED ORDER — DOCUSATE SODIUM 100 MG PO CAPS: 100.0000 mg | ORAL_CAPSULE | Freq: Two times a day (BID) | ORAL | 0 refills | Status: DC

## 2018-03-08 MED ORDER — FLEET ENEMA 7-19 GM/118ML RE ENEM: 1.0000 | ENEMA | Freq: Once | RECTAL | 2 refills | Status: DC | PRN

## 2018-03-08 MED ORDER — GABAPENTIN 300 MG PO CAPS: 300.0000 mg | ORAL_CAPSULE | Freq: Three times a day (TID) | ORAL | 3 refills | Status: DC

## 2018-03-08 MED ORDER — OXYCODONE HCL 5 MG PO TABS: 5.0000 mg | ORAL_TABLET | ORAL | 0 refills | Status: AC | PRN

## 2018-03-08 MED ORDER — FERROUS GLUCONATE 324 (38 FE) MG PO TABS: 324.0000 mg | ORAL_TABLET | Freq: Three times a day (TID) | ORAL | 1 refills | Status: DC

## 2018-03-08 MED ORDER — BISACODYL 5 MG PO TBEC: 5.0000 mg | DELAYED_RELEASE_TABLET | Freq: Every day | ORAL | 0 refills | Status: DC | PRN

## 2018-03-08 NOTE — Progress Notes (Signed)
Pt d/c to [facility], report given to staff. IV removed. D/c instructions with printed prescription given to PT[facility] to take with patient to facility. Pt sister aware of d/c placement. Patient with all belongings at time of d/c.

## 2018-03-08 NOTE — Discharge Summary (Signed)
Physician Discharge Summary      Patient ID: SARON VANORMAN MRN: [id number] DOB/AGE: 1950/12/17 67 y.o.  Admit date: 03/04/2018 Discharge date: 03/08/2018  Admission Diagnoses:  Principal Problem:   Spinal stenosis, lumbar region with neurogenic claudication Active Problems:   Multilevel degenerative disc disease   Forestier's disease of thoracolumbar region   Acute blood loss as cause of postoperative anemia   Fusion of spine of thoracolumbar region   Discharge Diagnoses:  Same  Past Medical History:  Diagnosis Date  . Asthma   . Bronchitis   . Diabetes mellitus   . Hyperlipidemia   . Hypertension   . Low back pain   . Migraine   . Osteoarthritis   . Sciatica   . Varicose veins   . Vertigo     Surgeries: Procedure(s): Right transforaminal lumbar interbody fusion L1-2, L2-3 Posterior fusion T10, T11, T12, L1, L2 with T 11, T12 pedicle screws, superior sublaminar hooks T10, local bone graft, allograft cancellous chips Vivigen on 03/04/2018   Consultants:   Discharged Condition: Improved  Hospital Course: CHARLESE GRUETZMACHER is an 67 y.o. female who was admitted 03/04/2018 with a chief complaint of No chief complaint on file. , and found to have a diagnosis of Spinal stenosis, lumbar region with neurogenic claudication.  She was brought to the operating room on 03/04/2018 and underwent the above named procedures.    She was given perioperative antibiotics:  Anti-infectives (From admission, onward)   Start     Dose/Rate Route Frequency Ordered Stop   03/04/18 1800  ceFAZolin (ANCEF) IVPB 2g/100 mL premix     2 g 200 mL/hr over 30 Minutes Intravenous Every 8 hours 03/04/18 1658 03/05/18 0251   03/04/18 1316  vancomycin (VANCOCIN) powder  Status:  Discontinued       As needed 03/04/18 1316 03/04/18 1532   03/04/18 0630  ceFAZolin (ANCEF) IVPB 2g/100 mL premix     2 g 200 mL/hr over 30 Minutes Intravenous On call to O.R. 03/04/18 0622 03/04/18 1217   03/04/18  0625  ceFAZolin (ANCEF) 2-4 GM/100ML-% IVPB    Note to Pharmacy:  Ray Church   : cabinet override      03/04/18 0625 03/04/18 0820    She recovered in the PACU post surgery, surgical time was about 8 hours, EBL 1700CC and received 860 CC of cell saver blood Autogenous intra op. She was transferred to Neurosurgery progressive [facility] Room 8. There she was maintained on IVF and po and IVF pain relievers. She was also give robaxin for muscle relaxer. IV antibiotics of Ancef continued for 24 hours post op and a hemovac drain was kept in Place in the lumbar spine for a total of 36 hours due to incision placed vancomycin powder at the end of the surgical procedure. POD#1 foley discontinued And she had her IVF discontinued however she had an episode of low blood pressure with standing to walk and with pain so that IVF was restarted and she was  bolused 500 CC of NS and IVF rate of 125 CC and this was maintained for 48 more hours. Her Hgb remained stable with Hgb of 10 then decreased to a minimum Of 9.2 on POD #2. Her antihypertensive agents of Norvasc were held for SBP < 120 or DBP < 75. She was taking and tolerating po nourishment of POD#1. On POD#1 she showed tendency to urinary retention so in and out catheterization was started for 24 hours and at that time she  was able to void without out difficulty. She was started on ferrous gluconate on POD#2. She was also maintained on protonix. PT and OT began working with patient on POD#1 and could only stand At the bedside initially but over the course of the next 3 days she gradually showed improved ability to stand and walk short distances. Her neurologic exam of the Lower extremities remained normal post op. Intraop neuromonitoring also showed no abnormalities during the course of her surgery with pedicle screw resistance levels Well within the acceptable level. Her dressing was changed on POD#2, Hemovac removed on POD #2. She had a low grade fever  on POD#2 that responded to  Pulmonary toilet and incentive spirometry. On POD #3 it was evident that PT and OT was showing slow progression and she showed fatigue and evidence of Preadmission deconditioning due to prolong nature of the spinal stenosis and tendency to recumbancy. Overall she did show good capacity for improvement With the need for extended post hospitalization physical therapy and rehabilitation. A SNF placement was undertaken. FL2 form signed. On 03/08/2018 a skilled nursing Facility acceptance and she was transferred to the SNF in [place] on 03/08/2018. On the morning of transfer she is awake, alert and oriented x 4. Antihypertensive agents Are being held and it is recommend that her BP be assessed, when her hyper tension recurs then cozaar and norvasc may need to be restart. If she shows signs of  Fluid retension then lasix and potassium can be restarted, she has taken the lasix and potassium in the past on a PRN basis. She was tolerating a low carbohydrate modified Diet with blood sugars in the 130-160 range post op. At the time of discharge she is advised to restart her oral antihyperglycemic, Alogliptin and metformin and resume the BID novolin 70/30 60 units SQ. Her dressing was changed with minimal drainage over the upper half of the incision clear yellow serous drainage, no erythrema or induration. She is  Afebrile and VSS. She is not hypertensive at the time of discharge probably due to anemia and should remain off antihypertensive agents until hyper tension recurs and should Be evaluated by her primary care physician in the next 1-2 weeks to assess need to adjust diabetes medications and antihypertensive agents and surveillance the Anemia due to blood loss perioperatively. She should continue with incentive spirometry for 1 week from the time of discharge.   She was given sequential compression devices, early ambulation, and chemoprophylaxis for DVT prophylaxis.  She  benefited maximally from their hospital stay and there were no complications.    Recent vital signs:  Vitals:   03/08/18 0444 03/08/18 0806  BP:  (!) 108/55  Pulse: 97 94  Resp:  16  Temp:  99.7 F (37.6 C)  SpO2: 94% 93%    Recent laboratory studies:  Results for orders placed or performed during the hospital encounter of 03/04/18  Glucose, capillary  Result Value Ref Range   Glucose-Capillary 113 (H) 65 - 99 mg/dL   Comment 1 Notify RN   Glucose, capillary  Result Value Ref Range   Glucose-Capillary 178 (H) 65 - 99 mg/dL  CBC  Result Value Ref Range   WBC 14.2 (H) 4.0 - 10.5 K/uL   RBC 3.16 (L) 3.87 - 5.11 MIL/uL   Hemoglobin 9.6 (L) 12.0 - 15.0 g/dL   HCT 16.1 (L) 09.6 - 04.5 %   MCV 96.8 78.0 - 100.0 fL   MCH 30.4 26.0 - 34.0 pg   MCHC 31.4  30.0 - 36.0 g/dL   RDW 16.1 09.6 - 04.5 %   Platelets 157 150 - 400 K/uL  Basic Metabolic Panel  Result Value Ref Range   Sodium 136 135 - 145 mmol/L   Potassium 4.3 3.5 - 5.1 mmol/L   Chloride 103 101 - 111 mmol/L   CO2 25 22 - 32 mmol/L   Glucose, Bld 219 (H) 65 - 99 mg/dL   BUN 16 6 - 20 mg/dL   Creatinine, Ser 4.09 (H) 0.44 - 1.00 mg/dL   Calcium 7.7 (L) 8.9 - 10.3 mg/dL   GFR calc non Af Amer 56 (L) >60 mL/min   GFR calc Af Amer >60 >60 mL/min   Anion gap 8 5 - 15  Hemoglobin A1c  Result Value Ref Range   Hgb A1c MFr Bld 6.4 (H) 4.8 - 5.6 %   Mean Plasma Glucose 136.98 mg/dL  Glucose, capillary  Result Value Ref Range   Glucose-Capillary 191 (H) 65 - 99 mg/dL  Glucose, capillary  Result Value Ref Range   Glucose-Capillary 219 (H) 65 - 99 mg/dL  Glucose, capillary  Result Value Ref Range   Glucose-Capillary 195 (H) 65 - 99 mg/dL  Glucose, capillary  Result Value Ref Range   Glucose-Capillary 197 (H) 65 - 99 mg/dL  Glucose, capillary  Result Value Ref Range   Glucose-Capillary 187 (H) 65 - 99 mg/dL  CBC  Result Value Ref Range   WBC 14.6 (H) 4.0 - 10.5 K/uL   RBC 3.07 (L) 3.87 - 5.11 MIL/uL   Hemoglobin  9.3 (L) 12.0 - 15.0 g/dL   HCT 81.1 (L) 91.4 - 78.2 %   MCV 97.4 78.0 - 100.0 fL   MCH 30.3 26.0 - 34.0 pg   MCHC 31.1 30.0 - 36.0 g/dL   RDW 95.6 21.3 - 08.6 %   Platelets 152 150 - 400 K/uL  Glucose, capillary  Result Value Ref Range   Glucose-Capillary 128 (H) 65 - 99 mg/dL  Glucose, capillary  Result Value Ref Range   Glucose-Capillary 166 (H) 65 - 99 mg/dL  Glucose, capillary  Result Value Ref Range   Glucose-Capillary 144 (H) 65 - 99 mg/dL  Basic metabolic panel  Result Value Ref Range   Sodium 137 135 - 145 mmol/L   Potassium 4.3 3.5 - 5.1 mmol/L   Chloride 104 101 - 111 mmol/L   CO2 23 22 - 32 mmol/L   Glucose, Bld 165 (H) 65 - 99 mg/dL   BUN 11 6 - 20 mg/dL   Creatinine, Ser 5.78 0.44 - 1.00 mg/dL   Calcium 7.9 (L) 8.9 - 10.3 mg/dL   GFR calc non Af Amer >60 >60 mL/min   GFR calc Af Amer >60 >60 mL/min   Anion gap 10 5 - 15  Glucose, capillary  Result Value Ref Range   Glucose-Capillary 155 (H) 65 - 99 mg/dL  CBC with Differential/Platelet  Result Value Ref Range   WBC 18.4 (H) 4.0 - 10.5 K/uL   RBC 3.01 (L) 3.87 - 5.11 MIL/uL   Hemoglobin 9.2 (L) 12.0 - 15.0 g/dL   HCT 46.9 (L) 62.9 - 52.8 %   MCV 98.0 78.0 - 100.0 fL   MCH 30.6 26.0 - 34.0 pg   MCHC 31.2 30.0 - 36.0 g/dL   RDW 41.3 24.4 - 01.0 %   Platelets 172 150 - 400 K/uL   Neutrophils Relative % 73 %   Neutro Abs 13.3 (H) 1.7 - 7.7 K/uL   Lymphocytes Relative  18 %   Lymphs Abs 3.4 0.7 - 4.0 K/uL   Monocytes Relative 6 %   Monocytes Absolute 1.2 (H) 0.1 - 1.0 K/uL   Eosinophils Relative 2 %   Eosinophils Absolute 0.4 0.0 - 0.7 K/uL   Basophils Relative 0 %   Basophils Absolute 0.0 0.0 - 0.1 K/uL   Immature Granulocytes 1 %   Abs Immature Granulocytes 0.1 0.0 - 0.1 K/uL  Glucose, capillary  Result Value Ref Range   Glucose-Capillary 141 (H) 65 - 99 mg/dL  Glucose, capillary  Result Value Ref Range   Glucose-Capillary 164 (H) 65 - 99 mg/dL  Glucose, capillary  Result Value Ref Range    Glucose-Capillary 143 (H) 65 - 99 mg/dL  Glucose, capillary  Result Value Ref Range   Glucose-Capillary 191 (H) 65 - 99 mg/dL  Glucose, capillary  Result Value Ref Range   Glucose-Capillary 161 (H) 65 - 99 mg/dL  Glucose, capillary  Result Value Ref Range   Glucose-Capillary 117 (H) 65 - 99 mg/dL  Glucose, capillary  Result Value Ref Range   Glucose-Capillary 150 (H) 65 - 99 mg/dL  I-STAT 4, (NA,K, GLUC, HGB,HCT)  Result Value Ref Range   Sodium 138 135 - 145 mmol/L   Potassium 3.8 3.5 - 5.1 mmol/L   Glucose, Bld 176 (H) 65 - 99 mg/dL   HCT 24.4 (L) 01.0 - 27.2 %   Hemoglobin 11.9 (L) 12.0 - 15.0 g/dL  I-STAT 4, (NA,K, GLUC, HGB,HCT)  Result Value Ref Range   Sodium 138 135 - 145 mmol/L   Potassium 3.8 3.5 - 5.1 mmol/L   Glucose, Bld 219 (H) 65 - 99 mg/dL   HCT 53.6 (L) 64.4 - 03.4 %   Hemoglobin 11.6 (L) 12.0 - 15.0 g/dL  I-STAT 4, (NA,K, GLUC, HGB,HCT)  Result Value Ref Range   Sodium 138 135 - 145 mmol/L   Potassium 4.1 3.5 - 5.1 mmol/L   Glucose, Bld 230 (H) 65 - 99 mg/dL   HCT 74.2 (L) 59.5 - 63.8 %   Hemoglobin 11.6 (L) 12.0 - 15.0 g/dL  I-STAT 4, (NA,K, GLUC, HGB,HCT)  Result Value Ref Range   Sodium 139 135 - 145 mmol/L   Potassium 4.3 3.5 - 5.1 mmol/L   Glucose, Bld 220 (H) 65 - 99 mg/dL   HCT 75.6 (L) 43.3 - 29.5 %   Hemoglobin 10.9 (L) 12.0 - 15.0 g/dL  Type and screen [facility]  Result Value Ref Range   ABO/RH(D) A POS    Antibody Screen POS    Sample Expiration 03/07/2018    Unit Number [id number]    Blood Component Type RED CELLS,LR    Unit division 00    Status of Unit REL FROM [facility]    Transfusion Status OK TO TRANSFUSE    Crossmatch Result COMPATIBLE    Unit Number [id number]    Blood Component Type RED CELLS,LR    Unit division 00    Status of Unit REL FROM [facility]    Transfusion Status OK TO TRANSFUSE    Crossmatch Result COMPATIBLE    Unit Number [id number]    Blood Component Type RED CELLS,LR    Unit division  00    Status of Unit REL FROM [facility]    Transfusion Status OK TO TRANSFUSE    Crossmatch Result      COMPATIBLE Performed at [facility], [address] [place], [place] [address]    Unit Number [id number]  Blood Component Type RED CELLS,LR    Unit division 00    Status of Unit REL FROM [facility]    Transfusion Status OK TO TRANSFUSE    Crossmatch Result COMPATIBLE   BPAM RBC  Result Value Ref Range   Blood Product Unit Number [id number]    Unit Type and Rh 5100    Blood Product Expiration Date 562130865784    Blood Product Unit Number [id number]    Unit Type and Rh 5100    Blood Product Expiration Date 401027253664    Blood Product Unit Number [id number]    Unit Type and Rh 5100    Blood Product Expiration Date 756433295188    Blood Product Unit Number [id number]    Unit Type and Rh 6200    Blood Product Expiration Date 932355732202     Discharge Medications:   Allergies as of 03/08/2018   No Known Allergies     Medication List    STOP taking these medications   amLODipine 5 MG tablet Commonly known as:  NORVASC   diclofenac 50 MG tablet Commonly known as:  CATAFLAM   losartan 50 MG tablet Commonly known as:  COZAAR   traMADol 50 MG tablet Commonly known as:  ULTRAM     TAKE these medications   Alogliptin-metFORMIN HCl 12.02-999 MG Tabs Take 1 tablet by mouth 2 (two) times daily.   aspirin EC 81 MG tablet Take 81 mg by mouth daily.   bisacodyl 5 MG EC tablet Commonly known as:  DULCOLAX Take 1 tablet (5 mg total) by mouth daily as needed for moderate constipation.   docusate sodium 100 MG capsule Commonly known as:  COLACE Take 1 capsule (100 mg total) by mouth 2 (two) times daily.   ferrous gluconate 324 MG tablet Commonly known as:  FERGON Take 1 tablet (324 mg total) by mouth 3 (three) times daily with meals.   furosemide 20 MG tablet Commonly known as:  LASIX Take 20 mg by mouth daily as needed for fluid.   gabapentin  300 MG capsule Commonly known as:  NEURONTIN Take 1 capsule (300 mg total) by mouth 3 (three) times daily.   insulin NPH-regular Human (70-30) 100 UNIT/ML injection Commonly known as:  NOVOLIN 70/30 Inject 66 Units into the skin 2 (two) times daily.   methocarbamol 500 MG tablet Commonly known as:  ROBAXIN Take 500 mg by mouth every 6 (six) hours as needed for muscle spasms.   oxyCODONE 5 MG immediate release tablet Commonly known as:  Oxy IR/ROXICODONE Take 1 tablet (5 mg total) by mouth every 3 (three) hours as needed for up to 7 days for moderate pain or breakthrough pain ((score 4 to 6)).   oxyCODONE 15 mg 12 hr tablet Commonly known as:  OXYCONTIN Take 1 tablet (15 mg total) by mouth every 12 (twelve) hours for 7 days.   polyethylene glycol packet Commonly known as:  MIRALAX / GLYCOLAX Take 17 g by mouth daily as needed for mild constipation.   potassium chloride 10 MEQ tablet Commonly known as:  K-DUR Take 10 mEq by mouth daily as needed (with Lasix).   simvastatin 20 MG tablet Commonly known as:  ZOCOR Take 20 mg by mouth at bedtime.   sodium phosphate 7-19 GM/118ML Enem Place 133 mLs (1 enema total) rectally once as needed for severe constipation.   zolpidem 6.25 MG CR tablet Commonly known as:  AMBIEN CR Take 1 tablet (6.25 mg total) by mouth at bedtime as needed for sleep.  Durable Medical Equipment  (From admission, onward)        Start     Ordered   03/04/18 1659  DME 3 n 1  Once     03/04/18 1658   03/04/18 1659  DME Walker rolling  Once    Question:  Patient needs a walker to treat with the following condition  Answer:  Fusion of spine of thoracolumbar region   03/04/18 1658      Diagnostic Studies: Dg Chest 2 View  Result Date: 02/28/2018 CLINICAL DATA:  Preop for lumbar surgery. History of asthma and diabetes. EXAM: CHEST - 2 VIEW COMPARISON:  05/31/2010. FINDINGS: Mediastinum and hilar structures are normal. Lungs are clear. No  pleural effusion or pneumothorax. Heart size normal. Surgical clips right upper quadrant. Prior lumbar spine fusion. Degenerative changes both shoulders and thoracolumbar spine. IMPRESSION: No acute cardiopulmonary disease. Electronically Signed   By: Maisie Fus  Register   On: 02/28/2018 16:32   Dg Thoracolumabar Spine  Result Date: 03/04/2018 CLINICAL DATA:  Status post surgical posterior fusion of thoracolumbar junction. EXAM: DG C-ARM 61-120 MIN; THORACOLUMBAR SPINE - 2 VIEW Radiation exposure index: 67.14 mGy. COMPARISON:  None. FINDINGS: Nine intraoperative fluoroscopic images were obtained of the lower thoracic and lumbar spine. These images demonstrate the patient be status post surgical posterior fusion extending from T10-L3 with bilateral intrapedicular screw placement. Previous surgical fusion of lower lumbar spine is noted. IMPRESSION: Status post surgical posterior fusion of thoracolumbar junction. Electronically Signed   By: Lupita Raider, M.D.   On: 03/04/2018 15:14   Dg C-arm 1-60 Min  Result Date: 03/04/2018 CLINICAL DATA:  Status post surgical posterior fusion of thoracolumbar junction. EXAM: DG C-ARM 61-120 MIN; THORACOLUMBAR SPINE - 2 VIEW Radiation exposure index: 67.14 mGy. COMPARISON:  None. FINDINGS: Nine intraoperative fluoroscopic images were obtained of the lower thoracic and lumbar spine. These images demonstrate the patient be status post surgical posterior fusion extending from T10-L3 with bilateral intrapedicular screw placement. Previous surgical fusion of lower lumbar spine is noted. IMPRESSION: Status post surgical posterior fusion of thoracolumbar junction. Electronically Signed   By: Lupita Raider, M.D.   On: 03/04/2018 15:14   Dg C-arm 1-60 Min  Result Date: 03/04/2018 CLINICAL DATA:  Status post surgical posterior fusion of thoracolumbar junction. EXAM: DG C-ARM 61-120 MIN; THORACOLUMBAR SPINE - 2 VIEW Radiation exposure index: 67.14 mGy. COMPARISON:  None. FINDINGS:  Nine intraoperative fluoroscopic images were obtained of the lower thoracic and lumbar spine. These images demonstrate the patient be status post surgical posterior fusion extending from T10-L3 with bilateral intrapedicular screw placement. Previous surgical fusion of lower lumbar spine is noted. IMPRESSION: Status post surgical posterior fusion of thoracolumbar junction. Electronically Signed   By: Lupita Raider, M.D.   On: 03/04/2018 15:14   Dg C-arm 1-60 Min  Result Date: 03/04/2018 CLINICAL DATA:  Status post surgical posterior fusion of thoracolumbar junction. EXAM: DG C-ARM 61-120 MIN; THORACOLUMBAR SPINE - 2 VIEW Radiation exposure index: 67.14 mGy. COMPARISON:  None. FINDINGS: Nine intraoperative fluoroscopic images were obtained of the lower thoracic and lumbar spine. These images demonstrate the patient be status post surgical posterior fusion extending from T10-L3 with bilateral intrapedicular screw placement. Previous surgical fusion of lower lumbar spine is noted. IMPRESSION: Status post surgical posterior fusion of thoracolumbar junction. Electronically Signed   By: Lupita Raider, M.D.   On: 03/04/2018 15:14    Disposition: Discharge disposition: 03-Skilled Nursing Facility       Discharge Instructions  Call MD / Call 911   Complete by:  As directed    If you experience chest pain or shortness of breath, CALL 911 and be transported to the hospital emergency room.  If you develope a fever above 101 F, pus (white drainage) or increased drainage or redness at the wound, or calf pain, call your surgeon's office.   Constipation Prevention   Complete by:  As directed    Drink plenty of fluids.  Prune juice may be helpful.  You may use a stool softener, such as Colace (over the counter) 100 mg twice a day.  Use MiraLax (over the counter) for constipation as needed.   Diet - low sodium heart healthy   Complete by:  As directed    Discharge instructions   Complete by:  As directed     Laminectomy, Care After This sheet gives you information about how to care for yourself after your procedure. Your health care provider may also give you more specific instructions. If you have problems or questions, contact your health care provider. What can I expect after the procedure? After the procedure, it is common to have: Some pain around your incision area. Muscle tightening (spasms) across the back.  Follow these instructions at home: Incision care Follow instructions from your health care provider about how to take care of your incision area. Make sure you: Wash your hands with soap and water before and after you apply medicine to the area or change your bandage (dressing). If soap and water are not available, use hand sanitizer. Change your dressing as told by your health care provider. Leave stitches (sutures), skin glue, or adhesive strips in place. These skin closures may need to stay in place for 2 weeks or longer. If adhesive strip edges start to loosen and curl up, you may trim the loose edges. Do not remove adhesive strips completely unless your health care provider tells you to do that. Check your incision area every day for signs of infection. Check for: More redness, swelling, or pain. More fluid or blood. Warmth. Pus or a bad smell. Medicines Take over-the-counter and prescription medicines only as told by your health care provider. If you were prescribed an antibiotic medicine, use it as told by your health care provider. Do not stop using the antibiotic even if you start to feel better. Bathing Do not take baths, swim, or use a hot tub for 2 weeks, or until your incision has healed completely. If your health care provider approves, you may take showers after your dressing has been removed. Activity Return to your normal activities as told by your health care provider. Ask your health care provider what activities are safe for you. Avoid bending or twisting at  your waist. Always bend at your knees. Do not sit for more than 20-30 minutes at a time. Lie down or walk between periods of sitting. Do not lift anything that is heavier than 10 lb (4.5 kg) or the limit that your health care provider tells you, until he or she says that it is safe. Do not drive for 2 weeks after your procedure or for as long as your health care provider tells you. Do not drive or use heavy machinery while taking prescription pain medicine. General instructions To prevent or treat constipation while you are taking prescription pain medicine, your health care provider may recommend that you: Drink enough fluid to keep your urine clear or pale yellow. Take over-the-counter or prescription medicines. Eat foods  that are high in fiber, such as fresh fruits and vegetables, whole grains, and beans. Limit foods that are high in fat and processed sugars, such as fried and sweet foods. Do breathing exercises as told. Keep all follow-up visits as told by your health care provider. This is important. Contact a health care provider if: You have more redness, swelling, or pain around your incision area. Your incision feels warm to the touch. You are not able to return to activities or do exercises as told by your health care provider. Get help right away if: You have: More fluid or blood coming from your incision area. Pus or a bad smell coming from your incision area. Chills or a fever. Episodes of dizziness or fainting while standing. You develop a rash. You develop shortness of breath or you have difficulty breathing. You cannot control when you urinate or have a bowel movement. You become weak. You are not able to use your legs. Summary After the procedure, it is common to have some pain around your incision area. You may also have muscle tightening (spasms) across the back. Follow instructions from your health care provider about how to care for your incision. Do not lift  anything that is heavier than 10 lb (4.5 kg) or the limit that your health care provider tells you, until he or she says that it is safe. Contact your health care provider if you have more redness, swelling, or pain around your incision area or if your incision feels warm to the touch. These can be signs of infection. This information is not intended to replace advice given to you by your health care provider. Make sure you discuss any questions you have with your health care provider. Document Released: 04/20/2005 Document Revised: 05/17/2016 Document Reviewed: 03/18/2016 Elsevier Interactive Patient Education  2018 [employer].   Spinal Stenosis Spinal stenosis occurs when the open space (spinal canal) between the bones of your spine (vertebrae) narrows, putting pressure on the spinal cord or nerves. What are the causes? This condition is caused by areas of bone pushing into the central canals of your vertebrae. This condition may be present at birth (congenital), or it may be caused by: Arthritic deterioration of your vertebrae (spinal degeneration). This usually starts around age 64. Injury or trauma to the spine. Tumors in the spine. Calcium deposits in the spine.  What are the signs or symptoms? Symptoms of this condition include: Pain in the neck or back that is generally worse with activities, particularly when standing and walking. Numbness, tingling, hot or cold sensations, weakness, or weariness in your legs. Pain going up and down the leg (sciatica). Frequent episodes of falling. A foot-slapping gait that leads to muscle weakness.  In more serious cases, you may develop: Problemspassing stool or passing urine. Difficulty having sex. Loss of feeling in part or all of your leg.  Symptoms may come on slowly and get worse over time. How is this diagnosed? This condition is diagnosed based on your medical history and a physical exam. Tests will also be done, such  as: MRI. CT scan. X-ray.  How is this treated? Treatment for this condition often focuses on managing your pain and any other symptoms. Treatment may include: Practicing good posture to lessen pressure on your nerves. Exercising to strengthen muscles, build endurance, improve balance, and maintain good joint movement (range of motion). Losing weight, if needed. Taking medicines to reduce swelling, inflammation, or pain. Assistive devices, such as a corset or brace.  In some cases, surgery may be needed. The most common procedure is decompression laminectomy. This is done to remove excess bone that puts pressure on your nerve roots. Follow these instructions at home: Managing pain, stiffness, and swelling Do all exercises and stretches as told by your health care provider. Practice good posture. If you were given a brace or a corset, wear it as told by your health care provider. Do not do any activities that cause pain. Ask your health care provider what activities are safe for you. Do not lift anything that is heavier than 10 lb (4.5 kg) or the limit that your health care provider tells you. Maintain a healthy weight. Talk with your health care provider if you need help losing weight. If directed, apply heat to the affected area as often as told by your health care provider. Use the heat source that your health care provider recommends, such as a moist heat pack or a heating pad. Place a towel between your skin and the heat source. Leave the heat on for 20-30 minutes. Remove the heat if your skin turns bright red. This is especially important if you are not able to feel pain, heat, or cold. You may have a greater risk of getting burned. General instructions Take over-the-counter and prescription medicines only as told by your health care provider. Do not use any products that contain nicotine or tobacco, such as cigarettes and e-cigarettes. If you need help quitting, ask your health care  provider. Eat a healthy diet. This includes plenty of fruits and vegetables, whole grains, and low-fat (lean) protein. Keep all follow-up visits as told by your health care provider. This is important. Contact a health care provider if: Your symptoms do not get better or they get worse. You have a fever. Get help right away if: You have new or worse pain in your neck or upper back. You have severe pain that cannot be controlled with medicines. You are dizzy. You have vision problems, blurred vision, or double vision. You have a severe headache that is worse when you stand. You have nausea or you vomit. You develop new or worse numbness or tingling in your back or legs. You have pain, redness, swelling, or warmth in your arm or leg. Summary Spinal stenosis occurs when the open space (spinal canal) between the bones of your spine (vertebrae) narrows. This narrowing puts pressure on the spinal cord or nerves. Spinal stenosis can cause numbness, weakness, or pain in the neck, back, and legs. This condition may be caused by a birth defect, arthritic deterioration of your vertebrae, injury, tumors, or calcium deposits. This condition is usually diagnosed with MRIs, CT scans, and X-rays. This information is not intended to replace advice given to you by your health care provider. Make sure you discuss any questions you have with your health care provider. Document Released: 12/22/2003 Document Revised: 09/05/2016 Document Reviewed: 09/05/2016 Elsevier Interactive Patient Education  2018 [employer].   Spinal Stenosis Spinal stenosis happens when the open space (spinal canal) between the bones of your spine (vertebrae) gets smaller. It is caused by bone pushing into the open spaces of your backbone (spine). This puts pressure on your backbone and the nerves in your backbone. Treatment often focuses on managing any pain and symptoms. In some cases, surgery may be needed. Follow these instructions  at home: Managing pain, stiffness, and swelling Do all exercises and stretches as told by your doctor. Stand and sit up straight (use good posture). If  you were given a brace or a corset, wear it as told by your doctor. Do not do any activities that cause pain. Ask your doctor what activities are safe for you. Do not lift anything that is heavier than 10 lb (4.5 kg) or heavier than your doctor tells you. Try to stay at a healthy weight. Talk with your doctor if you need help losing weight. If directed, put heat on the affected area as often as told by your doctor. Use the heat source that your doctor recommends, such as a moist heat pack or a heating pad. Put a towel between your skin and the heat source. Leave the heat on for 20-30 minutes. Remove the heat if your skin turns bright red. This is especially important if you are not able to feel pain, heat, or cold. You may have a greater risk of getting burned. General instructions Take over-the-counter and prescription medicines only as told by your doctor. Do not use any products that contain nicotine or tobacco, such as cigarettes and e-cigarettes. If you need help quitting, ask your doctor. Eat a healthy diet. This includes plenty of fruits and vegetables, whole grains, and low-fat (lean) protein. Keep all follow-up visits as told by your doctor. This is important. Contact a doctor if: Your symptoms do not get better. Your symptoms get worse. You have a fever. Get help right away if: You have new or worse pain in your neck or upper back. You have very bad pain that medicine does not control. You are dizzy. You have vision problems, blurred vision, or double vision. You have a very bad headache that is worse when you stand. You feel sick to your stomach (nauseous). You throw up (vomit). You have new or worse numbness or tingling in your back or legs. You have pain, redness, swelling, or warmth in your arm or leg. Summary Spinal  stenosis happens when the open space (spinal canal) between the bones of your spine gets smaller (narrow). Contact a doctor if your symptoms get worse. In some cases, surgery may be needed. This information is not intended to replace advice given to you by your health care provider. Make sure you discuss any questions you have with your health care provider. Document Released: 01/25/2011 Document Revised: 09/05/2016 Document Reviewed: 09/05/2016 Elsevier Interactive Patient Education  2017 [employer].    Call if there is increasing drainage, fever greater than 101.5, severe head aches, and worsening nausea or light sensitivity. If shortness of breath, bloody cough or chest tightness or pain go to an emergency room. No lifting greater than 10 lbs. Avoid bending, stooping and twisting. Use brace when sitting and out of bed even to go to bathroom. Walk in house for first 2 weeks then may start to get out slowly increasing distances up to one quarter mile by 4-6 weeks post op. After 5 days may shower and change dressing following bathing with shower.When bathing remove the brace shower and replace brace before getting out of the shower. If drainage, keep dry dressing and do not bathe the incision, use an moisture impervious dressing. Please call and return for scheduled follow up appointment in 1 1/ 2 weeks from the time of discharge for staple removal.   Driving restrictions   Complete by:  As directed    No driving for 8 weeks   Increase activity slowly as tolerated   Complete by:  As directed    Lifting restrictions   Complete by:  As directed  No lifting for 12 weeks       Contact information for follow-up providers    Kerrin Champagne, MD In 2 weeks.   Specialty:  Orthopedic Surgery Why:  For wound re-check, For suture removal Contact information: [address] [place] [address] [phone]            Contact information for after-discharge care     Destination    [facility] .   Service:  Skilled Nursing Contact information: [address] [place] [address] [phone]                   Signed: [name] 03/08/2018, 10:56 AM

## 2018-03-08 NOTE — Clinical Social Work Note (Addendum)
Pt discharging today to [facility]. Pt aware and agreeable to discharge plan. CSW confirmed bed with Debbie in admissions at [facility] [phone]). CSW updated RN Deanna Artis, who will call report to [phone] for room A2. CSW arranged transportation with [facility]. CSW also left a HIPPA-friendly voicemail for pt's sister Aniya Jolicoeur [phone]). CSW signing off as no further Social Work intervention needed.   Corlis Hove, [place], LCASA CSW Weekend Coverage-ED [phone]

## 2018-03-08 NOTE — Progress Notes (Signed)
     Subjective: 4 Days Post-Op Procedure(s) (LRB): Right transforaminal lumbar interbody fusion L1-2, L2-3 Posterior fusion T10, T11, T12, L1, L2 with T 11, T12 pedicle screws, superior sublaminar hooks T10, local bone graft, allograft cancellous chips Vivigen (N/A) Awake, alert and oriented x 4. No BM, voiding without difficulty. Standing and walking with assistance in hallway. Low blood pressure but Hgb is stable 9.2. On iron. SNF placement undertaken.   Patient reports pain as moderate.    Objective:   VITALS:  Temp:  [98.9 F (37.2 C)-99.8 F (37.7 C)] 99.7 F (37.6 C) (05/25 0806) Pulse Rate:  [91-109] 94 (05/25 0806) Resp:  [16] 16 (05/25 0806) BP: (75-109)/(50-55) 108/55 (05/25 0806) SpO2:  [89 %-99 %] 93 % (05/25 0806)  Neurologically intact ABD soft Neurovascular intact Sensation intact distally Intact pulses distally Dorsiflexion/Plantar flexion intact Incision: scant drainage and Waffle dressing applied.  No cellulitis present Compartment soft   LABS Recent Labs    03/05/18 1802 03/06/18 1650  HGB 9.3* 9.2*  WBC 14.6* 18.4*  PLT 152 172   Recent Labs    03/06/18 1013  NA 137  K 4.3  CL 104  CO2 23  BUN 11  CREATININE 0.79  GLUCOSE 165*   No results for input(s): LABPT, INR in the last 72 hours.   Assessment/Plan: 4 Days Post-Op Procedure(s) (LRB): Right transforaminal lumbar interbody fusion L1-2, L2-3 Posterior fusion T10, T11, T12, L1, L2 with T 11, T12 pedicle screws, superior sublaminar hooks T10, local bone graft, allograft cancellous chips Vivigen (N/A)  Advance diet Up with therapy D/C IV fluids Discharge to SNF  Discontinue oxygen.    [name] 03/08/2018, 10:11 AMPatient ID: [name], female   DOB: [birth year], 67 y.o.   MRN: [id number]

## 2018-03-10 LAB — TYPE AND SCREEN
ABO/RH(D): A POS
Antibody Screen: POSITIVE
DONOR AG TYPE: NEGATIVE
Donor AG Type: NEGATIVE
UNIT DIVISION: 0
Unit division: 0

## 2018-03-10 LAB — BPAM RBC
BLOOD PRODUCT EXPIRATION DATE: 201906012359
BLOOD PRODUCT EXPIRATION DATE: 201906052359
Unit Type and Rh: 6200
Unit Type and Rh: 6200

## 2018-03-11 DIAGNOSIS — I10 Essential (primary) hypertension: Secondary | ICD-10-CM | POA: Diagnosis not present

## 2018-03-11 DIAGNOSIS — D509 Iron deficiency anemia, unspecified: Secondary | ICD-10-CM | POA: Diagnosis not present

## 2018-03-11 DIAGNOSIS — M549 Dorsalgia, unspecified: Secondary | ICD-10-CM | POA: Diagnosis not present

## 2018-03-11 DIAGNOSIS — E785 Hyperlipidemia, unspecified: Secondary | ICD-10-CM | POA: Diagnosis not present

## 2018-03-12 DIAGNOSIS — M549 Dorsalgia, unspecified: Secondary | ICD-10-CM | POA: Diagnosis not present

## 2018-03-12 DIAGNOSIS — E119 Type 2 diabetes mellitus without complications: Secondary | ICD-10-CM | POA: Diagnosis not present

## 2018-03-17 DIAGNOSIS — M549 Dorsalgia, unspecified: Secondary | ICD-10-CM | POA: Diagnosis not present

## 2018-03-17 DIAGNOSIS — E119 Type 2 diabetes mellitus without complications: Secondary | ICD-10-CM | POA: Diagnosis not present

## 2018-03-19 ENCOUNTER — Ambulatory Visit (INDEPENDENT_AMBULATORY_CARE_PROVIDER_SITE_OTHER): Payer: Medicare HMO | Admitting: Surgery

## 2018-03-19 ENCOUNTER — Encounter (INDEPENDENT_AMBULATORY_CARE_PROVIDER_SITE_OTHER): Payer: Self-pay | Admitting: Surgery

## 2018-03-19 ENCOUNTER — Ambulatory Visit (INDEPENDENT_AMBULATORY_CARE_PROVIDER_SITE_OTHER): Payer: Medicare HMO

## 2018-03-19 VITALS — BP 135/67 | HR 66 | Temp 97.0°F | Ht 65.0 in | Wt 177.0 lb

## 2018-03-19 DIAGNOSIS — M48062 Spinal stenosis, lumbar region with neurogenic claudication: Secondary | ICD-10-CM

## 2018-03-19 NOTE — Patient Instructions (Signed)
-  Patient will continue to work with physical therapy.  Must avoid bending, twisting, lifting.  Must wear brace until at least 12 weeks postop.  -Staples were removed today.  Okay to shower but no tub soaking.  Do not apply any creams or ointments to incision.  Daily dressing changes with gauze and tape.  RN at nursing facility to perform daily wound checks.  -Anticipate discharge home from facility when patient is safe and independent.

## 2018-03-19 NOTE — Progress Notes (Signed)
67 year old female returns for recheck after having multilevel fusion.  She is doing well.  Currently doing rehab at skilled nursing facility in [place].  Preop leg pain is much better.  Exam Very pleasant female alert and oriented in no acute distress.  Surgical incision is healing very nicely.  No drainage or signs of infection.  Staples were removed and Steri-Strips applied.  .  Neurovascular intact.  X-rays Show that her hardware is intact.  No comp gating features.   Plan Patient continue rehab.  Follow-up with Dr. Otelia SergeantNitka in 4 weeks for recheck.  Continue brace until 12 weeks postop.  Advised patient that she should not be lifting, twisting, bending.

## 2018-03-20 DIAGNOSIS — M549 Dorsalgia, unspecified: Secondary | ICD-10-CM | POA: Diagnosis not present

## 2018-03-20 DIAGNOSIS — E119 Type 2 diabetes mellitus without complications: Secondary | ICD-10-CM | POA: Diagnosis not present

## 2018-03-24 DIAGNOSIS — I10 Essential (primary) hypertension: Secondary | ICD-10-CM | POA: Diagnosis not present

## 2018-03-24 DIAGNOSIS — M549 Dorsalgia, unspecified: Secondary | ICD-10-CM | POA: Diagnosis not present

## 2018-03-24 DIAGNOSIS — E119 Type 2 diabetes mellitus without complications: Secondary | ICD-10-CM | POA: Diagnosis not present

## 2018-03-26 DIAGNOSIS — G4700 Insomnia, unspecified: Secondary | ICD-10-CM | POA: Diagnosis not present

## 2018-03-26 DIAGNOSIS — M549 Dorsalgia, unspecified: Secondary | ICD-10-CM | POA: Diagnosis not present

## 2018-03-26 DIAGNOSIS — Z794 Long term (current) use of insulin: Secondary | ICD-10-CM | POA: Diagnosis not present

## 2018-03-26 DIAGNOSIS — J45909 Unspecified asthma, uncomplicated: Secondary | ICD-10-CM | POA: Diagnosis not present

## 2018-03-26 DIAGNOSIS — E785 Hyperlipidemia, unspecified: Secondary | ICD-10-CM | POA: Diagnosis not present

## 2018-03-26 DIAGNOSIS — Z981 Arthrodesis status: Secondary | ICD-10-CM | POA: Diagnosis not present

## 2018-03-26 DIAGNOSIS — I10 Essential (primary) hypertension: Secondary | ICD-10-CM | POA: Diagnosis not present

## 2018-03-26 DIAGNOSIS — D509 Iron deficiency anemia, unspecified: Secondary | ICD-10-CM | POA: Diagnosis not present

## 2018-03-26 DIAGNOSIS — E119 Type 2 diabetes mellitus without complications: Secondary | ICD-10-CM | POA: Diagnosis not present

## 2018-03-26 DIAGNOSIS — Z4789 Encounter for other orthopedic aftercare: Secondary | ICD-10-CM | POA: Diagnosis not present

## 2018-04-07 ENCOUNTER — Telehealth (INDEPENDENT_AMBULATORY_CARE_PROVIDER_SITE_OTHER): Payer: Self-pay | Admitting: Specialist

## 2018-04-08 ENCOUNTER — Ambulatory Visit (INDEPENDENT_AMBULATORY_CARE_PROVIDER_SITE_OTHER): Payer: Medicare HMO | Admitting: Specialist

## 2018-04-08 ENCOUNTER — Encounter (INDEPENDENT_AMBULATORY_CARE_PROVIDER_SITE_OTHER): Payer: Self-pay | Admitting: Specialist

## 2018-04-08 ENCOUNTER — Ambulatory Visit (INDEPENDENT_AMBULATORY_CARE_PROVIDER_SITE_OTHER): Payer: Medicare HMO

## 2018-04-08 VITALS — BP 142/74 | HR 97 | Temp 98.4°F | Ht 65.0 in | Wt 177.0 lb

## 2018-04-08 DIAGNOSIS — Z4889 Encounter for other specified surgical aftercare: Secondary | ICD-10-CM

## 2018-04-08 DIAGNOSIS — M48062 Spinal stenosis, lumbar region with neurogenic claudication: Secondary | ICD-10-CM | POA: Diagnosis not present

## 2018-04-08 MED ORDER — METHOCARBAMOL 500 MG PO TABS: 500.0000 mg | ORAL_TABLET | Freq: Three times a day (TID) | ORAL | 0 refills | Status: DC | PRN

## 2018-04-08 NOTE — Progress Notes (Signed)
67 year old white female returns office for postop wound drainage.  Patient status post right transforaminal lumbar interbody fusion L1-2, L2-3 with posterior fusion T10-L2 Mar 04, 2018.  When I saw patient for her first postop appointment her wound looks really good.  Staples removed at that time and Steri-Strips applied.  We are contacted by the skilled facility on Friday and advised that she had some drainage from the wound.  Patient denies drainage that is getting onto her clothes or bed sheets.  Not complaining of any increased pain.  No fever chills.  Overall she is still doing extremely well and please up to this point.  Compliant with wearing brace.  Exam Today patient seen and examined with Dr. Otelia SergeantNitka.  Very distal part of her incision she has an area measuring may be about a half a centimeter that is not completely closed.  This area is very superficial.  Slight bleeding.  No purulent drainage.  Dr. Otelia SergeantNitka did probe the area and again this was superficial and no signs of infection.  Dr. Otelia SergeantNitka applied Silvercel into the wound and Band-Aid was applied.  Patient is neurologically intact.  Ambulating well.  X-rays Again show that hardware is intact.  No complicating features.   Plan We will have rehab center due to daily wound checks and dressing changes with Band-Aids.  Antibiotic not indicated.  Refilled Robaxin prescription.  Follow with Dr. Otelia SergeantNitka in 1 week for wound check.  All questions answered.

## 2018-04-18 ENCOUNTER — Encounter (INDEPENDENT_AMBULATORY_CARE_PROVIDER_SITE_OTHER): Payer: Self-pay | Admitting: Specialist

## 2018-04-18 ENCOUNTER — Ambulatory Visit (INDEPENDENT_AMBULATORY_CARE_PROVIDER_SITE_OTHER): Payer: Medicare HMO | Admitting: Specialist

## 2018-04-18 VITALS — BP 158/91 | HR 92 | Ht 65.0 in | Wt 177.0 lb

## 2018-04-18 DIAGNOSIS — Z981 Arthrodesis status: Secondary | ICD-10-CM

## 2018-04-18 NOTE — Progress Notes (Signed)
67 year old female comes in for wound check.  Says that she is doing well.  Has not had any drainage.    Exam Wound is looking better.  Skin edges at that small area of the incision have not completely closed but there is no drainage.  No signs of infection.  Plan She will continue lumbar brace.  Follow-up in 2 weeks for recheck.  If she has any drainage she will call and let us know and return immediately.

## 2018-05-01 ENCOUNTER — Ambulatory Visit (INDEPENDENT_AMBULATORY_CARE_PROVIDER_SITE_OTHER): Payer: Medicare HMO | Admitting: Specialist

## 2018-05-01 ENCOUNTER — Ambulatory Visit (INDEPENDENT_AMBULATORY_CARE_PROVIDER_SITE_OTHER): Payer: Self-pay

## 2018-05-01 ENCOUNTER — Encounter (INDEPENDENT_AMBULATORY_CARE_PROVIDER_SITE_OTHER): Payer: Self-pay | Admitting: Specialist

## 2018-05-01 VITALS — BP 149/69 | HR 83 | Ht 65.0 in | Wt 177.0 lb

## 2018-05-01 DIAGNOSIS — Z981 Arthrodesis status: Secondary | ICD-10-CM

## 2018-05-01 MED ORDER — GABAPENTIN 300 MG PO CAPS: 300.0000 mg | ORAL_CAPSULE | Freq: Three times a day (TID) | ORAL | 3 refills | Status: DC

## 2018-05-01 NOTE — Progress Notes (Signed)
Post-Op Visit Note   Patient: [name]           Date of Birth: [birth year]           MRN: [id number] Visit Date: 05/01/2018 PCP: Mirna Mires, MD   Assessment & Plan: 2 months post  Extension of the lumbar fusion to the T10 level for junctional stenosis and DDD with claudication.  Chief Complaint:  Chief Complaint  Patient presents with  . Lower Back - Wound Check, Routine Post Op   Visit Diagnoses:  1. S/P lumbar fusion   Incision is healing good no erythrema  Plan: Avoid frequent bending and stooping  No lifting greater than 10 lbs. May use ice or moist heat for pain. Weight loss is of benefit. Handicap license is approved.    Follow-Up Instructions: No follow-ups on file.   Orders:  Orders Placed This Encounter  Procedures  . XR Lumbar Spine 2-3 Views   No orders of the defined types were placed in this encounter.   Imaging: No results found.  PMFS History: Patient Active Problem List   Diagnosis Date Noted  . Acute blood loss as cause of postoperative anemia 03/07/2018    Priority: High    Class: Acute  . Spinal stenosis, lumbar region with neurogenic claudication 03/04/2018    Priority: High    Class: Chronic  . Multilevel degenerative disc disease 03/04/2018    Priority: High    Class: Chronic  . Forestier's disease of thoracolumbar region 03/04/2018    Priority: High    Class: Chronic  . Fusion of spine of thoracolumbar region 03/04/2018  . Pain in right hip 03/18/2017  . Stiffness of joint, not elsewhere classified, other specified site 08/04/2013  . Leg weakness, bilateral 08/04/2013  . CHEST PAIN UNSPECIFIED 05/10/2009  . BENIGN POSITIONAL VERTIGO 07/30/2007  . VARICOSE VEINS, LOWER EXTREMITIES 07/30/2007  . SCIATICA, CHRONIC 07/30/2007  . DIABETES MELLITUS, TYPE II 11/18/2006  . HYPERLIPIDEMIA 11/18/2006  . MIGRAINE HEADACHE 11/18/2006  . LESION, SCIATIC NERVE 11/18/2006  . HYPERTENSION 11/18/2006  . HYPOTENSION,  ORTHOSTATIC 11/18/2006  . BRONCHITIS NOS 11/18/2006  . Other emphysema (HCC) 11/18/2006  . ASTHMA 11/18/2006  . OSTEOARTHRITIS 11/18/2006  . LOW BACK PAIN 11/18/2006  . VERTIGO 11/18/2006   Past Medical History:  Diagnosis Date  . Asthma   . Bronchitis   . Diabetes mellitus   . Hyperlipidemia   . Hypertension   . Low back pain   . Migraine   . Osteoarthritis   . Sciatica   . Varicose veins   . Vertigo     Family History  Problem Relation Age of Onset  . Lung cancer Mother   . Stroke Father   . Heart failure Sister   . Heart failure Sister     Past Surgical History:  Procedure Laterality Date  . ABDOMINAL HYSTERECTOMY    . CARPAL TUNNEL RELEASE    . CESAREAN SECTION    . INGUINAL HERNIA REPAIR    . LUMBAR FUSION    . POSTERIOR LUMBAR FUSION 4 LEVEL N/A 03/04/2018   Procedure: Right transforaminal lumbar interbody fusion L1-2, L2-3 Posterior fusion T10, T11, T12, L1, L2 with T 11, T12 pedicle screws, superior sublaminar hooks T10, local bone graft, allograft cancellous chips Vivigen;  Surgeon: Kerrin Champagne, MD;  Location: [facility] OR;  Service: Orthopedics;  Laterality: N/A;   Social History   Occupational History  . Occupation: Disabled    Employer: UNEMPLOYED  Tobacco Use  .  Smoking status: Never Smoker  . Smokeless tobacco: Never Used  Substance and Sexual Activity  . Alcohol use: No    Alcohol/week: 0.0 oz  . Drug use: No  . Sexual activity: Not on file

## 2018-05-01 NOTE — Patient Instructions (Signed)
Plan:Avoid frequent bending and stooping  No lifting greater than 10 lbs. May use ice or moist heat for pain. Weight loss is of benefit. Handicap license is approved. 

## 2018-05-14 DIAGNOSIS — E1165 Type 2 diabetes mellitus with hyperglycemia: Secondary | ICD-10-CM | POA: Diagnosis not present

## 2018-05-14 DIAGNOSIS — I10 Essential (primary) hypertension: Secondary | ICD-10-CM | POA: Diagnosis not present

## 2018-05-14 DIAGNOSIS — Z6830 Body mass index (BMI) 30.0-30.9, adult: Secondary | ICD-10-CM | POA: Diagnosis not present

## 2018-05-28 ENCOUNTER — Ambulatory Visit (INDEPENDENT_AMBULATORY_CARE_PROVIDER_SITE_OTHER): Payer: Medicare HMO | Admitting: Specialist

## 2018-05-28 ENCOUNTER — Encounter (INDEPENDENT_AMBULATORY_CARE_PROVIDER_SITE_OTHER): Payer: Self-pay | Admitting: Specialist

## 2018-05-28 VITALS — BP 137/79 | HR 104 | Ht 65.0 in | Wt 170.0 lb

## 2018-05-28 DIAGNOSIS — M4326 Fusion of spine, lumbar region: Secondary | ICD-10-CM

## 2018-05-28 NOTE — Patient Instructions (Addendum)
Avoid frequent bending and stooping  No lifting greater than 10 lbs. May use ice or moist heat for pain. Weight loss is of benefit. Handicap license is approved.  [facility] to strengthen the right leg thigh and foot.

## 2018-05-28 NOTE — Progress Notes (Signed)
Post-Op Visit Note   Patient: [name]           Date of Birth: [birth year]           MRN: [id number] Visit Date: 05/28/2018 PCP: Mirna MiresHill, Gerald, MD   Assessment & Plan 2 1/2 months post extension of fusion to T10.:  Chief Complaint:  Chief Complaint  Patient presents with  . Lower Back - Routine Post Op   Visit Diagnoses: No diagnosis found.  Plan: Avoid frequent bending and stooping  No lifting greater than 10 lbs. May use ice or moist heat for pain. Weight loss is of benefit. Handicap license is approved.   Follow-Up Instructions: No follow-ups on file.   Orders:  No orders of the defined types were placed in this encounter.  No orders of the defined types were placed in this encounter.   Imaging: No results found.  PMFS History: Patient Active Problem List   Diagnosis Date Noted  . Acute blood loss as cause of postoperative anemia 03/07/2018    Priority: High    Class: Acute  . Spinal stenosis, lumbar region with neurogenic claudication 03/04/2018    Priority: High    Class: Chronic  . Multilevel degenerative disc disease 03/04/2018    Priority: High    Class: Chronic  . Forestier's disease of thoracolumbar region 03/04/2018    Priority: High    Class: Chronic  . Fusion of spine of thoracolumbar region 03/04/2018  . Pain in right hip 03/18/2017  . Stiffness of joint, not elsewhere classified, other specified site 08/04/2013  . Leg weakness, bilateral 08/04/2013  . CHEST PAIN UNSPECIFIED 05/10/2009  . BENIGN POSITIONAL VERTIGO 07/30/2007  . VARICOSE VEINS, LOWER EXTREMITIES 07/30/2007  . SCIATICA, CHRONIC 07/30/2007  . DIABETES MELLITUS, TYPE II 11/18/2006  . HYPERLIPIDEMIA 11/18/2006  . MIGRAINE HEADACHE 11/18/2006  . LESION, SCIATIC NERVE 11/18/2006  . HYPERTENSION 11/18/2006  . HYPOTENSION, ORTHOSTATIC 11/18/2006  . BRONCHITIS NOS 11/18/2006  . Other emphysema (HCC) 11/18/2006  . ASTHMA 11/18/2006  . OSTEOARTHRITIS 11/18/2006  .  LOW BACK PAIN 11/18/2006  . VERTIGO 11/18/2006   Past Medical History:  Diagnosis Date  . Asthma   . Bronchitis   . Diabetes mellitus   . Hyperlipidemia   . Hypertension   . Low back pain   . Migraine   . Osteoarthritis   . Sciatica   . Varicose veins   . Vertigo     Family History  Problem Relation Age of Onset  . Lung cancer Mother   . Stroke Father   . Heart failure Sister   . Heart failure Sister     Past Surgical History:  Procedure Laterality Date  . ABDOMINAL HYSTERECTOMY    . CARPAL TUNNEL RELEASE    . CESAREAN SECTION    . INGUINAL HERNIA REPAIR    . LUMBAR FUSION    . POSTERIOR LUMBAR FUSION 4 LEVEL N/A 03/04/2018   Procedure: Right transforaminal lumbar interbody fusion L1-2, L2-3 Posterior fusion T10, T11, T12, L1, L2 with T 11, T12 pedicle screws, superior sublaminar hooks T10, local bone graft, allograft cancellous chips Vivigen;  Surgeon: Kerrin ChampagneNitka, Jetaun Colbath E, MD;  Location: [facility] OR;  Service: Orthopedics;  Laterality: N/A;   Social History   Occupational History  . Occupation: Disabled    Employer: UNEMPLOYED  Tobacco Use  . Smoking status: Never Smoker  . Smokeless tobacco: Never Used  Substance and Sexual Activity  . Alcohol use: No    Alcohol/week: 0.0 standard  drinks  . Drug use: No  . Sexual activity: Not on file

## 2018-06-03 DIAGNOSIS — M2560 Stiffness of unspecified joint, not elsewhere classified: Secondary | ICD-10-CM | POA: Diagnosis not present

## 2018-06-03 DIAGNOSIS — M545 Low back pain: Secondary | ICD-10-CM | POA: Diagnosis not present

## 2018-06-03 DIAGNOSIS — M62552 Muscle wasting and atrophy, not elsewhere classified, left thigh: Secondary | ICD-10-CM | POA: Diagnosis not present

## 2018-06-03 DIAGNOSIS — M62551 Muscle wasting and atrophy, not elsewhere classified, right thigh: Secondary | ICD-10-CM | POA: Diagnosis not present

## 2018-06-04 DIAGNOSIS — Z6830 Body mass index (BMI) 30.0-30.9, adult: Secondary | ICD-10-CM | POA: Diagnosis not present

## 2018-06-04 DIAGNOSIS — E118 Type 2 diabetes mellitus with unspecified complications: Secondary | ICD-10-CM | POA: Diagnosis not present

## 2018-06-06 DIAGNOSIS — M62551 Muscle wasting and atrophy, not elsewhere classified, right thigh: Secondary | ICD-10-CM | POA: Diagnosis not present

## 2018-06-06 DIAGNOSIS — M62552 Muscle wasting and atrophy, not elsewhere classified, left thigh: Secondary | ICD-10-CM | POA: Diagnosis not present

## 2018-06-06 DIAGNOSIS — M545 Low back pain: Secondary | ICD-10-CM | POA: Diagnosis not present

## 2018-06-06 DIAGNOSIS — M2560 Stiffness of unspecified joint, not elsewhere classified: Secondary | ICD-10-CM | POA: Diagnosis not present

## 2018-06-10 DIAGNOSIS — M545 Low back pain: Secondary | ICD-10-CM | POA: Diagnosis not present

## 2018-06-10 DIAGNOSIS — M62552 Muscle wasting and atrophy, not elsewhere classified, left thigh: Secondary | ICD-10-CM | POA: Diagnosis not present

## 2018-06-10 DIAGNOSIS — M2560 Stiffness of unspecified joint, not elsewhere classified: Secondary | ICD-10-CM | POA: Diagnosis not present

## 2018-06-10 DIAGNOSIS — E119 Type 2 diabetes mellitus without complications: Secondary | ICD-10-CM | POA: Diagnosis not present

## 2018-06-10 DIAGNOSIS — M62551 Muscle wasting and atrophy, not elsewhere classified, right thigh: Secondary | ICD-10-CM | POA: Diagnosis not present

## 2018-06-17 DIAGNOSIS — M545 Low back pain: Secondary | ICD-10-CM | POA: Diagnosis not present

## 2018-06-17 DIAGNOSIS — M62552 Muscle wasting and atrophy, not elsewhere classified, left thigh: Secondary | ICD-10-CM | POA: Diagnosis not present

## 2018-06-17 DIAGNOSIS — M2560 Stiffness of unspecified joint, not elsewhere classified: Secondary | ICD-10-CM | POA: Diagnosis not present

## 2018-06-17 DIAGNOSIS — M62551 Muscle wasting and atrophy, not elsewhere classified, right thigh: Secondary | ICD-10-CM | POA: Diagnosis not present

## 2018-06-20 DIAGNOSIS — M2560 Stiffness of unspecified joint, not elsewhere classified: Secondary | ICD-10-CM | POA: Diagnosis not present

## 2018-06-20 DIAGNOSIS — M545 Low back pain: Secondary | ICD-10-CM | POA: Diagnosis not present

## 2018-06-20 DIAGNOSIS — M62551 Muscle wasting and atrophy, not elsewhere classified, right thigh: Secondary | ICD-10-CM | POA: Diagnosis not present

## 2018-06-20 DIAGNOSIS — M62552 Muscle wasting and atrophy, not elsewhere classified, left thigh: Secondary | ICD-10-CM | POA: Diagnosis not present

## 2018-06-23 DIAGNOSIS — R69 Illness, unspecified: Secondary | ICD-10-CM | POA: Diagnosis not present

## 2018-06-24 DIAGNOSIS — M62551 Muscle wasting and atrophy, not elsewhere classified, right thigh: Secondary | ICD-10-CM | POA: Diagnosis not present

## 2018-06-24 DIAGNOSIS — M62552 Muscle wasting and atrophy, not elsewhere classified, left thigh: Secondary | ICD-10-CM | POA: Diagnosis not present

## 2018-06-24 DIAGNOSIS — M545 Low back pain: Secondary | ICD-10-CM | POA: Diagnosis not present

## 2018-06-24 DIAGNOSIS — M2560 Stiffness of unspecified joint, not elsewhere classified: Secondary | ICD-10-CM | POA: Diagnosis not present

## 2018-06-27 DIAGNOSIS — M545 Low back pain: Secondary | ICD-10-CM | POA: Diagnosis not present

## 2018-06-27 DIAGNOSIS — M62552 Muscle wasting and atrophy, not elsewhere classified, left thigh: Secondary | ICD-10-CM | POA: Diagnosis not present

## 2018-06-27 DIAGNOSIS — M62551 Muscle wasting and atrophy, not elsewhere classified, right thigh: Secondary | ICD-10-CM | POA: Diagnosis not present

## 2018-06-27 DIAGNOSIS — M2560 Stiffness of unspecified joint, not elsewhere classified: Secondary | ICD-10-CM | POA: Diagnosis not present

## 2018-07-01 DIAGNOSIS — M2560 Stiffness of unspecified joint, not elsewhere classified: Secondary | ICD-10-CM | POA: Diagnosis not present

## 2018-07-01 DIAGNOSIS — M545 Low back pain: Secondary | ICD-10-CM | POA: Diagnosis not present

## 2018-07-01 DIAGNOSIS — M62551 Muscle wasting and atrophy, not elsewhere classified, right thigh: Secondary | ICD-10-CM | POA: Diagnosis not present

## 2018-07-01 DIAGNOSIS — M62552 Muscle wasting and atrophy, not elsewhere classified, left thigh: Secondary | ICD-10-CM | POA: Diagnosis not present

## 2018-07-04 DIAGNOSIS — M62551 Muscle wasting and atrophy, not elsewhere classified, right thigh: Secondary | ICD-10-CM | POA: Diagnosis not present

## 2018-07-04 DIAGNOSIS — M2560 Stiffness of unspecified joint, not elsewhere classified: Secondary | ICD-10-CM | POA: Diagnosis not present

## 2018-07-04 DIAGNOSIS — M62552 Muscle wasting and atrophy, not elsewhere classified, left thigh: Secondary | ICD-10-CM | POA: Diagnosis not present

## 2018-07-04 DIAGNOSIS — M545 Low back pain: Secondary | ICD-10-CM | POA: Diagnosis not present

## 2018-07-05 DIAGNOSIS — E119 Type 2 diabetes mellitus without complications: Secondary | ICD-10-CM | POA: Diagnosis not present

## 2018-07-07 DIAGNOSIS — M62552 Muscle wasting and atrophy, not elsewhere classified, left thigh: Secondary | ICD-10-CM | POA: Diagnosis not present

## 2018-07-07 DIAGNOSIS — M2560 Stiffness of unspecified joint, not elsewhere classified: Secondary | ICD-10-CM | POA: Diagnosis not present

## 2018-07-07 DIAGNOSIS — M62551 Muscle wasting and atrophy, not elsewhere classified, right thigh: Secondary | ICD-10-CM | POA: Diagnosis not present

## 2018-07-07 DIAGNOSIS — M545 Low back pain: Secondary | ICD-10-CM | POA: Diagnosis not present

## 2018-07-09 DIAGNOSIS — M62552 Muscle wasting and atrophy, not elsewhere classified, left thigh: Secondary | ICD-10-CM | POA: Diagnosis not present

## 2018-07-09 DIAGNOSIS — M2560 Stiffness of unspecified joint, not elsewhere classified: Secondary | ICD-10-CM | POA: Diagnosis not present

## 2018-07-09 DIAGNOSIS — M545 Low back pain: Secondary | ICD-10-CM | POA: Diagnosis not present

## 2018-07-09 DIAGNOSIS — M62551 Muscle wasting and atrophy, not elsewhere classified, right thigh: Secondary | ICD-10-CM | POA: Diagnosis not present

## 2018-07-10 ENCOUNTER — Ambulatory Visit (INDEPENDENT_AMBULATORY_CARE_PROVIDER_SITE_OTHER): Payer: Medicare HMO | Admitting: Specialist

## 2018-07-10 ENCOUNTER — Ambulatory Visit (INDEPENDENT_AMBULATORY_CARE_PROVIDER_SITE_OTHER): Payer: Medicare HMO

## 2018-07-10 ENCOUNTER — Encounter (INDEPENDENT_AMBULATORY_CARE_PROVIDER_SITE_OTHER): Payer: Self-pay | Admitting: Specialist

## 2018-07-10 VITALS — BP 140/73 | HR 85 | Ht 65.0 in | Wt 170.0 lb

## 2018-07-10 DIAGNOSIS — R609 Edema, unspecified: Secondary | ICD-10-CM

## 2018-07-10 DIAGNOSIS — M4326 Fusion of spine, lumbar region: Secondary | ICD-10-CM

## 2018-07-10 MED ORDER — ZOLPIDEM TARTRATE ER 6.25 MG PO TBCR: 6.2500 mg | EXTENDED_RELEASE_TABLET | Freq: Every evening | ORAL | 0 refills | Status: DC | PRN

## 2018-07-10 NOTE — Progress Notes (Addendum)
Office Visit Note   Patient: [name]           Date of Birth: 03/27/1951           MRN: [id number] Visit Date: 07/10/2018              Requested by: Mirna Mires, MD [address] [place], [place] [address] PCP: Mirna Mires, MD   Assessment & Plan: Visit Diagnoses:  1. Fusion of spine of lumbar region     Plan: Avoid frequent bending and stooping  No lifting greater than 10 lbs. May use ice or moist heat for pain. Weight loss is of benefit. Best medication for lumbar disc disease is arthritis medications like tylenol for pain.  Exercise is important to improve your indurance and does allow people to function better inspite of back pain. You have extra fluid on the legs and this can be due to hypertension, low protein from liver disease or heart disease. Venous insufficiency can be a cause of swelling in the legs but you need to rule out the  Heart and kidneys and hypertension before assuming it is due to venous insufficency.  Call your cardiologist to ask for an evaluation.  Follow-Up Instructions: No follow-ups on file.   Orders:  Orders Placed This Encounter  Procedures  . XR Lumbar Spine 2-3 Views   No orders of the defined types were placed in this encounter.     Procedures: No procedures performed   Clinical Data: No additional findings.   Subjective: Chief Complaint  Patient presents with  . Lower Back - Follow-up    HPI  Review of Systems   Objective: Vital Signs: BP 140/73 (BP Location: Left Arm, Patient Position: Sitting)   Pulse 85   Ht 5\' 5"  (1.651 m)   Wt 170 lb (77.1 kg)   BMI 28.29 kg/m   Physical Exam  Back Exam   Tenderness  The patient is experiencing tenderness in the lumbar.  Range of Motion  Extension: normal  Flexion: abnormal  Lateral bend right: abnormal  Lateral bend left: abnormal  Rotation right: abnormal  Rotation left: abnormal   Muscle Strength  The patient has normal back strength. Right  Quadriceps:  5/5  Left Quadriceps:  5/5  Right Hamstrings:  5/5  Left Hamstrings:  5/5   Tests  Straight leg raise right: negative Straight leg raise left: negative  Reflexes  Patellar: Hyporeflexic Achilles: Hyporeflexic Babinski's sign: normal   Other  Toe walk: normal Heel walk: normal Sensation: decreased Gait: normal  Erythema: no back redness Scars: present  Comments:  Complains of area of pressure related to PT use of a elastic wrap. Clinically she has pitting edema that is present in both legs to the upper 1/4 of the leg below the knee. In the past she has been on lasix and HCTZ Presently she is not of a diuretic.       Specialty Comments:  No specialty comments available.  Imaging: No results found.   PMFS History: Patient Active Problem List   Diagnosis Date Noted  . Acute blood loss as cause of postoperative anemia 03/07/2018    Priority: High    Class: Acute  . Spinal stenosis, lumbar region with neurogenic claudication 03/04/2018    Priority: High    Class: Chronic  . Multilevel degenerative disc disease 03/04/2018    Priority: High    Class: Chronic  . Forestier's disease of thoracolumbar region 03/04/2018    Priority:  High    Class: Chronic  . Fusion of spine of thoracolumbar region 03/04/2018  . Pain in right hip 03/18/2017  . Stiffness of joint, not elsewhere classified, other specified site 08/04/2013  . Leg weakness, bilateral 08/04/2013  . CHEST PAIN UNSPECIFIED 05/10/2009  . BENIGN POSITIONAL VERTIGO 07/30/2007  . VARICOSE VEINS, LOWER EXTREMITIES 07/30/2007  . SCIATICA, CHRONIC 07/30/2007  . DIABETES MELLITUS, TYPE II 11/18/2006  . HYPERLIPIDEMIA 11/18/2006  . MIGRAINE HEADACHE 11/18/2006  . LESION, SCIATIC NERVE 11/18/2006  . HYPERTENSION 11/18/2006  . HYPOTENSION, ORTHOSTATIC 11/18/2006  . BRONCHITIS NOS 11/18/2006  . Other emphysema (HCC) 11/18/2006  . ASTHMA 11/18/2006  . OSTEOARTHRITIS 11/18/2006  . LOW BACK PAIN  11/18/2006  . VERTIGO 11/18/2006   Past Medical History:  Diagnosis Date  . Asthma   . Bronchitis   . Diabetes mellitus   . Hyperlipidemia   . Hypertension   . Low back pain   . Migraine   . Osteoarthritis   . Sciatica   . Varicose veins   . Vertigo     Family History  Problem Relation Age of Onset  . Lung cancer Mother   . Stroke Father   . Heart failure Sister   . Heart failure Sister     Past Surgical History:  Procedure Laterality Date  . ABDOMINAL HYSTERECTOMY    . CARPAL TUNNEL RELEASE    . CESAREAN SECTION    . INGUINAL HERNIA REPAIR    . LUMBAR FUSION    . POSTERIOR LUMBAR FUSION 4 LEVEL N/A 03/04/2018   Procedure: Right transforaminal lumbar interbody fusion L1-2, L2-3 Posterior fusion T10, T11, T12, L1, L2 with T 11, T12 pedicle screws, superior sublaminar hooks T10, local bone graft, allograft cancellous chips Vivigen;  Surgeon: Kerrin Champagne, MD;  Location: [facility] OR;  Service: Orthopedics;  Laterality: N/A;   Social History   Occupational History  . Occupation: Disabled    Employer: UNEMPLOYED  Tobacco Use  . Smoking status: Never Smoker  . Smokeless tobacco: Never Used  Substance and Sexual Activity  . Alcohol use: No    Alcohol/week: 0.0 standard drinks  . Drug use: No  . Sexual activity: Not on file

## 2018-07-10 NOTE — Patient Instructions (Signed)
Avoid frequent bending and stooping  No lifting greater than 10 lbs. May use ice or moist heat for pain. Weight loss is of benefit. Best medication for lumbar disc disease is arthritis medications like tylenol for pain.  Exercise is important to improve your indurance and does allow people to function better inspite of back pain. You have extra fluid on the legs and this can be due to hypertension, low protein from liver disease or heart disease. Venous insufficiency can be a cause of swelling in the legs but you need to rule out the  Heart and kidneys and hypertension before assuming it is due to venous insufficency.  Call your cardiologist to ask for an evaluation.

## 2018-07-14 DIAGNOSIS — M62552 Muscle wasting and atrophy, not elsewhere classified, left thigh: Secondary | ICD-10-CM | POA: Diagnosis not present

## 2018-07-14 DIAGNOSIS — M62551 Muscle wasting and atrophy, not elsewhere classified, right thigh: Secondary | ICD-10-CM | POA: Diagnosis not present

## 2018-07-14 DIAGNOSIS — M545 Low back pain: Secondary | ICD-10-CM | POA: Diagnosis not present

## 2018-07-14 DIAGNOSIS — M2560 Stiffness of unspecified joint, not elsewhere classified: Secondary | ICD-10-CM | POA: Diagnosis not present

## 2018-08-06 DIAGNOSIS — R9431 Abnormal electrocardiogram [ECG] [EKG]: Secondary | ICD-10-CM | POA: Insufficient documentation

## 2018-08-06 NOTE — Progress Notes (Signed)
Cardiology Office Note    Date:  08/11/2018   ID:  [name], DOB 19-Apr-1951, MRN [id number]  PCP:  Mirna Mires, MD  Cardiologist: No primary care provider on file. EPS: None  No chief complaint on file.   History of Present Illness:  [name] is a 67 y.o. female who was seen by Dr. Purvis Sheffield for the first time 07/2017 for preoperative evaluation before undergoing spinal surgery.  She has history of hypertension, IDDM, HLD normal nuclear stress test 2010.  EKG that day showed normal sinus rhythm with late R wave transition and T wave inversion in leads I and aVL suggestive of high lateral ischemia.  She was asymptomatic.  Nuclear stress test 08/2017 was low risk study EF 55 to 65% with small mild intensity reversible anterior defect may represent mild ischemia versus breast attenuation.  She proceeded to have surgery.  Patient added on to my schedule today for complaints of leg swelling.Patient says her right leg has been swelling for about 1 month off and on. No dyspnea.  She admits to eating a lot of salt.  She puts it on everything including apples.  Chest is sore and tight almost all the time. Hurts to touch her stomach and epigastric area. Was bad the other night and helped to prop up on a pill. Choking on food and has to wash everything down with water or soda.  Yesterday before church she tried eating chicken and green beans.  She had severe epigastric pain and had to wash it down with soda.  The pain lasted most of the day.  Eating has made everything worse.  She denies exertional chest pain or shortness of breath.  Past Medical History:  Diagnosis Date  . Asthma   . Bronchitis   . Diabetes mellitus   . Hyperlipidemia   . Hypertension   . Low back pain   . Migraine   . Osteoarthritis   . Sciatica   . Varicose veins   . Vertigo     Past Surgical History:  Procedure Laterality Date  . ABDOMINAL HYSTERECTOMY    . CARPAL TUNNEL RELEASE    .  CESAREAN SECTION    . INGUINAL HERNIA REPAIR    . LUMBAR FUSION    . POSTERIOR LUMBAR FUSION 4 LEVEL N/A 03/04/2018   Procedure: Right transforaminal lumbar interbody fusion L1-2, L2-3 Posterior fusion T10, T11, T12, L1, L2 with T 11, T12 pedicle screws, superior sublaminar hooks T10, local bone graft, allograft cancellous chips Vivigen;  Surgeon: Kerrin Champagne, MD;  Location: [facility] OR;  Service: Orthopedics;  Laterality: N/A;    Current Medications: Current Meds  Medication Sig  . Alogliptin-metFORMIN HCl 12.02-999 MG TABS Take 1 tablet by mouth 2 (two) times daily.   Marland Kitchen aspirin EC 81 MG tablet Take 81 mg by mouth daily.  Marland Kitchen gabapentin (NEURONTIN) 300 MG capsule Take 1 capsule (300 mg total) by mouth 3 (three) times daily.  . insulin NPH-insulin regular (NOVOLIN 70/30) (70-30) 100 UNIT/ML injection Inject 66 Units into the skin 2 (two) times daily.   Marland Kitchen losartan (COZAAR) 50 MG tablet Take 50 mg by mouth daily.  Letta Pate VERIO test strip See admin instructions.  Tana Conch INSULIN SYRINGE 1ML/31G 31G X 5/16" 1 ML MISC USE 1 TWICE DAILY AS DIRECTED  . simvastatin (ZOCOR) 20 MG tablet Take 20 mg by mouth at bedtime.    Marland Kitchen zolpidem (AMBIEN CR) 6.25 MG CR tablet Take 1 tablet (6.25 mg  total) by mouth at bedtime as needed for sleep.     Allergies:   Patient has no known allergies.   Social History   Socioeconomic History  . Marital status: Single    Spouse name: Not on file  . Number of children: Not on file  . Years of education: 12th   . Highest education level: Not on file  Occupational History  . Occupation: Disabled    Associate Professor: UNEMPLOYED  Social Needs  . Financial resource strain: Not on file  . Food insecurity:    Worry: Not on file    Inability: Not on file  . Transportation needs:    Medical: Not on file    Non-medical: Not on file  Tobacco Use  . Smoking status: Never Smoker  . Smokeless tobacco: Never Used  Substance and Sexual Activity  . Alcohol use: No     Alcohol/week: 0.0 standard drinks  . Drug use: No  . Sexual activity: Not on file  Lifestyle  . Physical activity:    Days per week: Not on file    Minutes per session: Not on file  . Stress: Not on file  Relationships  . Social connections:    Talks on phone: Not on file    Gets together: Not on file    Attends religious service: Not on file    Active member of club or organization: Not on file    Attends meetings of clubs or organizations: Not on file    Relationship status: Not on file  Other Topics Concern  . Not on file  Social History Narrative   No caffeine use      Family History:  The patient's family history includes Heart failure in her sister and sister; Lung cancer in her mother; Stroke in her father.   ROS:   Please see the history of present illness.    Review of Systems  Constitution: Negative.  HENT: Negative.   Eyes: Negative.   Cardiovascular: Positive for chest pain and leg swelling.  Respiratory: Negative.   Hematologic/Lymphatic: Negative.   Musculoskeletal: Negative.  Negative for joint pain.  Gastrointestinal: Positive for abdominal pain, dysphagia and heartburn.  Genitourinary: Negative.   Neurological: Negative.    All other systems reviewed and are negative.   PHYSICAL EXAM:   VS:  BP 138/60   Pulse 85   Ht 5\' 5"  (1.651 m)   Wt 182 lb 6.4 oz (82.7 kg)   SpO2 96%   BMI 30.35 kg/m   Physical Exam  GEN: Well nourished, well developed, in no acute distress  Neck: Bilateral carotid bruit right louder than left no JVD,  or masses Cardiac:RRR; positive S4, 1/6 systolic murmur at the left sternal border Respiratory:  clear to auscultation bilaterally, normal work of breathing GI: soft, nontender, nondistended, + BS Ext: without cyanosis, clubbing, or edema, Good distal pulses bilaterally Neuro:  Alert and Oriented x 3 Psych: euthymic mood, full affect  Wt Readings from Last 3 Encounters:  08/11/18 182 lb 6.4 oz (82.7 kg)  07/10/18 170 lb  (77.1 kg)  05/28/18 170 lb (77.1 kg)      Studies/Labs Reviewed:   EKG:  EKG is  ordered today.  The ekg ordered today demonstrates normal sinus rhythm normal EKG  Recent Labs: 02/28/2018: ALT 16 03/06/2018: BUN 11; Creatinine, Ser 0.79; Hemoglobin 9.2; Platelets 172; Potassium 4.3; Sodium 137   Lipid Panel    Component Value Date/Time   CHOL 157 11/03/2009   TRIG 171  11/03/2009   HDL 29 11/03/2009   LDLCALC 94 11/03/2009    Additional studies/ records that were reviewed today include:  Nuclear stress test 08/2017   There was no ST segment deviation noted during stress.  This is a low risk study.  The left ventricular ejection fraction is normal (55-65%).  Small mild intensity reversible anterior defect. May represent mild ischemia vs differences in breast attenuation. Either findings supports low risk.     ASSESSMENT:    1. Abnormal EKG   2. Leg swelling   3. Dysphagia, unspecified type   4. Bilateral carotid bruits      PLAN:  In order of problems listed above:  History of abnormal EKG with nuclear stress test that was low risk 08/2017.  EKG normal today.  Essential hypertension blood pressure controlled with losartan  Leg swelling off and on.  Suspected secondary to her excessive sodium intake.  Will check 2D echo to assess LV function and diastolic function.  2 g sodium diet.  Follow-up with me in 2 to 3 weeks.  Hyperlipidemia on Zocor  Dysphasia with food as well as epigastric pain.  Refer to GI.  Bilateral carotid bruits check carotid Dopplers  Medication Adjustments/Labs and Tests Ordered: Current medicines are reviewed at length with the patient today.  Concerns regarding medicines are outlined above.  Medication changes, Labs and Tests ordered today are listed in the Patient Instructions below. Patient Instructions  Medication Instructions:  Your physician recommends that you continue on your current medications as directed. Please refer to the  Current Medication list given to you today.  If you need a refill on your cardiac medications before your next appointment, please call your pharmacy.   Lab work: NONE  If you have labs (blood work) drawn today and your tests are completely normal, you will receive your results only by: Marland Kitchen MyChart Message (if you have MyChart) OR . A paper copy in the mail If you have any lab test that is abnormal or we need to change your treatment, we will call you to review the results.  Testing/Procedures: Your physician has requested that you have an echocardiogram. Echocardiography is a painless test that uses sound waves to create images of your heart. It provides your doctor with information about the size and shape of your heart and how well your heart's chambers and valves are working. This procedure takes approximately one hour. There are no restrictions for this procedure.  Your physician has requested that you have a carotid duplex. This test is an ultrasound of the carotid arteries in your neck. It looks at blood flow through these arteries that supply the brain with blood. Allow one hour for this exam. There are no restrictions or special instructions.   Follow-Up: At [facility], you and your health needs are our priority.  As part of our continuing mission to provide you with exceptional heart care, we have created designated Provider Care Teams.  These Care Teams include your primary Cardiologist (physician) and Advanced Practice Providers (APPs -  Physician Assistants and Nurse Practitioners) who all work together to provide you with the care you need, when you need it. You will need a follow up appointment in 3 weeks.  Please call our office 2 months in advance to schedule this appointment.  You may see No primary care provider on file. or one of the following Advanced Practice Providers on your designated Care Team:   [place], PA-C [facility]) . Jacolyn Reedy, PA-C  (  [place] Office)  Any Other Special Instructions Will Be Listed Below (If Applicable). Thank you for choosing [facility]!    Low-Sodium Eating Plan Sodium, which is an element that makes up salt, helps you maintain a healthy balance of fluids in your body. Too much sodium can increase your blood pressure and cause fluid and waste to be held in your body. Your health care provider or dietitian may recommend following this plan if you have high blood pressure (hypertension), kidney disease, liver disease, or heart failure. Eating less sodium can help lower your blood pressure, reduce swelling, and protect your heart, liver, and kidneys. What are tips for following this plan? General guidelines  Most people on this plan should limit their sodium intake to 1,500-2,000 mg (milligrams) of sodium each day. Reading food labels  The Nutrition Facts label lists the amount of sodium in one serving of the food. If you eat more than one serving, you must multiply the listed amount of sodium by the number of servings.  Choose foods with less than 140 mg of sodium per serving.  Avoid foods with 300 mg of sodium or more per serving. Shopping  Look for lower-sodium products, often labeled as "low-sodium" or "no salt added."  Always check the sodium content even if foods are labeled as "unsalted" or "no salt added".  Buy fresh foods. ? Avoid canned foods and premade or frozen meals. ? Avoid canned, cured, or processed meats  Buy breads that have less than 80 mg of sodium per slice. Cooking  Eat more home-cooked food and less restaurant, buffet, and fast food.  Avoid adding salt when cooking. Use salt-free seasonings or herbs instead of table salt or sea salt. Check with your health care provider or pharmacist before using salt substitutes.  Cook with plant-based oils, such as canola, sunflower, or olive oil. Meal planning  When eating at a restaurant, ask that your food be  prepared with less salt or no salt, if possible.  Avoid foods that contain MSG (monosodium glutamate). MSG is sometimes added to [place] food, bouillon, and some canned foods. What foods are recommended? The items listed may not be a complete list. Talk with your dietitian about what dietary choices are best for you. Grains Low-sodium cereals, including oats, puffed wheat and rice, and shredded wheat. Low-sodium crackers. Unsalted rice. Unsalted pasta. Low-sodium bread. Whole-grain breads and whole-grain pasta. Vegetables Fresh or frozen vegetables. "No salt added" canned vegetables. "No salt added" tomato sauce and paste. Low-sodium or reduced-sodium tomato and vegetable juice. Fruits Fresh, frozen, or canned fruit. Fruit juice. Meats and other protein foods Fresh or frozen (no salt added) meat, poultry, seafood, and fish. Low-sodium canned tuna and salmon. Unsalted nuts. Dried peas, beans, and lentils without added salt. Unsalted canned beans. Eggs. Unsalted nut butters. Dairy Milk. Soy milk. Cheese that is naturally low in sodium, such as ricotta cheese, fresh mozzarella, or Swiss cheese Low-sodium or reduced-sodium cheese. Cream cheese. Yogurt. Fats and oils Unsalted butter. Unsalted margarine with no trans fat. Vegetable oils such as canola or olive oils. Seasonings and other foods Fresh and dried herbs and spices. Salt-free seasonings. Low-sodium mustard and ketchup. Sodium-free salad dressing. Sodium-free light mayonnaise. Fresh or refrigerated horseradish. Lemon juice. Vinegar. Homemade, reduced-sodium, or low-sodium soups. Unsalted popcorn and pretzels. Low-salt or salt-free chips. What foods are not recommended? The items listed may not be a complete list. Talk with your dietitian about what dietary choices are best for you. Grains Instant hot cereals. Bread  stuffing, pancake, and biscuit mixes. Croutons. Seasoned rice or pasta mixes. Noodle soup cups. Boxed or frozen macaroni and  cheese. Regular salted crackers. Self-rising flour. Vegetables Sauerkraut, pickled vegetables, and relishes. Olives. [place] fries. Onion rings. Regular canned vegetables (not low-sodium or reduced-sodium). Regular canned tomato sauce and paste (not low-sodium or reduced-sodium). Regular tomato and vegetable juice (not low-sodium or reduced-sodium). Frozen vegetables in sauces. Meats and other protein foods Meat or fish that is salted, canned, smoked, spiced, or pickled. Bacon, ham, sausage, hotdogs, corned beef, chipped beef, packaged lunch meats, salt pork, jerky, pickled herring, anchovies, regular canned tuna, sardines, salted nuts. Dairy Processed cheese and cheese spreads. Cheese curds. Blue cheese. Feta cheese. String cheese. Regular cottage cheese. Buttermilk. Canned milk. Fats and oils Salted butter. Regular margarine. Ghee. Bacon fat. Seasonings and other foods Onion salt, garlic salt, seasoned salt, table salt, and sea salt. Canned and packaged gravies. Worcestershire sauce. Tartar sauce. Barbecue sauce. Teriyaki sauce. Soy sauce, including reduced-sodium. Steak sauce. Fish sauce. Oyster sauce. Cocktail sauce. Horseradish that you find on the shelf. Regular ketchup and mustard. Meat flavorings and tenderizers. Bouillon cubes. Hot sauce and Tabasco sauce. Premade or packaged marinades. Premade or packaged taco seasonings. Relishes. Regular salad dressings. Salsa. Potato and tortilla chips. Corn chips and puffs. Salted popcorn and pretzels. Canned or dried soups. Pizza. Frozen entrees and pot pies. Summary  Eating less sodium can help lower your blood pressure, reduce swelling, and protect your heart, liver, and kidneys.  Most people on this plan should limit their sodium intake to 1,500-2,000 mg (milligrams) of sodium each day.  Canned, boxed, and frozen foods are high in sodium. Restaurant foods, fast foods, and pizza are also very high in sodium. You also get sodium by adding salt to  food.  Try to cook at home, eat more fresh fruits and vegetables, and eat less fast food, canned, processed, or prepared foods. This information is not intended to replace advice given to you by your health care provider. Make sure you discuss any questions you have with your health care provider. Document Released: 03/23/2002 Document Revised: 09/24/2016 Document Reviewed: 09/24/2016 Elsevier Interactive Patient Education  [address]       Signed, Jacolyn Reedy, [place]  08/11/2018 2:44 PM    [facility] [address] [place], [place], [place]  [address] Phone: [phone]; Fax: [phone]

## 2018-08-11 ENCOUNTER — Ambulatory Visit: Payer: Medicare HMO | Admitting: Physician Assistant

## 2018-08-11 ENCOUNTER — Encounter: Payer: Self-pay | Admitting: Physician Assistant

## 2018-08-11 ENCOUNTER — Telehealth: Payer: Self-pay | Admitting: Physician Assistant

## 2018-08-11 VITALS — BP 138/60 | HR 85 | Ht 65.0 in | Wt 182.4 lb

## 2018-08-11 DIAGNOSIS — R1310 Dysphagia, unspecified: Secondary | ICD-10-CM

## 2018-08-11 DIAGNOSIS — R0989 Other specified symptoms and signs involving the circulatory and respiratory systems: Secondary | ICD-10-CM

## 2018-08-11 DIAGNOSIS — R9431 Abnormal electrocardiogram [ECG] [EKG]: Secondary | ICD-10-CM | POA: Diagnosis not present

## 2018-08-11 DIAGNOSIS — M7989 Other specified soft tissue disorders: Secondary | ICD-10-CM | POA: Diagnosis not present

## 2018-08-11 NOTE — Patient Instructions (Addendum)
Medication Instructions:  Your physician recommends that you continue on your current medications as directed. Please refer to the Current Medication list given to you today.  If you need a refill on your cardiac medications before your next appointment, please call your pharmacy.   Lab work: NONE  If you have labs (blood work) drawn today and your tests are completely normal, you will receive your results only by: Marland Kitchen MyChart Message (if you have MyChart) OR . A paper copy in the mail If you have any lab test that is abnormal or we need to change your treatment, we will call you to review the results.  Testing/Procedures: Your physician has requested that you have an echocardiogram. Echocardiography is a painless test that uses sound waves to create images of your heart. It provides your doctor with information about the size and shape of your heart and how well your heart's chambers and valves are working. This procedure takes approximately one hour. There are no restrictions for this procedure.  Your physician has requested that you have a carotid duplex. This test is an ultrasound of the carotid arteries in your neck. It looks at blood flow through these arteries that supply the brain with blood. Allow one hour for this exam. There are no restrictions or special instructions.   Follow-Up: At [facility], you and your health needs are our priority.  As part of our continuing mission to provide you with exceptional heart care, we have created designated Provider Care Teams.  These Care Teams include your primary Cardiologist (physician) and Advanced Practice Providers (APPs -  Physician Assistants and Nurse Practitioners) who all work together to provide you with the care you need, when you need it. You will need a follow up appointment in 3 weeks.  Please call our office 2 months in advance to schedule this appointment.  You may see No primary care provider on file. or one of the following  Advanced Practice Providers on your designated Care Team:   [place], PA-C [facility]) . Jacolyn Reedy, PA-C [facility])  Any Other Special Instructions Will Be Listed Below (If Applicable). Thank you for choosing [facility]!    Low-Sodium Eating Plan Sodium, which is an element that makes up salt, helps you maintain a healthy balance of fluids in your body. Too much sodium can increase your blood pressure and cause fluid and waste to be held in your body. Your health care provider or dietitian may recommend following this plan if you have high blood pressure (hypertension), kidney disease, liver disease, or heart failure. Eating less sodium can help lower your blood pressure, reduce swelling, and protect your heart, liver, and kidneys. What are tips for following this plan? General guidelines  Most people on this plan should limit their sodium intake to 1,500-2,000 mg (milligrams) of sodium each day. Reading food labels  The Nutrition Facts label lists the amount of sodium in one serving of the food. If you eat more than one serving, you must multiply the listed amount of sodium by the number of servings.  Choose foods with less than 140 mg of sodium per serving.  Avoid foods with 300 mg of sodium or more per serving. Shopping  Look for lower-sodium products, often labeled as "low-sodium" or "no salt added."  Always check the sodium content even if foods are labeled as "unsalted" or "no salt added".  Buy fresh foods. ? Avoid canned foods and premade or frozen meals. ? Avoid canned, cured,  or processed meats  Buy breads that have less than 80 mg of sodium per slice. Cooking  Eat more home-cooked food and less restaurant, buffet, and fast food.  Avoid adding salt when cooking. Use salt-free seasonings or herbs instead of table salt or sea salt. Check with your health care provider or pharmacist before using salt substitutes.  Cook with  plant-based oils, such as canola, sunflower, or olive oil. Meal planning  When eating at a restaurant, ask that your food be prepared with less salt or no salt, if possible.  Avoid foods that contain MSG (monosodium glutamate). MSG is sometimes added to [place] food, bouillon, and some canned foods. What foods are recommended? The items listed may not be a complete list. Talk with your dietitian about what dietary choices are best for you. Grains Low-sodium cereals, including oats, puffed wheat and rice, and shredded wheat. Low-sodium crackers. Unsalted rice. Unsalted pasta. Low-sodium bread. Whole-grain breads and whole-grain pasta. Vegetables Fresh or frozen vegetables. "No salt added" canned vegetables. "No salt added" tomato sauce and paste. Low-sodium or reduced-sodium tomato and vegetable juice. Fruits Fresh, frozen, or canned fruit. Fruit juice. Meats and other protein foods Fresh or frozen (no salt added) meat, poultry, seafood, and fish. Low-sodium canned tuna and salmon. Unsalted nuts. Dried peas, beans, and lentils without added salt. Unsalted canned beans. Eggs. Unsalted nut butters. Dairy Milk. Soy milk. Cheese that is naturally low in sodium, such as ricotta cheese, fresh mozzarella, or Swiss cheese Low-sodium or reduced-sodium cheese. Cream cheese. Yogurt. Fats and oils Unsalted butter. Unsalted margarine with no trans fat. Vegetable oils such as canola or olive oils. Seasonings and other foods Fresh and dried herbs and spices. Salt-free seasonings. Low-sodium mustard and ketchup. Sodium-free salad dressing. Sodium-free light mayonnaise. Fresh or refrigerated horseradish. Lemon juice. Vinegar. Homemade, reduced-sodium, or low-sodium soups. Unsalted popcorn and pretzels. Low-salt or salt-free chips. What foods are not recommended? The items listed may not be a complete list. Talk with your dietitian about what dietary choices are best for you. Grains Instant hot cereals. Bread  stuffing, pancake, and biscuit mixes. Croutons. Seasoned rice or pasta mixes. Noodle soup cups. Boxed or frozen macaroni and cheese. Regular salted crackers. Self-rising flour. Vegetables Sauerkraut, pickled vegetables, and relishes. Olives. [place] fries. Onion rings. Regular canned vegetables (not low-sodium or reduced-sodium). Regular canned tomato sauce and paste (not low-sodium or reduced-sodium). Regular tomato and vegetable juice (not low-sodium or reduced-sodium). Frozen vegetables in sauces. Meats and other protein foods Meat or fish that is salted, canned, smoked, spiced, or pickled. Bacon, ham, sausage, hotdogs, corned beef, chipped beef, packaged lunch meats, salt pork, jerky, pickled herring, anchovies, regular canned tuna, sardines, salted nuts. Dairy Processed cheese and cheese spreads. Cheese curds. Blue cheese. Feta cheese. String cheese. Regular cottage cheese. Buttermilk. Canned milk. Fats and oils Salted butter. Regular margarine. Ghee. Bacon fat. Seasonings and other foods Onion salt, garlic salt, seasoned salt, table salt, and sea salt. Canned and packaged gravies. Worcestershire sauce. Tartar sauce. Barbecue sauce. Teriyaki sauce. Soy sauce, including reduced-sodium. Steak sauce. Fish sauce. Oyster sauce. Cocktail sauce. Horseradish that you find on the shelf. Regular ketchup and mustard. Meat flavorings and tenderizers. Bouillon cubes. Hot sauce and Tabasco sauce. Premade or packaged marinades. Premade or packaged taco seasonings. Relishes. Regular salad dressings. Salsa. Potato and tortilla chips. Corn chips and puffs. Salted popcorn and pretzels. Canned or dried soups. Pizza. Frozen entrees and pot pies. Summary  Eating less sodium can help lower your blood pressure, reduce swelling, and  protect your heart, liver, and kidneys.  Most people on this plan should limit their sodium intake to 1,500-2,000 mg (milligrams) of sodium each day.  Canned, boxed, and frozen foods are  high in sodium. Restaurant foods, fast foods, and pizza are also very high in sodium. You also get sodium by adding salt to food.  Try to cook at home, eat more fresh fruits and vegetables, and eat less fast food, canned, processed, or prepared foods. This information is not intended to replace advice given to you by your health care provider. Make sure you discuss any questions you have with your health care provider. Document Released: 03/23/2002 Document Revised: 09/24/2016 Document Reviewed: 09/24/2016 Elsevier Interactive Patient Education  [employer].

## 2018-08-11 NOTE — Telephone Encounter (Signed)
°  Precert needed for: Echo and Carotid   Location: [facility]     Date: Aug 15, 2018

## 2018-08-14 ENCOUNTER — Encounter: Payer: Self-pay | Admitting: Gastroenterology

## 2018-08-15 ENCOUNTER — Ambulatory Visit (HOSPITAL_COMMUNITY)
Admission: RE | Admit: 2018-08-15 | Discharge: 2018-08-15 | Disposition: A | Discharge status: Home / Self Care | Payer: Medicare HMO | Source: Ambulatory Visit | Attending: Physician Assistant | Admitting: Physician Assistant

## 2018-08-15 DIAGNOSIS — I10 Essential (primary) hypertension: Secondary | ICD-10-CM | POA: Diagnosis not present

## 2018-08-15 DIAGNOSIS — E119 Type 2 diabetes mellitus without complications: Secondary | ICD-10-CM | POA: Insufficient documentation

## 2018-08-15 DIAGNOSIS — M7989 Other specified soft tissue disorders: Secondary | ICD-10-CM | POA: Diagnosis not present

## 2018-08-15 DIAGNOSIS — I6523 Occlusion and stenosis of bilateral carotid arteries: Secondary | ICD-10-CM | POA: Diagnosis not present

## 2018-08-15 DIAGNOSIS — E785 Hyperlipidemia, unspecified: Secondary | ICD-10-CM | POA: Diagnosis not present

## 2018-08-15 NOTE — Progress Notes (Signed)
*  PRELIMINARY RESULTS* Echocardiogram 2D Echocardiogram has been performed.  Jeryl Columbia 08/15/2018, 12:53 PM

## 2018-08-19 ENCOUNTER — Telehealth: Payer: Self-pay | Admitting: *Deleted

## 2018-08-19 DIAGNOSIS — I6522 Occlusion and stenosis of left carotid artery: Secondary | ICD-10-CM

## 2018-08-19 NOTE — Telephone Encounter (Signed)
-----   Message from Dyann Kief, PA-C sent at 08/18/2018  7:25 AM EST ----- Has carotid stenosis on left side 50-69% that may need further evaluation with angiogram. Refer to [facility]

## 2018-08-20 ENCOUNTER — Telehealth: Payer: Self-pay | Admitting: Physician Assistant

## 2018-08-20 NOTE — Telephone Encounter (Signed)
Requesting results of tests/ also wants a printout of results/tg

## 2018-08-20 NOTE — Telephone Encounter (Signed)
Copied echo and carotid reports for pt, will stop by office to pick up

## 2018-08-29 DIAGNOSIS — E119 Type 2 diabetes mellitus without complications: Secondary | ICD-10-CM | POA: Diagnosis not present

## 2018-09-08 DIAGNOSIS — R079 Chest pain, unspecified: Secondary | ICD-10-CM | POA: Diagnosis not present

## 2018-09-08 DIAGNOSIS — Z0000 Encounter for general adult medical examination without abnormal findings: Secondary | ICD-10-CM | POA: Diagnosis not present

## 2018-09-08 DIAGNOSIS — R1013 Epigastric pain: Secondary | ICD-10-CM | POA: Diagnosis not present

## 2018-09-08 DIAGNOSIS — N390 Urinary tract infection, site not specified: Secondary | ICD-10-CM | POA: Diagnosis not present

## 2018-09-08 DIAGNOSIS — Z6827 Body mass index (BMI) 27.0-27.9, adult: Secondary | ICD-10-CM | POA: Diagnosis not present

## 2018-09-08 DIAGNOSIS — E118 Type 2 diabetes mellitus with unspecified complications: Secondary | ICD-10-CM | POA: Diagnosis not present

## 2018-09-08 NOTE — Progress Notes (Signed)
Cardiology Office Note    Date:  09/15/2018   ID:  [name], DOB 04-Dec-1950, MRN [id number]  PCP:  Mirna Mires, MD  Cardiologist: Prentice Docker, MD EPS: None  Chief Complaint  Patient presents with  . Follow-up    History of Present Illness:  [name] is a 67 y.o. female with history of hypertension, IDDM, HLD, normal nuclear stress test in 2010.  History of abnormal EKG 2018 and underwent nuclear stress testing before undergoing spinal surgery.  This was low risk LVEF 55 to 65% with small mild intensity reversible anterior defect that may represent mild ischemia versus breast attenuation.  She proceeded to have surgery.  I saw the patient 08/11/2018 with right leg swelling.  She was eating a lot of salt and put it on everything.  Was also complaining of a lot of dysphasia.  I ordered 2D echo which showed normal LVEF 55 to 60% with indeterminate diastolic function.  Carotid Dopplers also showed a 50 to 69% LICA and she was referred to [facility] and she saw Dr. Arbie Cookey today who will follow yearly.  She was also referred to GI for her dysphasia.  Patient comes in today for follow-up.  She only took her losartan 10 minutes ago.  Blood pressure was high.  She says she does not always take her medicines because she gets tired of taking them.  Has cut back on her salt some.  No longer cooking with fat back.  She does love salt.  Continues to have dysphasia and chest pain associated with eating.    Past Medical History:  Diagnosis Date  . Asthma   . Bronchitis   . Diabetes mellitus   . Hyperlipidemia   . Hypertension   . Low back pain   . Migraine   . Osteoarthritis   . Sciatica   . Varicose veins   . Vertigo     Past Surgical History:  Procedure Laterality Date  . ABDOMINAL HYSTERECTOMY    . CARPAL TUNNEL RELEASE    . CESAREAN SECTION    . INGUINAL HERNIA REPAIR    . LUMBAR FUSION    . POSTERIOR LUMBAR FUSION 4 LEVEL N/A 03/04/2018   Procedure: Right  transforaminal lumbar interbody fusion L1-2, L2-3 Posterior fusion T10, T11, T12, L1, L2 with T 11, T12 pedicle screws, superior sublaminar hooks T10, local bone graft, allograft cancellous chips Vivigen;  Surgeon: Kerrin Champagne, MD;  Location: [facility] OR;  Service: Orthopedics;  Laterality: N/A;    Current Medications: Current Meds  Medication Sig  . Alogliptin-metFORMIN HCl 12.02-999 MG TABS Take 1 tablet by mouth 2 (two) times daily.   Marland Kitchen aspirin EC 81 MG tablet Take 81 mg by mouth daily.  Marland Kitchen gabapentin (NEURONTIN) 300 MG capsule Take 1 capsule (300 mg total) by mouth 3 (three) times daily.  . insulin NPH-insulin regular (NOVOLIN 70/30) (70-30) 100 UNIT/ML injection Inject 66 Units into the skin 2 (two) times daily.   Marland Kitchen losartan (COZAAR) 50 MG tablet Take 50 mg by mouth daily.  . nitroGLYCERIN (NITROSTAT) 0.4 MG SL tablet Place 0.4 mg under the tongue every 5 (five) minutes as needed for chest pain. Dissolve one tablet under the tongue every 5 mintues as needed for chest pain.  Letta Pate VERIO test strip See admin instructions.  Tana Conch INSULIN SYRINGE 1ML/31G 31G X 5/16" 1 ML MISC USE 1 TWICE DAILY AS DIRECTED  . simvastatin (ZOCOR) 20 MG tablet Take 20 mg by mouth at  bedtime.    Marland Kitchen zolpidem (AMBIEN CR) 6.25 MG CR tablet Take 1 tablet (6.25 mg total) by mouth at bedtime as needed for sleep.     Allergies:   Patient has no known allergies.   Social History   Socioeconomic History  . Marital status: Single    Spouse name: Not on file  . Number of children: Not on file  . Years of education: 12th   . Highest education level: Not on file  Occupational History  . Occupation: Disabled    Associate Professor: UNEMPLOYED  Social Needs  . Financial resource strain: Not on file  . Food insecurity:    Worry: Not on file    Inability: Not on file  . Transportation needs:    Medical: Not on file    Non-medical: Not on file  Tobacco Use  . Smoking status: Never Smoker  . Smokeless tobacco: Never Used    Substance and Sexual Activity  . Alcohol use: No    Alcohol/week: 0.0 standard drinks  . Drug use: No  . Sexual activity: Not on file  Lifestyle  . Physical activity:    Days per week: Not on file    Minutes per session: Not on file  . Stress: Not on file  Relationships  . Social connections:    Talks on phone: Not on file    Gets together: Not on file    Attends religious service: Not on file    Active member of club or organization: Not on file    Attends meetings of clubs or organizations: Not on file    Relationship status: Not on file  Other Topics Concern  . Not on file  Social History Narrative   No caffeine use      Family History:  The patient's family history includes Heart failure in her sister and sister; Lung cancer in her mother; Stroke in her father.   ROS:   Please see the history of present illness.    Review of Systems  Gastrointestinal: Positive for abdominal pain and dysphagia.   All other systems reviewed and are negative.   PHYSICAL EXAM:   VS:  BP (!) 158/68   Pulse 73   Ht 5\' 5"  (1.651 m)   Wt 178 lb (80.7 kg)   SpO2 93%   BMI 29.62 kg/m   Physical Exam  GEN: Well nourished, well developed, in no acute distress  Neck: no JVD, carotid bruits, or masses Cardiac:RRR; no murmurs, rubs, or gallops  Respiratory:  clear to auscultation bilaterally, normal work of breathing GI: soft, nontender, nondistended, + BS Ext: without cyanosis, clubbing, or edema, Good distal pulses bilaterally Neuro:  Alert and Oriented x 3 Psych: euthymic mood, full affect  Wt Readings from Last 3 Encounters:  09/15/18 178 lb (80.7 kg)  09/15/18 180 lb 6 oz (81.8 kg)  08/11/18 182 lb 6.4 oz (82.7 kg)      Studies/Labs Reviewed:   EKG:  EKG is not ordered today.  Recent Labs: 02/28/2018: ALT 16 03/06/2018: BUN 11; Creatinine, Ser 0.79; Hemoglobin 9.2; Platelets 172; Potassium 4.3; Sodium 137   Lipid Panel    Component Value Date/Time   CHOL 157 11/03/2009    TRIG 171 11/03/2009   HDL 29 11/03/2009   LDLCALC 94 11/03/2009    Additional studies/ records that were reviewed today include:  2D echo 11/1/2019Study Conclusions   - Left ventricle: The cavity size was normal. There was mild focal   basal hypertrophy of the  septum. Systolic function was normal.   The estimated ejection fraction was in the range of 55% to 60%.   Wall motion was normal; there were no regional wall motion   abnormalities. Indeterminate diastolic function. - Aortic valve: Mildly calcified annulus. Trileaflet. - Mitral valve: There was trivial regurgitation. - Right atrium: Central venous pressure (est): 3 mm Hg. - Atrial septum: No defect or patent foramen ovale was identified. - Tricuspid valve: There was trivial regurgitation. - Pulmonary arteries: Systolic pressure could not be accurately   estimated. - Pericardium, extracardiac: There was no pericardial effusion.    NST 11/2/2018There was no ST segment deviation noted during stress.  This is a low risk study.  The left ventricular ejection fraction is normal (55-65%).  Small mild intensity reversible anterior defect. May represent mild ischemia vs differences in breast attenuation. Either findings supports low risk.    Carotid Dopplers 08/15/18 IMPRESSION: 1. Moderate amount of left-sided atherosclerotic plaque results in elevated peak systolic velocities within the left internal carotid artery compatible with the 50-69% luminal narrowing range. Further evaluation with CTA could performed as clinically indicated. 2. Unremarkable sonographic evaluation of the right carotid system.     Electronically Signed   By: Simonne Come M.D.   On: 08/15/2018 14:03   ASSESSMENT:    1. Leg swelling   2. Bilateral carotid bruits   3. Essential hypertension   4. Abnormal EKG   5. Hyperlipidemia, unspecified hyperlipidemia type   6. Dysphagia, unspecified type      PLAN:  In order of problems listed  above:  Leg swelling felt secondary to dietary indiscretion.  2D echo showed normal LV function and diastolic dysfunion was indeterminate.  2 g sodium diet recommended.  She is working on low sodium but struggles with this.  No ankle swelling today.  Follow-up with Dr. Purvis Sheffield in 2 months.  Bilateral carotid bruits with LICA 50 to 69% seen by Dr. Arbie Cookey today and he will continue to monitor yearly.  Essential hypertension blood pressure up today but patient only took her medication 10 minutes before coming in.  She says she does not take it regularly.  Had a long discussion with her on the importance of compliance.  History of abnormal EKG with low risk nuclear stress test 08/2017  Hyperlipidemia on Zocor  Dysphasia referred to GI.  To be seen in January.  Medication Adjustments/Labs and Tests Ordered: Current medicines are reviewed at length with the patient today.  Concerns regarding medicines are outlined above.  Medication changes, Labs and Tests ordered today are listed in the Patient Instructions below. Patient Instructions   Your physician recommends that you schedule a follow-up appointment in: 2 MONTHS WITH DR Purvis Sheffield  Your physician recommends that you continue on your current medications as directed. Please refer to the Current Medication list given to you today.  Two Gram Sodium Diet 2000 mg  What is Sodium? Sodium is a mineral found naturally in many foods. The most significant source of sodium in the diet is table salt, which is about 40% sodium.  Processed, convenience, and preserved foods also contain a large amount of sodium.  The body needs only 500 mg of sodium daily to function,  A normal diet provides more than enough sodium even if you do not use salt.  Why Limit Sodium? A build up of sodium in the body can cause thirst, increased blood pressure, shortness of breath, and water retention.  Decreasing sodium in the diet can reduce edema and  risk of heart attack or  stroke associated with high blood pressure.  Keep in mind that there are many other factors involved in these health problems.  Heredity, obesity, lack of exercise, cigarette smoking, stress and what you eat all play a role.  General Guidelines:  Do not add salt at the table or in cooking.  One teaspoon of salt contains over 2 grams of sodium.  Read food labels  Avoid processed and convenience foods  Ask your dietitian before eating any foods not dicussed in the menu planning guidelines  Consult your physician if you wish to use a salt substitute or a sodium containing medication such as antacids.  Limit milk and milk products to 16 oz (2 cups) per day.  Shopping Hints:  READ LABELS!! "Dietetic" does not necessarily mean low sodium.  Salt and other sodium ingredients are often added to foods during processing.   Menu Planning Guidelines Food Group Choose More Often Avoid  Beverages (see also the milk group All fruit juices, low-sodium, salt-free vegetables juices, low-sodium carbonated beverages Regular vegetable or tomato juices, commercially softened water used for drinking or cooking  Breads and Cereals Enriched white, wheat, rye and pumpernickel bread, hard rolls and dinner rolls; muffins, cornbread and waffles; most dry cereals, cooked cereal without added salt; unsalted crackers and breadsticks; low sodium or homemade bread crumbs Bread, rolls and crackers with salted tops; quick breads; instant hot cereals; pancakes; commercial bread stuffing; self-rising flower and biscuit mixes; regular bread crumbs or cracker crumbs  Desserts and Sweets Desserts and sweets mad with mild should be within allowance Instant pudding mixes and cake mixes  Fats Butter or margarine; vegetable oils; unsalted salad dressings, regular salad dressings limited to 1 Tbs; light, sour and heavy cream Regular salad dressings containing bacon fat, bacon bits, and salt pork; snack dips made with instant soup mixes  or processed cheese; salted nuts  Fruits Most fresh, frozen and canned fruits Fruits processed with salt or sodium-containing ingredient (some dried fruits are processed with sodium sulfites        Vegetables Fresh, frozen vegetables and low- sodium canned vegetables Regular canned vegetables, sauerkraut, pickled vegetables, and others prepared in brine; frozen vegetables in sauces; vegetables seasoned with ham, bacon or salt pork  Condiments, Sauces, Miscellaneous  Salt substitute with physician's approval; pepper, herbs, spices; vinegar, lemon or lime juice; hot pepper sauce; garlic powder, onion powder, low sodium soy sauce (1 Tbs.); low sodium condiments (ketchup, chili sauce, mustard) in limited amounts (1 tsp.) fresh ground horseradish; unsalted tortilla chips, pretzels, potato chips, popcorn, salsa (1/4 cup) Any seasoning made with salt including garlic salt, celery salt, onion salt, and seasoned salt; sea salt, rock salt, kosher salt; meat tenderizers; monosodium glutamate; mustard, regular soy sauce, barbecue, sauce, chili sauce, teriyaki sauce, steak sauce, Worcestershire sauce, and most flavored vinegars; canned gravy and mixes; regular condiments; salted snack foods, olives, picles, relish, horseradish sauce, catsup   Food preparation: Try these seasonings Meats:    Pork Sage, onion Serve with applesauce  Chicken Poultry seasoning, thyme, parsley Serve with cranberry sauce  Lamb Curry powder, rosemary, garlic, thyme Serve with mint sauce or jelly  Veal Marjoram, basil Serve with current jelly, cranberry sauce  Beef Pepper, bay leaf Serve with dry mustard, unsalted chive butter  Fish [place] leaf, dill Serve with unsalted lemon butter, unsalted parsley butter  Vegetables:    Asparagus Lemon juice   Broccoli Lemon juice   Carrots Mustard dressing parsley, mint, nutmeg, glazed with unsalted  butter and sugar   Green beans Marjoram, lemon juice, nutmeg,dill seed   Tomatoes Basil,  marjoram, onion   Spice /blend for [employer]" 4 tsp ground thyme 1 tsp ground sage 3 tsp ground rosemary 4 tsp ground marjoram   Test your knowledge 1. A product that says "Salt Free" may still contain sodium. True or False 2. Garlic Powder and Hot Pepper Sauce an be used as alternative seasonings.True or False 3. Processed foods have more sodium than fresh foods.  True or False 4. Canned Vegetables have less sodium than froze True or False  WAYS TO DECREASE YOUR SODIUM INTAKE 1. Avoid the use of added salt in cooking and at the table.  Table salt (and other prepared seasonings which contain salt) is probably one of the greatest sources of sodium in the diet.  Unsalted foods can gain flavor from the sweet, sour, and butter taste sensations of herbs and spices.  Instead of using salt for seasoning, try the following seasonings with the foods listed.  Remember: how you use them to enhance natural food flavors is limited only by your creativity... Allspice-Meat, fish, eggs, fruit, peas, red and yellow vegetables Almond Extract-Fruit baked goods Anise Seed-Sweet breads, fruit, carrots, beets, cottage cheese, cookies (tastes like licorice) Basil-Meat, fish, eggs, vegetables, rice, vegetables salads, soups, sauces Bay Leaf-Meat, fish, stews, poultry Burnet-Salad, vegetables (cucumber-like flavor) Caraway Seed-Bread, cookies, cottage cheese, meat, vegetables, cheese, rice Cardamon-Baked goods, fruit, soups Celery Powder or seed-Salads, salad dressings, sauces, meatloaf, soup, bread.Do not use  celery salt Chervil-Meats, salads, fish, eggs, vegetables, cottage cheese (parsley-like flavor) Chili Power-Meatloaf, chicken cheese, corn, eggplant, egg dishes Chives-Salads cottage cheese, egg dishes, soups, vegetables, sauces Cilantro-Salsa, casseroles Cinnamon-Baked goods, fruit, pork, lamb, chicken, carrots Cloves-Fruit, baked goods, fish, pot roast, green beans, beets, carrots Coriander-Pastry,  cookies, meat, salads, cheese (lemon-orange flavor) Cumin-Meatloaf, fish,cheese, eggs, cabbage,fruit pie (caraway flavor) [employer], fruit, eggs, fish, poultry, cottage cheese, vegetables Dill Seed-Meat, cottage cheese, poultry, vegetables, fish, salads, bread Fennel Seed-Bread, cookies, apples, pork, eggs, fish, beets, cabbage, cheese, Licorice-like flavor Garlic-(buds or powder) Salads, meat, poultry, fish, bread, butter, vegetables, potatoes.Do not  use garlic salt Ginger-Fruit, vegetables, baked goods, meat, fish, poultry Horseradish Root-Meet, vegetables, butter Lemon Juice or Extract-Vegetables, fruit, tea, baked goods, fish salads Mace-Baked goods fruit, vegetables, fish, poultry (taste like nutmeg) Maple Extract-Syrups Marjoram-Meat, chicken, fish, vegetables, breads, green salads (taste like Sage) Mint-Tea, lamb, sherbet, vegetables, desserts, carrots, cabbage Mustard, Dry or Seed-Cheese, eggs, meats, vegetables, poultry Nutmeg-Baked goods, fruit, chicken, eggs, vegetables, desserts Onion Powder-Meat, fish, poultry, vegetables, cheese, eggs, bread, rice salads (Do not use   Onion salt) Orange Extract-Desserts, baked goods Oregano-Pasta, eggs, cheese, onions, pork, lamb, fish, chicken, vegetables, green salads Paprika-Meat, fish, poultry, eggs, cheese, vegetables Parsley Flakes-Butter, vegetables, meat fish, poultry, eggs, bread, salads (certain forms may   Contain sodium Pepper-Meat fish, poultry, vegetables, eggs Peppermint Extract-Desserts, baked goods Poppy Seed-Eggs, bread, cheese, fruit dressings, baked goods, noodles, vegetables, cottage  [employer], poultry, meat, fish, cauliflower, turnips,eggs bread Saffron-Rice, bread, veal, chicken, fish, eggs Sage-Meat, fish, poultry, onions, eggplant, tomateos, pork, stews Savory-Eggs, salads, poultry, meat, rice, vegetables, soups, pork Tarragon-Meat, poultry, fish, eggs, butter,  vegetables (licorice-like flavor)  Thyme-Meat, poultry, fish, eggs, vegetables, (clover-like flavor), sauces, soups Tumeric-Salads, butter, eggs, fish, rice, vegetables (saffron-like flavor) Vanilla Extract-Baked goods, candy Vinegar-Salads, vegetables, meat marinades Walnut Extract-baked goods, candy  2. Choose your Foods Wisely   The following is a list of foods to avoid which are high in sodium:  Meats-Avoid all smoked, canned, salt cured, dried and kosher meat and fish as well as Anchovies   [employer] meats:Bologna, Liverwurst, Pastrami Canned meat or fish  Marinated herring Caviar    Pepperoni Corned Beef   Pizza Dried chipped beef  Salami Frozen breaded fish or meat Salt pork Frankfurters or hot dogs  Sardines Gefilte fish   Sausage Ham (boiled ham, Proscuitto Smoked butt    spiced ham)   Spam      TV Dinners Vegetables Canned vegetables (Regular) Relish Canned mushrooms  Sauerkraut Olives    Tomato juice Pickles  Bakery and Dessert Products Canned puddings  Cream pies Cheesecake   Decorated cakes Cookies  Beverages/Juices Tomato juice, regular  Gatorade   V-8 vegetable juice, regular  Breads and Cereals Biscuit mixes   Salted potato chips, corn chips, pretzels Bread stuffing mixes  Salted crackers and rolls Pancake and waffle mixes Self-rising flour  Seasonings Accent    Meat sauces Barbecue sauce  Meat tenderizer Catsup    Monosodium glutamate (MSG) Celery salt   Onion salt Chili sauce   Prepared mustard Garlic salt   Salt, seasoned salt, sea salt Gravy mixes   Soy sauce Horseradish   Steak sauce Ketchup   Tartar sauce Lite salt    Teriyaki sauce Marinade mixes   Worcestershire sauce  Others Baking powder   Cocoa and cocoa mixes Baking soda   Commercial casserole mixes Candy-caramels, chocolate  Dehydrated soups    Bars, fudge,nougats  Instant rice and pasta mixes Canned broth or soup  Maraschino cherries Cheese, aged and processed  cheese and cheese spreads  Learning Assessment Quiz  Indicated T (for True) or F (for False) for each of the following statements:  1. _____ Fresh fruits and vegetables and unprocessed grains are generally low in sodium 2. _____ Water may contain a considerable amount of sodium, depending on the source 3. _____ You can always tell if a food is high in sodium by tasting it 4. _____ Certain laxatives my be high in sodium and should be avoided unless prescribed   by a physician or pharmacist 5. _____ Salt substitutes may be used freely by anyone on a sodium restricted diet 6. _____ Sodium is present in table salt, food additives and as a natural component of   most foods 7. _____ Table salt is approximately 90% sodium 8. _____ Limiting sodium intake may help prevent excess fluid accumulation in the body 9. _____ On a sodium-restricted diet, seasonings such as bouillon soy sauce, and    cooking wine should be used in place of table salt 10. _____ On an ingredient list, a product which lists monosodium glutamate as the first   ingredient is an appropriate food to include on a low sodium diet  Circle the best answer(s) to the following statements (Hint: there may be more than one correct answer)  11. On a low-sodium diet, some acceptable snack items are:    A. Olives  F. Bean dip   K. Grapefruit juice    B. Salted Pretzels G. Commercial Popcorn   L. Canned peaches    C. Carrot Sticks  H. Bouillon   M. Unsalted nuts   D. [place] fries  I. Peanut butter crackers N. Salami   E. Sweet pickles J. Tomato Juice   O. Pizza  12.  Seasonings that may be used freely on a reduced - sodium diet include   A. Lemon wedges F.Monosodium glutamate K. Celery seed  B.Soysauce   G. Pepper   L. Mustard powder   C. Sea salt  H. Cooking wine  M. Onion flakes   D. Vinegar  E. Prepared horseradish N. Salsa   E. Sage   J. Worcestershire sauce  O. Chutney      Signed, Jacolyn Reedy, PA-C  09/15/2018  1:49 PM    [facility] [address] [place], [place], [place]  [address] Phone: [phone]; Fax: [phone]

## 2018-09-15 ENCOUNTER — Encounter: Payer: Self-pay | Admitting: Physician Assistant

## 2018-09-15 ENCOUNTER — Encounter: Payer: Self-pay | Admitting: Vascular Surgery

## 2018-09-15 ENCOUNTER — Ambulatory Visit: Payer: Medicare HMO | Admitting: Vascular Surgery

## 2018-09-15 ENCOUNTER — Ambulatory Visit: Payer: Medicare HMO | Admitting: Physician Assistant

## 2018-09-15 VITALS — BP 158/68 | HR 73 | Ht 65.0 in | Wt 178.0 lb

## 2018-09-15 VITALS — BP 159/80 | Temp 98.7°F | Ht 65.0 in | Wt 180.4 lb

## 2018-09-15 DIAGNOSIS — M7989 Other specified soft tissue disorders: Secondary | ICD-10-CM

## 2018-09-15 DIAGNOSIS — R0989 Other specified symptoms and signs involving the circulatory and respiratory systems: Secondary | ICD-10-CM | POA: Diagnosis not present

## 2018-09-15 DIAGNOSIS — R9431 Abnormal electrocardiogram [ECG] [EKG]: Secondary | ICD-10-CM | POA: Diagnosis not present

## 2018-09-15 DIAGNOSIS — R1310 Dysphagia, unspecified: Secondary | ICD-10-CM | POA: Diagnosis not present

## 2018-09-15 DIAGNOSIS — E785 Hyperlipidemia, unspecified: Secondary | ICD-10-CM

## 2018-09-15 DIAGNOSIS — I6522 Occlusion and stenosis of left carotid artery: Secondary | ICD-10-CM | POA: Diagnosis not present

## 2018-09-15 DIAGNOSIS — I10 Essential (primary) hypertension: Secondary | ICD-10-CM

## 2018-09-15 NOTE — Patient Instructions (Signed)
Your physician recommends that you schedule a follow-up appointment in: 2 MONTHS WITH DR Purvis Sheffield  Your physician recommends that you continue on your current medications as directed. Please refer to the Current Medication list given to you today.  Two Gram Sodium Diet 2000 mg  What is Sodium? Sodium is a mineral found naturally in many foods. The most significant source of sodium in the diet is table salt, which is about 40% sodium.  Processed, convenience, and preserved foods also contain a large amount of sodium.  The body needs only 500 mg of sodium daily to function,  A normal diet provides more than enough sodium even if you do not use salt.  Why Limit Sodium? A build up of sodium in the body can cause thirst, increased blood pressure, shortness of breath, and water retention.  Decreasing sodium in the diet can reduce edema and risk of heart attack or stroke associated with high blood pressure.  Keep in mind that there are many other factors involved in these health problems.  Heredity, obesity, lack of exercise, cigarette smoking, stress and what you eat all play a role.  General Guidelines:  Do not add salt at the table or in cooking.  One teaspoon of salt contains over 2 grams of sodium.  Read food labels  Avoid processed and convenience foods  Ask your dietitian before eating any foods not dicussed in the menu planning guidelines  Consult your physician if you wish to use a salt substitute or a sodium containing medication such as antacids.  Limit milk and milk products to 16 oz (2 cups) per day.  Shopping Hints:  READ LABELS!! "Dietetic" does not necessarily mean low sodium.  Salt and other sodium ingredients are often added to foods during processing.   Menu Planning Guidelines Food Group Choose More Often Avoid  Beverages (see also the milk group All fruit juices, low-sodium, salt-free vegetables juices, low-sodium carbonated beverages Regular vegetable or tomato juices,  commercially softened water used for drinking or cooking  Breads and Cereals Enriched white, wheat, rye and pumpernickel bread, hard rolls and dinner rolls; muffins, cornbread and waffles; most dry cereals, cooked cereal without added salt; unsalted crackers and breadsticks; low sodium or homemade bread crumbs Bread, rolls and crackers with salted tops; quick breads; instant hot cereals; pancakes; commercial bread stuffing; self-rising flower and biscuit mixes; regular bread crumbs or cracker crumbs  Desserts and Sweets Desserts and sweets mad with mild should be within allowance Instant pudding mixes and cake mixes  Fats Butter or margarine; vegetable oils; unsalted salad dressings, regular salad dressings limited to 1 Tbs; light, sour and heavy cream Regular salad dressings containing bacon fat, bacon bits, and salt pork; snack dips made with instant soup mixes or processed cheese; salted nuts  Fruits Most fresh, frozen and canned fruits Fruits processed with salt or sodium-containing ingredient (some dried fruits are processed with sodium sulfites        Vegetables Fresh, frozen vegetables and low- sodium canned vegetables Regular canned vegetables, sauerkraut, pickled vegetables, and others prepared in brine; frozen vegetables in sauces; vegetables seasoned with ham, bacon or salt pork  Condiments, Sauces, Miscellaneous  Salt substitute with physician's approval; pepper, herbs, spices; vinegar, lemon or lime juice; hot pepper sauce; garlic powder, onion powder, low sodium soy sauce (1 Tbs.); low sodium condiments (ketchup, chili sauce, mustard) in limited amounts (1 tsp.) fresh ground horseradish; unsalted tortilla chips, pretzels, potato chips, popcorn, salsa (1/4 cup) Any seasoning made with salt including garlic  salt, celery salt, onion salt, and seasoned salt; sea salt, rock salt, kosher salt; meat tenderizers; monosodium glutamate; mustard, regular soy sauce, barbecue, sauce, chili sauce,  teriyaki sauce, steak sauce, Worcestershire sauce, and most flavored vinegars; canned gravy and mixes; regular condiments; salted snack foods, olives, picles, relish, horseradish sauce, catsup   Food preparation: Try these seasonings Meats:    Pork Sage, onion Serve with applesauce  Chicken Poultry seasoning, thyme, parsley Serve with cranberry sauce  Lamb Curry powder, rosemary, garlic, thyme Serve with mint sauce or jelly  Veal Marjoram, basil Serve with current jelly, cranberry sauce  Beef Pepper, bay leaf Serve with dry mustard, unsalted chive butter  Fish Bay leaf, dill Serve with unsalted lemon butter, unsalted parsley butter  Vegetables:    Asparagus Lemon juice   Broccoli Lemon juice   Carrots Mustard dressing parsley, mint, nutmeg, glazed with unsalted butter and sugar   Green beans Marjoram, lemon juice, nutmeg,dill seed   Tomatoes Basil, marjoram, onion   Spice /blend for [employer]"Salt Shaker" 4 tsp ground thyme 1 tsp ground sage 3 tsp ground rosemary 4 tsp ground marjoram   Test your knowledge 1. A product that says "Salt Free" may still contain sodium. True or False 2. Garlic Powder and Hot Pepper Sauce an be used as alternative seasonings.True or False 3. Processed foods have more sodium than fresh foods.  True or False 4. Canned Vegetables have less sodium than froze True or False  WAYS TO DECREASE YOUR SODIUM INTAKE 1. Avoid the use of added salt in cooking and at the table.  Table salt (and other prepared seasonings which contain salt) is probably one of the greatest sources of sodium in the diet.  Unsalted foods can gain flavor from the sweet, sour, and butter taste sensations of herbs and spices.  Instead of using salt for seasoning, try the following seasonings with the foods listed.  Remember: how you use them to enhance natural food flavors is limited only by your creativity... Allspice-Meat, fish, eggs, fruit, peas, red and yellow vegetables Almond Extract-Fruit baked  goods Anise Seed-Sweet breads, fruit, carrots, beets, cottage cheese, cookies (tastes like licorice) Basil-Meat, fish, eggs, vegetables, rice, vegetables salads, soups, sauces Bay Leaf-Meat, fish, stews, poultry Burnet-Salad, vegetables (cucumber-like flavor) Caraway Seed-Bread, cookies, cottage cheese, meat, vegetables, cheese, rice Cardamon-Baked goods, fruit, soups Celery Powder or seed-Salads, salad dressings, sauces, meatloaf, soup, bread.Do not use  celery salt Chervil-Meats, salads, fish, eggs, vegetables, cottage cheese (parsley-like flavor) Chili Power-Meatloaf, chicken cheese, corn, eggplant, egg dishes Chives-Salads cottage cheese, egg dishes, soups, vegetables, sauces Cilantro-Salsa, casseroles Cinnamon-Baked goods, fruit, pork, lamb, chicken, carrots Cloves-Fruit, baked goods, fish, pot roast, green beans, beets, carrots Coriander-Pastry, cookies, meat, salads, cheese (lemon-orange flavor) Cumin-Meatloaf, fish,cheese, eggs, cabbage,fruit pie (caraway flavor) [employer]ersCurry Powder-Meat, fruit, eggs, fish, poultry, cottage cheese, vegetables Dill Seed-Meat, cottage cheese, poultry, vegetables, fish, salads, bread Fennel Seed-Bread, cookies, apples, pork, eggs, fish, beets, cabbage, cheese, Licorice-like flavor Garlic-(buds or powder) Salads, meat, poultry, fish, bread, butter, vegetables, potatoes.Do not  use garlic salt Ginger-Fruit, vegetables, baked goods, meat, fish, poultry Horseradish Root-Meet, vegetables, butter Lemon Juice or Extract-Vegetables, fruit, tea, baked goods, fish salads Mace-Baked goods fruit, vegetables, fish, poultry (taste like nutmeg) Maple Extract-Syrups Marjoram-Meat, chicken, fish, vegetables, breads, green salads (taste like Sage) Mint-Tea, lamb, sherbet, vegetables, desserts, carrots, cabbage Mustard, Dry or Seed-Cheese, eggs, meats, vegetables, poultry Nutmeg-Baked goods, fruit, chicken, eggs, vegetables, desserts Onion Powder-Meat, fish, poultry,  vegetables, cheese, eggs, bread, rice salads (Do not use   Onion salt)  Orange Extract-Desserts, baked Psychiatric nurse, eggs, cheese, onions, pork, lamb, fish, chicken, vegetables, green salads Paprika-Meat, fish, poultry, eggs, cheese, vegetables Parsley Flakes-Butter, vegetables, meat fish, poultry, eggs, bread, salads (certain forms may   Contain sodium Pepper-Meat fish, poultry, vegetables, eggs Peppermint Extract-Desserts, baked goods Poppy Seed-Eggs, bread, cheese, fruit dressings, baked goods, noodles, vegetables, cottage  [employer], poultry, meat, fish, cauliflower, turnips,eggs bread Saffron-Rice, bread, veal, chicken, fish, eggs Sage-Meat, fish, poultry, onions, eggplant, tomateos, pork, stews Savory-Eggs, salads, poultry, meat, rice, vegetables, soups, pork Tarragon-Meat, poultry, fish, eggs, butter, vegetables (licorice-like flavor)  Thyme-Meat, poultry, fish, eggs, vegetables, (clover-like flavor), sauces, soups Tumeric-Salads, butter, eggs, fish, rice, vegetables (saffron-like flavor) Vanilla Extract-Baked goods, candy Vinegar-Salads, vegetables, meat marinades Walnut Extract-baked goods, candy  2. Choose your Foods Wisely   The following is a list of foods to avoid which are high in sodium:  Meats-Avoid all smoked, canned, salt cured, dried and kosher meat and fish as well as Anchovies   [employer] meats:Bologna, Liverwurst, Pastrami Canned meat or fish  Marinated herring Caviar    Pepperoni Corned Beef   Pizza Dried chipped beef  Salami Frozen breaded fish or meat Salt pork Frankfurters or hot dogs  Sardines Gefilte fish   Sausage Ham (boiled ham, Proscuitto Smoked butt    spiced ham)   Spam      TV Dinners Vegetables Canned vegetables (Regular) Relish Canned mushrooms  Sauerkraut Olives    Tomato juice Pickles  Bakery and Dessert Products Canned puddings  Cream pies Cheesecake   Decorated  cakes Cookies  Beverages/Juices Tomato juice, regular  Gatorade   V-8 vegetable juice, regular  Breads and Cereals Biscuit mixes   Salted potato chips, corn chips, pretzels Bread stuffing mixes  Salted crackers and rolls Pancake and waffle mixes Self-rising flour  Seasonings Accent    Meat sauces Barbecue sauce  Meat tenderizer Catsup    Monosodium glutamate (MSG) Celery salt   Onion salt Chili sauce   Prepared mustard Garlic salt   Salt, seasoned salt, sea salt Gravy mixes   Soy sauce Horseradish   Steak sauce Ketchup   Tartar sauce Lite salt    Teriyaki sauce Marinade mixes   Worcestershire sauce  Others Baking powder   Cocoa and cocoa mixes Baking soda   Commercial casserole mixes Candy-caramels, chocolate  Dehydrated soups    Bars, fudge,nougats  Instant rice and pasta mixes Canned broth or soup  Maraschino cherries Cheese, aged and processed cheese and cheese spreads  Learning Assessment Quiz  Indicated T (for True) or F (for False) for each of the following statements:  1. _____ Fresh fruits and vegetables and unprocessed grains are generally low in sodium 2. _____ Water may contain a considerable amount of sodium, depending on the source 3. _____ You can always tell if a food is high in sodium by tasting it 4. _____ Certain laxatives my be high in sodium and should be avoided unless prescribed   by a physician or pharmacist 5. _____ Salt substitutes may be used freely by anyone on a sodium restricted diet 6. _____ Sodium is present in table salt, food additives and as a natural component of   most foods 7. _____ Table salt is approximately 90% sodium 8. _____ Limiting sodium intake may help prevent excess fluid accumulation in the body 9. _____ On a sodium-restricted diet, seasonings such as bouillon soy sauce, and    cooking wine should be used in place of table salt  10. _____ On an ingredient list, a product which lists monosodium glutamate as the first    ingredient is an appropriate food to include on a low sodium diet  Circle the best answer(s) to the following statements (Hint: there may be more than one correct answer)  11. On a low-sodium diet, some acceptable snack items are:    A. Olives  F. Bean dip   K. Grapefruit juice    B. Salted Pretzels G. Commercial Popcorn   L. Canned peaches    C. Carrot Sticks  H. Bouillon   M. Unsalted nuts   D. [place] fries  I. Peanut butter crackers N. Salami   E. Sweet pickles J. Tomato Juice   O. Pizza  12.  Seasonings that may be used freely on a reduced - sodium diet include   A. Lemon wedges F.Monosodium glutamate K. Celery seed    B.Soysauce   G. Pepper   L. Mustard powder   C. Sea salt  H. Cooking wine  M. Onion flakes   D. Vinegar  E. Prepared horseradish N. Salsa   E. Sage   J. Worcestershire sauce  O. Chutney

## 2018-09-15 NOTE — Progress Notes (Signed)
[facility] [place]  Patient name: [name] Rattan MRN: [id number] DOB: Dec 16, 1950 Sex: female  REASON FOR CONSULT: Evaluation of asymptomatic left carotid stenosis  In today in our [place] office  HPI: [name] Maddison is a 67 y.o. female, who is here today for discussion of recent ultrasound showing moderate left renal artery stenosis.  She was having chest pain which was becoming more significant for her.  She is currently undergoing evaluation of this and reports that she still is having chest pain.  She was found to have asymptomatic bruit and underwent duplex showing left carotid stenosis and is here today for further discussion.  She specifically denies any prior history of a aphasia, amaurosis fugax, transient ischemic attack or stroke.  She is right-handed.  Does not smoke cigarettes.  She does have a long history of insulin-dependent diabetes.  Past Medical History:  Diagnosis Date  . Asthma   . Bronchitis   . Diabetes mellitus   . Hyperlipidemia   . Hypertension   . Low back pain   . Migraine   . Osteoarthritis   . Sciatica   . Varicose veins   . Vertigo     Family History  Problem Relation Age of Onset  . Lung cancer Mother   . Stroke Father   . Heart failure Sister   . Heart failure Sister     SOCIAL HISTORY: Social History   Socioeconomic History  . Marital status: Single    Spouse name: Not on file  . Number of children: Not on file  . Years of education: 12th   . Highest education level: Not on file  Occupational History  . Occupation: Disabled    Associate Professormployer: UNEMPLOYED  Social Needs  . Financial resource strain: Not on file  . Food insecurity:    Worry: Not on file    Inability: Not on file  . Transportation needs:    Medical: Not on file    Non-medical: Not on file  Tobacco Use  . Smoking status: Never Smoker  . Smokeless tobacco: Never Used  Substance and Sexual Activity   . Alcohol use: No    Alcohol/week: 0.0 standard drinks  . Drug use: No  . Sexual activity: Not on file  Lifestyle  . Physical activity:    Days per week: Not on file    Minutes per session: Not on file  . Stress: Not on file  Relationships  . Social connections:    Talks on phone: Not on file    Gets together: Not on file    Attends religious service: Not on file    Active member of club or organization: Not on file    Attends meetings of clubs or organizations: Not on file    Relationship status: Not on file  . Intimate partner violence:    Fear of current or ex partner: Not on file    Emotionally abused: Not on file    Physically abused: Not on file    Forced sexual activity: Not on file  Other Topics Concern  . Not on file  Social History Narrative   No caffeine use     No Known Allergies  Current Outpatient Medications  Medication Sig Dispense Refill  . Alogliptin-metFORMIN HCl 12.02-999 MG TABS Take 1 tablet by mouth 2 (two) times daily.   1  . aspirin EC 81 MG tablet Take 81 mg by mouth daily.    Marland Kitchen. gabapentin (NEURONTIN) 300 MG  capsule Take 1 capsule (300 mg total) by mouth 3 (three) times daily. 90 capsule 3  . insulin NPH-insulin regular (NOVOLIN 70/30) (70-30) 100 UNIT/ML injection Inject 66 Units into the skin 2 (two) times daily.     Marland Kitchen losartan (COZAAR) 50 MG tablet Take 50 mg by mouth daily.  4  . nitroGLYCERIN (NITROSTAT) 0.4 MG SL tablet Place 0.4 mg under the tongue every 5 (five) minutes as needed for chest pain. Dissolve one tablet under the tongue every 5 mintues as needed for chest pain.    Letta Pate VERIO test strip See admin instructions.  2  . RELION INSULIN SYRINGE 1ML/31G 31G X 5/16" 1 ML MISC USE 1 TWICE DAILY AS DIRECTED  2  . simvastatin (ZOCOR) 20 MG tablet Take 20 mg by mouth at bedtime.      Marland Kitchen zolpidem (AMBIEN CR) 6.25 MG CR tablet Take 1 tablet (6.25 mg total) by mouth at bedtime as needed for sleep. 30 tablet 0   No current  facility-administered medications for this visit.     REVIEW OF SYSTEMS:  [X]  denotes positive finding, [ ]  denotes negative finding Cardiac  Comments:  Chest pain or chest pressure: x   Shortness of breath upon exertion:    Short of breath when lying flat:    Irregular heart rhythm:        Vascular    Pain in calf, thigh, or hip brought on by ambulation:    Pain in feet at night that wakes you up from your sleep:     Blood clot in your veins:    Leg swelling:  x       Pulmonary    Oxygen at home:    Productive cough:     Wheezing:         Neurologic    Sudden weakness in arms or legs:     Sudden numbness in arms or legs:     Sudden onset of difficulty speaking or slurred speech:    Temporary loss of vision in one eye:     Problems with dizziness:         Gastrointestinal    Blood in stool:     Vomited blood:         Genitourinary    Burning when urinating:  x   Blood in urine:        Psychiatric    Major depression:         Hematologic    Bleeding problems:    Problems with blood clotting too easily:        Skin    Rashes or ulcers:        Constitutional    Fever or chills:      PHYSICAL EXAM: Vitals:   09/15/18 1029  BP: (!) 159/80  Temp: 98.7 F (37.1 C)  Weight: 180 lb 6 oz (81.8 kg)  Height: 5\' 5"  (1.651 m)    GENERAL: The patient is a well-nourished female, in no acute distress. The vital signs are documented above. CARDIOVASCULAR: Do not hear carotid bruits today.  She has 2+ radial and 2+ dorsalis pedis pulses bilaterally.  Does have some calf varicosities on the right with a visible vein on the right PULMONARY: There is good air exchange  ABDOMEN: Soft and non-tender  MUSCULOSKELETAL: There are no major deformities or cyanosis. NEUROLOGIC: No focal weakness or paresthesias are detected. SKIN: There are no ulcers or rashes noted. PSYCHIATRIC: The patient has a normal affect.  DATA:  I reviewed her carotid duplex from 04/14/2017.  This shows  some elevated velocities in the left internal carotid with interpretation being 50 to 69% stenosis.  MEDICAL ISSUES: Moderate asymptomatic left carotid stenosis.  I discussed this at length with the patient.  Explained that this does not put her in any significantly increased risk for stroke currently.  I have recommended that we continue to monitor this with yearly ultrasounds.  I did review of stroke with her and she knows to report immediately to the hospital should this occur.  Otherwise we will see her in our [place] office in 1 year for repeat duplex  Does report that she is still having some chest pain.  Explained her that she needs to proceed immediately to the emergency room should this become severe.  She is having ongoing work-up through [facility]   Larina Earthly, MD [facility] Tel [phone] Pager [phone]   g

## 2018-10-10 ENCOUNTER — Telehealth (INDEPENDENT_AMBULATORY_CARE_PROVIDER_SITE_OTHER): Payer: Self-pay | Admitting: Radiology

## 2018-10-10 NOTE — Telephone Encounter (Signed)
I called and r/s till 10/28/18 @ 945

## 2018-10-10 NOTE — Telephone Encounter (Signed)
Patient left message on Dr. Orrtanna BlasNewton's area voicemail.  Received called needing to reschedule her appointment with Dr. Otelia SergeantNitka on 10/17/18 due to him not being in office. Please call back [phone]

## 2018-10-17 ENCOUNTER — Ambulatory Visit (INDEPENDENT_AMBULATORY_CARE_PROVIDER_SITE_OTHER): Payer: Medicare HMO | Admitting: Specialist

## 2018-10-23 DIAGNOSIS — R309 Painful micturition, unspecified: Secondary | ICD-10-CM | POA: Diagnosis not present

## 2018-10-23 DIAGNOSIS — R319 Hematuria, unspecified: Secondary | ICD-10-CM | POA: Diagnosis not present

## 2018-10-23 DIAGNOSIS — Z6827 Body mass index (BMI) 27.0-27.9, adult: Secondary | ICD-10-CM | POA: Diagnosis not present

## 2018-10-23 DIAGNOSIS — E1142 Type 2 diabetes mellitus with diabetic polyneuropathy: Secondary | ICD-10-CM | POA: Diagnosis not present

## 2018-10-23 DIAGNOSIS — R300 Dysuria: Secondary | ICD-10-CM | POA: Diagnosis not present

## 2018-10-23 DIAGNOSIS — E669 Obesity, unspecified: Secondary | ICD-10-CM | POA: Diagnosis not present

## 2018-10-28 ENCOUNTER — Encounter (INDEPENDENT_AMBULATORY_CARE_PROVIDER_SITE_OTHER): Payer: Self-pay | Admitting: Specialist

## 2018-10-28 ENCOUNTER — Ambulatory Visit (INDEPENDENT_AMBULATORY_CARE_PROVIDER_SITE_OTHER): Payer: Medicare HMO | Admitting: Specialist

## 2018-10-28 VITALS — BP 135/98 | HR 101 | Ht 65.0 in | Wt 183.0 lb

## 2018-10-28 DIAGNOSIS — M4325 Fusion of spine, thoracolumbar region: Secondary | ICD-10-CM | POA: Diagnosis not present

## 2018-10-28 DIAGNOSIS — G629 Polyneuropathy, unspecified: Secondary | ICD-10-CM | POA: Diagnosis not present

## 2018-10-28 MED ORDER — BACLOFEN 10 MG PO TABS: 10.0000 mg | ORAL_TABLET | Freq: Three times a day (TID) | ORAL | 0 refills | Status: DC

## 2018-10-28 MED ORDER — GABAPENTIN 300 MG PO CAPS: 300.0000 mg | ORAL_CAPSULE | Freq: Three times a day (TID) | ORAL | 3 refills | Status: DC

## 2018-10-28 NOTE — Patient Instructions (Addendum)
Plan: Avoid frequent bending and stooping  No lifting greater than 10 lbs. May use ice or moist heat for pain. Weight loss is of benefit. Best medication for lumbar disc disease is arthritis medications like advil or alleve. Exercise is important to improve your indurance and does allow people to function better inspite of back pain. CBD oil capsules, [email or web address] [email or web address] 5,000 to 7,000mg  take one or two capsules a day. Creel oil. Vitamin B complex.

## 2018-10-28 NOTE — Progress Notes (Signed)
Office Visit Note   Patient: [name]           Date of Birth: [birth year]           MRN: [id number] Visit Date: 10/28/2018              Requested by: Mirna Mires, MD [address] [place], [place] [address] PCP: Mirna Mires, MD   Assessment & Plan: Visit Diagnoses:  1. Fusion of spine of thoracolumbar region     Plan: Avoid frequent bending and stooping  No lifting greater than 10 lbs. May use ice or moist heat for pain. Weight loss is of benefit. Best medication for lumbar disc disease is arthritis medications like motrin, celebrex and naprosyn. Exercise is important to improve your indurance and does allow people to function better inspite of back pain. CBD oil capsules, online amazon.com 5,000 to 7,000mg  take one or two capsules a day. Creel oil. Vitamin B complex.  Follow-Up Instructions: No follow-ups on file.   Orders:  No orders of the defined types were placed in this encounter.  Meds ordered this encounter  Medications  . baclofen (LIORESAL) 10 MG tablet    Sig: Take 1 tablet (10 mg total) by mouth 3 (three) times daily.    Dispense:  30 each    Refill:  0  . gabapentin (NEURONTIN) 300 MG capsule    Sig: Take 1 capsule (300 mg total) by mouth 3 (three) times daily.    Dispense:  90 capsule    Refill:  3      Procedures: No procedures performed   Clinical Data: No additional findings.   Subjective: Chief Complaint  Patient presents with  . Spine - Follow-up    8 months following extension of lumbar fusion upwards to the T10 level. She has been having right upper buttock pain pain that constant and into the right upper thigh. She has some persistent numbness in the right leg and right foot dorsally and into the right great toe.  No bowel or bladder difficulty.   Review of Systems  Constitutional: Negative.   HENT: Negative.   Eyes: Negative.   Respiratory: Negative.   Cardiovascular: Negative.   Gastrointestinal:  Negative.   Endocrine: Negative.   Genitourinary: Negative.   Musculoskeletal: Negative.   Skin: Negative.   Allergic/Immunologic: Negative.   Neurological: Negative.   Hematological: Negative.   Psychiatric/Behavioral: Negative.      Objective: Vital Signs: BP (!) 135/98   Pulse (!) 101   Ht 5\' 5"  (1.651 m)   Wt 183 lb (83 kg)   BMI 30.45 kg/m   Physical Exam Constitutional:      Appearance: She is well-developed.  HENT:     Head: Normocephalic and atraumatic.  Eyes:     Pupils: Pupils are equal, round, and reactive to light.  Neck:     Musculoskeletal: Normal range of motion and neck supple.  Pulmonary:     Effort: Pulmonary effort is normal.     Breath sounds: Normal breath sounds.  Abdominal:     General: Bowel sounds are normal.     Palpations: Abdomen is soft.  Skin:    General: Skin is warm and dry.  Neurological:     Mental Status: She is alert and oriented to person, place, and time.  Psychiatric:        Behavior: Behavior normal.        Thought Content: Thought content normal.  Judgment: Judgment normal.     Back Exam   Tenderness  The patient is experiencing tenderness in the lumbar.  Range of Motion  Lateral bend right: abnormal  Lateral bend left: abnormal  Rotation right: abnormal  Rotation left: abnormal   Muscle Strength  Right Quadriceps:  5/5  Left Quadriceps:  5/5  Right Hamstrings:  5/5  Left Hamstrings:  5/5   Tests  Straight leg raise right: negative Straight leg raise left: negative  Other  Toe walk: normal Heel walk: normal Sensation: normal Gait: normal  Erythema: no back redness Scars: absent      Specialty Comments:  No specialty comments available.  Imaging: No results found.   PMFS History: Patient Active Problem List   Diagnosis Date Noted  . Acute blood loss as cause of postoperative anemia 03/07/2018    Priority: High    Class: Acute  . Spinal stenosis, lumbar region with neurogenic  claudication 03/04/2018    Priority: High    Class: Chronic  . Multilevel degenerative disc disease 03/04/2018    Priority: High    Class: Chronic  . Forestier's disease of thoracolumbar region 03/04/2018    Priority: High    Class: Chronic  . Dysphagia 08/11/2018  . Leg swelling 08/11/2018  . Bilateral carotid bruits 08/11/2018  . Abnormal EKG 08/06/2018  . Fusion of spine of thoracolumbar region 03/04/2018  . Pain in right hip 03/18/2017  . Stiffness of joint, not elsewhere classified, other specified site 08/04/2013  . Leg weakness, bilateral 08/04/2013  . CHEST PAIN UNSPECIFIED 05/10/2009  . BENIGN POSITIONAL VERTIGO 07/30/2007  . VARICOSE VEINS, LOWER EXTREMITIES 07/30/2007  . SCIATICA, CHRONIC 07/30/2007  . DIABETES MELLITUS, TYPE II 11/18/2006  . Hyperlipidemia 11/18/2006  . MIGRAINE HEADACHE 11/18/2006  . LESION, SCIATIC NERVE 11/18/2006  . Essential hypertension 11/18/2006  . HYPOTENSION, ORTHOSTATIC 11/18/2006  . BRONCHITIS NOS 11/18/2006  . Other emphysema (HCC) 11/18/2006  . ASTHMA 11/18/2006  . OSTEOARTHRITIS 11/18/2006  . LOW BACK PAIN 11/18/2006  . VERTIGO 11/18/2006   Past Medical History:  Diagnosis Date  . Asthma   . Bronchitis   . Diabetes mellitus   . Hyperlipidemia   . Hypertension   . Low back pain   . Migraine   . Osteoarthritis   . Sciatica   . Varicose veins   . Vertigo     Family History  Problem Relation Age of Onset  . Lung cancer Mother   . Stroke Father   . Heart failure Sister   . Heart failure Sister     Past Surgical History:  Procedure Laterality Date  . ABDOMINAL HYSTERECTOMY    . CARPAL TUNNEL RELEASE    . CESAREAN SECTION    . INGUINAL HERNIA REPAIR    . LUMBAR FUSION    . POSTERIOR LUMBAR FUSION 4 LEVEL N/A 03/04/2018   Procedure: Right transforaminal lumbar interbody fusion L1-2, L2-3 Posterior fusion T10, T11, T12, L1, L2 with T 11, T12 pedicle screws, superior sublaminar hooks T10, local bone graft, allograft  cancellous chips Vivigen;  Surgeon: Kerrin Champagne, MD;  Location: [facility] OR;  Service: Orthopedics;  Laterality: N/A;   Social History   Occupational History  . Occupation: Disabled    Employer: UNEMPLOYED  Tobacco Use  . Smoking status: Never Smoker  . Smokeless tobacco: Never Used  Substance and Sexual Activity  . Alcohol use: No    Alcohol/week: 0.0 standard drinks  . Drug use: No  . Sexual activity: Not on file

## 2018-11-12 ENCOUNTER — Telehealth: Payer: Self-pay | Admitting: *Deleted

## 2018-11-12 ENCOUNTER — Encounter: Payer: Self-pay | Admitting: Nurse Practitioner

## 2018-11-12 ENCOUNTER — Encounter: Payer: Self-pay | Admitting: *Deleted

## 2018-11-12 ENCOUNTER — Other Ambulatory Visit: Payer: Self-pay | Admitting: *Deleted

## 2018-11-12 ENCOUNTER — Ambulatory Visit: Payer: Medicare HMO | Admitting: Nurse Practitioner

## 2018-11-12 VITALS — BP 176/84 | HR 81 | Temp 99.0°F | Ht 65.0 in | Wt 179.6 lb

## 2018-11-12 DIAGNOSIS — R1310 Dysphagia, unspecified: Secondary | ICD-10-CM

## 2018-11-12 NOTE — Assessment & Plan Note (Signed)
The patient describes solid food dysphagia about every other week.  She also describes pill dysphagia about every other day.  When she does have dysphagia symptoms she has associated odynophagia.  She denies overt GERD symptoms, but she is on a PPI.  She also has some epigastric tenderness on exam today.  We will proceed with EGD to further evaluate her dysphagia symptoms as well as any other etiologies behind persistent epigastric pain without other dyspepsia symptoms, while on PPI.  Query gastritis, possibly H. Pylori.  Proceed with EGD on propofol/MAC with Dr. Oneida Alar in near future: the risks, benefits, and alternatives have been discussed with the patient in detail. The patient states understanding and desires to proceed.  The patient is currently on Neurontin and Ambien.  No other anticoagulants, anxiolytics, chronic pain medications, or antidepressants.  We will plan for the procedure on propofol/MAC to promote adequate sedation.

## 2018-11-12 NOTE — Patient Instructions (Signed)
Your health issues we discussed today were:   Swallowing issues (dysphagia) with foods and pills: 1. We will schedule an upper endoscopy with possible dilation to help your symptoms 2. This will allow [place] to evaluate for any other causes of upper abdominal pain as well 3. Further recommendations will be made after your upper endoscopy 4. Continue your current medications, including Protonix  Overall I recommend:  1. Follow-up in 3 months 2. Call us if you have any questions or concerns  At [facility] we value your feedback. You may receive a survey about your visit today. Please share your experience as we strive to create trusting relationships with our patients to provide genuine, compassionate, quality care.  We appreciate your understanding and patience as we review any laboratory studies, imaging, and other diagnostic tests that are ordered as we care for you. Our office policy is 5 business days for review of these results, and any emergent or urgent results are addressed in a timely manner for your best interest. If you do not hear from our office in 1 week, please contact us.   We also encourage the use of MyChart, which contains your medical information for your review as well. If you are not enrolled in this feature, an access code is on this after visit summary for your convenience. Thank you for allowing [place] to be involved in your care.  It was great to see you today!  I hope you have a great day!!

## 2018-11-12 NOTE — Progress Notes (Addendum)
REVIEWED-[facility] AFTER JUN 1 DUE TO COVID 19 RESTRICTIONS. PT SHOULD FOLLOW A SOFT MECHANICAL DIET UNTIL EGD IS COMPLETE.  MEATS SHOULD BE CHOPPED OR GROUND ONLY. DO NOT EAT CHUNKS OF ANYTHING.  Primary Care Physician:  Mirna Mires, MD Primary Gastroenterologist:  Dr. Darrick Penna  Chief Complaint  Patient presents with  . Dysphagia    food and pills    HPI:   [name] is a 68 y.o. female who presents on referral from cardiology for complaints of dysphagia.  Reviewed information provided with referral including office visit dated 08/11/2018.  This is a cardiology visit for abnormal EKG, dysphagia, and others.  At that time she noted choking on food episodes and has to wash everything down with water or soda.  The day prior to her office visit she tried eating chicken and green beans and had severe epigastric pain and difficulty with passing the food.  Residual pain lasted the rest of the day.  Noted previous stress test in 08/2017 was low risk.  EKG in the office with her cardiology appointment was normal.  No history of endoscopy in our system.  Today she states she's doin gok overall. Has been having solid food dysphagia with associated odynophagia for years; worsened last year after back surgery. Denies GERD symptoms. Has dysphagia symptoms about every other week, has associated odynophagia and generally passes with chasing with liquids. Also with more frequent pill dysphagia, typically occurs every other day; passes with a lot of water. Denies abdominal pain, hematochezia, melena, fever, chills. Has lost about 15 lbs in the past 2 weeks associated with decreased appetite. Has not had a colonoscopy before and declines colonoscopy saying "I don't want one." Denies chest pain, dyspnea, dizziness, lightheadedness, syncope, near syncope. Denies any other upper or lower GI symptoms.  Past Medical History:  Diagnosis Date  . Asthma   . Bronchitis   . Diabetes mellitus   .  Hyperlipidemia   . Hypertension   . Low back pain   . Migraine   . Osteoarthritis   . Sciatica   . Varicose veins   . Vertigo     Past Surgical History:  Procedure Laterality Date  . ABDOMINAL HYSTERECTOMY    . CARPAL TUNNEL RELEASE    . CESAREAN SECTION    . INGUINAL HERNIA REPAIR    . LUMBAR FUSION    . POSTERIOR LUMBAR FUSION 4 LEVEL N/A 03/04/2018   Procedure: Right transforaminal lumbar interbody fusion L1-2, L2-3 Posterior fusion T10, T11, T12, L1, L2 with T 11, T12 pedicle screws, superior sublaminar hooks T10, local bone graft, allograft cancellous chips Vivigen;  Surgeon: Kerrin Champagne, MD;  Location: [facility] OR;  Service: Orthopedics;  Laterality: N/A;    Current Outpatient Medications  Medication Sig Dispense Refill  . Alogliptin-metFORMIN HCl 12.02-999 MG TABS Take 1 tablet by mouth 2 (two) times daily.   1  . amLODipine (NORVASC) 5 MG tablet Take 5 mg by mouth daily.    Marland Kitchen aspirin EC 81 MG tablet Take 81 mg by mouth 2 (two) times daily.     . baclofen (LIORESAL) 10 MG tablet Take 1 tablet (10 mg total) by mouth 3 (three) times daily. 30 each 0  . gabapentin (NEURONTIN) 300 MG capsule Take 1 capsule (300 mg total) by mouth 3 (three) times daily. 90 capsule 3  . insulin NPH-insulin regular (NOVOLIN 70/30) (70-30) 100 UNIT/ML injection Inject 66 Units into the skin 2 (two) times daily.     Marland Kitchen  losartan (COZAAR) 50 MG tablet Take 50 mg by mouth daily.  4  . metFORMIN (GLUCOPHAGE) 1000 MG tablet Take 1,000 mg by mouth at bedtime.    . nitroGLYCERIN (NITROSTAT) 0.4 MG SL tablet Place 0.4 mg under the tongue every 5 (five) minutes as needed for chest pain. Dissolve one tablet under the tongue every 5 mintues as needed for chest pain.    Letta Pate VERIO test strip See admin instructions.  2  . pantoprazole (PROTONIX) 40 MG tablet Take 40 mg by mouth daily.    Marland Kitchen RELION INSULIN SYRINGE 1ML/31G 31G X 5/16" 1 ML MISC USE 1 TWICE DAILY AS DIRECTED  2  . simvastatin (ZOCOR) 20 MG tablet  Take 20 mg by mouth at bedtime.      Marland Kitchen zolpidem (AMBIEN CR) 6.25 MG CR tablet Take 1 tablet (6.25 mg total) by mouth at bedtime as needed for sleep. 30 tablet 0   No current facility-administered medications for this visit.     Allergies as of 11/12/2018  . (No Known Allergies)    Family History  Problem Relation Age of Onset  . Lung cancer Mother   . Stroke Father   . Heart failure Sister   . Heart failure Sister   . Colon cancer Neg Hx   . Gastric cancer Neg Hx   . Esophageal cancer Neg Hx     Social History   Socioeconomic History  . Marital status: Single    Spouse name: Not on file  . Number of children: Not on file  . Years of education: 12th   . Highest education level: Not on file  Occupational History  . Occupation: Disabled    Associate Professor: UNEMPLOYED  Social Needs  . Financial resource strain: Not on file  . Food insecurity:    Worry: Not on file    Inability: Not on file  . Transportation needs:    Medical: Not on file    Non-medical: Not on file  Tobacco Use  . Smoking status: Never Smoker  . Smokeless tobacco: Never Used  Substance and Sexual Activity  . Alcohol use: No    Alcohol/week: 0.0 standard drinks  . Drug use: No  . Sexual activity: Not on file  Lifestyle  . Physical activity:    Days per week: Not on file    Minutes per session: Not on file  . Stress: Not on file  Relationships  . Social connections:    Talks on phone: Not on file    Gets together: Not on file    Attends religious service: Not on file    Active member of club or organization: Not on file    Attends meetings of clubs or organizations: Not on file    Relationship status: Not on file  . Intimate partner violence:    Fear of current or ex partner: Not on file    Emotionally abused: Not on file    Physically abused: Not on file    Forced sexual activity: Not on file  Other Topics Concern  . Not on file  Social History Narrative   No caffeine use     Review of  Systems: Complete ROS negative except as per HPI.    Physical Exam: BP (!) 176/84   Pulse 81   Temp 99 F (37.2 C) (Oral)   Ht 5\' 5"  (1.651 m)   Wt 179 lb 9.6 oz (81.5 kg)   BMI 29.89 kg/m  General:   Alert and  oriented. Pleasant and cooperative. Well-nourished and well-developed.  Head:  Normocephalic and atraumatic. Eyes:  Without icterus, sclera clear and conjunctiva pink.  Ears:  Normal auditory acuity. Cardiovascular:  S1, S2 present without murmurs appreciated. Extremities without clubbing or edema. Respiratory:  Clear to auscultation bilaterally. No wheezes, rales, or rhonchi. No distress.  Gastrointestinal:  +BS, soft, and non-distended. Mild to moderate epigastric TTP. No HSM noted. No guarding or rebound. No masses appreciated.  Rectal:  Deferred  Musculoskalatal:  Symmetrical without gross deformities. Neurologic:  Alert and oriented x4;  grossly normal neurologically. Psych:  Alert and cooperative. Normal mood and affect. Heme/Lymph/Immune: No excessive bruising noted.    11/12/2018 11:21 AM   Disclaimer: This note was dictated with voice recognition software. Similar sounding words can inadvertently be transcribed and may not be corrected upon review.

## 2018-11-12 NOTE — Progress Notes (Signed)
cc'd to pcp 

## 2018-11-12 NOTE — Telephone Encounter (Signed)
Pre-op scheduled for 01/28/2019 at 10:00am. Letter mailed. LMTCB

## 2018-11-18 ENCOUNTER — Encounter: Payer: Self-pay | Admitting: Cardiovascular Disease

## 2018-11-18 ENCOUNTER — Ambulatory Visit: Payer: Medicare HMO | Admitting: Cardiovascular Disease

## 2018-11-18 VITALS — BP 142/70 | HR 88 | Ht 65.0 in | Wt 176.0 lb

## 2018-11-18 DIAGNOSIS — R079 Chest pain, unspecified: Secondary | ICD-10-CM

## 2018-11-18 DIAGNOSIS — M7989 Other specified soft tissue disorders: Secondary | ICD-10-CM

## 2018-11-18 DIAGNOSIS — I10 Essential (primary) hypertension: Secondary | ICD-10-CM

## 2018-11-18 DIAGNOSIS — R1310 Dysphagia, unspecified: Secondary | ICD-10-CM | POA: Diagnosis not present

## 2018-11-18 DIAGNOSIS — I6522 Occlusion and stenosis of left carotid artery: Secondary | ICD-10-CM

## 2018-11-18 DIAGNOSIS — E785 Hyperlipidemia, unspecified: Secondary | ICD-10-CM | POA: Diagnosis not present

## 2018-11-18 NOTE — Patient Instructions (Signed)
Medication Instructions:  Your physician recommends that you continue on your current medications as directed. Please refer to the Current Medication list given to you today.  If you need a refill on your cardiac medications before your next appointment, please call your pharmacy.   Lab work: NONE  If you have labs (blood work) drawn today and your tests are completely normal, you will receive your results only by: . MyChart Message (if you have MyChart) OR . A paper copy in the mail If you have any lab test that is abnormal or we need to change your treatment, we will call you to review the results.  Testing/Procedures: NONE   Follow-Up: At [facility], you and your health needs are our priority.  As part of our continuing mission to provide you with exceptional heart care, we have created designated Provider Care Teams.  These Care Teams include your primary Cardiologist (physician) and Advanced Practice Providers (APPs -  Physician Assistants and Nurse Practitioners) who all work together to provide you with the care you need, when you need it. You will need a follow up appointment as needed .  Please call our office 2 months in advance to schedule this appointment.  You may see Suresh Koneswaran, MD or one of the following Advanced Practice Providers on your designated Care Team:   Brittany Strader, PA-C ([place] Office) . Michele Lenze, PA-C ([place] Office)  Any Other Special Instructions Will Be Listed Below (If Applicable). Thank you for choosing [facility]!     

## 2018-11-18 NOTE — Progress Notes (Signed)
SUBJECTIVE: The patient presents for routine follow-up.  She has a history of dietary indiscretion with high sodium consumption and ankle swelling related to this.  She also has left internal carotid artery stenosis of 50 to 69% by carotid Dopplers on 08/15/2018 and is followed by vascular surgery.  I last saw her in October 2018.  She underwent a low risk nuclear stress test on 08/16/2017, EF 55 to 65%.  There was a small mild intensity reversible anterior defect suggestive of mild ischemia versus differences in breast attenuation.  Echocardiogram on 08/15/2018 showed normal left ventricular systolic function and regional wall motion, LVEF 55 to 60%.  She told me that foods get stuck in her throat about halfway down and cause pain.  She can also have pain when swallowing pills depending upon the number of pills she is taking.  She was evaluated by GI and is scheduled to undergo an EGD on 02/03/2019.  She also describes retrosternal chest pains when lying down at night.  She denies exertional dyspnea and diaphoresis.  Energy levels have remained stable over the past several months.    Review of Systems: As per "subjective", otherwise negative.  No Known Allergies  Current Outpatient Medications  Medication Sig Dispense Refill  . Alogliptin-metFORMIN HCl 12.02-999 MG TABS Take 1 tablet by mouth 2 (two) times daily.   1  . amLODipine (NORVASC) 5 MG tablet Take 5 mg by mouth daily.    Marland Kitchen. aspirin EC 81 MG tablet Take 81 mg by mouth 2 (two) times daily.     . baclofen (LIORESAL) 10 MG tablet Take 1 tablet (10 mg total) by mouth 3 (three) times daily. 30 each 0  . gabapentin (NEURONTIN) 300 MG capsule Take 1 capsule (300 mg total) by mouth 3 (three) times daily. 90 capsule 3  . insulin NPH-insulin regular (NOVOLIN 70/30) (70-30) 100 UNIT/ML injection Inject 66 Units into the skin 2 (two) times daily.     Marland Kitchen. losartan (COZAAR) 50 MG tablet Take 50 mg by mouth daily.  4  . metFORMIN  (GLUCOPHAGE) 1000 MG tablet Take 1,000 mg by mouth at bedtime.    . nitroGLYCERIN (NITROSTAT) 0.4 MG SL tablet Place 0.4 mg under the tongue every 5 (five) minutes as needed for chest pain. Dissolve one tablet under the tongue every 5 mintues as needed for chest pain.    Letta Pate. ONETOUCH VERIO test strip See admin instructions.  2  . pantoprazole (PROTONIX) 40 MG tablet Take 40 mg by mouth daily.    Marland Kitchen. RELION INSULIN SYRINGE 1ML/31G 31G X 5/16" 1 ML MISC USE 1 TWICE DAILY AS DIRECTED  2  . simvastatin (ZOCOR) 20 MG tablet Take 20 mg by mouth at bedtime.      Marland Kitchen. zolpidem (AMBIEN CR) 6.25 MG CR tablet Take 1 tablet (6.25 mg total) by mouth at bedtime as needed for sleep. 30 tablet 0   No current facility-administered medications for this visit.     Past Medical History:  Diagnosis Date  . Asthma   . Bronchitis   . Diabetes mellitus   . Hyperlipidemia   . Hypertension   . Low back pain   . Migraine   . Osteoarthritis   . Sciatica   . Varicose veins   . Vertigo     Past Surgical History:  Procedure Laterality Date  . ABDOMINAL HYSTERECTOMY    . CARPAL TUNNEL RELEASE    . CESAREAN SECTION    . INGUINAL HERNIA REPAIR    .  LUMBAR FUSION    . POSTERIOR LUMBAR FUSION 4 LEVEL N/A 03/04/2018   Procedure: Right transforaminal lumbar interbody fusion L1-2, L2-3 Posterior fusion T10, T11, T12, L1, L2 with T 11, T12 pedicle screws, superior sublaminar hooks T10, local bone graft, allograft cancellous chips Vivigen;  Surgeon: Kerrin Champagne, MD;  Location: [facility] OR;  Service: Orthopedics;  Laterality: N/A;    Social History   Socioeconomic History  . Marital status: Single    Spouse name: Not on file  . Number of children: Not on file  . Years of education: 12th   . Highest education level: Not on file  Occupational History  . Occupation: Disabled    Associate Professor: UNEMPLOYED  Social Needs  . Financial resource strain: Not on file  . Food insecurity:    Worry: Not on file    Inability: Not on file   . Transportation needs:    Medical: Not on file    Non-medical: Not on file  Tobacco Use  . Smoking status: Never Smoker  . Smokeless tobacco: Never Used  Substance and Sexual Activity  . Alcohol use: No    Alcohol/week: 0.0 standard drinks  . Drug use: No  . Sexual activity: Not on file  Lifestyle  . Physical activity:    Days per week: Not on file    Minutes per session: Not on file  . Stress: Not on file  Relationships  . Social connections:    Talks on phone: Not on file    Gets together: Not on file    Attends religious service: Not on file    Active member of club or organization: Not on file    Attends meetings of clubs or organizations: Not on file    Relationship status: Not on file  . Intimate partner violence:    Fear of current or ex partner: Not on file    Emotionally abused: Not on file    Physically abused: Not on file    Forced sexual activity: Not on file  Other Topics Concern  . Not on file  Social History Narrative   No caffeine use      Vitals:   11/18/18 1037  BP: (!) 142/70  Pulse: 88  SpO2: 93%  Weight: 176 lb (79.8 kg)  Height: 5\' 5"  (1.651 m)    Wt Readings from Last 3 Encounters:  11/18/18 176 lb (79.8 kg)  11/12/18 179 lb 9.6 oz (81.5 kg)  10/28/18 183 lb (83 kg)     PHYSICAL EXAM General: NAD HEENT: Normal. Neck: No JVD, no thyromegaly. Lungs: Clear to auscultation bilaterally with normal respiratory effort. CV: Regular rate and rhythm, normal S1/S2, no S3/S4, no murmur. No pretibial or periankle edema.  No carotid bruit.   Abdomen: Soft, nontender, no distention.  Neurologic: Alert and oriented.  Psych: Normal affect. Skin: Normal. Musculoskeletal: No gross deformities.    ECG: Reviewed above under Subjective   Labs: Lab Results  Component Value Date/Time   K 4.3 03/06/2018 10:13 AM   BUN 11 03/06/2018 10:13 AM   CREATININE 0.79 03/06/2018 10:13 AM   ALT 16 02/28/2018 12:06 PM   TSH 0.443 (L) 04/11/2015 02:29  PM   HGB 9.2 (L) 03/06/2018 04:50 PM     Lipids: Lab Results  Component Value Date/Time   LDLCALC 94 11/03/2009   CHOL 157 11/03/2009   TRIG 171 11/03/2009   HDL 29 11/03/2009       ASSESSMENT AND PLAN: 1.  Bilateral leg edema:  Secondary to dietary indiscretion.  She is euvolemic today.  Left ventricular systolic function is normal.  Blood pressure is mildly elevated which will need further monitoring.  2.  Left internal carotid artery stenosis: 50 to 69% stenosis by most recent carotid Dopplers.  Followed by vascular surgery.  Currently on aspirin 81 mg and simvastatin.  3.  Hypertension: Blood pressure is mildly elevated today.  This will need further monitoring.  4.  Hyperlipidemia: Currently on simvastatin.  5.  Dysphagia for solids/odynophagia: Scheduled to undergo an EGD on 02/03/2019.    Disposition: Follow up with me as needed   Prentice Docker, M.D., F.A.C.C.

## 2018-11-24 ENCOUNTER — Other Ambulatory Visit (INDEPENDENT_AMBULATORY_CARE_PROVIDER_SITE_OTHER): Payer: Self-pay | Admitting: Specialist

## 2018-11-25 NOTE — Telephone Encounter (Signed)
Baclofen and zolpidem refill request

## 2018-12-08 DIAGNOSIS — R51 Headache: Secondary | ICD-10-CM | POA: Diagnosis not present

## 2018-12-08 DIAGNOSIS — E1165 Type 2 diabetes mellitus with hyperglycemia: Secondary | ICD-10-CM | POA: Diagnosis not present

## 2018-12-08 DIAGNOSIS — R21 Rash and other nonspecific skin eruption: Secondary | ICD-10-CM | POA: Diagnosis not present

## 2018-12-08 DIAGNOSIS — I10 Essential (primary) hypertension: Secondary | ICD-10-CM | POA: Diagnosis not present

## 2018-12-14 DIAGNOSIS — R69 Illness, unspecified: Secondary | ICD-10-CM | POA: Diagnosis not present

## 2018-12-22 DIAGNOSIS — R10819 Abdominal tenderness, unspecified site: Secondary | ICD-10-CM | POA: Diagnosis not present

## 2018-12-22 DIAGNOSIS — R300 Dysuria: Secondary | ICD-10-CM | POA: Diagnosis not present

## 2018-12-22 DIAGNOSIS — R079 Chest pain, unspecified: Secondary | ICD-10-CM | POA: Diagnosis not present

## 2018-12-22 DIAGNOSIS — I10 Essential (primary) hypertension: Secondary | ICD-10-CM | POA: Diagnosis not present

## 2018-12-22 DIAGNOSIS — E1169 Type 2 diabetes mellitus with other specified complication: Secondary | ICD-10-CM | POA: Diagnosis not present

## 2018-12-22 DIAGNOSIS — R309 Painful micturition, unspecified: Secondary | ICD-10-CM | POA: Diagnosis not present

## 2019-01-20 ENCOUNTER — Telehealth: Payer: Self-pay | Admitting: *Deleted

## 2019-01-20 DIAGNOSIS — E1169 Type 2 diabetes mellitus with other specified complication: Secondary | ICD-10-CM | POA: Diagnosis not present

## 2019-01-20 DIAGNOSIS — Z7189 Other specified counseling: Secondary | ICD-10-CM | POA: Diagnosis not present

## 2019-01-20 DIAGNOSIS — R21 Rash and other nonspecific skin eruption: Secondary | ICD-10-CM | POA: Diagnosis not present

## 2019-01-20 DIAGNOSIS — I10 Essential (primary) hypertension: Secondary | ICD-10-CM | POA: Diagnosis not present

## 2019-01-20 NOTE — Telephone Encounter (Signed)
Per [facility]: REVIEWED-[facility] TCS AFTER JUN 1 DUE TO COVID 19 RESTRICTIONS. PT SHOULD FOLLOW A SOFT MECHANICAL DIET UNTIL EGD IS COMPLETE.  MEATS SHOULD BE CHOPPED OR GROUND ONLY. DO NOT EAT CHUNKS OF ANYTHING.  Patient aware of above and will call back to r/s once we receive July schedule.

## 2019-01-21 NOTE — Telephone Encounter (Signed)
Spoke with pt and she is r/s'd to 7/28 at 11:00am. Patient aware will mail new instructions/pre-op appt.

## 2019-01-28 ENCOUNTER — Inpatient Hospital Stay (HOSPITAL_COMMUNITY): Admission: RE | Admit: 2019-01-28 | Payer: Medicare HMO | Source: Ambulatory Visit

## 2019-02-09 ENCOUNTER — Other Ambulatory Visit (INDEPENDENT_AMBULATORY_CARE_PROVIDER_SITE_OTHER): Payer: Self-pay | Admitting: Surgery

## 2019-02-09 NOTE — Telephone Encounter (Signed)
Dr. Nitka patient. 

## 2019-02-09 NOTE — Telephone Encounter (Signed)
Methocarbamol refill request 

## 2019-02-11 ENCOUNTER — Ambulatory Visit: Payer: Medicare HMO | Admitting: Nurse Practitioner

## 2019-02-24 DIAGNOSIS — R109 Unspecified abdominal pain: Secondary | ICD-10-CM | POA: Diagnosis not present

## 2019-02-24 DIAGNOSIS — I10 Essential (primary) hypertension: Secondary | ICD-10-CM | POA: Diagnosis not present

## 2019-02-24 DIAGNOSIS — E1169 Type 2 diabetes mellitus with other specified complication: Secondary | ICD-10-CM | POA: Diagnosis not present

## 2019-02-24 DIAGNOSIS — R21 Rash and other nonspecific skin eruption: Secondary | ICD-10-CM | POA: Diagnosis not present

## 2019-02-24 DIAGNOSIS — N390 Urinary tract infection, site not specified: Secondary | ICD-10-CM | POA: Diagnosis not present

## 2019-03-10 DIAGNOSIS — R21 Rash and other nonspecific skin eruption: Secondary | ICD-10-CM | POA: Diagnosis not present

## 2019-03-10 DIAGNOSIS — R1013 Epigastric pain: Secondary | ICD-10-CM | POA: Diagnosis not present

## 2019-03-10 DIAGNOSIS — E1165 Type 2 diabetes mellitus with hyperglycemia: Secondary | ICD-10-CM | POA: Diagnosis not present

## 2019-04-10 ENCOUNTER — Other Ambulatory Visit: Payer: Self-pay

## 2019-04-10 ENCOUNTER — Ambulatory Visit (INDEPENDENT_AMBULATORY_CARE_PROVIDER_SITE_OTHER): Payer: Medicare HMO | Admitting: Cardiovascular Disease

## 2019-04-10 ENCOUNTER — Encounter (INDEPENDENT_AMBULATORY_CARE_PROVIDER_SITE_OTHER): Payer: Self-pay

## 2019-04-10 VITALS — BP 115/70 | HR 70 | Temp 99.2°F | Ht 65.0 in | Wt 183.0 lb

## 2019-04-10 DIAGNOSIS — E785 Hyperlipidemia, unspecified: Secondary | ICD-10-CM | POA: Diagnosis not present

## 2019-04-10 DIAGNOSIS — M7989 Other specified soft tissue disorders: Secondary | ICD-10-CM | POA: Diagnosis not present

## 2019-04-10 DIAGNOSIS — R1310 Dysphagia, unspecified: Secondary | ICD-10-CM

## 2019-04-10 DIAGNOSIS — I6522 Occlusion and stenosis of left carotid artery: Secondary | ICD-10-CM

## 2019-04-10 DIAGNOSIS — R079 Chest pain, unspecified: Secondary | ICD-10-CM | POA: Diagnosis not present

## 2019-04-10 DIAGNOSIS — Z01818 Encounter for other preprocedural examination: Secondary | ICD-10-CM

## 2019-04-10 DIAGNOSIS — R0602 Shortness of breath: Secondary | ICD-10-CM | POA: Diagnosis not present

## 2019-04-10 DIAGNOSIS — I10 Essential (primary) hypertension: Secondary | ICD-10-CM | POA: Diagnosis not present

## 2019-04-10 MED ORDER — METOPROLOL TARTRATE 100 MG PO TABS: ORAL_TABLET | ORAL | 0 refills | Status: DC

## 2019-04-10 NOTE — Patient Instructions (Signed)
Medication Instructions: Your physician recommends that you continue on your current medications as directed. Please refer to the Current Medication list given to you today.   Labwork: none  Procedures/Testing: Your physician has requested that you have cardiac CT at [facility] . Cardiac computed tomography (CT) is a painless test that uses an x-ray machine to take clear, detailed pictures of your heart. For further information please visit [email or web address]. Please follow instruction sheet as given.    Follow-Up: To be determined after cardiac CT at [facility]  Any Additional Special Instructions Will Be Listed Below (If Applicable).     If you need a refill on your cardiac medications before your next appointment, please call your pharmacy.

## 2019-04-10 NOTE — Progress Notes (Signed)
SUBJECTIVE: The patient presents for routine follow-up.  She has a history of dietary indiscretion with high sodium consumption and ankle swelling related to this.  She also has left internal carotid artery stenosis of 50 to 69% by carotid Dopplers on 08/15/2018 and is followed by vascular surgery.  I last saw her on 11/18/2018 and told her to follow-up with me as needed.  She underwent a low risk nuclear stress test on 08/16/2017, EF 55 to 65%.  There was a small mild intensity reversible anterior defect suggestive of mild ischemia versus differences in breast attenuation.  Echocardiogram on 08/15/2018 showed normal left ventricular systolic function and regional wall motion, LVEF 55 to 60%.  She has been having exertional dyspnea over the past 2 weeks.  She has right-sided chest pains with exertion.  It is relieved with rest.  She has tried nitroglycerin prescribed her PCP and it will alleviate her symptoms but then they quickly returned.  She also complains of food getting stuck in her throat about halfway down that also cause chest pain.  She is scheduled to get an EGD on 05/12/2019.    Review of Systems: As per "subjective", otherwise negative.  No Known Allergies  Current Outpatient Medications  Medication Sig Dispense Refill  . amLODipine (NORVASC) 10 MG tablet Take 10 mg by mouth daily.     Marland Kitchen. aspirin 81 MG chewable tablet Chew 81 mg by mouth daily.    . insulin NPH-insulin regular (NOVOLIN 70/30) (70-30) 100 UNIT/ML injection Inject 67 Units into the skin 2 (two) times daily.     Marland Kitchen. losartan (COZAAR) 50 MG tablet Take 50 mg by mouth daily.  4  . metFORMIN (GLUCOPHAGE) 1000 MG tablet Take 1,000 mg by mouth at bedtime.    . nitroGLYCERIN (NITROSTAT) 0.4 MG SL tablet Place 0.4 mg under the tongue every 5 (five) minutes as needed for chest pain. Dissolve one tablet under the tongue every 5 mintues as needed for chest pain.    Letta Pate. ONETOUCH VERIO test strip See admin instructions.  2   . pantoprazole (PROTONIX) 40 MG tablet Take 40 mg by mouth daily.    Marland Kitchen. RELION INSULIN SYRINGE 1ML/31G 31G X 5/16" 1 ML MISC USE 1 TWICE DAILY AS DIRECTED  2  . simvastatin (ZOCOR) 20 MG tablet Take 20 mg by mouth at bedtime.      . TRADJENTA 5 MG TABS tablet TAKE 1 TABLET BY MOUTH ONCE DAILY WITH BREAKFAST     No current facility-administered medications for this visit.     Past Medical History:  Diagnosis Date  . Asthma   . Bronchitis   . Diabetes mellitus   . Hyperlipidemia   . Hypertension   . Low back pain   . Migraine   . Osteoarthritis   . Sciatica   . Varicose veins   . Vertigo     Past Surgical History:  Procedure Laterality Date  . ABDOMINAL HYSTERECTOMY    . CARPAL TUNNEL RELEASE    . CESAREAN SECTION    . INGUINAL HERNIA REPAIR    . LUMBAR FUSION    . POSTERIOR LUMBAR FUSION 4 LEVEL N/A 03/04/2018   Procedure: Right transforaminal lumbar interbody fusion L1-2, L2-3 Posterior fusion T10, T11, T12, L1, L2 with T 11, T12 pedicle screws, superior sublaminar hooks T10, local bone graft, allograft cancellous chips Vivigen;  Surgeon: Kerrin ChampagneNitka, James E, MD;  Location: [facility] OR;  Service: Orthopedics;  Laterality: N/A;    Social History  Socioeconomic History  . Marital status: Single    Spouse name: Not on file  . Number of children: Not on file  . Years of education: 12th   . Highest education level: Not on file  Occupational History  . Occupation: Disabled    Fish farm manager: UNEMPLOYED  Social Needs  . Financial resource strain: Not on file  . Food insecurity    Worry: Not on file    Inability: Not on file  . Transportation needs    Medical: Not on file    Non-medical: Not on file  Tobacco Use  . Smoking status: Never Smoker  . Smokeless tobacco: Never Used  Substance and Sexual Activity  . Alcohol use: No    Alcohol/week: 0.0 standard drinks  . Drug use: No  . Sexual activity: Not on file  Lifestyle  . Physical activity    Days per week: Not on file     Minutes per session: Not on file  . Stress: Not on file  Relationships  . Social Herbalist on phone: Not on file    Gets together: Not on file    Attends religious service: Not on file    Active member of club or organization: Not on file    Attends meetings of clubs or organizations: Not on file    Relationship status: Not on file  . Intimate partner violence    Fear of current or ex partner: Not on file    Emotionally abused: Not on file    Physically abused: Not on file    Forced sexual activity: Not on file  Other Topics Concern  . Not on file  Social History Narrative   No caffeine use      Vitals:   04/10/19 1322  BP: 115/70  Pulse: 70  Temp: 99.2 F (37.3 C)  TempSrc: Temporal  Height: 5\' 5"  (1.651 m)    Wt Readings from Last 3 Encounters:  11/18/18 176 lb (79.8 kg)  11/12/18 179 lb 9.6 oz (81.5 kg)  10/28/18 183 lb (83 kg)     PHYSICAL EXAM General: NAD HEENT: Normal. Neck: No JVD, no thyromegaly. Lungs: Clear to auscultation bilaterally with normal respiratory effort. CV: Regular rate and rhythm, normal S1/S2, no S3/S4, no murmur. No pretibial or periankle edema.    Abdomen: Soft, nontender, no distention.  Neurologic: Alert and oriented.  Psych: Normal affect. Skin: Normal. Musculoskeletal: No gross deformities.    ECG: Reviewed above under Subjective   Labs: Lab Results  Component Value Date/Time   K 4.3 03/06/2018 10:13 AM   BUN 11 03/06/2018 10:13 AM   CREATININE 0.79 03/06/2018 10:13 AM   ALT 16 02/28/2018 12:06 PM   TSH 0.443 (L) 04/11/2015 02:29 PM   HGB 9.2 (L) 03/06/2018 04:50 PM     Lipids: Lab Results  Component Value Date/Time   LDLCALC 94 11/03/2009   CHOL 157 11/03/2009   TRIG 171 11/03/2009   HDL 29 11/03/2009       ASSESSMENT AND PLAN: 1.  Bilateral leg edema: Secondary to dietary indiscretion.  She is euvolemic today.  Left ventricular systolic function is normal.   2.  Left internal carotid artery  stenosis: 50 to 69% stenosis by most recent carotid Dopplers.  Followed by vascular surgery.  Currently on aspirin 81 mg and simvastatin.  3.  Hypertension: Blood pressure is normal. No changes.  4.  Hyperlipidemia: Currently on simvastatin.  5.  Dyspnea on exertion and chest pain: Chest  pains appear to be atypical and that they are right-sided but she does have new onset exertional dyspnea and does have carotid artery stenosis, placing her at high risk for coronary artery disease.  I will proceed with coronary CT angiography.  6.  Dysphagia for solids: Scheduled for an EGD on 05/12/2019.    Disposition: Follow up to be determined   Prentice DockerSuresh Asia Favata, M.D., F.A.C.C.

## 2019-04-13 DIAGNOSIS — E1169 Type 2 diabetes mellitus with other specified complication: Secondary | ICD-10-CM | POA: Diagnosis not present

## 2019-04-13 DIAGNOSIS — R21 Rash and other nonspecific skin eruption: Secondary | ICD-10-CM | POA: Diagnosis not present

## 2019-04-22 ENCOUNTER — Telehealth: Payer: Self-pay

## 2019-04-22 NOTE — Telephone Encounter (Signed)
Cardiac Ct Received: Today Message Contents  Adonis Hugu[facility] Triage        Good morning I spoke with Ms Mihalic this morning and she wishes to hold off on having cardiac ct done , until after she has all her other testing done.    Thank you  Jannet Askew

## 2019-04-29 ENCOUNTER — Encounter: Payer: Self-pay | Admitting: Specialist

## 2019-04-29 ENCOUNTER — Other Ambulatory Visit: Payer: Self-pay

## 2019-04-29 ENCOUNTER — Ambulatory Visit (INDEPENDENT_AMBULATORY_CARE_PROVIDER_SITE_OTHER): Payer: Medicare HMO

## 2019-04-29 ENCOUNTER — Ambulatory Visit (INDEPENDENT_AMBULATORY_CARE_PROVIDER_SITE_OTHER): Payer: Medicare HMO | Admitting: Specialist

## 2019-04-29 VITALS — BP 148/78 | HR 90 | Ht 65.0 in | Wt 183.0 lb

## 2019-04-29 DIAGNOSIS — M4325 Fusion of spine, thoracolumbar region: Secondary | ICD-10-CM | POA: Diagnosis not present

## 2019-04-29 MED ORDER — METHOCARBAMOL 500 MG PO TABS: 500.0000 mg | ORAL_TABLET | Freq: Three times a day (TID) | ORAL | 3 refills | Status: DC | PRN

## 2019-04-29 NOTE — Patient Instructions (Signed)
Avoid frequent bending and stooping  No lifting greater than 10 lbs. May use ice or moist heat for pain. Weight loss is of benefit. No arthritis meds due to diabetes and risk of renal disease.  Exercise is important to improve your indurance and does allow people to function better inspite of back pain.

## 2019-04-29 NOTE — Progress Notes (Signed)
Office Visit Note   Patient: [name]           Date of Birth: February 03, 1951           MRN: [id number] Visit Date: 04/29/2019              Requested by: Iona Beard, [address] [place],  [place] [address] PCP: Iona Beard, MD   Assessment & Plan: Visit Diagnoses:  1. Fusion of spine of thoracolumbar region     Plan: Avoid frequent bending and stooping  No lifting greater than 10 lbs. May use ice or moist heat for pain. Weight loss is of benefit. No arthritis meds due to diabetes and risk of renal disease.  Exercise is important to improve your indurance and does allow people to function better inspite of back pain.  Follow-Up Instructions: Return in about 1 year (around 04/28/2020).   Orders:  Orders Placed This Encounter  Procedures  . XR Lumbar Spine 2-3 Views   No orders of the defined types were placed in this encounter.     Procedures: No procedures performed   Clinical Data: No additional findings.   Subjective: Chief Complaint  Patient presents with  . Lower Back - Follow-up    68 year old female with history of lower back pain previous lumbar fusion L1-S1 with most recent surgery 02/2018.  No bowel of bladder difficulty. No pain with bending or stooping. She is having right leg pain in the right inner ankle and into the toes with cramping in the right thigh. At night there is right thigh cramping and pain like a charlie horse.  When she tries to walk not that much cramping. Bringing her leg in and out increase the pain and with lying on the right side. She uses a pillow under her left foot. No pain with cough or sneeze. She is able to walk up stairs.    Review of Systems  Constitutional: Positive for unexpected weight change.  HENT: Negative.   Eyes: Negative.   Respiratory: Negative.  Negative for apnea, cough, choking, chest tightness, shortness of breath, wheezing and stridor.   Cardiovascular: Positive for chest pain. Negative  for palpitations and leg swelling.  Gastrointestinal: Positive for abdominal distention, abdominal pain and constipation. Negative for anal bleeding, blood in stool, diarrhea, nausea, rectal pain and vomiting.  Endocrine: Negative for cold intolerance, heat intolerance, polydipsia, polyphagia and polyuria.  Genitourinary: Negative.  Negative for decreased urine volume, difficulty urinating, dyspareunia, dysuria, enuresis, flank pain, frequency, genital sores, hematuria, menstrual problem, pelvic pain, urgency and vaginal bleeding.  Musculoskeletal: Positive for back pain. Negative for arthralgias, gait problem, joint swelling, myalgias, neck pain and neck stiffness.  Skin: Negative.   Allergic/Immunologic: Negative.   Neurological: Positive for weakness and numbness. Negative for dizziness, tremors, seizures, syncope, facial asymmetry, speech difficulty, light-headedness and headaches.  Hematological: Negative for adenopathy. Does not bruise/bleed easily.  Psychiatric/Behavioral: Negative for agitation, behavioral problems, confusion, decreased concentration, dysphoric mood, hallucinations, self-injury, sleep disturbance and suicidal ideas. The patient is not nervous/anxious and is not hyperactive.      Objective: Vital Signs: BP (!) 148/78 (BP Location: Left Arm, Patient Position: Sitting)   Pulse 90   Ht 5\' 5"  (1.651 m)   Wt 183 lb (83 kg)   BMI 30.45 kg/m   Physical Exam Constitutional:      Appearance: She is well-developed.  HENT:     Head: Normocephalic and atraumatic.  Eyes:  Pupils: Pupils are equal, round, and reactive to light.  Neck:     Musculoskeletal: Normal range of motion and neck supple.  Pulmonary:     Effort: Pulmonary effort is normal.     Breath sounds: Normal breath sounds.  Abdominal:     General: Bowel sounds are normal.     Palpations: Abdomen is soft.  Skin:    General: Skin is warm and dry.  Neurological:     Mental Status: She is alert and  oriented to person, place, and time.  Psychiatric:        Behavior: Behavior normal.        Thought Content: Thought content normal.        Judgment: Judgment normal.     Back Exam   Tenderness  The patient is experiencing tenderness in the lumbar.  Range of Motion  Extension: abnormal  Flexion: abnormal  Lateral bend right: normal  Lateral bend left: normal  Rotation right: normal  Rotation left: normal   Muscle Strength  Right Quadriceps:  5/5  Left Quadriceps:  5/5  Right Hamstrings:  5/5  Left Hamstrings:  5/5   Tests  Straight leg raise right: negative Straight leg raise left: negative  Other  Toe walk: normal Heel walk: normal Sensation: decreased      Specialty Comments:  No specialty comments available.  Imaging: Xr Lumbar Spine 2-3 Views  Result Date: 04/29/2019 AP and lateral flexion and extension radiographs show hardware T10 to S1 in good position and alignment. No sign of loosening of hardware, fusion appears solid T10 to S1. Mild DJD of hips. Mild sclerosis right SI joint.     PMFS History: Patient Active Problem List   Diagnosis Date Noted  . Acute blood loss as cause of postoperative anemia 03/07/2018    Priority: High    Class: Acute  . Spinal stenosis, lumbar region with neurogenic claudication 03/04/2018    Priority: High    Class: Chronic  . Multilevel degenerative disc disease 03/04/2018    Priority: High    Class: Chronic  . Forestier's disease of thoracolumbar region 03/04/2018    Priority: High    Class: Chronic  . Dysphagia 08/11/2018  . Leg swelling 08/11/2018  . Bilateral carotid bruits 08/11/2018  . Abnormal EKG 08/06/2018  . Fusion of spine of thoracolumbar region 03/04/2018  . Pain in right hip 03/18/2017  . Stiffness of joint, not elsewhere classified, other specified site 08/04/2013  . Leg weakness, bilateral 08/04/2013  . CHEST PAIN UNSPECIFIED 05/10/2009  . BENIGN POSITIONAL VERTIGO 07/30/2007  . VARICOSE  VEINS, LOWER EXTREMITIES 07/30/2007  . SCIATICA, CHRONIC 07/30/2007  . DIABETES MELLITUS, TYPE II 11/18/2006  . Hyperlipidemia 11/18/2006  . MIGRAINE HEADACHE 11/18/2006  . LESION, SCIATIC NERVE 11/18/2006  . Essential hypertension 11/18/2006  . HYPOTENSION, ORTHOSTATIC 11/18/2006  . BRONCHITIS NOS 11/18/2006  . Other emphysema (HCC) 11/18/2006  . ASTHMA 11/18/2006  . OSTEOARTHRITIS 11/18/2006  . LOW BACK PAIN 11/18/2006  . VERTIGO 11/18/2006   Past Medical History:  Diagnosis Date  . Asthma   . Bronchitis   . Diabetes mellitus   . Hyperlipidemia   . Hypertension   . Low back pain   . Migraine   . Osteoarthritis   . Sciatica   . Varicose veins   . Vertigo     Family History  Problem Relation Age of Onset  . Lung cancer Mother   . Stroke Father   . Heart failure Sister   . Heart  failure Sister   . Colon cancer Neg Hx   . Gastric cancer Neg Hx   . Esophageal cancer Neg Hx     Past Surgical History:  Procedure Laterality Date  . ABDOMINAL HYSTERECTOMY    . CARPAL TUNNEL RELEASE    . CESAREAN SECTION    . INGUINAL HERNIA REPAIR    . LUMBAR FUSION    . POSTERIOR LUMBAR FUSION 4 LEVEL N/A 03/04/2018   Procedure: Right transforaminal lumbar interbody fusion L1-2, L2-3 Posterior fusion T10, T11, T12, L1, L2 with T 11, T12 pedicle screws, superior sublaminar hooks T10, local bone graft, allograft cancellous chips Vivigen;  Surgeon: Kerrin ChampagneNitka, Jadynn Epping E, MD;  Location: [facility] OR;  Service: Orthopedics;  Laterality: N/A;   Social History   Occupational History  . Occupation: Disabled    Employer: UNEMPLOYED  Tobacco Use  . Smoking status: Never Smoker  . Smokeless tobacco: Never Used  Substance and Sexual Activity  . Alcohol use: No    Alcohol/week: 0.0 standard drinks  . Drug use: No  . Sexual activity: Not on file

## 2019-05-05 NOTE — Patient Instructions (Addendum)
Truddie HiddenMargaret L Kreis  05/05/2019     @PREFPERIOPPHARMACY @   Your procedure is scheduled on 05/12/2019.  [facility] to [facility] at 9:30 A.M.  Call this number if you have problems the morning of surgery:  [phone]   Remember:  Do not eat or drink after midnight. Please follow the instructions given to you for the prep by Dr. Darrick PennaFields office.   Take these medicines the morning of surgery with A SIP OF WATER Norvasc, Losartan, Robaxin, Lopressor.  Please take 1/2 of your insulin dose the night before the procedure and no diabetic meds the morning of the procedure.    Do not wear jewelry, make-up or nail polish.  Do not wear lotions, powders, or perfumes, or deodorant.  Do not shave 48 hours prior to surgery.  Men may shave face and neck.  Do not bring valuables to the hospital.  [facility] not [facility] for any belongings or valuables.  Contacts, dentures or bridgework may not be worn into surgery.  Leave your suitcase in the car.  After surgery it may be brought to your room.  For patients admitted to the hospital, discharge time will be determined by your treatment team.  Patients discharged the day of surgery will not be allowed to drive home.   Name and phone number of your driver:   family Special instructions:  n/a  Please read over the following fact sheets that you were given. Care and Recovery After Surgery   PATIENT INSTRUCTIONS POST-ANESTHESIA  IMMEDIATELY FOLLOWING SURGERY:  Do not drive or operate machinery for the first twenty four hours after surgery.  Do not make any important decisions for twenty four hours after surgery or while taking narcotic pain medications or sedatives.  If you develop intractable nausea and vomiting or a severe headache please notify your doctor immediately.  FOLLOW-UP:  Please make an appointment with your surgeon as instructed. You do not need to follow up with anesthesia unless specifically instructed to do so.  WOUND CARE  INSTRUCTIONS (if applicable):  Keep a dry clean dressing on the anesthesia/puncture wound site if there is drainage.  Once the wound has quit draining you may leave it open to air.  Generally you should leave the bandage intact for twenty four hours unless there is drainage.  If the epidural site drains for more than 36-48 hours please call the anesthesia department.  QUESTIONS?:  Please feel free to call your physician or the hospital operator if you have any questions, and they will be happy to assist you.       Upper Endoscopy, Adult Upper endoscopy is a procedure to look inside the upper GI (gastrointestinal) tract. The upper GI tract is made up of:  The part of the body that moves food from your mouth to your stomach (esophagus).  The stomach.  The first part of your small intestine (duodenum). This procedure is also called esophagogastroduodenoscopy (EGD) or gastroscopy. In this procedure, your health care provider passes a thin, flexible tube (endoscope) through your mouth and down your esophagus into your stomach. A small camera is attached to the end of the tube. Images from the camera appear on a monitor in the exam room. During this procedure, your health care provider may also remove a small piece of tissue to be sent to a lab and examined under a microscope (biopsy). Your health care provider may do an upper endoscopy to diagnose cancers of the upper GI tract. You may also have this  procedure to find the cause of other conditions, such as:  Stomach pain.  Heartburn.  Pain or problems when swallowing.  Nausea and vomiting.  Stomach bleeding.  Stomach ulcers. Tell a health care provider about:  Any allergies you have.  All medicines you are taking, including vitamins, herbs, eye drops, creams, and over-the-counter medicines.  Any problems you or family members have had with anesthetic medicines.  Any blood disorders you have.  Any surgeries you have had.  Any  medical conditions you have.  Whether you are pregnant or may be pregnant. What are the risks? Generally, this is a safe procedure. However, problems may occur, including:  Infection.  Bleeding.  Allergic reactions to medicines.  A tear or hole (perforation) in the esophagus, stomach, or duodenum. What happens before the procedure? Staying hydrated Follow instructions from your health care provider about hydration, which may include:  Up to 2 hours before the procedure - you may continue to drink clear liquids, such as water, clear fruit juice, black coffee, and plain tea.  Eating and drinking restrictions Follow instructions from your health care provider about eating and drinking, which may include:  8 hours before the procedure - stop eating heavy meals or foods, such as meat, fried foods, or fatty foods.  6 hours before the procedure - stop eating light meals or foods, such as toast or cereal.  6 hours before the procedure - stop drinking milk or drinks that contain milk.  2 hours before the procedure - stop drinking clear liquids. Medicines Ask your health care provider about:  Changing or stopping your regular medicines. This is especially important if you are taking diabetes medicines or blood thinners.  Taking medicines such as aspirin and ibuprofen. These medicines can thin your blood. Do not take these medicines unless your health care provider tells you to take them.  Taking over-the-counter medicines, vitamins, herbs, and supplements. General instructions  Plan to have someone take you home from the hospital or clinic.  If you will be going home right after the procedure, plan to have someone with you for 24 hours.  Ask your health care provider what steps will be taken to help prevent infection. What happens during the procedure?   An IV will be inserted into one of your veins.  You may be given one or more of the following: ? A medicine to help you  relax (sedative). ? A medicine to numb the throat (local anesthetic).  You will lie on your left side on an exam table.  Your health care provider will pass the endoscope through your mouth and down your esophagus.  Your health care provider will use the scope to check the inside of your esophagus, stomach, and duodenum. Biopsies may be taken.  The endoscope will be removed. The procedure may vary among health care providers and hospitals. What happens after the procedure?  Your blood pressure, heart rate, breathing rate, and blood oxygen level will be monitored until you leave the hospital or clinic.  Do not drive for 24 hours if you were given a sedative during your procedure.  When your throat is no longer numb, you may be given some fluids to drink.  It is up to you to get the results of your procedure. Ask your health care provider, or the department that is doing the procedure, when your results will be ready. Summary  Upper endoscopy is a procedure to look inside the upper GI tract.  During the procedure, an  IV will be inserted into one of your veins. You may be given a medicine to help you relax.  A medicine will be used to numb your throat.  The endoscope will be passed through your mouth and down your esophagus. This information is not intended to replace advice given to you by your health care provider. Make sure you discuss any questions you have with your health care provider. Document Released: 09/28/2000 Document Revised: 03/26/2018 Document Reviewed: 03/03/2018 Elsevier Patient Education  2020 [employer]

## 2019-05-06 ENCOUNTER — Encounter (HOSPITAL_COMMUNITY)
Admission: RE | Admit: 2019-05-06 | Discharge: 2019-05-06 | Disposition: A | Discharge status: Home / Self Care | Payer: Medicare HMO | Source: Ambulatory Visit | Attending: Gastroenterology | Admitting: Gastroenterology

## 2019-05-06 ENCOUNTER — Other Ambulatory Visit: Payer: Self-pay

## 2019-05-06 ENCOUNTER — Encounter (HOSPITAL_COMMUNITY): Payer: Self-pay

## 2019-05-06 DIAGNOSIS — Z01812 Encounter for preprocedural laboratory examination: Secondary | ICD-10-CM | POA: Insufficient documentation

## 2019-05-06 DIAGNOSIS — Z1159 Encounter for screening for other viral diseases: Secondary | ICD-10-CM | POA: Insufficient documentation

## 2019-05-06 LAB — BASIC METABOLIC PANEL
Anion gap: 12 (ref 5–15)
BUN: 16 mg/dL (ref 8–23)
CO2: 27 mmol/L (ref 22–32)
Calcium: 9.8 mg/dL (ref 8.9–10.3)
Chloride: 98 mmol/L (ref 98–111)
Creatinine, Ser: 0.79 mg/dL (ref 0.44–1.00)
GFR calc Af Amer: >60 mL/min (ref >60)
GFR calc non Af Amer: >60 mL/min (ref >60)
Glucose, Bld: 266 mg/dL — ABNORMAL HIGH (ref 70–99)
Potassium: 4.2 mmol/L (ref 3.5–5.1)
Sodium: 137 mmol/L (ref 135–145)

## 2019-05-08 ENCOUNTER — Other Ambulatory Visit: Payer: Self-pay

## 2019-05-08 ENCOUNTER — Other Ambulatory Visit (HOSPITAL_COMMUNITY)
Admission: RE | Admit: 2019-05-08 | Discharge: 2019-05-08 | Disposition: A | Discharge status: Home / Self Care | Payer: Medicare HMO | Source: Ambulatory Visit | Attending: Gastroenterology | Admitting: Gastroenterology

## 2019-05-08 DIAGNOSIS — Z01812 Encounter for preprocedural laboratory examination: Secondary | ICD-10-CM | POA: Diagnosis not present

## 2019-05-08 DIAGNOSIS — Z1159 Encounter for screening for other viral diseases: Secondary | ICD-10-CM | POA: Diagnosis not present

## 2019-05-09 LAB — SARS CORONAVIRUS 2 (TAT 6-24 HRS): SARS Coronavirus 2: NEGATIVE

## 2019-05-11 NOTE — Progress Notes (Signed)
CC'D TO PCP °

## 2019-05-12 ENCOUNTER — Encounter (HOSPITAL_COMMUNITY): Payer: Self-pay | Admitting: *Deleted

## 2019-05-12 ENCOUNTER — Encounter (HOSPITAL_COMMUNITY)
Admission: RE | Disposition: A | Discharge status: Home / Self Care | Payer: Self-pay | Source: Home / Self Care | Attending: Gastroenterology

## 2019-05-12 ENCOUNTER — Ambulatory Visit (HOSPITAL_COMMUNITY): Payer: Medicare HMO | Admitting: Anesthesiology

## 2019-05-12 ENCOUNTER — Ambulatory Visit (HOSPITAL_COMMUNITY)
Admission: RE | Admit: 2019-05-12 | Discharge: 2019-05-12 | Disposition: A | Discharge status: Home / Self Care | Payer: Medicare HMO | Attending: Gastroenterology | Admitting: Gastroenterology

## 2019-05-12 DIAGNOSIS — K2950 Unspecified chronic gastritis without bleeding: Secondary | ICD-10-CM | POA: Diagnosis not present

## 2019-05-12 DIAGNOSIS — Z794 Long term (current) use of insulin: Secondary | ICD-10-CM | POA: Insufficient documentation

## 2019-05-12 DIAGNOSIS — J449 Chronic obstructive pulmonary disease, unspecified: Secondary | ICD-10-CM | POA: Insufficient documentation

## 2019-05-12 DIAGNOSIS — M1990 Unspecified osteoarthritis, unspecified site: Secondary | ICD-10-CM | POA: Insufficient documentation

## 2019-05-12 DIAGNOSIS — K2970 Gastritis, unspecified, without bleeding: Secondary | ICD-10-CM | POA: Diagnosis not present

## 2019-05-12 DIAGNOSIS — R1310 Dysphagia, unspecified: Secondary | ICD-10-CM | POA: Diagnosis not present

## 2019-05-12 DIAGNOSIS — I10 Essential (primary) hypertension: Secondary | ICD-10-CM | POA: Diagnosis not present

## 2019-05-12 DIAGNOSIS — E785 Hyperlipidemia, unspecified: Secondary | ICD-10-CM | POA: Insufficient documentation

## 2019-05-12 DIAGNOSIS — Z79899 Other long term (current) drug therapy: Secondary | ICD-10-CM | POA: Insufficient documentation

## 2019-05-12 DIAGNOSIS — J45909 Unspecified asthma, uncomplicated: Secondary | ICD-10-CM | POA: Diagnosis not present

## 2019-05-12 DIAGNOSIS — K319 Disease of stomach and duodenum, unspecified: Secondary | ICD-10-CM | POA: Diagnosis not present

## 2019-05-12 DIAGNOSIS — E119 Type 2 diabetes mellitus without complications: Secondary | ICD-10-CM | POA: Diagnosis not present

## 2019-05-12 HISTORY — PX: BIOPSY: SHX5522

## 2019-05-12 HISTORY — PX: SAVORY DILATION: SHX5439

## 2019-05-12 HISTORY — PX: ESOPHAGOGASTRODUODENOSCOPY (EGD) WITH PROPOFOL: SHX5813

## 2019-05-12 LAB — GLUCOSE, CAPILLARY: Glucose-Capillary: 190 mg/dL — ABNORMAL HIGH (ref 70–99)

## 2019-05-12 SURGERY — ESOPHAGOGASTRODUODENOSCOPY (EGD) WITH PROPOFOL
Anesthesia: General

## 2019-05-12 MED ORDER — PROPOFOL 10 MG/ML IV BOLUS
INTRAVENOUS | Status: DC | PRN
  Administered 2019-05-12: 15 mg via INTRAVENOUS

## 2019-05-12 MED ORDER — LACTATED RINGERS IV SOLN: INTRAVENOUS | Status: DC

## 2019-05-12 MED ORDER — MIDAZOLAM HCL 5 MG/5ML IJ SOLN
INTRAMUSCULAR | Status: DC | PRN
  Administered 2019-05-12: 2 mg via INTRAVENOUS

## 2019-05-12 MED ORDER — CHLORHEXIDINE GLUCONATE CLOTH 2 % EX PADS: 6.0000 | MEDICATED_PAD | Freq: Once | CUTANEOUS | Status: DC

## 2019-05-12 MED ORDER — PROPOFOL 500 MG/50ML IV EMUL
INTRAVENOUS | Status: DC | PRN
  Administered 2019-05-12: 125 ug/kg/min via INTRAVENOUS

## 2019-05-12 MED ORDER — MIDAZOLAM HCL 2 MG/2ML IJ SOLN
INTRAMUSCULAR | Status: AC
  Filled 2019-05-12: qty 2

## 2019-05-12 MED ORDER — KETAMINE HCL 50 MG/5ML IJ SOSY
PREFILLED_SYRINGE | INTRAMUSCULAR | Status: AC
  Filled 2019-05-12: qty 5

## 2019-05-12 MED ORDER — LIDOCAINE VISCOUS HCL 2 % MT SOLN: 5.0000 mL | Freq: Once | OROMUCOSAL | Status: DC

## 2019-05-12 MED ORDER — LACTATED RINGERS IV SOLN
INTRAVENOUS | Status: DC | PRN
  Administered 2019-05-12: 10:00:00 via INTRAVENOUS

## 2019-05-12 MED ORDER — KETAMINE HCL 10 MG/ML IJ SOLN
INTRAMUSCULAR | Status: DC | PRN
  Administered 2019-05-12 (×2): 10 mg via INTRAVENOUS

## 2019-05-12 MED ORDER — MIDAZOLAM HCL 2 MG/2ML IJ SOLN: 0.5000 mg | Freq: Once | INTRAMUSCULAR | Status: DC | PRN

## 2019-05-12 MED ORDER — HYDROMORPHONE HCL 1 MG/ML IJ SOLN: 0.2500 mg | INTRAMUSCULAR | Status: DC | PRN

## 2019-05-12 MED ORDER — PROMETHAZINE HCL 25 MG/ML IJ SOLN: 6.2500 mg | INTRAMUSCULAR | Status: DC | PRN

## 2019-05-12 MED ORDER — HYDROCODONE-ACETAMINOPHEN 7.5-325 MG PO TABS: 1.0000 | ORAL_TABLET | Freq: Once | ORAL | Status: DC | PRN

## 2019-05-12 NOTE — Op Note (Signed)
[facility] Patient Name: [name] Procedure Date: 05/12/2019 10:48 AM MRN: [id number] Date of Birth: 01-13-1951 Attending MD: Jonette EvaSandi Kyanna Mahrt MD, MD CSN: [id number] Age: 68 Admit Type: Outpatient Procedure:                Upper GI endoscopy WITH COLD FORCEPS                            BIOPSY/ESOPHAGEAL DILATION Indications:              Dysphagia Providers:                Jonette EvaSandi Niema Carrara MD, MD, Jannett CelestineAnitra Bell, RN, Burke Keelsrisann                            Tilley, Technician Referring MD:             Annia FriendlyGerald K. Hill MD, MD Medicines:                Propofol per Anesthesia Complications:            No immediate complications. Estimated Blood Loss:     Estimated blood loss was minimal. Procedure:                Pre-Anesthesia Assessment:                           - Prior to the procedure, a History and Physical                            was performed, and patient medications and                            allergies were reviewed. The patient's tolerance of                            previous anesthesia was also reviewed. The risks                            and benefits of the procedure and the sedation                            options and risks were discussed with the patient.                            All questions were answered, and informed consent                            was obtained. Prior Anticoagulants: The patient has                            taken no previous anticoagulant or antiplatelet                            agents except for aspirin. ASA Grade Assessment: II                            -  A patient with mild systemic disease. After                            reviewing the risks and benefits, the patient was                            deemed in satisfactory condition to undergo the                            procedure. After obtaining informed consent, the                            endoscope was passed under direct vision.                            Throughout  the procedure, the patient's blood                            pressure, pulse, and oxygen saturations were                            monitored continuously. The GIF-H190 ([id number]) was                            introduced through the mouth, and advanced to the                            second part of duodenum. The upper GI endoscopy was                            accomplished without difficulty. The patient                            tolerated the procedure well. Scope In: 11:19:22 AM Scope Out: 11:26:22 AM Total Procedure Duration: 0 hours 7 minutes 0 seconds  Findings:      No endoscopic abnormality was evident in the esophagus to explain the       patient's complaint of dysphagia. A guidewire was placed and the scope       was withdrawn. Dilation was performed with a Savary dilator with mild       resistance at 15 mm, 16 mm and 17 mm DUE TO POSSIBLE PROXIMAL ESOPHAGEAL       WEB. NO HEME ON DILATOR AFTERWARDS.      Diffuse moderate inflammation characterized by congestion (edema) and       erythema was found on the greater curvature of the stomach and in the       gastric antrum. Biopsies(2: BODY, 1: INCISURA, 2: antrum) were taken       with a cold forceps for Helicobacter pylori testing.      The examined duodenum was normal. Impression:               - No endoscopic esophageal abnormality to explain                            patient's dysphagia.  Dilated.                           - MODERATE Gastritis DUE TO ASA. Biopsied. Moderate Sedation:      Per Anesthesia Care Recommendation:           - Patient has a contact number available for                            emergencies. The signs and symptoms of potential                            delayed complications were discussed with the                            patient. Return to normal activities tomorrow.                            Written discharge instructions were provided to the                            patient.                            - High fiber diet.                           - Continue present medications.                           - Await pathology results.                           - Return to GI clinic in 4 months. Procedure Code(s):        --- Professional ---                           [phone], Esophagogastroduodenoscopy, flexible,                            transoral; with insertion of guide wire followed by                            passage of dilator(s) through esophagus over guide                            wire                           43239, 59, Esophagogastroduodenoscopy, flexible,                            transoral; with biopsy, single or multiple Diagnosis Code(s):        --- Professional ---                           R13.10, Dysphagia, unspecified  K29.70, Gastritis, unspecified, without bleeding CPT copyright 2019 American Medical Association. All rights reserved. The codes documented in this report are preliminary and upon coder review may  be revised to meet current compliance requirements. Barney Drain, MD Barney Drain MD, MD 05/12/2019 11:42:51 AM This report has been signed electronically. Number of Addenda: 0

## 2019-05-12 NOTE — H&P (Signed)
Primary Care Physician:  Iona Beard, MD Primary Gastroenterologist:  Dr. Oneida Alar  Pre-Procedure History & Physical: HPI:  [name] is a 68 y.o. adult here for DYSPHAGIA.  Past Medical History:  Diagnosis Date  . Asthma   . Bronchitis   . Diabetes mellitus   . Hyperlipidemia   . Hypertension   . Low back pain   . Migraine   . Osteoarthritis   . Sciatica   . Varicose veins   . Vertigo     Past Surgical History:  Procedure Laterality Date  . ABDOMINAL HYSTERECTOMY    . CARPAL TUNNEL RELEASE Bilateral   . CESAREAN SECTION    . INGUINAL HERNIA REPAIR Left   . LUMBAR FUSION    . POSTERIOR LUMBAR FUSION 4 LEVEL N/A 03/04/2018   Procedure: Right transforaminal lumbar interbody fusion L1-2, L2-3 Posterior fusion T10, T11, T12, L1, L2 with T 11, T12 pedicle screws, superior sublaminar hooks T10, local bone graft, allograft cancellous chips Vivigen;  Surgeon: Jessy Oto, MD;  Location: [facility];  Service: Orthopedics;  Laterality: N/A;    Prior to Admission medications   Medication Sig Start Date End Date Taking? Authorizing Provider  amLODipine (NORVASC) 10 MG tablet Take 10 mg by mouth daily.    Yes [provider]  aspirin 81 MG chewable tablet Chew 81 mg by mouth daily.   Yes [provider]  insulin NPH-insulin regular (NOVOLIN 70/30) (70-30) 100 UNIT/ML injection Inject 67 Units into the skin 2 (two) times daily.    Yes [provider]  losartan (COZAAR) 50 MG tablet Take 50 mg by mouth daily. 04/23/18  Yes [provider]  metFORMIN (GLUCOPHAGE) 1000 MG tablet Take 1,000 mg by mouth at bedtime.   Yes [provider]  methocarbamol (ROBAXIN) 500 MG tablet Take 1 tablet (500 mg total) by mouth every 8 (eight) hours as needed for muscle spasms. 04/29/19  Yes Jessy Oto, MD  metoprolol tartrate (LOPRESSOR) 100 MG tablet Take 100 mg 2 hours before cardiac CT 04/10/19  Yes Herminio Commons, MD  nitroGLYCERIN (NITROSTAT)  0.4 MG SL tablet Place 0.4 mg under the tongue every 5 (five) minutes as needed for chest pain. Dissolve one tablet under the tongue every 5 mintues as needed for chest pain.   Yes [provider]  pantoprazole (PROTONIX) 40 MG tablet Take 40 mg by mouth daily.   Yes [provider]  simvastatin (ZOCOR) 20 MG tablet Take 20 mg by mouth at bedtime.     Yes [provider]  TRADJENTA 5 MG TABS tablet TAKE 1 TABLET BY MOUTH ONCE DAILY WITH BREAKFAST 02/24/19  Yes [provider]  ONETOUCH VERIO test strip See admin instructions. 03/25/18   [provider]  [place] 1ML/31G 31G X 5/16" 1 ML MISC USE 1 TWICE DAILY AS DIRECTED 04/16/18   [provider]    Allergies as of 11/12/2018  . (No Known Allergies)    Family History  Problem Relation Age of Onset  . Lung cancer Mother   . Stroke Father   . Heart failure Sister   . Heart failure Sister   . Colon cancer Neg Hx   . Gastric cancer Neg Hx   . Esophageal cancer Neg Hx     Social History   Socioeconomic History  . Marital status: Single    Spouse name: Not on file  . Number of children: Not on file  . Years of education: 12th   .  Highest education level: Not on file  Occupational History  . Occupation: Disabled    Associate Professormployer: UNEMPLOYED  Social Needs  . Financial resource strain: Not on file  . Food insecurity    Worry: Not on file    Inability: Not on file  . Transportation needs    Medical: Not on file    Non-medical: Not on file  Tobacco Use  . Smoking status: Never Smoker  . Smokeless tobacco: Never Used  Substance and Sexual Activity  . Alcohol use: No    Alcohol/week: 0.0 standard drinks  . Drug use: No  . Sexual activity: Yes    Birth control/protection: Surgical  Lifestyle  . Physical activity    Days per week: Not on file    Minutes per session: Not on file  . Stress: Not on file  Relationships  . Social Musicianconnections    Talks on phone: Not on  file    Gets together: Not on file    Attends religious service: Not on file    Active member of club or organization: Not on file    Attends meetings of clubs or organizations: Not on file    Relationship status: Not on file  . Intimate partner violence    Fear of current or ex partner: Not on file    Emotionally abused: Not on file    Physically abused: Not on file    Forced sexual activity: Not on file  Other Topics Concern  . Not on file  Social History Narrative   No caffeine use     Review of Systems: See HPI, otherwise negative ROS   Physical Exam: BP (!) 167/80   Pulse 80   Temp 97.6 F (36.4 C) (Oral)   Resp 14   Ht 5\' 5"  (1.651 m)   Wt 82.5 kg   BMI 30.27 kg/m  General:   Alert,  pleasant and cooperative in NAD Head:  Normocephalic and atraumatic. Neck:  Supple; Lungs:  Clear throughout to auscultation.    Heart:  Regular rate and rhythm. Abdomen:  Soft, nontender and nondistended. Normal bowel sounds, without guarding, and without rebound.   Neurologic:  Alert and  oriented x4;  grossly normal neurologically.  Impression/Plan:     DYSPHAGIA  PLAN:  EGD/DIL TODAY.  DISCUSSED PROCEDURE, BENEFITS, & RISKS: < 1% chance of medication reaction, bleeding, perforation, or ASPIRATION.

## 2019-05-12 NOTE — Discharge Instructions (Signed)
I dilated your esophagus. I DID NOT SEE A DEFINITE NARROWING IN YOUR esophagus. You have moderate gastritis most likely due to aspirin. I biopsied your stomach.   DRINK WATER TO KEEP YOUR URINE LIGHT YELLOW.  CONTINUE YOUR WEIGHT LOSS EFFORTS. YOUR BODY MASS INDEX IS OVER 30, WHICH MEANS YOU ARE OBESE. OBESITY IS ASSOCIATED WITH AN INCREASED FOR CIRRHOSIS AND ALL CANCERS, INCLUDING ESOPHAGEAL AND COLON CANCER. I RECOMMEND YOU READ AND FOLLOW RECOMMENDATIONS of DR. MARK HYMAN, "10-DAY DETOX DIET".  OTHERWISE, FOLLOW A LOW FAT DIET. MEATS SHOULD BE BAKED, BROILED, OR BOILED. Avoid fried foods. DO NOT EAT FAST FOOD.   CONTINUE PROTONIX. TAKE 30 MINUTES PRIOR TO BREAKFAST DAILY.    USE PREPARATION H FOUR TIMES  A DAY IF NEEDED TO RELIEVE RECTAL PAIN/PRESSURE/BLEEDING.   PLEASE CALL IN ONE MONTH IF YOUR STILL HAVING PROBLEMS SWALLOWING.  FOLLOW UP IN 4 MOS E30 DYSPHAGIA/DYSPEPSIA WITH DR. Kyana Aicher.  UPPER ENDOSCOPY AFTER CARE Read the instructions outlined below and refer to this sheet in the next week. These discharge instructions provide you with general information on caring for yourself after you leave the hospital. While your treatment has been planned according to the most current medical practices available, unavoidable complications occasionally occur. If you have any problems or questions after discharge, call DR. Gabrelle Roca, [phone].  ACTIVITY  You may resume your regular activity, but move at a slower pace for the next 24 hours.   Take frequent rest periods for the next 24 hours.   Walking will help get rid of the air and reduce the bloated feeling in your belly (abdomen).   No driving for 24 hours (because of the medicine (anesthesia) used during the test).   You may shower.   Do not sign any important legal documents or operate any machinery for 24 hours (because of the anesthesia used during the test).    NUTRITION  Drink plenty of fluids.   You may resume your  normal diet as instructed by your doctor.   Begin with a light meal and progress to your normal diet. Heavy or fried foods are harder to digest and may make you feel sick to your stomach (nauseated).   Avoid alcoholic beverages for 24 hours or as instructed.    MEDICATIONS  You may resume your normal medications.   WHAT YOU CAN EXPECT TODAY  Some feelings of bloating in the abdomen.   Passage of more gas than usual.    IF YOU HAD A BIOPSY TAKEN DURING THE UPPER ENDOSCOPY:  Eat a soft diet IF YOU HAVE NAUSEA, BLOATING, ABDOMINAL PAIN, OR VOMITING.    FINDING OUT THE RESULTS OF YOUR TEST Not all test results are available during your visit. DR. Darrick PennaFIELDS WILL CALL YOU WITHIN 14 DAYS OF YOUR PROCEDUE WITH YOUR RESULTS. Do not assume everything is normal if you have not heard from DR. Tryphena Perkovich, CALL HER OFFICE AT [phone].  SEEK IMMEDIATE MEDICAL ATTENTION AND CALL THE OFFICE: [phone] IF:  You have more than a spotting of blood in your stool.   Your belly is swollen (abdominal distention).   You are nauseated or vomiting.   You have a temperature over 101F.   You have abdominal pain or discomfort that is severe or gets worse throughout the day.  DYSPHAGIA DYSPHAGIA can be caused by stomach acid backing up into the tube that carries food from the mouth down to the stomach (lower esophagus).  TREATMENT There are a number of medicines used to treat DYSPHAGIA  including: Antacids.  Proton-pump inhibitors: PROTONIX ZANTAC/PEPCID    Lifestyle and home remedies to control REFLUX and regurgitation  You may eliminate or reduce the frequency of heartburn by making the following lifestyle changes:   Control your weight. Being overweight is a major risk factor for heartburn and GERD. Excess pounds put pressure on your abdomen, pushing up your stomach and causing acid to back up into your esophagus.    Eat smaller meals. 4 TO 6 MEALS A DAY. This reduces pressure on the  lower esophageal sphincter, helping to prevent the valve from opening and acid from washing back into your esophagus.    Loosen your belt. Clothes that fit tightly around your waist put pressure on your abdomen and the lower esophageal sphincter.     Eliminate heartburn triggers. Everyone has specific triggers. Common triggers such as fatty or fried foods, spicy food, tomato sauce, carbonated beverages, alcohol, chocolate, mint, garlic, onion, caffeine and nicotine may make heartburn worse.    Avoid stooping or bending. Tying your shoes is OK. Bending over for longer periods to weed your garden isn't, especially soon after eating.    Don't lie down after a meal. Wait at least three to four hours after eating before going to bed, and don't lie down right after eating.   Alternative medicine  Several home remedies exist for treating GERD, but they provide only temporary relief. They include drinking baking soda (sodium bicarbonate) added to water or drinking other fluids such as baking soda mixed with cream of tartar and water.  Although these liquids create temporary relief by neutralizing, washing away or buffering acids, eventually they aggravate the situation by adding gas and fluid to your stomach, increasing pressure and causing more acid reflux. Further, adding more sodium to your diet may increase your blood pressure and add stress to your heart, and excessive bicarbonate ingestion can alter the acid-base balance in your body.

## 2019-05-12 NOTE — Anesthesia Postprocedure Evaluation (Signed)
Anesthesia Post Note  Patient: [name]  Procedure(s) Performed: ESOPHAGOGASTRODUODENOSCOPY (EGD) WITH PROPOFOL (N/A ) SAVORY DILATION (N/A ) BIOPSY  Patient location during evaluation: PACU Anesthesia Type: General Level of consciousness: awake Pain management: pain level controlled Vital Signs Assessment: post-procedure vital signs reviewed and stable Respiratory status: spontaneous breathing, respiratory function stable and nonlabored ventilation Cardiovascular status: blood pressure returned to baseline Postop Assessment: no apparent nausea or vomiting Anesthetic complications: no     Last Vitals:  Vitals:   05/12/19 0954 05/12/19 1138  BP: (!) 167/80 (P) 108/60  Pulse: 80   Resp: 14   Temp: 36.4 C (P) 36.8 C    Last Pain:  Vitals:   05/12/19 1138  TempSrc:   PainSc: (P) 0-No pain                 Aiman Noe J

## 2019-05-12 NOTE — Transfer of Care (Signed)
Immediate Anesthesia Transfer of Care Note  Patient: [name]  Procedure(s) Performed: ESOPHAGOGASTRODUODENOSCOPY (EGD) WITH PROPOFOL (N/A ) SAVORY DILATION (N/A ) BIOPSY  Patient Location: PACU  Anesthesia Type:General  Level of Consciousness: drowsy and patient cooperative  Airway & Oxygen Therapy: Patient Spontanous Breathing  Post-op Assessment: Report given to RN and Post -op Vital signs reviewed and stable  Post vital signs: Reviewed and stable  Last Vitals:  Vitals Value Taken Time  BP    Temp    Pulse    Resp    SpO2      Last Pain:  Vitals:   05/12/19 0954  TempSrc: Oral  PainSc: 5       Patients Stated Pain Goal: 8 ([id number])  Complications: No apparent anesthesia complications

## 2019-05-12 NOTE — Anesthesia Preprocedure Evaluation (Signed)
Anesthesia Evaluation  Patient identified by MRN, date of birth, ID band Patient awake    Reviewed: Allergy & Precautions, NPO status , Patient's Chart, lab work & pertinent test results, reviewed documented beta blocker date and time   Airway Mallampati: II  TM Distance: >3 FB Neck ROM: Full    Dental no notable dental hx. (+) Edentulous Lower, Edentulous Upper   Pulmonary asthma , COPD,  Denies inhaler use    Pulmonary exam normal breath sounds clear to auscultation       Cardiovascular Exercise Tolerance: Poor hypertension, Pt. on medications and Pt. on home beta blockers negative cardio ROS Normal cardiovascular examII Rhythm:Regular Rate:Normal  S/p Multi level lumbar fusion -limits ET Reports occ NTG use -last ~5 days ago -no ER visit    Neuro/Psych  Headaches,  Neuromuscular disease negative psych ROS   GI/Hepatic Neg liver ROS, GERD  Medicated and Controlled,  Endo/Other  negative endocrine ROSdiabetesBMI>30  Renal/GU negative Renal ROS  negative genitourinary   Musculoskeletal  (+) Arthritis ,   Abdominal   Peds negative pediatric ROS (+)  Hematology negative hematology ROS (+) anemia ,   Anesthesia Other Findings   Reproductive/Obstetrics negative OB ROS                             Anesthesia Physical Anesthesia Plan  ASA: III  Anesthesia Plan: General   Post-op Pain Management:    Induction: Intravenous  PONV Risk Score and Plan: 3 and Treatment may vary due to age or medical condition, Propofol infusion and TIVA  Airway Management Planned: Nasal Cannula and Simple Face Mask  Additional Equipment:   Intra-op Plan:   Post-operative Plan:   Informed Consent: I have reviewed the patients History and Physical, chart, labs and discussed the procedure including the risks, benefits and alternatives for the proposed anesthesia with the patient or authorized  representative who has indicated his/her understanding and acceptance.     Dental advisory given  Plan Discussed with: CRNA  Anesthesia Plan Comments: (Plan Full PPE use  Plan GA with GETA as needed -WTP with same after Q&A)        Anesthesia Quick Evaluation

## 2019-05-20 ENCOUNTER — Encounter (HOSPITAL_COMMUNITY): Payer: Self-pay | Admitting: Gastroenterology

## 2019-05-28 ENCOUNTER — Telehealth: Payer: Self-pay | Admitting: Gastroenterology

## 2019-05-28 NOTE — Telephone Encounter (Signed)
Please call pt. HER stomach Bx shows mild gastritis.    DRINK WATER TO KEEP YOUR URINE LIGHT YELLOW. CONTINUE YOUR WEIGHT LOSS EFFORTS. I RECOMMEND YOU READ AND FOLLOW RECOMMENDATIONS of DR. MARK HYMAN, "10-DAY DETOX DIET".  OTHERWISE, FOLLOW A LOW FAT DIET. MEATS SHOULD BE BAKED, BROILED, OR BOILED. Avoid fried foods. DO NOT EAT FAST FOOD.  CONTINUE PROTONIX. TAKE 30 MINUTES PRIOR TO BREAKFAST DAILY.  USE PREPARATION H FOUR TIMES  A DAY IF NEEDED TO RELIEVE RECTAL PAIN/PRESSURE/BLEEDING.   PLEASE CALL IN SEP 2020 IF YOUR STILL HAVING PROBLEMS SWALLOWING.  FOLLOW UP IN NOV 2020 E30 DYSPHAGIA/DYSPEPSIA WITH DR. Markian Glockner.

## 2019-05-28 NOTE — Telephone Encounter (Signed)
PT is aware.

## 2019-06-01 DIAGNOSIS — R21 Rash and other nonspecific skin eruption: Secondary | ICD-10-CM | POA: Diagnosis not present

## 2019-06-01 DIAGNOSIS — E1169 Type 2 diabetes mellitus with other specified complication: Secondary | ICD-10-CM | POA: Diagnosis not present

## 2019-06-05 ENCOUNTER — Other Ambulatory Visit: Payer: Self-pay

## 2019-06-05 ENCOUNTER — Other Ambulatory Visit (HOSPITAL_COMMUNITY)
Admission: RE | Admit: 2019-06-05 | Discharge: 2019-06-05 | Disposition: A | Discharge status: Home / Self Care | Payer: Medicare HMO | Source: Ambulatory Visit | Attending: Family Medicine | Admitting: Family Medicine

## 2019-06-05 DIAGNOSIS — Z01818 Encounter for other preprocedural examination: Secondary | ICD-10-CM | POA: Insufficient documentation

## 2019-06-05 LAB — BASIC METABOLIC PANEL
Anion gap: 12 (ref 5–15)
BUN: 13 mg/dL (ref 8–23)
CO2: 25 mmol/L (ref 22–32)
Calcium: 9.7 mg/dL (ref 8.9–10.3)
Chloride: 98 mmol/L (ref 98–111)
Creatinine, Ser: 1.17 mg/dL — ABNORMAL HIGH (ref 0.44–1.00)
GFR calc Af Amer: 55 mL/min — ABNORMAL LOW (ref >60)
GFR calc non Af Amer: 48 mL/min — ABNORMAL LOW (ref >60)
Glucose, Bld: 382 mg/dL — ABNORMAL HIGH (ref 70–99)
Potassium: 4.2 mmol/L (ref 3.5–5.1)
Sodium: 135 mmol/L (ref 135–145)

## 2019-06-08 NOTE — Telephone Encounter (Signed)
REMINDER IN EPIC °

## 2019-06-10 ENCOUNTER — Telehealth (HOSPITAL_COMMUNITY): Payer: Self-pay | Admitting: Emergency Medicine

## 2019-06-10 NOTE — Telephone Encounter (Signed)
Reaching out to patient to offer assistance regarding upcoming cardiac imaging study; pt verbalizes understanding of appt date/time, parking situation and where to check in, pre-test NPO status and medications ordered, and verified current allergies; name and call back number provided for further questions should they arise Caden Fukushima RN Navigator Cardiac Imaging [facility] [phone] office [phone] cell 

## 2019-06-11 ENCOUNTER — Ambulatory Visit (HOSPITAL_COMMUNITY)
Admission: RE | Admit: 2019-06-11 | Discharge: 2019-06-11 | Disposition: A | Discharge status: Home / Self Care | Payer: Medicare HMO | Source: Ambulatory Visit | Attending: Cardiovascular Disease | Admitting: Cardiovascular Disease

## 2019-06-11 ENCOUNTER — Other Ambulatory Visit: Payer: Self-pay

## 2019-06-11 DIAGNOSIS — R0602 Shortness of breath: Secondary | ICD-10-CM

## 2019-06-11 DIAGNOSIS — R079 Chest pain, unspecified: Secondary | ICD-10-CM | POA: Diagnosis not present

## 2019-06-11 DIAGNOSIS — Q211 Atrial septal defect: Secondary | ICD-10-CM | POA: Diagnosis not present

## 2019-06-11 MED ORDER — IOHEXOL 350 MG/ML SOLN
80.0000 mL | Freq: Once | INTRAVENOUS | Status: AC | PRN
  Administered 2019-06-11: 80 mL via INTRAVENOUS

## 2019-06-11 MED ORDER — METOPROLOL TARTRATE 5 MG/5ML IV SOLN
5.0000 mg | Freq: Once | INTRAVENOUS | Status: AC
  Administered 2019-06-11: 09:00:00 5 mg via INTRAVENOUS
  Filled 2019-06-11: qty 5

## 2019-06-11 MED ORDER — DILTIAZEM HCL 25 MG/5ML IV SOLN
5.0000 mg | Freq: Once | INTRAVENOUS | Status: AC
  Administered 2019-06-11: 5 mg via INTRAVENOUS
  Filled 2019-06-11: qty 5

## 2019-06-11 MED ORDER — NITROGLYCERIN 0.4 MG SL SUBL
0.8000 mg | SUBLINGUAL_TABLET | Freq: Once | SUBLINGUAL | Status: AC
  Administered 2019-06-11: 10:00:00 0.8 mg via SUBLINGUAL
  Filled 2019-06-11: qty 25

## 2019-06-11 MED ORDER — METOPROLOL TARTRATE 5 MG/5ML IV SOLN
5.0000 mg | Freq: Once | INTRAVENOUS | Status: AC
  Administered 2019-06-11: 10:00:00 5 mg via INTRAVENOUS
  Filled 2019-06-11: qty 5

## 2019-06-11 MED ORDER — NITROGLYCERIN 0.4 MG SL SUBL
SUBLINGUAL_TABLET | SUBLINGUAL | Status: AC
  Filled 2019-06-11: qty 1

## 2019-06-11 MED ORDER — METOPROLOL TARTRATE 5 MG/5ML IV SOLN
INTRAVENOUS | Status: AC
  Administered 2019-06-11: 5 mg via INTRAVENOUS
  Filled 2019-06-11: qty 5

## 2019-06-11 MED ORDER — DILTIAZEM HCL 25 MG/5ML IV SOLN
INTRAVENOUS | Status: AC
  Filled 2019-06-11: qty 5

## 2019-06-11 MED ORDER — METOPROLOL TARTRATE 5 MG/5ML IV SOLN
5.0000 mg | Freq: Once | INTRAVENOUS | Status: DC
  Filled 2019-06-11: qty 5

## 2019-06-15 DIAGNOSIS — L271 Localized skin eruption due to drugs and medicaments taken internally: Secondary | ICD-10-CM | POA: Diagnosis not present

## 2019-06-20 DIAGNOSIS — R69 Illness, unspecified: Secondary | ICD-10-CM | POA: Diagnosis not present

## 2019-06-30 DIAGNOSIS — R69 Illness, unspecified: Secondary | ICD-10-CM | POA: Diagnosis not present

## 2019-07-13 DIAGNOSIS — M21611 Bunion of right foot: Secondary | ICD-10-CM | POA: Diagnosis not present

## 2019-07-13 DIAGNOSIS — E1165 Type 2 diabetes mellitus with hyperglycemia: Secondary | ICD-10-CM | POA: Diagnosis not present

## 2019-07-13 DIAGNOSIS — K469 Unspecified abdominal hernia without obstruction or gangrene: Secondary | ICD-10-CM | POA: Diagnosis not present

## 2019-07-13 DIAGNOSIS — M21612 Bunion of left foot: Secondary | ICD-10-CM | POA: Diagnosis not present

## 2019-07-13 DIAGNOSIS — M21619 Bunion of unspecified foot: Secondary | ICD-10-CM | POA: Diagnosis not present

## 2019-07-13 DIAGNOSIS — R21 Rash and other nonspecific skin eruption: Secondary | ICD-10-CM | POA: Diagnosis not present

## 2019-07-20 DIAGNOSIS — L309 Dermatitis, unspecified: Secondary | ICD-10-CM | POA: Diagnosis not present

## 2019-07-20 DIAGNOSIS — L308 Other specified dermatitis: Secondary | ICD-10-CM | POA: Diagnosis not present

## 2019-08-03 ENCOUNTER — Encounter: Payer: Self-pay | Admitting: Gastroenterology

## 2019-08-03 DIAGNOSIS — L309 Dermatitis, unspecified: Secondary | ICD-10-CM | POA: Diagnosis not present

## 2019-09-03 ENCOUNTER — Encounter: Payer: Self-pay | Admitting: Gastroenterology

## 2019-09-03 ENCOUNTER — Other Ambulatory Visit: Payer: Self-pay | Admitting: *Deleted

## 2019-09-03 ENCOUNTER — Ambulatory Visit: Payer: Medicare HMO | Admitting: Gastroenterology

## 2019-09-03 ENCOUNTER — Other Ambulatory Visit: Payer: Self-pay

## 2019-09-03 DIAGNOSIS — K5900 Constipation, unspecified: Secondary | ICD-10-CM | POA: Insufficient documentation

## 2019-09-03 DIAGNOSIS — K5901 Slow transit constipation: Secondary | ICD-10-CM

## 2019-09-03 DIAGNOSIS — R1310 Dysphagia, unspecified: Secondary | ICD-10-CM

## 2019-09-03 NOTE — Progress Notes (Signed)
Subjective:    Patient ID: [name], adult    DOB: [birth year], 68 y.o.   MRN: [id number] Angela Mires, MD  HPI Had no choking for 2 weeks then it started happening again. Will get choked with solids and liquids. Has a lot of coughing. Happens at least once a week. Episodes: ~10 mins. HAVING TROUBLE WITH CONSTIPATION. TRIED OTC MEDS. BOWELS MOVE ONCE EVERY 3 WEEKS. IF SHE LAYS ON STOMACH HAS STOMACH PAIN MOVES TO RIGHT CHEST(PRESSURE). WANTS TO KNOW IF UMBILICAL HERNIA CAN CAUSE SYMPTOMS. PT DENIES FEVER, CHILLS, HEMATOCHEZIA, HEMATEMESIS, nausea, vomiting, melena, diarrhea, SHORTNESS OF BREATH, CHANGE IN BOWEL IN HABITS, problems with sedation, OR heartburn or indigestion.  Past Medical History:  Diagnosis Date  . Asthma   . Bronchitis   . Diabetes mellitus   . Hyperlipidemia   . Hypertension   . Low back pain   . Migraine   . Osteoarthritis   . Sciatica   . Varicose veins   . Vertigo    Past Surgical History:  Procedure Laterality Date  . ABDOMINAL HYSTERECTOMY    . BIOPSY  05/12/2019   Procedure: BIOPSY;  Surgeon: West Bali, MD;  [facility];  Service: Endoscopy;;  . CARPAL TUNNEL RELEASE Bilateral   . CESAREAN SECTION    . ESOPHAGOGASTRODUODENOSCOPY (EGD) WITH PROPOFOL N/A 05/12/2019   Procedure: ESOPHAGOGASTRODUODENOSCOPY (EGD) WITH PROPOFOL;  Surgeon: West Bali, MD;  [facility];  Service: Endoscopy;  Laterality: N/A;  12:00pm  . INGUINAL HERNIA REPAIR Left   . LUMBAR FUSION    . POSTERIOR LUMBAR FUSION 4 LEVEL N/A 03/04/2018   Procedure: Right transforaminal lumbar interbody fusion L1-2, L2-3 Posterior fusion T10, T11, T12, L1, L2 with T 11, T12 pedicle screws, superior sublaminar hooks T10, local bone graft, allograft cancellous chips Vivigen;  Surgeon: Kerrin Champagne, MD;  Location: [facility];  Service: Orthopedics;  Laterality: N/A;  . SAVORY DILATION N/A 05/12/2019   Procedure: SAVORY DILATION;  Surgeon: West Bali, MD;   [facility];  Service: Endoscopy;  Laterality: N/A;   No Known Allergies  Current Outpatient Medications  Medication Sig    . amLODipine (NORVASC) 10 MG tablet Take 10 mg by mouth daily.     Marland Kitchen aspirin 81 MG chewable tablet Chew 81 mg by mouth daily.    . insulin NPH-insulin regular (NOVOLIN 70/30) (70-30) 100 UNIT/ML injection Inject 67 Units into the skin 2 (two) times daily.     Marland Kitchen losartan (COZAAR) 50 MG tablet Take 50 mg by mouth daily.    . metFORMIN (GLUCOPHAGE) 1000 MG tablet Take 1,000 mg by mouth at bedtime.    . methocarbamol (ROBAXIN) 500 MG tablet Take 1 tablet (500 mg total) by mouth every 8 (eight) hours as needed for muscle spasms.    . metoprolol tartrate (LOPRESSOR) 100 MG tablet Take 100 mg 2 hours before cardiac CT    . nitroGLYCERIN (NITROSTAT) 0.4 MG SL tablet Place 0.4 mg under the tongue every 5 (five) minutes as needed for chest pain. Dissolve one tablet under the tongue every 5 mintues as needed for chest pain.    Letta Pate VERIO test strip See admin instructions.    . pantoprazole (PROTONIX) 40 MG tablet Take 40 mg by mouth daily.    Marland Kitchen RELION INSULIN SYRINGE 1ML/31G 31G X 5/16" 1 ML MISC USE 1 TWICE DAILY AS DIRECTED    . simvastatin (ZOCOR) 20 MG tablet Take 20 mg by mouth  at bedtime.      . TRADJENTA 5 MG TABS tablet TAKE 1 TABLET BY MOUTH ONCE DAILY WITH BREAKFAST     Review of Systems PER HPI OTHERWISE ALL SYSTEMS ARE NEGATIVE.    Objective:   Physical Exam Constitutional:      General: She is not in acute distress.    Appearance: Normal appearance.  HENT:     Mouth/Throat:     Comments: MASK IN PLACE Eyes:     General: No scleral icterus.    Pupils: Pupils are equal, round, and reactive to light.  Neck:     Musculoskeletal: Normal range of motion.  Cardiovascular:     Rate and Rhythm: Normal rate and regular rhythm.     Pulses: Normal pulses.     Heart sounds: Normal heart sounds.  Pulmonary:     Effort: Pulmonary effort is normal.      Breath sounds: Normal breath sounds.  Abdominal:     General: Bowel sounds are normal.     Palpations: Abdomen is soft.     Tenderness: There is no abdominal tenderness.  Musculoskeletal:     Right lower leg: No edema.     Left lower leg: No edema.  Lymphadenopathy:     Cervical: No cervical adenopathy.  Skin:    General: Skin is warm and dry.  Neurological:     Mental Status: She is alert and oriented to person, place, and time.     Comments: NO  NEW FOCAL DEFICITS  Psychiatric:        Mood and Affect: Mood normal.     Comments: NORMAL AFFECT       Assessment & Plan:

## 2019-09-03 NOTE — Assessment & Plan Note (Signed)
SYMPTOMS NOT IDEALLY CONTROLLED.  TO TREAT CONSTIPATION, ADD LINZESS 30 MINS PRIOR TO BREAKFAST. YOU SHOULD HAVE A BM 1-3 HRS AFTER THE DOSE IF NOT TAKE THE LINZESS WITH BREAKFAST. IT CAN CAUSE EXPLOSIVE DIARRHEA. PLEASE CALL WITH QUESTIONS OR CONCERNS. FOLLOW UP IN 6 MOS.

## 2019-09-03 NOTE — Patient Instructions (Addendum)
TO TREAT CONSTIPATION, ADD LINZESS 30 MINS PRIOR TO BREAKFAST. YOU SHOULD HAVE A BM 1-3 HRS AFTER THE DOSE IF NOT TAKE THE LINZESS WITH BREAKFAST. IT CAN CAUSE EXPLOSIVE DIARRHEA. PLEASE CALL WITH QUESTIONS OR CONCERNS.  PLEASE CALL NEXT WEEK AND LET ME KNOW IF THE [employer]. IF NOT WE CAN TRY A HIGHER DOSE. IF SO, I WILL SEND IN A PRESCRIPTION.   COMPLETE THE MODIFIED BARIUM SWALLOW.   EAT TO LIVE AND THINK OF FOOD AS MEDICINE. 75% OF YOUR PLATE SHOULD BE FRUITS/VEGGIES.  To have more energy, and to lose weight:      1. CONTINUE YOUR WEIGHT LOSS EFFORTS. I RECOMMEND YOU READ AND FOLLOW RECOMMENDATIONS BY DR. MARK HYMAN, "10-DAY DETOX DIET".    2. If you must eat bread, EAT EZEKIEL BREAD. IT IS IN THE FROZEN SECTION OF THE GROCERY STORE.    3. DRINK WATER WITH FRUIT OR CUCUMBER ADDED. YOUR URINE SHOULD BE LIGHT YELLOW. AVOID SODA, GATORADE, ENERGY DRINKS, OR DIET SODA.     4. AVOID HIGH FRUCTOSE CORN SYRUP AND CAFFEINE.     5. DO NOT chew SUGAR FREE GUM OR USE ARTIFICIAL SWEETENERS. IF NEEDED USE STEVIA AS A SWEETENER.    6. DO NOT EAT ENRICHED WHEAT FLOUR, PASTA, RICE, OR CEREAL.    7. ONLY EAT WILD CAUGHT SEAFOOD, GRASS FED BEEF OR CHICKEN, PORK FROM PASTURE RAISE PIGS, OR EGGS FROM PASTURE RAISED CHICKENS.    8. PRACTICE CHAIR YOGA FOR 15-30 MINS 3 OR 4 TIMES A WEEK AND PROGRESS TO HATHA YOGA OVER NEXT 6 MOS.    9. START TAKING A MULTIVITAMIN, VITAMIN B12, AND VITAMIN D3 2000 IU DAILY.   ADDITIONAL SUPPLEMENTS TO DECREASE CRAVING AND SUPPRESS YOUR APPETITE:    1. CINNAMON 500 MG EVERY AM PRIOR TO FIRST MEAL.   **STABILIZES BLOOD GLUCOSE/REDUCES CRAVINGS**    2. CHROMIUM 400-500 MG WITH MEALS TWICE DAILY.    **FAT BURNER**    3. GREEN TEA EXTRACT ONE DAILY.   **FAT BURNER/SUPPRESSES YOUR APPETITE**    4. ALPHA LIPOIC ACID TWICE DAILY.   **NATURAL ANTI-INFLAMMATORY SUPPLEMENT THAT IS AN ALTERNATIVE TO IBUPROFEN OR NAPROXEN**  FOLLOW UP IN 6 MOS.

## 2019-09-03 NOTE — Assessment & Plan Note (Signed)
SYMPTOMS FAIRLY WELL CONTROLLED.  COMPLETE THE MODIFIED BARIUM SWALLOW. EAT TO LIVE AND THINK OF FOOD AS MEDICINE. 75% OF YOUR PLATE SHOULD BE FRUITS/VEGGIES. To have more energy, and to lose weight:      1. CONTINUE YOUR WEIGHT LOSS EFFORTS. I RECOMMEND YOU READ AND FOLLOW RECOMMENDATIONS BY DR. MARK HYMAN, "10-DAY DETOX DIET".   2. If you must eat bread, EAT EZEKIEL BREAD. IT IS IN THE FROZEN SECTION OF THE GROCERY STORE.   3. DRINK WATER WITH FRUIT OR CUCUMBER ADDED. YOUR URINE SHOULD BE LIGHT YELLOW. AVOID SODA, GATORADE, ENERGY DRINKS, OR DIET SODA.    4. AVOID HIGH FRUCTOSE CORN SYRUP AND CAFFEINE.    5. DO NOT chew SUGAR FREE GUM OR USE ARTIFICIAL SWEETENERS. IF NEEDED USE STEVIA AS A SWEETENER.   6. DO NOT EAT ENRICHED WHEAT FLOUR, PASTA, RICE, OR CEREAL.   7. ONLY EAT WILD CAUGHT SEAFOOD, GRASS FED BEEF OR CHICKEN, PORK FROM PASTURE RAISE PIGS, OR EGGS FROM PASTURE RAISED CHICKENS.   8. PRACTICE CHAIR YOGA FOR 15-30 MINS 3 OR 4 TIMES A WEEK AND PROGRESS TO HATHA YOGA OVER NEXT 6 MOS.   9. START TAKING A MULTIVITAMIN, VITAMIN B12, AND VITAMIN D3 2000 IU DAILY.   ADDITIONAL SUPPLEMENTS TO DECREASE CRAVING AND SUPPRESS YOUR APPETITE:    1. CINNAMON 500 MG EVERY AM PRIOR TO FIRST MEAL.   **STABILIZES BLOOD GLUCOSE/REDUCES CRAVINGS**   2. CHROMIUM 400-500 MG WITH MEALS TWICE DAILY.    **FAT BURNER**   3. GREEN TEA EXTRACT ONE DAILY.   **FAT BURNER/SUPPRESSES YOUR APPETITE**   4. ALPHA LIPOIC ACID TWICE DAILY.   **NATURAL ANTI-INFLAMMATORY SUPPLEMENT THAT IS AN ALTERNATIVE TO IBUPROFEN OR NAPROXEN**  FOLLOW UP IN 6 MOS.

## 2019-09-07 DIAGNOSIS — E1165 Type 2 diabetes mellitus with hyperglycemia: Secondary | ICD-10-CM | POA: Diagnosis not present

## 2019-09-07 DIAGNOSIS — R21 Rash and other nonspecific skin eruption: Secondary | ICD-10-CM | POA: Diagnosis not present

## 2019-09-07 DIAGNOSIS — I10 Essential (primary) hypertension: Secondary | ICD-10-CM | POA: Diagnosis not present

## 2019-09-16 ENCOUNTER — Telehealth: Payer: Self-pay | Admitting: Gastroenterology

## 2019-09-16 MED ORDER — LINACLOTIDE 72 MCG PO CAPS: 72.0000 ug | ORAL_CAPSULE | Freq: Every day | ORAL | 11 refills | Status: DC

## 2019-09-16 NOTE — Telephone Encounter (Signed)
Pt said her Linzess samples were working and needed a prescription called into [facility]

## 2019-09-16 NOTE — Telephone Encounter (Signed)
PLEASE CALL PT.  Rx sent.  

## 2019-09-16 NOTE — Addendum Note (Signed)
Addended by: Danie Binder on: 09/16/2019 03:32 PM   Modules accepted: Orders

## 2019-09-16 NOTE — Telephone Encounter (Signed)
Forwarding to Dr. Fields to send in Rx.  

## 2019-09-28 IMAGING — CR DG CHEST 2V
2 series · 2 of 2 positions shown · non-contrast
Comparison: 05/31/2010.

CLINICAL DATA: Preop for lumbar surgery. History of asthma and
diabetes.

EXAM:
CHEST - 2 VIEW

[w chest pa]
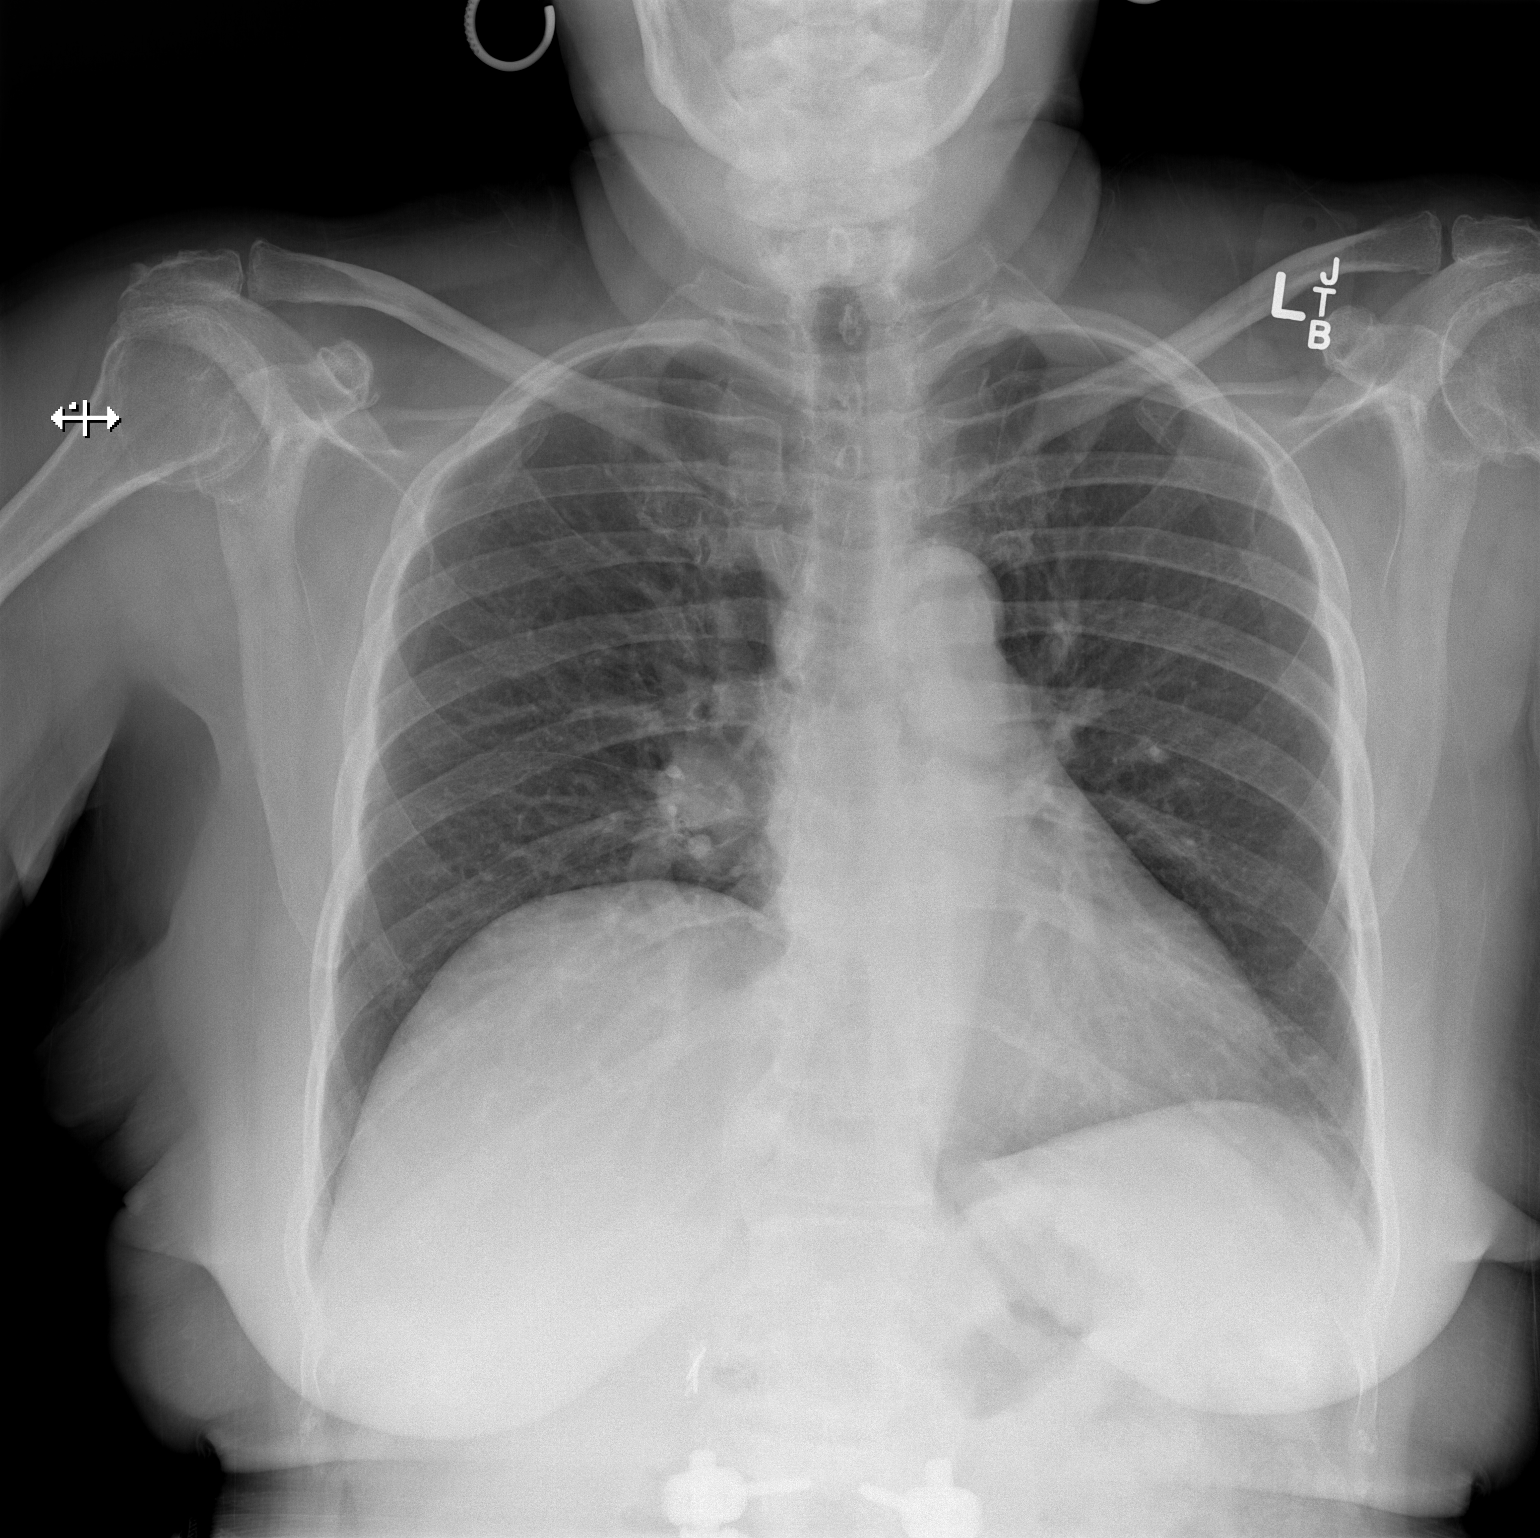

[w chest lat]
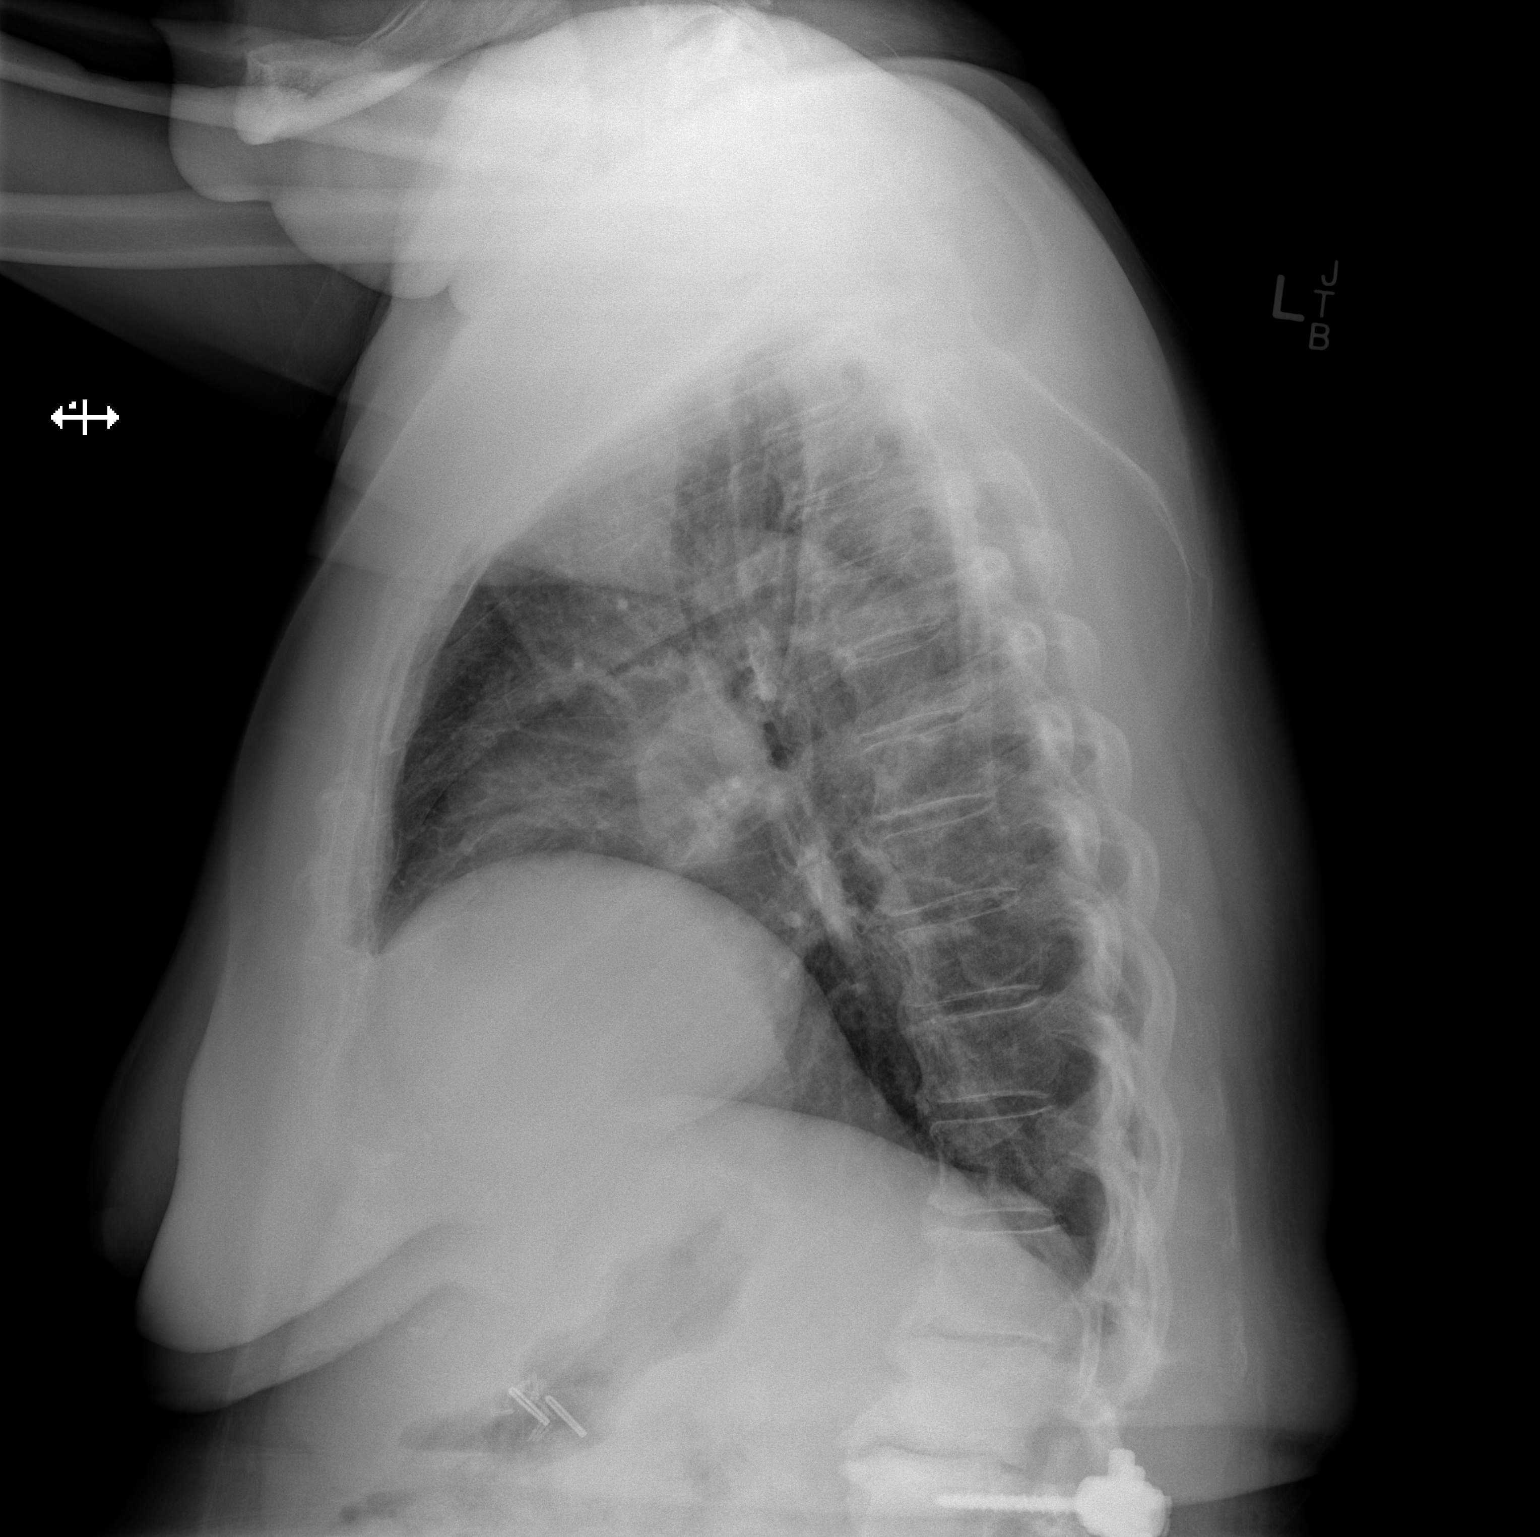

[2 of 2 positions shown; findings below may reference images not displayed]

FINDINGS: Mediastinum and hilar structures are normal. Lungs are clear. No
pleural effusion or pneumothorax. Heart size normal. Surgical clips
right upper quadrant. Prior lumbar spine fusion. Degenerative
changes both shoulders and thoracolumbar spine.
IMPRESSION: No acute cardiopulmonary disease.

## 2019-10-02 IMAGING — RF DG THORACOLUMBAR SPINE 2V
1 series · 9 of 9 positions shown · non-contrast
Comparison: None.

CLINICAL DATA: Status post surgical posterior fusion of
thoracolumbar junction.

EXAM:
DG C-ARM 61-120 MIN; THORACOLUMBAR SPINE - 2 VIEW
Radiation exposure index: 67.14 mGy.

[Series 1: run · 9 of 9 slices shown]
[im 1/9]
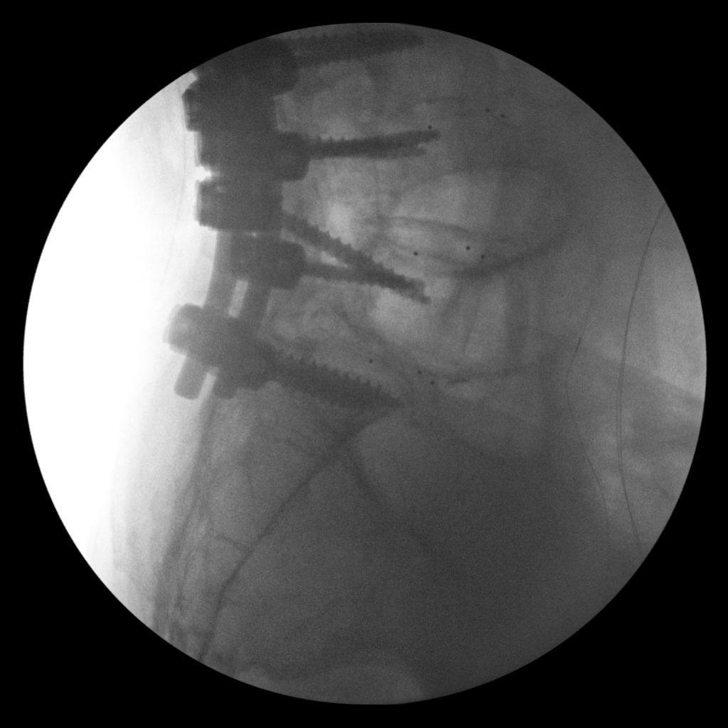
[im 2/9]
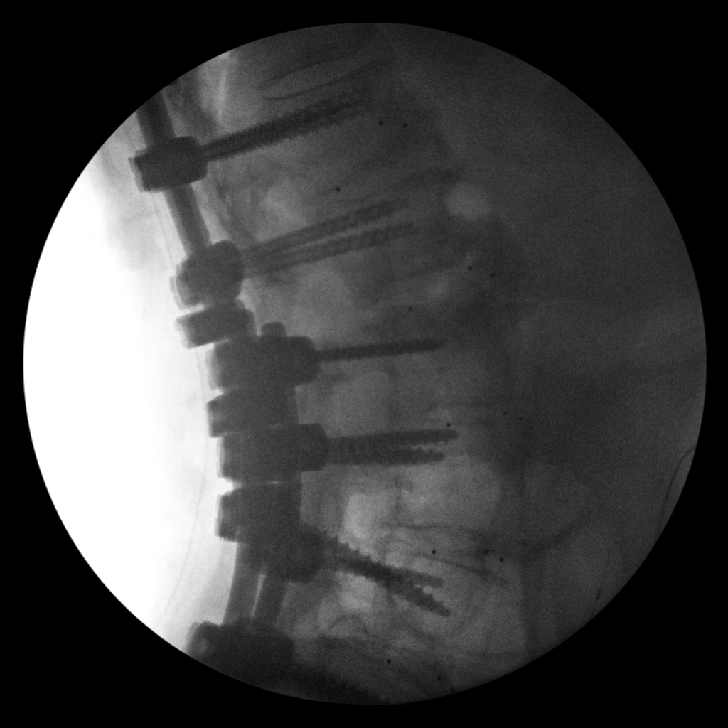
[im 3/9]
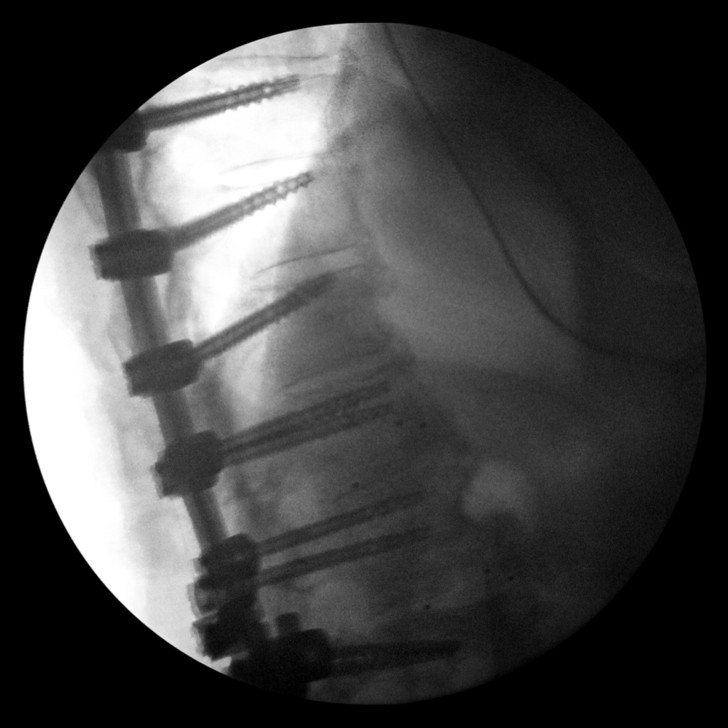
[im 4/9]
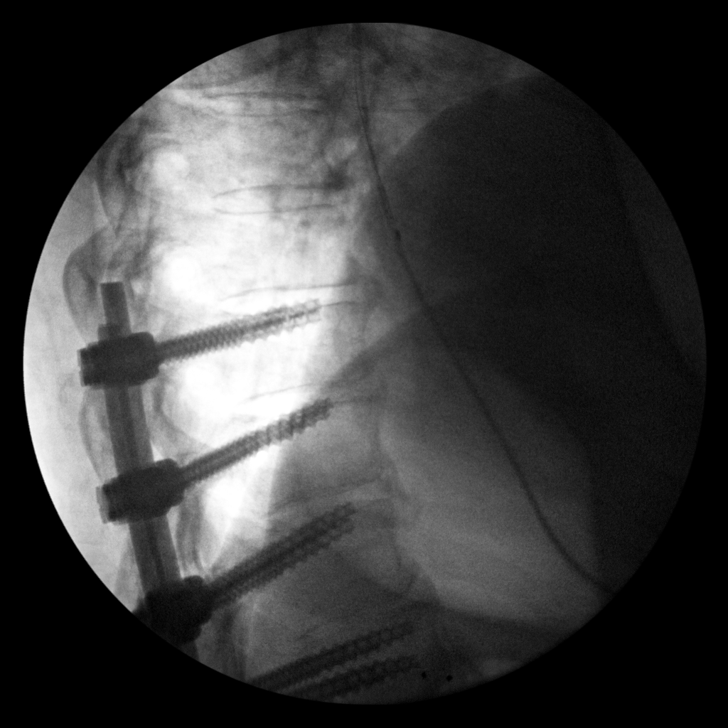
[im 5/9]
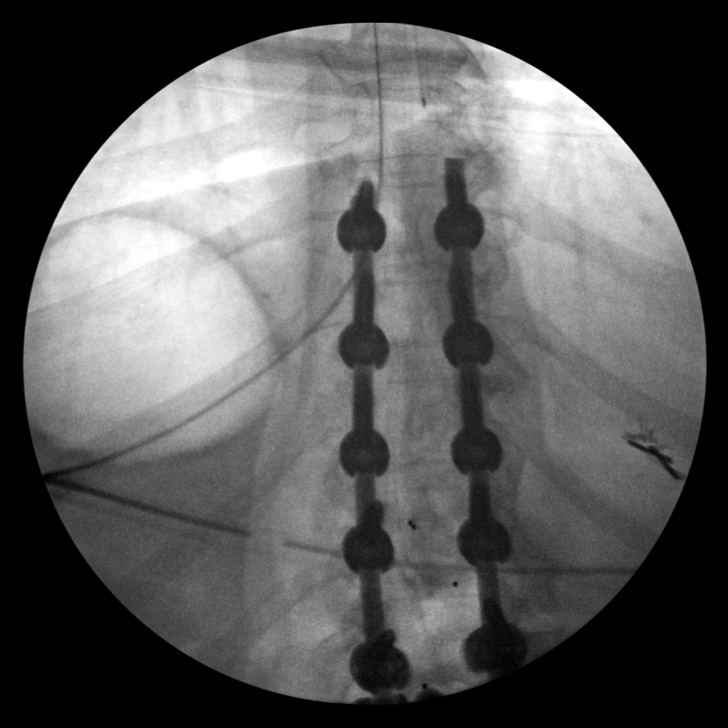
[im 6/9]
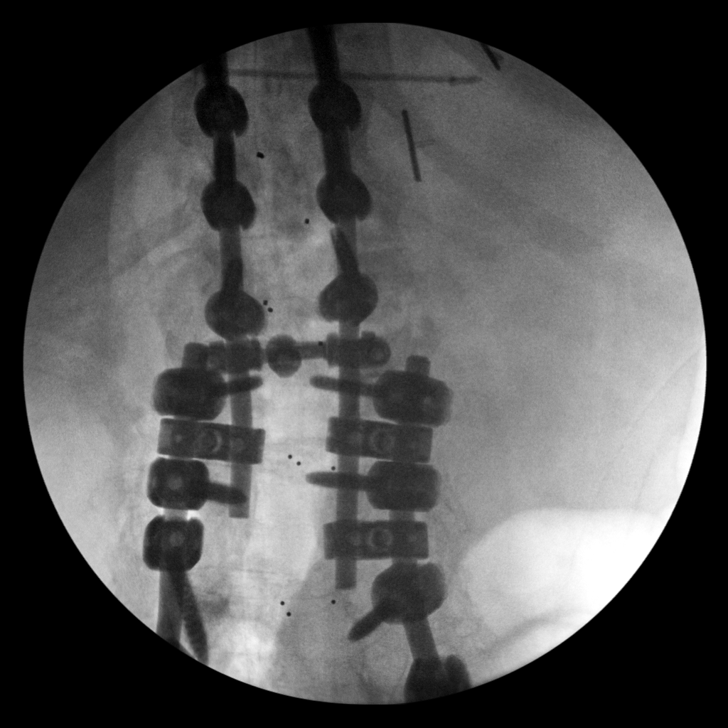
[im 7/9]
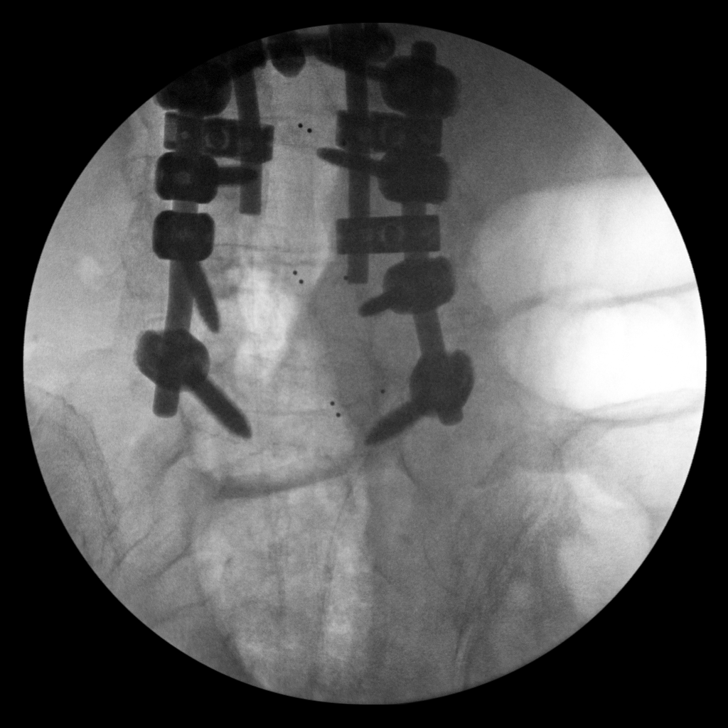
[im 8/9]
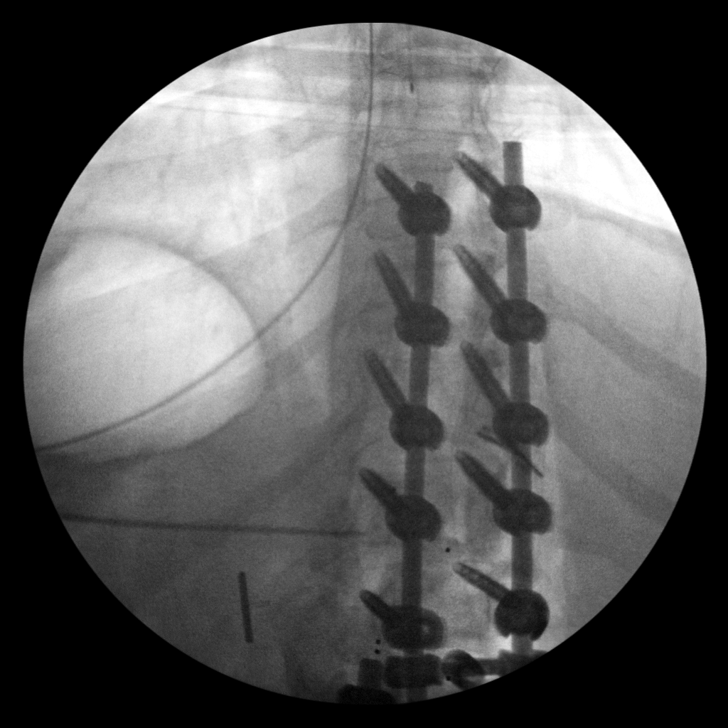
[im 9/9]
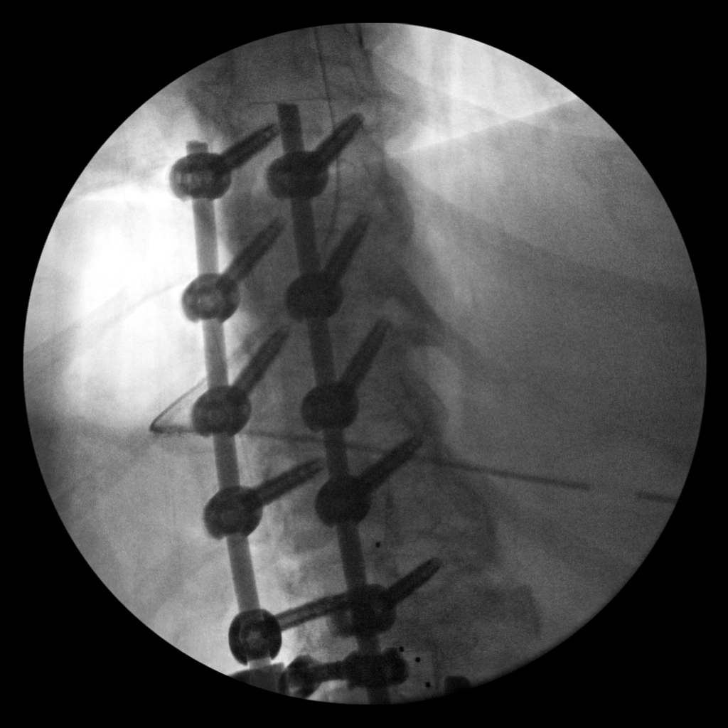

[9 of 9 positions shown; findings below may reference images not displayed]

FINDINGS: Nine intraoperative fluoroscopic images were obtained of the lower
thoracic and lumbar spine. These images demonstrate the patient be
status post surgical posterior fusion extending from T10-L3 with
bilateral intrapedicular screw placement. Previous surgical fusion
of lower lumbar spine is noted.
IMPRESSION: Status post surgical posterior fusion of thoracolumbar junction.

## 2019-10-27 ENCOUNTER — Telehealth: Payer: Self-pay | Admitting: Gastroenterology

## 2019-10-27 DIAGNOSIS — R1310 Dysphagia, unspecified: Secondary | ICD-10-CM

## 2019-10-27 NOTE — Telephone Encounter (Signed)
[phone]  PLEASE CALL PATIENT, SHE STATED SHE IS SUPPOSED TO HAVE A MODIFIED BARIUM SWALLOW AND WOULD LIKE TO GO AHEAD AND SCHEDULE IT

## 2019-10-27 NOTE — Telephone Encounter (Signed)
Previous referral was cancelled as patient declined. New referral has been sent

## 2019-10-27 NOTE — Addendum Note (Signed)
Addended by: Armstead Peaks on: 10/27/2019 12:04 PM   Modules accepted: Orders

## 2019-10-28 ENCOUNTER — Other Ambulatory Visit (HOSPITAL_COMMUNITY): Payer: Self-pay | Admitting: Specialist

## 2019-10-28 DIAGNOSIS — R1319 Other dysphagia: Secondary | ICD-10-CM

## 2019-11-03 ENCOUNTER — Ambulatory Visit (HOSPITAL_COMMUNITY): Payer: Medicare Other | Attending: Gastroenterology | Admitting: Speech Pathology

## 2019-11-03 ENCOUNTER — Encounter (HOSPITAL_COMMUNITY): Payer: Self-pay | Admitting: Speech Pathology

## 2019-11-03 ENCOUNTER — Other Ambulatory Visit: Payer: Self-pay

## 2019-11-03 ENCOUNTER — Ambulatory Visit (HOSPITAL_COMMUNITY)
Admission: RE | Admit: 2019-11-03 | Discharge: 2019-11-03 | Disposition: A | Discharge status: Home / Self Care | Payer: Medicare Other | Source: Ambulatory Visit | Attending: Gastroenterology | Admitting: Gastroenterology

## 2019-11-03 DIAGNOSIS — R05 Cough: Secondary | ICD-10-CM | POA: Diagnosis not present

## 2019-11-03 DIAGNOSIS — R1312 Dysphagia, oropharyngeal phase: Secondary | ICD-10-CM | POA: Insufficient documentation

## 2019-11-03 DIAGNOSIS — R1310 Dysphagia, unspecified: Secondary | ICD-10-CM | POA: Diagnosis not present

## 2019-11-03 DIAGNOSIS — R1319 Other dysphagia: Secondary | ICD-10-CM | POA: Diagnosis not present

## 2019-11-03 NOTE — Therapy (Signed)
[place], [place], [address] Phone: [phone]   Fax:  [phone]  Modified Barium Swallow  Patient Details  Name: [name] MRN: [id number] Date of Birth: 10-20-1950 No data recorded  Encounter Date: 11/03/2019  End of Session - 11/03/19 1427    Visit Number  1    Number of Visits  1    Authorization Type  [employer] Medicare    SLP Start Time  1330    SLP Stop Time   1356    SLP Time Calculation (min)  26 min    Activity Tolerance  Patient tolerated treatment well       Past Medical History:  Diagnosis Date  . Asthma   . Bronchitis   . Diabetes mellitus   . Hyperlipidemia   . Hypertension   . Low back pain   . Migraine   . Osteoarthritis   . Sciatica   . Varicose veins   . Vertigo     Past Surgical History:  Procedure Laterality Date  . ABDOMINAL HYSTERECTOMY    . BIOPSY  05/12/2019   Procedure: BIOPSY;  Surgeon: Danie Binder, MD;  [facility];  Service: Endoscopy;;  . CARPAL TUNNEL RELEASE Bilateral   . CESAREAN SECTION    . ESOPHAGOGASTRODUODENOSCOPY (EGD) WITH PROPOFOL N/A 05/12/2019   Procedure: ESOPHAGOGASTRODUODENOSCOPY (EGD) WITH PROPOFOL;  Surgeon: Danie Binder, MD;  [facility];  Service: Endoscopy;  Laterality: N/A;  12:00pm  . INGUINAL HERNIA REPAIR Left   . LUMBAR FUSION    . POSTERIOR LUMBAR FUSION 4 LEVEL N/A 03/04/2018   Procedure: Right transforaminal lumbar interbody fusion L1-2, L2-3 Posterior fusion T10, T11, T12, L1, L2 with T 11, T12 pedicle screws, superior sublaminar hooks T10, local bone graft, allograft cancellous chips Vivigen;  Surgeon: Jessy Oto, MD;  Location: [facility];  Service: Orthopedics;  Laterality: N/A;  . SAVORY DILATION N/A 05/12/2019   Procedure: SAVORY DILATION;  Surgeon: Danie Binder, MD;  [facility];  Service: Endoscopy;  Laterality: N/A;    There were no vitals filed for this visit.  Subjective  Assessment - 11/03/19 1416    Subjective  "I have trouble with meats and pills."    Special Tests  MBSS    Currently in Pain?  No/denies          General - 11/03/19 1417      General Information   Date of Onset  09/03/19    HPI  [name] is a 69 yo female who was referred by Dr. Oneida Alar for MBSS due to Pt with reports of dysphagia. Pt reports difficulty swallowing french fries, meats, and pills. She wakes up at night with burning pain and is SOB walking to the mailbox. She is edentulous.    Type of Study  MBS-Modified Barium Swallow Study    Previous Swallow Assessment  EGD in July 2020    Diet Prior to this Study  Regular;Thin liquids    Temperature Spikes Noted  No    Respiratory Status  Room air    History of Recent Intubation  No    Behavior/Cognition  Alert;Cooperative;Pleasant mood    Oral Cavity Assessment  Within Functional Limits    Oral Care Completed by SLP  No    Oral Cavity - Dentition  Edentulous    Vision  Functional for self feeding    Self-Feeding Abilities  Able to feed self  Patient Positioning  Upright in chair    Baseline Vocal Quality  Normal    Volitional Cough  Strong    Volitional Swallow  Able to elicit    Anatomy  Within functional limits    Pharyngeal Secretions  Not observed secondary MBS         Oral Preparation/Oral Phase - 11/03/19 1425      Oral Preparation/Oral Phase   Oral Phase  Impaired      Oral - Solids   Oral - Regular  Piecemeal swallowing;Within functional limits   Pt edentulous     Electrical stimulation - Oral Phase   Was Electrical Stimulation Used  No       Pharyngeal Phase - 11/03/19 1425      Pharyngeal Phase   Pharyngeal Phase  Impaired      Pharyngeal - Thin   Pharyngeal- Thin Teaspoon  Within functional limits;Swallow initiation at vallecula    Pharyngeal- Thin Cup  Within functional limits;Swallow initiation at pyriform sinus    Pharyngeal- Thin Straw  Within functional limits      Pharyngeal  - Solids   Pharyngeal- Regular  Within functional limits    Pharyngeal- Pill  Within functional limits      Electrical Stimulation - Pharyngeal Phase   Was Electrical Stimulation Used  No       Cricopharyngeal Phase - 11/03/19 1426      Cervical Esophageal Phase   Cervical Esophageal Phase  Within functional limits        Plan - 11/03/19 1428    Clinical Impression Statement  MBSS completed this date and found to be WNL for edentulous status and age. Pt assessed with barium tinged thin (tsp/cup/straw), puree, regular textures, and pill. Swallow trigger is timely and hyolaryngeal excusion is WNL. No penetration, aspiration, or pharyngeal residuals noted. Esophageal sweep was unremarkable. Pt reports a "burning" sensation in chest at night and SOB when walking to and from the mailbox. Pt's reports of difficulty with french fries, meats, and pills may be due to esophageal dysphagia. Pt is edentulous and was encouraged to chop meats finely and implement reflux precautions. No further SLP services indicated at this time. Pt states she will try to elevate upper body at night to see if this helps symptoms over night.    Consulted and Agree with Plan of Care  Patient       Patient will benefit from skilled therapeutic intervention in order to improve the following deficits and impairments:   Dysphagia, oropharyngeal phase    Recommendations/Treatment - 11/03/19 1426      Swallow Evaluation Recommendations   SLP Diet Recommendations  Thin;Age appropriate regular   chop meats finely   Liquid Administration via  Cup;Straw    Medication Administration  Whole meds with liquid    Supervision  Patient able to self feed    Postural Changes  Seated upright at 90 degrees;Remain upright for at least 30 minutes after feeds/meals         Problem List Patient Active Problem List   Diagnosis Date Noted  . Constipation 09/03/2019  . Dysphagia 08/11/2018  . Leg swelling 08/11/2018  . Bilateral  carotid bruits 08/11/2018  . Abnormal EKG 08/06/2018  . Acute blood loss as cause of postoperative anemia 03/07/2018    Class: Acute  . Spinal stenosis, lumbar region with neurogenic claudication 03/04/2018    Class: Chronic  . Multilevel degenerative disc disease 03/04/2018    Class: Chronic  . Forestier's disease of  thoracolumbar region 03/04/2018    Class: Chronic  . Fusion of spine of thoracolumbar region 03/04/2018  . Pain in right hip 03/18/2017  . Stiffness of joint, not elsewhere classified, other specified site 08/04/2013  . Leg weakness, bilateral 08/04/2013  . CHEST PAIN UNSPECIFIED 05/10/2009  . BENIGN POSITIONAL VERTIGO 07/30/2007  . VARICOSE VEINS, LOWER EXTREMITIES 07/30/2007  . SCIATICA, CHRONIC 07/30/2007  . DIABETES MELLITUS, TYPE II 11/18/2006  . Hyperlipidemia 11/18/2006  . MIGRAINE HEADACHE 11/18/2006  . LESION, SCIATIC NERVE 11/18/2006  . Essential hypertension 11/18/2006  . HYPOTENSION, ORTHOSTATIC 11/18/2006  . BRONCHITIS NOS 11/18/2006  . Other emphysema (HCC) 11/18/2006  . ASTHMA 11/18/2006  . OSTEOARTHRITIS 11/18/2006  . LOW BACK PAIN 11/18/2006  . VERTIGO 11/18/2006   Thank you,  Havery Moros, CCC-SLP [phone]  [facility] 11/03/2019, 2:38 PM  [facility] [address] [place], [place], [address] Phone: [phone]   Fax:  [phone]  Name: [name] MRN: [id number] Date of Birth: 28-Sep-1951

## 2019-11-04 ENCOUNTER — Telehealth: Payer: Self-pay | Admitting: Gastroenterology

## 2019-11-04 NOTE — Telephone Encounter (Signed)
PT is aware and I am mailing her a soft mechanical diet.

## 2019-11-04 NOTE — Telephone Encounter (Signed)
PLEASE CALL PT. HER .SWALLOWING EVALUATION WAS NORMAL. HER DIFFICULTY SWALLOWING IS LIKELY DUE TO HER INABILITY TO CHEW FOOD UP TO A SIZE THAT THE ESOPHAGUS CAN ACCOMMODATE. IF SHE HAS TROUBLE CONTROLLING HER HEARTBURN THAT CAN ALSO CAUSE DIFFICULTY SWALLOWING. NO ADDITIONAL WORKUP IS NEEDED AT THIS TIME.  SHE SHOULD:    1. CONSIDER GETTING DENTURES.    2. FOLLOW A SOFT MECHANICAL DIET.  MEATS SHOULD BE GROUND ONLY. ALL FRUITS/VEGGIES SHOULD BE SOFT LIKE MASHED POTATOES.  SEND INFO BELOW.    3. EAT TO LIVE AND THINK OF FOOD AS MEDICINE. 75% OF YOUR PLATE SHOULD BE FRUITS/VEGGIES.  CONTINUE YOUR WEIGHT LOSS EFFORTS.     4. CONTINUE PROTONIX. TAKE 30 MINUTES PRIOR TO BREAKFAST.    This SOFT MECHANICAL DIET is restricted to:  Foods that are moist, soft-textured, and easy to chew and swallow.   Meats that are ground or are minced no larger than one-quarter inch pieces. Meats are moist with gravy or sauce added.   Foods that do not include bread or bread-like textures except soft pancakes, well-moistened with syrup or sauce.   Textures with some chewing ability required.   Casseroles without rice.   Cooked vegetables that are less than half an inch in size and easily mashed with a fork. No cooked corn, peas, broccoli, cauliflower, cabbage, Brussels sprouts, asparagus, or other fibrous, non-tender or rubbery cooked vegetables.   Canned fruit except for pineapple. Fruit must be cut into pieces no larger than half an inch in size.   Foods that do not include nuts, seeds, coconut, or sticky textures.

## 2019-11-09 DIAGNOSIS — E1169 Type 2 diabetes mellitus with other specified complication: Secondary | ICD-10-CM | POA: Diagnosis not present

## 2019-11-09 DIAGNOSIS — R600 Localized edema: Secondary | ICD-10-CM | POA: Diagnosis not present

## 2019-11-09 DIAGNOSIS — R21 Rash and other nonspecific skin eruption: Secondary | ICD-10-CM | POA: Diagnosis not present

## 2019-11-27 ENCOUNTER — Other Ambulatory Visit: Payer: Self-pay

## 2019-11-27 ENCOUNTER — Telehealth (HOSPITAL_COMMUNITY): Payer: Self-pay

## 2019-11-27 DIAGNOSIS — I6522 Occlusion and stenosis of left carotid artery: Secondary | ICD-10-CM

## 2019-11-27 NOTE — Telephone Encounter (Signed)

## 2019-11-30 ENCOUNTER — Ambulatory Visit: Payer: Medicare HMO

## 2019-11-30 ENCOUNTER — Encounter (HOSPITAL_COMMUNITY): Payer: Medicare HMO

## 2019-12-24 ENCOUNTER — Ambulatory Visit: Payer: Self-pay

## 2019-12-24 ENCOUNTER — Other Ambulatory Visit: Payer: Self-pay

## 2019-12-24 ENCOUNTER — Encounter: Payer: Self-pay | Admitting: Surgery

## 2019-12-24 ENCOUNTER — Ambulatory Visit (INDEPENDENT_AMBULATORY_CARE_PROVIDER_SITE_OTHER): Payer: Medicare Other | Admitting: Surgery

## 2019-12-24 VITALS — BP 132/73 | HR 107

## 2019-12-24 DIAGNOSIS — M47818 Spondylosis without myelopathy or radiculopathy, sacral and sacrococcygeal region: Secondary | ICD-10-CM

## 2019-12-24 DIAGNOSIS — M4326 Fusion of spine, lumbar region: Secondary | ICD-10-CM | POA: Diagnosis not present

## 2019-12-24 DIAGNOSIS — M461 Sacroiliitis, not elsewhere classified: Secondary | ICD-10-CM

## 2019-12-24 NOTE — Progress Notes (Signed)
Office Visit Note   Patient: [name]           Date of Birth: [birth year]           MRN: [id number] Visit Date: 12/24/2019              [name], [place] [place] [address] [place],  [name] [name]   Assessment & Plan: Visit Diagnoses:  1. Fusion of spine of lumbar region   2. SI joint arthritis     Plan: This patient localizes her pain to the right SI joint I asked [name] to perform an ultrasound-guided diagnostic/therapeutic right SI joint injection.  He agreed to do that today.  I will have patient follow-up with Dr. Louanne Reid in 3 months for recheck.  Patient will return sooner if needed.  Follow-Up Instructions: Return in about 3 months (around 03/25/2020) for With Dr. Louanne Reid.   Orders:  Orders Placed This Encounter  Procedures  . XR Lumbar Spine 2-3 Views   No orders of the defined types were placed in this encounter.     Procedures: No procedures performed   Clinical Data: No additional findings.   Subjective: Chief Complaint  Patient presents with  . Lower Back - Follow-up    HPI 69 year old black female comes in with complaints of right-sided low back pain.  She has had previous T10-S1 fusion.  Localizes pain to the right SI joint.  No injury.  This has been worsening over the last several weeks.  She is had some spasms in the right thigh.  No complaints of groin pain.  Objective: Vital Signs: BP 132/73   Pulse (!) 107   Physical Exam HENT:     Head: Normocephalic and atraumatic.  Eyes:     Extraocular Movements: Extraocular movements intact.     Pupils: Pupils are equal, round, and reactive to light.  Pulmonary:     Effort: No respiratory distress.  Musculoskeletal:     Comments: Patient has moderate to marked tenderness over the right SI joint.  Nontender left side.  Negative straight leg raise.  Negative logroll bilateral hips.  Neurological:     Mental Status: She is alert and oriented to  person, place, and time.     Ortho Exam  Specialty Comments:  No specialty comments available.  Imaging: XR Lumbar Spine 2-3 Views  Result Date: 12/24/2019 Fusion T10-S1 looks solid.  Patient does have right greater than left sided SI joint arthritis.    PMFS History: Patient Active Problem List   Diagnosis Date Noted  . Constipation 09/03/2019  . Dysphagia 08/11/2018  . Leg swelling 08/11/2018  . Bilateral carotid bruits 08/11/2018  . Abnormal EKG 08/06/2018  . Acute blood loss as cause of postoperative anemia 03/07/2018    Class: Acute  . Spinal stenosis, lumbar region with neurogenic claudication 03/04/2018    Class: Chronic  . Multilevel degenerative disc disease 03/04/2018    Class: Chronic  . Forestier's disease of thoracolumbar region 03/04/2018    Class: Chronic  . Fusion of spine of thoracolumbar region 03/04/2018  . Pain in right hip 03/18/2017  . Stiffness of joint, not elsewhere classified, other specified site 08/04/2013  . Leg weakness, bilateral 08/04/2013  . CHEST PAIN UNSPECIFIED 05/10/2009  . BENIGN POSITIONAL VERTIGO 07/30/2007  . VARICOSE VEINS, LOWER EXTREMITIES 07/30/2007  . SCIATICA, CHRONIC 07/30/2007  . DIABETES MELLITUS, TYPE II 11/18/2006  . Hyperlipidemia 11/18/2006  . MIGRAINE HEADACHE 11/18/2006  .  LESION, SCIATIC NERVE 11/18/2006  . Essential hypertension 11/18/2006  . HYPOTENSION, ORTHOSTATIC 11/18/2006  . BRONCHITIS NOS 11/18/2006  . Other emphysema (HCC) 11/18/2006  . ASTHMA 11/18/2006  . OSTEOARTHRITIS 11/18/2006  . LOW BACK PAIN 11/18/2006  . VERTIGO 11/18/2006   Past Medical History:  Diagnosis Date  . Asthma   . Bronchitis   . Diabetes mellitus   . Hyperlipidemia   . Hypertension   . Low back pain   . Migraine   . Osteoarthritis   . Sciatica   . Varicose veins   . Vertigo     Family History  Problem Relation Age of Onset  . Lung cancer Mother   . Stroke Father   . Heart failure Sister   . Heart failure  Sister   . Colon cancer Neg Hx   . Gastric cancer Neg Hx   . Esophageal cancer Neg Hx     Past Surgical History:  Procedure Laterality Date  . ABDOMINAL HYSTERECTOMY    . BIOPSY  05/12/2019   Procedure: BIOPSY;  Surgeon: West Bali, Reid;  Location: [facility];  Service: Endoscopy;;  . CARPAL TUNNEL RELEASE Bilateral   . CESAREAN SECTION    . ESOPHAGOGASTRODUODENOSCOPY (EGD) WITH PROPOFOL N/A 05/12/2019   Procedure: ESOPHAGOGASTRODUODENOSCOPY (EGD) WITH PROPOFOL;  Surgeon: West Bali, Reid;  Location: [facility];  Service: Endoscopy;  Laterality: N/A;  12:00pm  . INGUINAL HERNIA REPAIR Left   . LUMBAR FUSION    . POSTERIOR LUMBAR FUSION 4 LEVEL N/A 03/04/2018   Procedure: Right transforaminal lumbar interbody fusion L1-2, L2-3 Posterior fusion T10, T11, T12, L1, L2 with T 11, T12 pedicle screws, superior sublaminar hooks T10, local bone graft, allograft cancellous chips Vivigen;  Surgeon: Kerrin Champagne, Reid;  Location: [facility];  Service: Orthopedics;  Laterality: N/A;  . SAVORY DILATION N/A 05/12/2019   Procedure: SAVORY DILATION;  Surgeon: West Bali, Reid;  Location: [facility];  Service: Endoscopy;  Laterality: N/A;   Social History   Occupational History  . Occupation: Disabled    Employer: UNEMPLOYED  Tobacco Use  . Smoking status: Never Smoker  . Smokeless tobacco: Never Used  Substance and Sexual Activity  . Alcohol use: No    Alcohol/week: 0.0 standard drinks  . Drug use: No  . Sexual activity: Yes    Birth control/protection: Surgical

## 2019-12-24 NOTE — Progress Notes (Signed)
Subjective: Patient is here for ultrasound-guided intra-articular right SI joint injection.     Objective:  Tender over right SIJ.  Procedure: Ultrasound-guided right SI injection: After sterile prep with Betadine, injected 5 cc 1% lidocaine without epinephrine and 40 mg methylprednisolone using a 22-gauge spinal needle, passing the needle through the sacroiliac ligament into the region of the SI joint.  Good immediate relief.

## 2020-01-07 ENCOUNTER — Other Ambulatory Visit: Payer: Self-pay

## 2020-01-07 ENCOUNTER — Ambulatory Visit: Payer: Medicare Other | Admitting: Physician Assistant

## 2020-01-07 ENCOUNTER — Ambulatory Visit (HOSPITAL_COMMUNITY)
Admission: RE | Admit: 2020-01-07 | Discharge: 2020-01-07 | Disposition: A | Discharge status: Home / Self Care | Payer: Medicare Other | Source: Ambulatory Visit | Attending: Surgery | Admitting: Surgery

## 2020-01-07 DIAGNOSIS — I6522 Occlusion and stenosis of left carotid artery: Secondary | ICD-10-CM | POA: Insufficient documentation

## 2020-01-07 DIAGNOSIS — I6523 Occlusion and stenosis of bilateral carotid arteries: Secondary | ICD-10-CM

## 2020-01-07 DIAGNOSIS — I6529 Occlusion and stenosis of unspecified carotid artery: Secondary | ICD-10-CM | POA: Insufficient documentation

## 2020-01-07 NOTE — Progress Notes (Signed)
Established Carotid Patient   History of Present Illness   [name] is a 69 y.o. (04-Nov-1950) adult who presents for carotid artery stenosis surveillance.  She has no history of CVA or TIA and no prior intervention to either carotid artery.  Over the past year since last office visit she denies any strokelike symptoms including slurring speech, changes in vision, or one-sided weakness.  She had a carotid duplex in 04/2017 which demonstrated moderate velocities in left ICA however these have not been replicated on ultrasound since that study was performed.  She denies any tobacco use.  She is taking an aspirin and statin daily.  She is following regularly with her PCP for management of insulin-dependent diabetes mellitus as well as hypertension.  She has pain in her right hip and thigh when she walks however is currently being worked up due to recent plain film demonstrating osteoarthritis.   The patient's PMH, PSH, SH, and FamHx were reviewed on and are unchanged from prior visit.  Current Outpatient Medications  Medication Sig Dispense Refill  . amLODipine (NORVASC) 10 MG tablet Take 10 mg by mouth daily.     Marland Kitchen aspirin 81 MG chewable tablet Chew 81 mg by mouth daily.    . insulin NPH-insulin regular (NOVOLIN 70/30) (70-30) 100 UNIT/ML injection Inject 67 Units into the skin 2 (two) times daily.     Marland Kitchen linaclotide (LINZESS) 72 MCG capsule Take 1 capsule (72 mcg total) by mouth daily before breakfast. 30 capsule 11  . losartan (COZAAR) 50 MG tablet Take 50 mg by mouth daily.  4  . metFORMIN (GLUCOPHAGE) 1000 MG tablet Take 1,000 mg by mouth at bedtime.    . methocarbamol (ROBAXIN) 500 MG tablet Take 1 tablet (500 mg total) by mouth every 8 (eight) hours as needed for muscle spasms. 270 tablet 3  . metoprolol tartrate (LOPRESSOR) 100 MG tablet Take 100 mg 2 hours before cardiac CT 1 tablet 0  . nitroGLYCERIN (NITROSTAT) 0.4 MG SL tablet Place 0.4 mg under the tongue every 5  (five) minutes as needed for chest pain. Dissolve one tablet under the tongue every 5 mintues as needed for chest pain.    Glory Rosebush VERIO test strip See admin instructions.  2  . pantoprazole (PROTONIX) 40 MG tablet Take 40 mg by mouth daily.    Marland Kitchen RELION INSULIN SYRINGE 1ML/31G 31G X 5/16" 1 ML MISC USE 1 TWICE DAILY AS DIRECTED  2  . simvastatin (ZOCOR) 20 MG tablet Take 20 mg by mouth at bedtime.      . TRADJENTA 5 MG TABS tablet TAKE 1 TABLET BY MOUTH ONCE DAILY WITH BREAKFAST     No current facility-administered medications for this visit.    REVIEW OF SYSTEMS (negative unless checked):   Cardiac:  []  Chest pain or chest pressure? []  Shortness of breath upon activity? []  Shortness of breath when lying flat? []  Irregular heart rhythm?  Vascular:  [x]  Pain in calf, thigh, or hip brought on by walking? []  Pain in feet at night that wakes you up from your sleep? []  Blood clot in your veins? []  Leg swelling?  Pulmonary:  []  Oxygen at home? []  Productive cough? []  Wheezing?  Neurologic:  []  Sudden weakness in arms or legs? []  Sudden numbness in arms or legs? []  Sudden onset of difficult speaking or slurred speech? []  Temporary loss of vision in one eye? []  Problems with dizziness?  Gastrointestinal:  []  Blood in stool? []  Vomited blood?  Genitourinary:  []  Burning when urinating? []  Blood in urine?  Psychiatric:  []  Major depression  Hematologic:  []  Bleeding problems? []  Problems with blood clotting?  Dermatologic:  []  Rashes or ulcers?  Constitutional:  []  Fever or chills?  Ear/Nose/Throat:  []  Change in hearing? []  Nose bleeds? []  Sore throat?  Musculoskeletal:  []  Back pain? []  Joint pain? []  Muscle pain?   Physical Examination   Vitals:   01/07/20 0924 01/07/20 0933  BP: 120/70 (!) 144/70  Pulse: 89   Resp: 16   Temp: 98.2 F (36.8 C)   TempSrc: Oral   SpO2: 96%   Weight: 183 lb 9.6 oz (83.3 kg)   Height: 5\' 5"  (1.651 m)    Body  mass index is 30.55 kg/m.  General:  WDWN in NAD; vital signs documented above Gait: Not observed HENT: WNL, normocephalic Pulmonary: normal non-labored breathing , without Rales, rhonchi,  wheezing Cardiac: regular HR Abdomen: soft, NT, no masses Skin: without rashes Vascular Exam/Pulses:  Right Left  Radial 2+ (normal) 2+ (normal)  DP 2+ (normal) 2+ (normal)   Extremities: without ischemic changes, without Gangrene , without cellulitis; without open wounds; she is wearing compression stockings knee-high bilateral lower extremities however easily palpable DP pulses Musculoskeletal: no muscle wasting or atrophy  Neurologic: A&O X 3;  No focal weakness or paresthesias are detected Psychiatric:  The pt has Normal affect.  Non-Invasive Vascular Imaging   B Carotid Duplex :   R ICA stenosis:  1-39%  R VA:  patent and antegrade  L ICA stenosis:  1-39%  L VA:  patent and antegrade   Medical Decision Making   DOMINQUE LEVANDOWSKI is a 69 y.o. adult who presents with surveillance of asymptomatic carotid artery stenosis   Carotid duplex unchanged over the past year demonstrating 1 to 39% stenosis of bilateral internal carotid arteries  Continue aspirin and statin daily  Continue regular follow-up with PCP for management of chronic medical conditions including insulin-dependent diabetes mellitus and hypertension  Recheck carotid duplex in 18 months    PA-C [facility] Office: [phone]  Clinic MD: 

## 2020-01-11 ENCOUNTER — Other Ambulatory Visit: Payer: Self-pay | Admitting: *Deleted

## 2020-01-11 DIAGNOSIS — I6523 Occlusion and stenosis of bilateral carotid arteries: Secondary | ICD-10-CM

## 2020-02-11 ENCOUNTER — Telehealth: Payer: Self-pay | Admitting: Gastroenterology

## 2020-02-11 ENCOUNTER — Encounter: Payer: Self-pay | Admitting: Gastroenterology

## 2020-02-11 ENCOUNTER — Ambulatory Visit: Payer: Medicare Other | Admitting: Gastroenterology

## 2020-02-11 ENCOUNTER — Other Ambulatory Visit: Payer: Self-pay

## 2020-02-11 ENCOUNTER — Other Ambulatory Visit (HOSPITAL_COMMUNITY)
Admission: RE | Admit: 2020-02-11 | Discharge: 2020-02-11 | Disposition: A | Discharge status: Home / Self Care | Payer: Medicare Other | Source: Ambulatory Visit | Attending: Gastroenterology | Admitting: Gastroenterology

## 2020-02-11 ENCOUNTER — Ambulatory Visit (HOSPITAL_COMMUNITY)
Admission: RE | Admit: 2020-02-11 | Discharge: 2020-02-11 | Disposition: A | Discharge status: Home / Self Care | Payer: Medicare Other | Source: Ambulatory Visit | Attending: Gastroenterology | Admitting: Gastroenterology

## 2020-02-11 DIAGNOSIS — R071 Chest pain on breathing: Secondary | ICD-10-CM

## 2020-02-11 DIAGNOSIS — R0602 Shortness of breath: Secondary | ICD-10-CM | POA: Diagnosis not present

## 2020-02-11 LAB — CBC WITH DIFFERENTIAL/PLATELET
Abs Immature Granulocytes: 0.05 10*3/uL (ref 0.00–0.07)
Basophils Absolute: 0 10*3/uL (ref 0.0–0.1)
Basophils Relative: 0 %
Eosinophils Absolute: 0.2 10*3/uL (ref 0.0–0.5)
Eosinophils Relative: 2 %
HCT: 46.5 % — ABNORMAL HIGH (ref 36.0–46.0)
Hemoglobin: 14.6 g/dL (ref 12.0–15.0)
Immature Granulocytes: 1 %
Lymphocytes Relative: 30 %
Lymphs Abs: 3.2 10*3/uL (ref 0.7–4.0)
MCH: 29.4 pg (ref 26.0–34.0)
MCHC: 31.4 g/dL (ref 30.0–36.0)
MCV: 93.6 fL (ref 80.0–100.0)
Monocytes Absolute: 0.8 10*3/uL (ref 0.1–1.0)
Monocytes Relative: 8 %
Neutro Abs: 6.7 10*3/uL (ref 1.7–7.7)
Neutrophils Relative %: 59 %
Platelets: 239 10*3/uL (ref 150–400)
RBC: 4.97 MIL/uL (ref 3.87–5.11)
RDW: 13.5 % (ref 11.5–15.5)
WBC: 11 10*3/uL — ABNORMAL HIGH (ref 4.0–10.5)
nRBC: 0 % (ref 0.0–0.2)

## 2020-02-11 LAB — COMPREHENSIVE METABOLIC PANEL
ALT: 21 U/L (ref 0–44)
AST: 25 U/L (ref 15–41)
Albumin: 4.4 g/dL (ref 3.5–5.0)
Alkaline Phosphatase: 80 U/L (ref 38–126)
Anion gap: 11 (ref 5–15)
BUN: 15 mg/dL (ref 8–23)
CO2: 28 mmol/L (ref 22–32)
Calcium: 9.9 mg/dL (ref 8.9–10.3)
Chloride: 97 mmol/L — ABNORMAL LOW (ref 98–111)
Creatinine, Ser: 0.81 mg/dL (ref 0.44–1.00)
GFR calc Af Amer: >60 mL/min (ref >60)
GFR calc non Af Amer: >60 mL/min (ref >60)
Glucose, Bld: 226 mg/dL — ABNORMAL HIGH (ref 70–99)
Potassium: 4.6 mmol/L (ref 3.5–5.1)
Sodium: 136 mmol/L (ref 135–145)
Total Bilirubin: 0.6 mg/dL (ref 0.3–1.2)
Total Protein: 7.8 g/dL (ref 6.5–8.1)

## 2020-02-11 LAB — LIPASE, BLOOD: Lipase: 24 U/L (ref 11–51)

## 2020-02-11 MED ORDER — IOHEXOL 350 MG/ML SOLN
100.0000 mL | Freq: Once | INTRAVENOUS | Status: AC | PRN
  Administered 2020-02-11: 100 mL via INTRAVENOUS

## 2020-02-11 NOTE — Progress Notes (Signed)
CC'ED TO PCP 

## 2020-02-11 NOTE — Telephone Encounter (Signed)
Called lmom

## 2020-02-11 NOTE — Assessment & Plan Note (Signed)
SYMPTOMS NOT CONTROLLED AND NOT RELATED TO GI TRACT.  DIFFERENTIAL DIAGNOSIS INCLUDES: COSTOCHONDRITIS OR PE, LESS LIKELY METASTATIC LUNG DISEASE.  COMPLETE LABS: CMP, LIPASE, CBC AND CT SCAN TODAY. TAKE IBUPROFEN 400 MG  THREE TIMES A DAY FOR 7 DAYS. TAKE WITH FOOD OR MILK. USE TYLENOL IF NEEDED FOR ADDITIONAL PAIN RELIEF. APPLY HEATING PAD THREE TIMES A DAY TO CHEST FOR 7 DAYS. FOLLOW UP IN 3 MOS.

## 2020-02-11 NOTE — Telephone Encounter (Signed)
SEE TC APR 29.

## 2020-02-11 NOTE — Telephone Encounter (Signed)
Bambi from CT called to say that the patient's call report was now in epic.

## 2020-02-11 NOTE — Telephone Encounter (Addendum)
PLEASE CALL PT. SHE HAS AN ELEVATED BLOOD SUGAR AND WHITE COUNT. OTHERWISE THE PANCREAS, LIVER, AND KIDNEY FUNCTION ARE NORMAL. HER CT SCAN SHOWS NO ACUTE DISEASE. HER CHEST PAIN IS DUE TO COSTOCHONDRITIS.    TAKE IBUPROFEN 400 MG  THREE TIMES A DAY FOR 7 DAYS. TAKE WITH FOOD OR MILK. USE TYLENOL IF NEEDED FOR ADDITIONAL PAIN RELIEF. APPLY HEATING PAD THREE TIMES A DAY TO CHEST FOR 7 DAYS. FOLLOW UP IN 3 MOS.

## 2020-02-11 NOTE — Patient Instructions (Signed)
COMPLETE LABS AND CT SCAN TODAY.   TAKE IBUPROFEN 400 MG  THREE TIMES A DAY FOR 7 DAYS. TAKE WITH FOOD OR MILK.  USE TYLENOL IF NEEDED FOR ADDITIONAL PAIN RELIEF.  APPLY HEATING PAD THREE TIMES A DAY TO CHEST FOR 7 DAYS.   FOLLOW UP IN 3 MOS.

## 2020-02-11 NOTE — Telephone Encounter (Signed)
OV made °

## 2020-02-11 NOTE — Progress Notes (Signed)
Subjective:    Patient ID: [name], adult    DOB: August 15, 1951, 69 y.o.   MRN: [id number] Angela Beard, MD   HPI HAVING PAIN IN MIDDLE OF CHEST(SORENESS, WHEN MOVES GETS SHARP). SYMPTOMS STARTED ONE MONTH. GETTING WORSE. PAIN STARTS AND MOVES ALL THE WAY DOWN TO BOTTOM OF HER STOMACH. ASSOCIATED WITH SWELLING IN RIGHT LEG, HEADACHE,DIZZY, WEAKNESS, SOB(SEVERE, ALL THE TIME) LAST MAMMOGRAM: 2011. CAN LAY FLAT ON HER SIDE. REPORTS BLACK STOOL: BSC #3. OCCASIONALLY DYSPHAGIA: FOOD, 2X/WEEK. Sometimes trouble urinating. NO NEW MEDS OR OTC SUPPLEMENTS. APPT WITH DR. HILL MAY 10. CT CORONARY AUG 2020: NL, JAN 2021: MBS NL. NO INHALERS AND PT UNAWARE SHE HAS BRONCHITIS/ASTHMA. BMs: 1-2X/DAY(BSC #3). CHEST PAIN INCREASE WHEN SHE TAKES A DEEP BREATH. WHEN SHE TAKES A DEEP BREATHS SHE HAS PAIN IN RIGHT FLANK AND BACK.  PT DENIES WEIGHT LOSS, CHANGE IN APPETITE, FEVER, CHILLS, HEMATOCHEZIA, HEMATEMESIS, dysuria, HEMATURIA, nausea, vomiting, melena, diarrhea,  CHANGE IN BOWEL IN HABITS, constipation, problems swallowing, OR heartburn or indigestion.  Past Medical History:  Diagnosis Date  . Asthma   . Bronchitis   . Diabetes mellitus   . Hyperlipidemia   . Hypertension   . Low back pain   . Migraine   . Osteoarthritis   . Sciatica   . Varicose veins   . Vertigo    Past Surgical History:  Procedure Laterality Date  . ABDOMINAL HYSTERECTOMY    . BIOPSY  05/12/2019   Procedure: BIOPSY;  Surgeon: Danie Binder, MD;  [facility];  Service: Endoscopy;;  . CARPAL TUNNEL RELEASE Bilateral   . CESAREAN SECTION    . ESOPHAGOGASTRODUODENOSCOPY (EGD) WITH PROPOFOL N/A 05/12/2019   Procedure: ESOPHAGOGASTRODUODENOSCOPY (EGD) WITH PROPOFOL;  Surgeon: Danie Binder, MD;  [facility];  Service: Endoscopy;  Laterality: N/A;  12:00pm  . INGUINAL HERNIA REPAIR Left   . LUMBAR FUSION    . POSTERIOR LUMBAR FUSION 4 LEVEL N/A 03/04/2018   Procedure: Right transforaminal  lumbar interbody fusion L1-2, L2-3 Posterior fusion T10, T11, T12, L1, L2 with T 11, T12 pedicle screws, superior sublaminar hooks T10, local bone graft, allograft cancellous chips Vivigen;  Surgeon: Jessy Oto, MD;  Location: [place];  Service: Orthopedics;  Laterality: N/A;  . SAVORY DILATION N/A 05/12/2019   Procedure: SAVORY DILATION;  Surgeon: Danie Binder, MD;  [facility];  Service: Endoscopy;  Laterality: N/A;    No Known Allergies  Current Outpatient Medications  Medication Sig    . amLODipine (NORVASC) 10 MG tablet Take 10 mg by mouth daily.     Marland Kitchen aspirin 81 MG chewable tablet Chew 81 mg by mouth daily.    . insulin NPH-insulin regular (NOVOLIN 70/30) (70-30) 100 UNIT/ML injection Inject 67 Units into the skin 2 (two) times daily.     Marland Kitchen linaclotide (LINZESS) 72 MCG capsule Take 1 capsule (72 mcg total) by mouth daily before breakfast.    . losartan (COZAAR) 50 MG tablet Take 50 mg by mouth daily.    . metFORMIN (GLUCOPHAGE) 1000 MG tablet Take 1,000 mg by mouth at bedtime.    . methocarbamol (ROBAXIN) 500 MG tablet Take 1 tablet (500 mg total) by mouth every 8 (eight) hours as needed for muscle spasms.    .      . nitroGLYCERIN (NITROSTAT) 0.4 MG SL tablet Place 0.4 mg under the tongue every 5 (five) minutes as needed for chest pain. Dissolve one tablet under the tongue every  5 mintues as needed for chest pain.    [name] VERIO test strip See admin instructions.    . pantoprazole (PROTONIX) 40 MG tablet Take 40 mg by mouth daily.    Marland Kitchen RELION INSULIN SYRINGE 1ML/31G 31G X 5/16" 1 ML MISC USE 1 TWICE DAILY AS DIRECTED    . simvastatin (ZOCOR) 20 MG tablet Take 20 mg by mouth at bedtime.      . TRADJENTA 5 MG TABS tablet TAKE 1 TABLET BY MOUTH ONCE DAILY WITH BREAKFAST     Review of Systems PER HPI OTHERWISE ALL SYSTEMS ARE NEGATIVE.    Objective:   Physical Exam Vitals reviewed.  Constitutional:      General: She is not in acute distress.    Appearance: She  is well-developed.  HENT:     Head: Normocephalic and atraumatic.     Comments: MASK IN PLACE Eyes:     General: No scleral icterus.    Pupils: Pupils are equal, round, and reactive to light.  Cardiovascular:     Rate and Rhythm: Normal rate and regular rhythm.     Heart sounds: Normal heart sounds.  Pulmonary:     Effort: Pulmonary effort is normal. No respiratory distress.     Breath sounds: Normal breath sounds.     Comments: ABLE TO COMPLETE FULL SENTENCES Chest:     Chest wall: Tenderness (WITH AP AND LATERAL COMPRESSION) present.  Abdominal:     General: Bowel sounds are normal. There is no distension.     Palpations: Abdomen is soft.     Tenderness: There is no abdominal tenderness.  Musculoskeletal:     Cervical back: Normal range of motion and neck supple.     Right lower leg: Edema present.     Left lower leg: Edema present.     Comments: 1+ BILATERAL LOWER EXTREMITIES   Lymphadenopathy:     Cervical: No cervical adenopathy.  Skin:    Findings: No rash.  Neurological:     General: No focal deficit present.     Mental Status: She is alert and oriented to person, place, and time.  Psychiatric:        Mood and Affect: Mood normal.     Comments: FLAT AFFECT           Assessment & Plan:

## 2020-02-12 NOTE — Telephone Encounter (Signed)
Called verified name and dob Notified pt of results and providers recommendations. Also, notified her of ov with Baxter Hire on   7/28 @2p  Pt stated she understood and thanked me for that call

## 2020-02-12 NOTE — Telephone Encounter (Signed)
Called verified name and dob  Notified pt of results

## 2020-02-22 ENCOUNTER — Encounter (HOSPITAL_COMMUNITY): Payer: Self-pay | Admitting: Emergency Medicine

## 2020-02-22 ENCOUNTER — Emergency Department (HOSPITAL_COMMUNITY): Payer: Medicare Other

## 2020-02-22 ENCOUNTER — Other Ambulatory Visit: Payer: Self-pay

## 2020-02-22 ENCOUNTER — Observation Stay (HOSPITAL_COMMUNITY)
Admission: EM | Admit: 2020-02-22 | Discharge: 2020-02-23 | Disposition: A | Discharge status: Home / Self Care | Payer: Medicare Other | Attending: Internal Medicine | Admitting: Internal Medicine

## 2020-02-22 DIAGNOSIS — Z794 Long term (current) use of insulin: Secondary | ICD-10-CM | POA: Insufficient documentation

## 2020-02-22 DIAGNOSIS — K219 Gastro-esophageal reflux disease without esophagitis: Secondary | ICD-10-CM | POA: Diagnosis not present

## 2020-02-22 DIAGNOSIS — Z20822 Contact with and (suspected) exposure to covid-19: Secondary | ICD-10-CM | POA: Diagnosis not present

## 2020-02-22 DIAGNOSIS — R0789 Other chest pain: Secondary | ICD-10-CM | POA: Diagnosis not present

## 2020-02-22 DIAGNOSIS — I10 Essential (primary) hypertension: Secondary | ICD-10-CM | POA: Diagnosis not present

## 2020-02-22 DIAGNOSIS — Z7982 Long term (current) use of aspirin: Secondary | ICD-10-CM | POA: Insufficient documentation

## 2020-02-22 DIAGNOSIS — I509 Heart failure, unspecified: Secondary | ICD-10-CM | POA: Diagnosis not present

## 2020-02-22 DIAGNOSIS — R0600 Dyspnea, unspecified: Secondary | ICD-10-CM | POA: Diagnosis not present

## 2020-02-22 DIAGNOSIS — Z79899 Other long term (current) drug therapy: Secondary | ICD-10-CM | POA: Diagnosis not present

## 2020-02-22 DIAGNOSIS — E1165 Type 2 diabetes mellitus with hyperglycemia: Secondary | ICD-10-CM | POA: Diagnosis not present

## 2020-02-22 DIAGNOSIS — I110 Hypertensive heart disease with heart failure: Secondary | ICD-10-CM | POA: Insufficient documentation

## 2020-02-22 DIAGNOSIS — R0602 Shortness of breath: Secondary | ICD-10-CM | POA: Insufficient documentation

## 2020-02-22 DIAGNOSIS — E785 Hyperlipidemia, unspecified: Secondary | ICD-10-CM | POA: Diagnosis not present

## 2020-02-22 DIAGNOSIS — E119 Type 2 diabetes mellitus without complications: Secondary | ICD-10-CM

## 2020-02-22 DIAGNOSIS — E8881 Metabolic syndrome: Secondary | ICD-10-CM | POA: Insufficient documentation

## 2020-02-22 DIAGNOSIS — E669 Obesity, unspecified: Secondary | ICD-10-CM | POA: Diagnosis not present

## 2020-02-22 DIAGNOSIS — I6523 Occlusion and stenosis of bilateral carotid arteries: Secondary | ICD-10-CM | POA: Diagnosis not present

## 2020-02-22 DIAGNOSIS — R079 Chest pain, unspecified: Secondary | ICD-10-CM | POA: Diagnosis not present

## 2020-02-22 DIAGNOSIS — Z6831 Body mass index (BMI) 31.0-31.9, adult: Secondary | ICD-10-CM | POA: Insufficient documentation

## 2020-02-22 DIAGNOSIS — R0609 Other forms of dyspnea: Secondary | ICD-10-CM | POA: Insufficient documentation

## 2020-02-22 DIAGNOSIS — Z743 Need for continuous supervision: Secondary | ICD-10-CM | POA: Diagnosis not present

## 2020-02-22 LAB — BRAIN NATRIURETIC PEPTIDE: B Natriuretic Peptide: 40.9 pg/mL (ref 0.0–100.0)

## 2020-02-22 LAB — CBC
HCT: 45.3 % (ref 36.0–46.0)
Hemoglobin: 14.4 g/dL (ref 12.0–15.0)
MCH: 29.6 pg (ref 26.0–34.0)
MCHC: 31.8 g/dL (ref 30.0–36.0)
MCV: 93.2 fL (ref 80.0–100.0)
Platelets: 250 10*3/uL (ref 150–400)
RBC: 4.86 MIL/uL (ref 3.87–5.11)
RDW: 13.3 % (ref 11.5–15.5)
WBC: 11.5 10*3/uL — ABNORMAL HIGH (ref 4.0–10.5)
nRBC: 0 % (ref 0.0–0.2)

## 2020-02-22 LAB — CBG MONITORING, ED
Glucose-Capillary: 160 mg/dL — ABNORMAL HIGH (ref 70–99)
Glucose-Capillary: 95 mg/dL (ref 70–99)

## 2020-02-22 LAB — COMPREHENSIVE METABOLIC PANEL
ALT: 24 U/L (ref 0–44)
AST: 30 U/L (ref 15–41)
Albumin: 4.1 g/dL (ref 3.5–5.0)
Alkaline Phosphatase: 75 U/L (ref 38–126)
Anion gap: 15 (ref 5–15)
BUN: 14 mg/dL (ref 8–23)
CO2: 23 mmol/L (ref 22–32)
Calcium: 9.7 mg/dL (ref 8.9–10.3)
Chloride: 100 mmol/L (ref 98–111)
Creatinine, Ser: 0.84 mg/dL (ref 0.44–1.00)
GFR calc Af Amer: >60 mL/min (ref >60)
GFR calc non Af Amer: >60 mL/min (ref >60)
Glucose, Bld: 199 mg/dL — ABNORMAL HIGH (ref 70–99)
Potassium: 3.9 mmol/L (ref 3.5–5.1)
Sodium: 138 mmol/L (ref 135–145)
Total Bilirubin: 0.6 mg/dL (ref 0.3–1.2)
Total Protein: 7.3 g/dL (ref 6.5–8.1)

## 2020-02-22 LAB — HEMOGLOBIN A1C
Hgb A1c MFr Bld: 10.3 % — ABNORMAL HIGH (ref 4.8–5.6)
Mean Plasma Glucose: 248.91 mg/dL

## 2020-02-22 LAB — TROPONIN I (HIGH SENSITIVITY)
Troponin I (High Sensitivity): 9 ng/L (ref <18)
Troponin I (High Sensitivity): 13 ng/L (ref <18)

## 2020-02-22 LAB — SARS CORONAVIRUS 2 BY RT PCR (HOSPITAL ORDER, PERFORMED IN ~~LOC~~ HOSPITAL LAB): SARS Coronavirus 2: NEGATIVE

## 2020-02-22 LAB — TSH: TSH: 1.163 u[IU]/mL (ref 0.350–4.500)

## 2020-02-22 MED ORDER — HYDROCHLOROTHIAZIDE 12.5 MG PO CAPS
12.5000 mg | ORAL_CAPSULE | Freq: Every day | ORAL | Status: DC
  Administered 2020-02-22 – 2020-02-23 (×2): 12.5 mg via ORAL
  Filled 2020-02-22 (×2): qty 1

## 2020-02-22 MED ORDER — INSULIN ASPART 100 UNIT/ML ~~LOC~~ SOLN
0.0000 [IU] | Freq: Three times a day (TID) | SUBCUTANEOUS | Status: DC
  Administered 2020-02-22 – 2020-02-23 (×2): 2 [IU] via SUBCUTANEOUS

## 2020-02-22 MED ORDER — ACETAMINOPHEN 325 MG PO TABS: 650.0000 mg | ORAL_TABLET | ORAL | Status: DC | PRN

## 2020-02-22 MED ORDER — ONDANSETRON HCL 4 MG/2ML IJ SOLN: 4.0000 mg | Freq: Four times a day (QID) | INTRAMUSCULAR | Status: DC | PRN

## 2020-02-22 MED ORDER — AMLODIPINE BESYLATE 10 MG PO TABS
10.0000 mg | ORAL_TABLET | Freq: Every day | ORAL | Status: DC
  Administered 2020-02-23: 10 mg via ORAL
  Filled 2020-02-22: qty 1

## 2020-02-22 MED ORDER — ASPIRIN 81 MG PO CHEW
81.0000 mg | CHEWABLE_TABLET | Freq: Every day | ORAL | Status: DC
  Administered 2020-02-23: 81 mg via ORAL
  Filled 2020-02-22: qty 1

## 2020-02-22 MED ORDER — LINACLOTIDE 72 MCG PO CAPS
72.0000 ug | ORAL_CAPSULE | Freq: Every day | ORAL | Status: DC
  Administered 2020-02-23: 72 ug via ORAL
  Filled 2020-02-22: qty 1

## 2020-02-22 MED ORDER — SODIUM CHLORIDE 0.9 % IV SOLN: 250.0000 mL | INTRAVENOUS | Status: DC | PRN

## 2020-02-22 MED ORDER — ENOXAPARIN SODIUM 40 MG/0.4ML ~~LOC~~ SOLN
40.0000 mg | SUBCUTANEOUS | Status: DC
  Administered 2020-02-22: 40 mg via SUBCUTANEOUS
  Filled 2020-02-22: qty 0.4

## 2020-02-22 MED ORDER — LOSARTAN POTASSIUM 50 MG PO TABS
50.0000 mg | ORAL_TABLET | Freq: Every day | ORAL | Status: DC
  Administered 2020-02-23: 50 mg via ORAL
  Filled 2020-02-22: qty 1

## 2020-02-22 MED ORDER — SODIUM CHLORIDE 0.9% FLUSH: 3.0000 mL | INTRAVENOUS | Status: DC | PRN

## 2020-02-22 MED ORDER — ATORVASTATIN CALCIUM 10 MG PO TABS
20.0000 mg | ORAL_TABLET | Freq: Every day | ORAL | Status: DC
  Administered 2020-02-23: 20 mg via ORAL
  Filled 2020-02-22: qty 2

## 2020-02-22 MED ORDER — INSULIN ASPART PROT & ASPART (70-30 MIX) 100 UNIT/ML ~~LOC~~ SUSP
67.0000 [IU] | Freq: Two times a day (BID) | SUBCUTANEOUS | Status: DC
  Administered 2020-02-22: 19:00:00 67 [IU] via SUBCUTANEOUS
  Filled 2020-02-22: qty 10

## 2020-02-22 MED ORDER — SODIUM CHLORIDE 0.9% FLUSH
3.0000 mL | Freq: Two times a day (BID) | INTRAVENOUS | Status: DC
  Administered 2020-02-22 – 2020-02-23 (×2): 3 mL via INTRAVENOUS

## 2020-02-22 MED ORDER — HYDRALAZINE HCL 25 MG PO TABS: 25.0000 mg | ORAL_TABLET | Freq: Four times a day (QID) | ORAL | Status: DC | PRN

## 2020-02-22 MED ORDER — PANTOPRAZOLE SODIUM 40 MG PO TBEC
40.0000 mg | DELAYED_RELEASE_TABLET | Freq: Every day | ORAL | Status: DC
  Administered 2020-02-23: 09:00:00 40 mg via ORAL
  Filled 2020-02-22: qty 1

## 2020-02-22 NOTE — H&P (Signed)
History and Physical    [name] DOB: Dec 18, 1950 DOA: 02/22/2020  PCP: Mirna Mires, MD   Patient coming from: Home  I have personally briefly reviewed patient's old medical records in [facility]  Chief Complaint: Chest pain  HPI: [name] is a 69 y.o. female with medical history significant of hypertension hyperlipidemia, IDDM, GERD, with insulin resistance, presented with new onset of chest pain.  Symptoms started about 1 month ago, almost every morning she will experience about 1 to 2 hours of localized sharp-like chest pain, midsternal, nonradiating, severity 5-8/10, sometimes associated with short of breath.  She went to seek help with her GI, who she is following for GERD, and her GI doctor ordered a CTA on 01/22/2020, which showed negative for PE.  And her GI doctor started her on ibuprofen for putative costochondritis.  She been taking ibuprofen 3 times a day for more than a week, without significant improvement of her chest pain which still happens every morning.  She says sometimes deep breathing or moving shoulder to the right side can make the pain worse.  Denies any fever chills cough.  Meantime over last few weeks she started to feel more short of breath and increasing leg swelling.  She has been using 2 pillows instead of 1 for the last 2 weeks to improve her short of breath at night.  She denied any baseline lung problems. ED Course: Troponin II sets negative, EKG reassuring  Review of Systems: As per HPI otherwise 10 point review of systems negative.    Past Medical History:  Diagnosis Date  . Asthma   . Bronchitis   . Diabetes mellitus   . Hyperlipidemia   . Hypertension   . Low back pain   . Migraine   . Osteoarthritis   . Sciatica   . Varicose veins   . Vertigo     Past Surgical History:  Procedure Laterality Date  . ABDOMINAL HYSTERECTOMY    . BIOPSY  05/12/2019   Procedure: BIOPSY;  Surgeon: West Bali, MD;   [facility];  Service: Endoscopy;;  . CARPAL TUNNEL RELEASE Bilateral   . CESAREAN SECTION    . ESOPHAGOGASTRODUODENOSCOPY (EGD) WITH PROPOFOL N/A 05/12/2019   Procedure: ESOPHAGOGASTRODUODENOSCOPY (EGD) WITH PROPOFOL;  Surgeon: West Bali, MD;  [facility];  Service: Endoscopy;  Laterality: N/A;  12:00pm  . INGUINAL HERNIA REPAIR Left   . LUMBAR FUSION    . POSTERIOR LUMBAR FUSION 4 LEVEL N/A 03/04/2018   Procedure: Right transforaminal lumbar interbody fusion L1-2, L2-3 Posterior fusion T10, T11, T12, L1, L2 with T 11, T12 pedicle screws, superior sublaminar hooks T10, local bone graft, allograft cancellous chips Vivigen;  Surgeon: Kerrin Champagne, MD;  Location: [facility];  Service: Orthopedics;  Laterality: N/A;  . SAVORY DILATION N/A 05/12/2019   Procedure: SAVORY DILATION;  Surgeon: West Bali, MD;  [facility];  Service: Endoscopy;  Laterality: N/A;     reports that she has never smoked. She has never used smokeless tobacco. She reports that she does not drink alcohol or use drugs.  No Known Allergies  Family History  Problem Relation Age of Onset  . Lung cancer Mother   . Stroke Father   . Heart failure Sister   . Heart failure Sister   . Colon cancer Neg Hx   . Gastric cancer Neg Hx   . Esophageal cancer Neg Hx      Prior  to Admission medications   Medication Sig Start Date End Date Taking? Authorizing Provider  amLODipine (NORVASC) 10 MG tablet Take 10 mg by mouth daily.    Yes [provider]  aspirin 81 MG chewable tablet Chew 81 mg by mouth daily.   Yes [provider]  insulin NPH-insulin regular (NOVOLIN 70/30) (70-30) 100 UNIT/ML injection Inject 67 Units into the skin 2 (two) times daily.    Yes [provider]  linaclotide (LINZESS) 72 MCG capsule Take 1 capsule (72 mcg total) by mouth daily before breakfast. 09/16/19  Yes Fields, Sandi L, MD  losartan (COZAAR) 50 MG tablet Take 50 mg by mouth daily.  04/23/18  Yes [provider]  metFORMIN (GLUCOPHAGE) 1000 MG tablet Take 1,000 mg by mouth at bedtime.   Yes [provider]  nitroGLYCERIN (NITROSTAT) 0.4 MG SL tablet Place 0.4 mg under the tongue every 5 (five) minutes as needed for chest pain. Dissolve one tablet under the tongue every 5 mintues as needed for chest pain.   Yes [provider]  pantoprazole (PROTONIX) 40 MG tablet Take 40 mg by mouth daily.   Yes [provider]  simvastatin (ZOCOR) 20 MG tablet Take 20 mg by mouth at bedtime.     Yes [provider]  TRADJENTA 5 MG TABS tablet Take 5 mg by mouth daily with breakfast.  02/24/19  Yes [provider]  methocarbamol (ROBAXIN) 500 MG tablet Take 1 tablet (500 mg total) by mouth every 8 (eight) hours as needed for muscle spasms. Patient not taking: Reported on 02/22/2020 04/29/19   Jessy Oto, MD  metoprolol tartrate (LOPRESSOR) 100 MG tablet Take 100 mg 2 hours before cardiac CT Patient not taking: Reported on 02/22/2020 04/10/19   Herminio Commons, MD  [facility] VERIO test strip See admin instructions. 03/25/18   [provider]  [place] 1ML/31G 31G X 5/16" 1 ML MISC USE 1 TWICE DAILY AS DIRECTED 04/16/18   [provider]    Physical Exam: Vitals:   02/22/20 1600 02/22/20 1615 02/22/20 1645 02/22/20 1700  BP: (!) 164/72 129/62 (!) 151/70 130/66  Pulse: 87 78 80 74  Resp: 18 19 16 18   Temp:      TempSrc:      SpO2: 92% 94% 91% 93%  Weight:        Constitutional: NAD, calm, comfortable Vitals:   02/22/20 1600 02/22/20 1615 02/22/20 1645 02/22/20 1700  BP: (!) 164/72 129/62 (!) 151/70 130/66  Pulse: 87 78 80 74  Resp: 18 19 16 18   Temp:      TempSrc:      SpO2: 92% 94% 91% 93%  Weight:       Eyes: PERRL, lids and conjunctivae normal ENMT: Mucous membranes are moist. Posterior pharynx clear of any exudate or lesions.Normal dentition.  Neck: normal, supple, no masses, no  thyromegaly Respiratory: clear to auscultation bilaterally, no wheezing, no crackles. Normal respiratory effort. No accessory muscle use.  Cardiovascular: Regular rate and rhythm, no murmurs / rubs / gallops. No extremity edema. 2+ pedal pulses. No carotid bruits.  Abdomen: no tenderness, no masses palpated. No hepatosplenomegaly. Bowel sounds positive.  Musculoskeletal: no clubbing / cyanosis. No joint deformity upper and lower extremities. Good ROM, no contractures. Normal muscle tone.  Tenderness on palpation of the middle of the chest Skin: no rashes, lesions, ulcers. No induration Neurologic: CN 2-12 grossly intact. Sensation intact, DTR normal. Strength 5/5 in all 4.  Psychiatric: Normal judgment  and insight. Alert and oriented x 3. Normal mood.     Labs on Admission: I have personally reviewed following labs and imaging studies  CBC: Recent Labs  Lab 02/22/20 1158  WBC 11.5*  HGB 14.4  HCT 45.3  MCV 93.2  PLT 250   Basic Metabolic Panel: Recent Labs  Lab 02/22/20 1158  NA 138  K 3.9  CL 100  CO2 23  GLUCOSE 199*  BUN 14  CREATININE 0.84  CALCIUM 9.7   GFR: Estimated Creatinine Clearance: 68.7 mL/min (by C-G formula based on SCr of 0.84 mg/dL). Liver Function Tests: Recent Labs  Lab 02/22/20 1158  AST 30  ALT 24  ALKPHOS 75  BILITOT 0.6  PROT 7.3  ALBUMIN 4.1   No results for input(s): LIPASE, AMYLASE in the last 168 hours. No results for input(s): AMMONIA in the last 168 hours. Coagulation Profile: No results for input(s): INR, PROTIME in the last 168 hours. Cardiac Enzymes: No results for input(s): CKTOTAL, CKMB, CKMBINDEX, TROPONINI in the last 168 hours. BNP (last 3 results) No results for input(s): PROBNP in the last 8760 hours. HbA1C: No results for input(s): HGBA1C in the last 72 hours. CBG: No results for input(s): GLUCAP in the last 168 hours. Lipid Profile: No results for input(s): CHOL, HDL, LDLCALC, TRIG, CHOLHDL, LDLDIRECT in the last  72 hours. Thyroid Function Tests: No results for input(s): TSH, T4TOTAL, FREET4, T3FREE, THYROIDAB in the last 72 hours. Anemia Panel: No results for input(s): VITAMINB12, FOLATE, FERRITIN, TIBC, IRON, RETICCTPCT in the last 72 hours. Urine analysis:    Component Value Date/Time   COLORURINE YELLOW 02/28/2018 1205   APPEARANCEUR CLEAR 02/28/2018 1205   LABSPEC 1.006 02/28/2018 1205   PHURINE 5.0 02/28/2018 1205   GLUCOSEU NEGATIVE 02/28/2018 1205   HGBUR NEGATIVE 02/28/2018 1205   BILIRUBINUR NEGATIVE 02/28/2018 1205   KETONESUR NEGATIVE 02/28/2018 1205   PROTEINUR NEGATIVE 02/28/2018 1205   UROBILINOGEN 0.2 06/06/2010 0630   NITRITE NEGATIVE 02/28/2018 1205   LEUKOCYTESUR NEGATIVE 02/28/2018 1205    Radiological Exams on Admission: DG Chest Port 1 View  Result Date: 02/22/2020 CLINICAL DATA:  Chest pain, shortness of breath EXAM: PORTABLE CHEST 1 VIEW COMPARISON:  02/28/2018, 02/11/2020 FINDINGS: The heart size and mediastinal contours are within normal limits. Persistent elevation of the right hemidiaphragm. Atherosclerotic calcification of the aortic knob. Both lungs are clear. The visualized skeletal structures are unremarkable. IMPRESSION: No active disease. Electronically Signed   By: Duanne Guess D.O.   On: 02/22/2020 12:36    EKG: Independently reviewed.   Assessment/Plan Active Problems:   Chest pain at rest   Chest pain  Atypical chest pain rule out ACS -Her chest pain has features of costochondritis,  -Some features concerns for coronary disease, especially given her new onset of dyspnea and increasing leg swelling.  Discussed with on-call cardiologist, will evaluate the patient.  Her PCP outpatient already ordered a coronary artery CT.  May need stress test.  New onset CHF, preferred diastolic -As above, may need to rule out coronary disease at this point given significant risk factors she has -With signs of fluid overload, received one dose of IV lasix in ED,  re-evaluate in AM for more diuresis -Cardiology consult  Uncontrolled hypertension -She is on amlodipine, metoprolol and losartan, will add low-dose of hydrochlorothiazide and as needed hydralazine  IDDM with insulin resistance -Continue her 70/30, and add a sliding scale -Hold her p.o. DM meds for now  GERD PPI  DVT prophylaxis: Lovenox Code  Status: Full Family Communication: Sister at bedside Disposition Plan: Tele Obs Consults called: Cardiology Admission status: Telemetry obs   Emeline General MD [facility] Pager [phone]    02/22/2020, 5:17 PM

## 2020-02-22 NOTE — ED Notes (Signed)
Pt ate 100% of her dinner 

## 2020-02-22 NOTE — ED Triage Notes (Signed)
Pt in from doc's office via [facility] w/central cp x 1 mo, worsened sob x same. Per doc's office, pt had normal CT chest 1 mo ago. Pt has 5/10 sharp cp on arrival, was given 324mg  ASA and 1 NTG en route, no relief to pain. HR 100 en route, and sats went down to 88% when walking, placed on 2LNC PTA. A&ox4, denies any n/v or cough

## 2020-02-22 NOTE — ED Notes (Signed)
Pt ambulated with steady gait however pt was SOB and O2 on RA was 88%. After pt sat down O2 on RA went up to 92%. Pt was placed on 2 liters nasal canula.

## 2020-02-22 NOTE — ED Provider Notes (Signed)
[name] EMERGENCY DEPARTMENT Provider Note   CSN: [id number] Arrival date & time: 02/22/20  1149     History Chief Complaint  Patient presents with  . Chest Pain    [name] is a 69 y.o. female who presents emergency department with chief complaint of chest pain or shortness of breath.  Story is gathered from the patient, review of EMR, EMS at bedside, and handwritten note from her primary care physician.  She was seen today at her primary care physician's office and was sent over for evaluation.  Her chest pain has been going on for about a month and a half.  She had a CT angiogram of the chest last week to rule out pulmonary embolus and it was negative.  Patient states that it is on the right side and radiates to her right shoulder.  It is constant but it is worse when she exerts herself.  She has had this difficulty worsening dyspnea at rest which is also worse with exertion.  She has noticed swelling in her legs and abdomen which are new since last week.  She denies PND but has 2 pillow orthopnea which is chronic.  She has not had to increase her pillows.  She has no known history of heart disease and has no current cardiologist.  She does have a listed history of carotid artery disease.  She denies a history of smoking.  She has diabetes, hypertension, hyperlipidemia.  She denies fevers, chills, cough.  She denies a history of heartburn and does not take medication for gastroesophageal reflux.  At today's doctors visit her oxygen saturations were in the low 90s on room air.  When she went to the bathroom and walked back she was found to be 87% on room air.  EMS reports that she was given nitroglycerin and aspirin prior to arrival without change in her pain.  They report an EKG without signs of ischemia  .     HPI: A 69 year old patient with a history of peripheral artery disease, treated diabetes, hypertension, hypercholesterolemia and obesity presents for  evaluation of chest pain. Initial onset of pain was more than 6 hours ago. The patient's chest pain is described as heaviness/pressure/tightness and is worse with exertion. The patient complains of nausea. The patient's chest pain is not middle- or left-sided, is not well-localized, is not sharp and does not radiate to the arms/jaw/neck. The patient denies diaphoresis. The patient has a family history of coronary artery disease in a first-degree relative with onset less than age 29. The patient has no history of stroke and has not smoked in the past 90 days.   Past Medical History:  Diagnosis Date  . Asthma   . Bronchitis   . Diabetes mellitus   . Hyperlipidemia   . Hypertension   . Low back pain   . Migraine   . Osteoarthritis   . Sciatica   . Varicose veins   . Vertigo     Patient Active Problem List   Diagnosis Date Noted  . Carotid artery stenosis 01/07/2020  . Constipation 09/03/2019  . Dysphagia 08/11/2018  . Leg swelling 08/11/2018  . Bilateral carotid bruits 08/11/2018  . Abnormal EKG 08/06/2018  . Acute blood loss as cause of postoperative anemia 03/07/2018    Class: Acute  . Spinal stenosis, lumbar region with neurogenic claudication 03/04/2018    Class: Chronic  . Multilevel degenerative disc disease 03/04/2018    Class: Chronic  . Forestier's disease of  thoracolumbar region 03/04/2018    Class: Chronic  . Fusion of spine of thoracolumbar region 03/04/2018  . Pain in right hip 03/18/2017  . Stiffness of joint, not elsewhere classified, other specified site 08/04/2013  . Leg weakness, bilateral 08/04/2013  . CHEST PAIN UNSPECIFIED 05/10/2009  . BENIGN POSITIONAL VERTIGO 07/30/2007  . VARICOSE VEINS, LOWER EXTREMITIES 07/30/2007  . SCIATICA, CHRONIC 07/30/2007  . DIABETES MELLITUS, TYPE II 11/18/2006  . Hyperlipidemia 11/18/2006  . MIGRAINE HEADACHE 11/18/2006  . LESION, SCIATIC NERVE 11/18/2006  . Essential hypertension 11/18/2006  . HYPOTENSION, ORTHOSTATIC  11/18/2006  . BRONCHITIS NOS 11/18/2006  . Other emphysema (HCC) 11/18/2006  . ASTHMA 11/18/2006  . OSTEOARTHRITIS 11/18/2006  . LOW BACK PAIN 11/18/2006  . VERTIGO 11/18/2006    Past Surgical History:  Procedure Laterality Date  . ABDOMINAL HYSTERECTOMY    . BIOPSY  05/12/2019   Procedure: BIOPSY;  Surgeon: West BaliFields, Sandi L, MD;  [facility];  Service: Endoscopy;;  . CARPAL TUNNEL RELEASE Bilateral   . CESAREAN SECTION    . ESOPHAGOGASTRODUODENOSCOPY (EGD) WITH PROPOFOL N/A 05/12/2019   Procedure: ESOPHAGOGASTRODUODENOSCOPY (EGD) WITH PROPOFOL;  Surgeon: West BaliFields, Sandi L, MD;  [facility];  Service: Endoscopy;  Laterality: N/A;  12:00pm  . INGUINAL HERNIA REPAIR Left   . LUMBAR FUSION    . POSTERIOR LUMBAR FUSION 4 LEVEL N/A 03/04/2018   Procedure: Right transforaminal lumbar interbody fusion L1-2, L2-3 Posterior fusion T10, T11, T12, L1, L2 with T 11, T12 pedicle screws, superior sublaminar hooks T10, local bone graft, allograft cancellous chips Vivigen;  Surgeon: Kerrin ChampagneNitka, James E, MD;  Location: [facility];  Service: Orthopedics;  Laterality: N/A;  . SAVORY DILATION N/A 05/12/2019   Procedure: SAVORY DILATION;  Surgeon: West BaliFields, Sandi L, MD;  [facility];  Service: Endoscopy;  Laterality: N/A;     OB History   No obstetric history on file.     Family History  Problem Relation Age of Onset  . Lung cancer Mother   . Stroke Father   . Heart failure Sister   . Heart failure Sister   . Colon cancer Neg Hx   . Gastric cancer Neg Hx   . Esophageal cancer Neg Hx     Social History   Tobacco Use  . Smoking status: Never Smoker  . Smokeless tobacco: Never Used  Substance Use Topics  . Alcohol use: No    Alcohol/week: 0.0 standard drinks  . Drug use: No    Home Medications Prior to Admission medications   Medication Sig Start Date End Date Taking? Authorizing Provider  amLODipine (NORVASC) 10 MG tablet Take 10 mg by mouth daily.    Yes [provider]  aspirin 81 MG chewable tablet Chew 81 mg by mouth daily.   Yes [provider]  insulin NPH-insulin regular (NOVOLIN 70/30) (70-30) 100 UNIT/ML injection Inject 67 Units into the skin 2 (two) times daily.    Yes [provider]  linaclotide (LINZESS) 72 MCG capsule Take 1 capsule (72 mcg total) by mouth daily before breakfast. 09/16/19  Yes Fields, Sandi L, MD  losartan (COZAAR) 50 MG tablet Take 50 mg by mouth daily. 04/23/18  Yes [provider]  metFORMIN (GLUCOPHAGE) 1000 MG tablet Take 1,000 mg by mouth at bedtime.   Yes [provider]  nitroGLYCERIN (NITROSTAT) 0.4 MG SL tablet Place 0.4 mg under the tongue every 5 (five) minutes as needed for chest pain. Dissolve one tablet under the tongue every  5 mintues as needed for chest pain.   Yes [provider]  pantoprazole (PROTONIX) 40 MG tablet Take 40 mg by mouth daily.   Yes [provider]  simvastatin (ZOCOR) 20 MG tablet Take 20 mg by mouth at bedtime.     Yes [provider]  TRADJENTA 5 MG TABS tablet Take 5 mg by mouth daily with breakfast.  02/24/19  Yes [provider]  methocarbamol (ROBAXIN) 500 MG tablet Take 1 tablet (500 mg total) by mouth every 8 (eight) hours as needed for muscle spasms. Patient not taking: Reported on 02/22/2020 04/29/19   Jessy Oto, MD  metoprolol tartrate (LOPRESSOR) 100 MG tablet Take 100 mg 2 hours before cardiac CT Patient not taking: Reported on 02/22/2020 04/10/19   Herminio Commons, MD  [facility] VERIO test strip See admin instructions. 03/25/18   [provider]  [place] 1ML/31G 31G X 5/16" 1 ML MISC USE 1 TWICE DAILY AS DIRECTED 04/16/18   [provider]    Allergies    Patient has no known allergies.  Review of Systems   Review of Systems Ten systems reviewed and are negative for acute change, except as noted in the HPI.   Physical Exam Updated Vital Signs BP (!) 144/72    Pulse 90   Temp 98 F (36.7 C) (Oral)   Resp 14   Wt 86.6 kg   SpO2 96%   BMI 31.77 kg/m   Physical Exam Vitals and nursing note reviewed.  Constitutional:      General: She is not in acute distress.    Appearance: She is well-developed. She is not diaphoretic.  HENT:     Head: Normocephalic and atraumatic.  Eyes:     General: No scleral icterus.    Conjunctiva/sclera: Conjunctivae normal.  Cardiovascular:     Rate and Rhythm: Regular rhythm. Tachycardia present.     Heart sounds: Normal heart sounds. No murmur. No friction rub. No gallop.   Pulmonary:     Effort: No respiratory distress.     Breath sounds: No decreased breath sounds or wheezing.     Comments: Breathing is mild labored with interrupted sentences Abdominal:     General: Bowel sounds are normal. There is no distension.     Palpations: Abdomen is soft. There is no mass.     Tenderness: There is no abdominal tenderness. There is no guarding.  Musculoskeletal:     Cervical back: Normal range of motion.     Right lower leg: Edema present.     Left lower leg: Edema present.     Comments: 1+  Skin:    General: Skin is warm and dry.  Neurological:     Mental Status: She is alert and oriented to person, place, and time.  Psychiatric:        Behavior: Behavior normal.     ED Results / Procedures / Treatments   Labs (all labs ordered are listed, but only abnormal results are displayed) Labs Reviewed  CBC - Abnormal; Notable for the following components:      Result Value   WBC 11.5 (*)    All other components within normal limits  COMPREHENSIVE METABOLIC PANEL - Abnormal; Notable for the following components:   Glucose, Bld 199 (*)    All other components within normal limits  BRAIN NATRIURETIC PEPTIDE  TROPONIN I (HIGH SENSITIVITY)  TROPONIN I (HIGH SENSITIVITY)    EKG EKG Interpretation  Date/Time:  Monday Feb 22 2020 11:52:47 EDT Ventricular Rate:  91 PR Interval:    QRS Duration: 79 QT  Interval:  341 QTC Calculation: 420 R Axis:   -12 Text Interpretation: Sinus rhythm Abnormal R-wave progression, late transition no acute ST/T changes similar to Aug 2011 Confirmed by Pricilla Loveless [id number]) on 02/22/2020 1:28:30 PM   Radiology DG Chest Port 1 View  Result Date: 02/22/2020 CLINICAL DATA:  Chest pain, shortness of breath EXAM: PORTABLE CHEST 1 VIEW COMPARISON:  02/28/2018, 02/11/2020 FINDINGS: The heart size and mediastinal contours are within normal limits. Persistent elevation of the right hemidiaphragm. Atherosclerotic calcification of the aortic knob. Both lungs are clear. The visualized skeletal structures are unremarkable. IMPRESSION: No active disease. Electronically Signed   By: Duanne Guess D.O.   On: 02/22/2020 12:36    Procedures Procedures (including critical care time)  Medications Ordered in ED Medications - No data to display  ED Course  I have reviewed the triage vital signs and the nursing notes.  Pertinent labs & imaging results that were available during my care of the patient were reviewed by me and considered in my medical decision making (see chart for details).    MDM Rules/Calculators/A&P HEAR Score: 6                    JG:OTLXB pain VS: BP (!) 144/72   Pulse 90   Temp 98 F (36.7 C) (Oral)   Resp 14   Wt 86.6 kg   SpO2 96%   BMI 31.77 kg/m  WI:OMBTDHR is gathered by patient  and emr, ems, written note from PCP. Previous records obtained and reviewed. DDX:The patient's complaint of sob involves an extensive number of diagnostic and treatment options, and is a complaint that carries with it a high risk of complications, morbidity, and potential mortality. Given the large differential diagnosis, medical decision making is of high complexity. The emergent differential diagnosis for shortness of breath includes, but is not limited to, Pulmonary edema, bronchoconstriction, Pneumonia, Pulmonary embolism, Pneumotherax/ Hemothorax, Dysrythmia,  ACS.   Labs: I ordered reviewed and interpreted labs which includes BC which is mild leukocytosis, no other abnormalities, CMP shows elevated blood glucose of 199 without other abnormalities. Troponin is within normal limits at 9. BNP is 40.9 however given her clinical presentation I question if this is falsely low in the setting of obesity.  Imaging: I ordered and reviewed images which included one-view portable chest x-ray. I independently visualized and interpreted all imaging. There are no acute, significant findings on today's images. EKG: sinus rhythm at a rate of 91 without evidence of acute ischemia Consults:Dr. Chipper Herb CBU:LAGTXMI with cc has of chest pain and shortness of breath.  Her heart score is 6.  Question whether patient has a falsely low BNP.  Chest x-ray is without abnormality.  She is obviously dyspneic.  Oxygen saturations in the low 90s.  Patient will be admitted to the hospitalist.  She may need a new echocardiogram.  Last in 2019.  Feel she needs a repeat CT angiogram. Patient disposition: admit  The patient appears reasonably stabilized for admission considering the current resources, flow, and capabilities available in the ED at this time, and I doubt any other [facility] requiring further screening and/or treatment in the ED prior to admission.         Final Clinical Impression(s) / ED Diagnoses Final diagnoses:  Chest pain with moderate risk for cardiac etiology  Shortness of breath    Rx / DC Orders ED Discharge  Orders    None       Arthor Captain, PA-C 02/22/20 1507    Pricilla Loveless, MD 02/23/20 [phone]

## 2020-02-22 NOTE — ED Notes (Signed)
[name] son [phone] looking for an update on pt

## 2020-02-22 NOTE — ED Notes (Signed)
Dinner ordered 

## 2020-02-22 NOTE — Consult Note (Signed)
CARDIOLOGY CONSULT NOTE       Patient ID: [name] MRN: [id number] DOB/AGE: 1951/04/24 69 y.o.  Admit date: 02/22/2020 Referring Physician: Chipper Herb Primary Physician: Mirna Mires, MD Primary Cardiologist: Purvis Sheffield Reason for Consultation: Chest pain and dyspnea  Active Problems:   Chest pain at rest   Chest pain   HPI:  69 y.o. seen in ER for dyspnea and chest pain. She is sedentary at home and does not work. She has noticed over last week more dyspnea Has mild chronic LE edema due to dietary indiscretion and obesity Echo done for edema November 2019 showed normal EF 55-60% indeterminate diastolic function and no significant valve disease She has CRF;s including PVD with moderate left ICA stenosis followed by VVS, HTN and HLD.  Chest pain is more with exertion and recumbence but right sided  CT chest done 02/11/20 was negative for PE Cardiac CT done 06/11/19 showed a calcium score of 0 and no CAD.  There was mention of a small secundum ASD. This was not commented on with her echo done 08/15/18 She denies cough , fever CXR is normal with NAD and BNP normal at 40.9 Hct 45.3 and troponin negative  ROS All other systems reviewed and negative except as noted above  Past Medical History:  Diagnosis Date  . Asthma   . Bronchitis   . Diabetes mellitus   . Hyperlipidemia   . Hypertension   . Low back pain   . Migraine   . Osteoarthritis   . Sciatica   . Varicose veins   . Vertigo     Family History  Problem Relation Age of Onset  . Lung cancer Mother   . Stroke Father   . Heart failure Sister   . Heart failure Sister   . Colon cancer Neg Hx   . Gastric cancer Neg Hx   . Esophageal cancer Neg Hx     Social History   Socioeconomic History  . Marital status: Single    Spouse name: Not on file  . Number of children: Not on file  . Years of education: 12th   . Highest education level: Not on file  Occupational History  . Occupation: Disabled    Employer:  UNEMPLOYED  Tobacco Use  . Smoking status: Never Smoker  . Smokeless tobacco: Never Used  Substance and Sexual Activity  . Alcohol use: No    Alcohol/week: 0.0 standard drinks  . Drug use: No  . Sexual activity: Yes    Birth control/protection: Surgical  Other Topics Concern  . Not on file  Social History Narrative   No caffeine use. 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 2 CHILDREN: 1 IN [place], 1 IN MILITARY CURRENTLY STATIONED IN [place].   Social Determinants of Health   Financial Resource Strain:   . Difficulty of Paying Living Expenses:   Food Insecurity:   . Worried About Programme researcher, broadcasting/film/video in the Last Year:   . Barista in the Last Year:   Transportation Needs:   . Freight forwarder (Medical):   Marland Kitchen Lack of Transportation (Non-Medical):   Physical Activity:   . Days of Exercise per Week:   . Minutes of Exercise per Session:   Stress:   . Feeling of Stress :   Social Connections:   . Frequency of Communication with Friends and Family:   . Frequency of Social Gatherings with Friends and Family:   . Attends Religious Services:   . Active  Member of Clubs or Organizations:   . Attends Banker Meetings:   Marland Kitchen Marital Status:   Intimate Partner Violence:   . Fear of Current or Ex-Partner:   . Emotionally Abused:   Marland Kitchen Physically Abused:   . Sexually Abused:     Past Surgical History:  Procedure Laterality Date  . ABDOMINAL HYSTERECTOMY    . BIOPSY  05/12/2019   Procedure: BIOPSY;  Surgeon: West Bali, MD;  Location: [facility];  Service: Endoscopy;;  . CARPAL TUNNEL RELEASE Bilateral   . CESAREAN SECTION    . ESOPHAGOGASTRODUODENOSCOPY (EGD) WITH PROPOFOL N/A 05/12/2019   Procedure: ESOPHAGOGASTRODUODENOSCOPY (EGD) WITH PROPOFOL;  Surgeon: West Bali, MD;  Location: [facility];  Service: Endoscopy;  Laterality: N/A;  12:00pm  . INGUINAL HERNIA REPAIR Left   . LUMBAR FUSION    . POSTERIOR  LUMBAR FUSION 4 LEVEL N/A 03/04/2018   Procedure: Right transforaminal lumbar interbody fusion L1-2, L2-3 Posterior fusion T10, T11, T12, L1, L2 with T 11, T12 pedicle screws, superior sublaminar hooks T10, local bone graft, allograft cancellous chips Vivigen;  Surgeon: Kerrin Champagne, MD;  Location: [facility];  Service: Orthopedics;  Laterality: N/A;  . SAVORY DILATION N/A 05/12/2019   Procedure: SAVORY DILATION;  Surgeon: West Bali, MD;  Location: [facility];  Service: Endoscopy;  Laterality: N/A;      Current Facility-Administered Medications:  .  0.9 %  sodium chloride infusion, 250 mL, Intravenous, PRN, Mikey College T, MD .  acetaminophen (TYLENOL) tablet 650 mg, 650 mg, Oral, Q4H PRN, Emeline General, MD .  Melene Muller ON 02/23/2020] amLODipine (NORVASC) tablet 10 mg, 10 mg, Oral, Daily, Mikey College T, MD .  Melene Muller ON 02/23/2020] aspirin chewable tablet 81 mg, 81 mg, Oral, Daily, Mikey College T, MD .  atorvastatin (LIPITOR) tablet 20 mg, 20 mg, Oral, Daily, Zhang, Ping T, MD .  enoxaparin (LOVENOX) injection 40 mg, 40 mg, Subcutaneous, Q24H, Zhang, Ping T, MD .  hydrALAZINE (APRESOLINE) tablet 25 mg, 25 mg, Oral, Q6H PRN, Mikey College T, MD .  hydrochlorothiazide (MICROZIDE) capsule 12.5 mg, 12.5 mg, Oral, Daily, Zhang, Ping T, MD .  insulin aspart (novoLOG) injection 0-9 Units, 0-9 Units, Subcutaneous, TID WC, Zhang, Ping T, MD .  insulin aspart protamine- aspart (NOVOLOG MIX 70/30) injection 67 Units, 67 Units, Subcutaneous, BID WC, Mikey College T, MD .  Melene Muller ON 02/23/2020] linaclotide (LINZESS) capsule 72 mcg, 72 mcg, Oral, QAC breakfast, Mikey College T, MD .  Melene Muller ON 02/23/2020] losartan (COZAAR) tablet 50 mg, 50 mg, Oral, Daily, Mikey College T, MD .  ondansetron [facility]) injection 4 mg, 4 mg, Intravenous, Q6H PRN, Mikey College T, MD .  Melene Muller ON 02/23/2020] pantoprazole (PROTONIX) EC tablet 40 mg, 40 mg, Oral, Daily, Zhang, Ping T, MD .  sodium chloride flush (NS) 0.9 % injection 3 mL, 3 mL,  Intravenous, Q12H, Zhang, Ping T, MD .  sodium chloride flush (NS) 0.9 % injection 3 mL, 3 mL, Intravenous, PRN, Mikey College T, MD  Current Outpatient Medications:  .  amLODipine (NORVASC) 10 MG tablet, Take 10 mg by mouth daily. , Disp: , Rfl:  .  aspirin 81 MG chewable tablet, Chew 81 mg by mouth daily., Disp: , Rfl:  .  insulin NPH-insulin regular (NOVOLIN 70/30) (70-30) 100 UNIT/ML injection, Inject 67 Units into the skin 2 (two) times daily. , Disp: , Rfl:  .  linaclotide (LINZESS) 72 MCG capsule, Take 1 capsule (72 mcg total)  by mouth daily before breakfast., Disp: 30 capsule, Rfl: 11 .  losartan (COZAAR) 50 MG tablet, Take 50 mg by mouth daily., Disp: , Rfl: 4 .  metFORMIN (GLUCOPHAGE) 1000 MG tablet, Take 1,000 mg by mouth at bedtime., Disp: , Rfl:  .  nitroGLYCERIN (NITROSTAT) 0.4 MG SL tablet, Place 0.4 mg under the tongue every 5 (five) minutes as needed for chest pain. Dissolve one tablet under the tongue every 5 mintues as needed for chest pain., Disp: , Rfl:  .  pantoprazole (PROTONIX) 40 MG tablet, Take 40 mg by mouth daily., Disp: , Rfl:  .  simvastatin (ZOCOR) 20 MG tablet, Take 20 mg by mouth at bedtime.  , Disp: , Rfl:  .  TRADJENTA 5 MG TABS tablet, Take 5 mg by mouth daily with breakfast. , Disp: , Rfl:  .  methocarbamol (ROBAXIN) 500 MG tablet, Take 1 tablet (500 mg total) by mouth every 8 (eight) hours as needed for muscle spasms. (Patient not taking: Reported on 02/22/2020), Disp: 270 tablet, Rfl: 3 .  metoprolol tartrate (LOPRESSOR) 100 MG tablet, Take 100 mg 2 hours before cardiac CT (Patient not taking: Reported on 02/22/2020), Disp: 1 tablet, Rfl: 0 .  ONETOUCH VERIO test strip, See admin instructions., Disp: , Rfl: 2 .  RELION INSULIN SYRINGE 1ML/31G 31G X 5/16" 1 ML MISC, USE 1 TWICE DAILY AS DIRECTED, Disp: , Rfl: 2 . [START ON 02/23/2020] amLODipine  10 mg Oral Daily  . [START ON 02/23/2020] aspirin  81 mg Oral Daily  . atorvastatin  20 mg Oral Daily  . enoxaparin  (LOVENOX) injection  40 mg Subcutaneous Q24H  . hydrochlorothiazide  12.5 mg Oral Daily  . insulin aspart  0-9 Units Subcutaneous TID WC  . insulin aspart protamine- aspart  67 Units Subcutaneous BID WC  . [START ON 02/23/2020] linaclotide  72 mcg Oral QAC breakfast  . [START ON 02/23/2020] losartan  50 mg Oral Daily  . [START ON 02/23/2020] pantoprazole  40 mg Oral Daily  . sodium chloride flush  3 mL Intravenous Q12H   . sodium chloride      Physical Exam: Blood pressure 130/66, pulse 74, temperature 98 F (36.7 C), temperature source Oral, resp. rate 18, weight 86.6 kg, SpO2 93 %.    Affect appropriate Obese black female  HEENT: normal Neck supple with no adenopathy JVP normal no bruits no thyromegaly Lungs clear with no wheezing and good diaphragmatic motion Heart:  S1/S2 no murmur, no rub, gallop or click PMI normal Abdomen: benighn, BS positve, no tenderness, no AAA no bruit.  No HSM or HJR Distal pulses intact with no bruits No edema Neuro non-focal Skin warm and dry No muscular weakness   Labs:   Lab Results  Component Value Date   WBC 11.5 (H) 02/22/2020   HGB 14.4 02/22/2020   HCT 45.3 02/22/2020   MCV 93.2 02/22/2020   PLT 250 02/22/2020    Recent Labs  Lab 02/22/20 1158  NA 138  K 3.9  CL 100  CO2 23  BUN 14  CREATININE 0.84  CALCIUM 9.7  PROT 7.3  BILITOT 0.6  ALKPHOS 75  ALT 24  AST 30  GLUCOSE 199*   Lab Results  Component Value Date   CKTOTAL 142 04/11/2015    Lab Results  Component Value Date   CHOL 157 11/03/2009   Lab Results  Component Value Date   HDL 29 11/03/2009   Lab Results  Component Value Date   LDLCALC 94  11/03/2009   Lab Results  Component Value Date   TRIG 171 11/03/2009   No results found for: CHOLHDL No results found for: LDLDIRECT    Radiology: CT ANGIO CHEST PE W OR WO CONTRAST  Result Date: 02/11/2020 CLINICAL DATA:  Chest pain, shortness of breath, RIGHT-side chest pain for 1 month, diabetes  mellitus, hypertension EXAM: CT ANGIOGRAPHY CHEST WITH CONTRAST TECHNIQUE: Multidetector CT imaging of the chest was performed using the standard protocol during bolus administration of intravenous contrast. Multiplanar CT image reconstructions and MIPs were obtained to evaluate the vascular anatomy. CONTRAST:  OMNIPAQUE IOHEXOL 350 MG/ML SOLN IV COMPARISON:  None FINDINGS: Cardiovascular: Atherosclerotic calcifications aorta and proximal great vessels. Aorta normal caliber without aneurysm or dissection. Heart unremarkable. No pericardial effusion. Pulmonary arteries adequately opacified and patent. No evidence of pulmonary embolism. Mediastinum/Nodes: Esophagus unremarkable. Base of cervical region normal appearance. No thoracic adenopathy. Lungs/Pleura: Minimal atelectasis versus scarring in medial RIGHT lower lobe adjacent to endplate spurs. Remaining lungs clear. No pulmonary infiltrate, pleural effusion or pneumothorax. Upper Abdomen: Gallbladder surgically absent. Visualized upper abdomen otherwise unremarkable Musculoskeletal: Prior thoracolumbar spinal fixation. No acute osseous findings. Review of the MIP images confirms the above findings. IMPRESSION: No evidence of pulmonary embolism. No acute intrathoracic abnormalities. Aortic Atherosclerosis (ICD10-I70.0). Electronically Signed   By: Ulyses Southward M.D.   On: 02/11/2020 13:04   DG Chest Port 1 View  Result Date: 02/22/2020 CLINICAL DATA:  Chest pain, shortness of breath EXAM: PORTABLE CHEST 1 VIEW COMPARISON:  02/28/2018, 02/11/2020 FINDINGS: The heart size and mediastinal contours are within normal limits. Persistent elevation of the right hemidiaphragm. Atherosclerotic calcification of the aortic knob. Both lungs are clear. The visualized skeletal structures are unremarkable. IMPRESSION: No active disease. Electronically Signed   By: Duanne Guess D.O.   On: 02/22/2020 12:36    EKG: NSR rate 91 poor R wave progression no acute changes      ASSESSMENT AND PLAN:   1. Chest Pain:  Appears non cardiac No acute ECG changes, cardiac CTA 06/11/19 with no CAD and calcium score of 0 and troponin is negative. No need for further ischemic w/u   2. Dyspnea:  Sats ok No abnormality on exam or CXR. Hct ok Check TSH. Will do f/u echo suspect "secundum ASD: noted on CT was just a PFO but will ask tech to focus on this and do bubble study CT was negative for PE   3. Carotid:  Known moderate Left ICA stenosis To my review duplex on 01/07/20 only showed 1-39% plaque bilaterally no need for f/u at least 2 years  4. HTN:  Well controlled.  Continue current medications and low sodium Dash type diet.    5. DM:  Discussed low carb diet.  Target hemoglobin A1c is 6.5 or less.  Continue current medications.  6. HLD continue statin labs with primary    Signed: Charlton Haws 02/22/2020, 5:03 PM

## 2020-02-22 NOTE — ED Notes (Signed)
Granddaughter -[phone] tyniua

## 2020-02-23 ENCOUNTER — Observation Stay (HOSPITAL_BASED_OUTPATIENT_CLINIC_OR_DEPARTMENT_OTHER): Payer: Medicare Other

## 2020-02-23 DIAGNOSIS — R609 Edema, unspecified: Secondary | ICD-10-CM

## 2020-02-23 DIAGNOSIS — E669 Obesity, unspecified: Secondary | ICD-10-CM | POA: Diagnosis present

## 2020-02-23 DIAGNOSIS — R0600 Dyspnea, unspecified: Secondary | ICD-10-CM | POA: Diagnosis not present

## 2020-02-23 DIAGNOSIS — R079 Chest pain, unspecified: Secondary | ICD-10-CM

## 2020-02-23 LAB — BASIC METABOLIC PANEL
Anion gap: 11 (ref 5–15)
BUN: 15 mg/dL (ref 8–23)
CO2: 28 mmol/L (ref 22–32)
Calcium: 9.5 mg/dL (ref 8.9–10.3)
Chloride: 102 mmol/L (ref 98–111)
Creatinine, Ser: 0.8 mg/dL (ref 0.44–1.00)
GFR calc Af Amer: >60 mL/min (ref >60)
GFR calc non Af Amer: >60 mL/min (ref >60)
Glucose, Bld: 89 mg/dL (ref 70–99)
Potassium: 3.6 mmol/L (ref 3.5–5.1)
Sodium: 141 mmol/L (ref 135–145)

## 2020-02-23 LAB — GLUCOSE, CAPILLARY
Glucose-Capillary: 88 mg/dL (ref 70–99)
Glucose-Capillary: 183 mg/dL — ABNORMAL HIGH (ref 70–99)

## 2020-02-23 LAB — HIV ANTIBODY (ROUTINE TESTING W REFLEX): HIV Screen 4th Generation wRfx: NONREACTIVE

## 2020-02-23 MED ORDER — HYDROCHLOROTHIAZIDE 12.5 MG PO CAPS: 12.5000 mg | ORAL_CAPSULE | Freq: Every day | ORAL | 0 refills | Status: DC

## 2020-02-23 NOTE — Progress Notes (Signed)
Ambulated pt in the hall and she tolerated it well no CP, SOB or dizziness. Pt O2 remained between 94%-96% on RA.

## 2020-02-23 NOTE — Progress Notes (Addendum)
Progress Note  Patient Name: [name] Date of Encounter: 02/23/2020  Primary Cardiologist: Prentice Docker, MD   Subjective   Echo bubble study being performed. Patient says she had dull 2/10 chest discomfort ovenright that lasted about 10 minutes. She feels breathing is intermittently short.    Inpatient Medications    Scheduled Meds:  amLODipine  10 mg Oral Daily   aspirin  81 mg Oral Daily   atorvastatin  20 mg Oral Daily   enoxaparin (LOVENOX) injection  40 mg Subcutaneous Q24H   hydrochlorothiazide  12.5 mg Oral Daily   insulin aspart  0-9 Units Subcutaneous TID WC   linaclotide  72 mcg Oral QAC breakfast   losartan  50 mg Oral Daily   pantoprazole  40 mg Oral Daily   sodium chloride flush  3 mL Intravenous Q12H   Continuous Infusions:  sodium chloride     PRN Meds: sodium chloride, acetaminophen, hydrALAZINE, ondansetron (ZOFRAN) IV, sodium chloride flush   Vital Signs    Vitals:   02/22/20 2245 02/22/20 2300 02/23/20 0500 02/23/20 0522  BP: 126/61   122/61  Pulse: 73   72  Resp: 19   20  Temp: 98.1 F (36.7 C)   98.1 F (36.7 C)  TempSrc: Oral   Oral  SpO2: 94%   95%  Weight:  84.5 kg 84.5 kg     Intake/Output Summary (Last 24 hours) at 02/23/2020 0834 Last data filed at 02/22/2020 2330 Gross per 24 hour  Intake 360 ml  Output --  Net 360 ml   Last 3 Weights 02/23/2020 02/22/2020 02/22/2020  Weight (lbs) 186 lb 4.6 oz 186 lb 4.6 oz 190 lb 14.7 oz  Weight (kg) 84.5 kg 84.5 kg 86.6 kg      Telemetry    NSR HR 70s - Personally Reviewed  ECG    No new - Personally Reviewed  Physical Exam   GEN: No acute distress.   Neck: No JVD Cardiac: RRR, no murmurs, rubs, or gallops.  Respiratory: Clear to auscultation bilaterally. GI: Soft, nontender, non-distended  MS: No edema; No deformity. Neuro:  Nonfocal  Psych: Normal affect   Labs    High Sensitivity Troponin:   Recent Labs  Lab 02/22/20 1158 02/22/20 1355  TROPONINIHS 9  13      Chemistry Recent Labs  Lab 02/22/20 1158 02/23/20 0326  NA 138 141  K 3.9 3.6  CL 100 102  CO2 23 28  GLUCOSE 199* 89  BUN 14 15  CREATININE 0.84 0.80  CALCIUM 9.7 9.5  PROT 7.3  --   ALBUMIN 4.1  --   AST 30  --   ALT 24  --   ALKPHOS 75  --   BILITOT 0.6  --   GFRNONAA >60 >60  GFRAA >60 >60  ANIONGAP 15 11     Hematology Recent Labs  Lab 02/22/20 1158  WBC 11.5*  RBC 4.86  HGB 14.4  HCT 45.3  MCV 93.2  MCH 29.6  MCHC 31.8  RDW 13.3  PLT 250    BNP Recent Labs  Lab 02/22/20 1156  BNP 40.9     DDimer No results for input(s): DDIMER in the last 168 hours.   Radiology    DG Chest Port 1 View  Result Date: 02/22/2020 CLINICAL DATA:  Chest pain, shortness of breath EXAM: PORTABLE CHEST 1 VIEW COMPARISON:  02/28/2018, 02/11/2020 FINDINGS: The heart size and mediastinal contours are within normal limits. Persistent elevation of the  right hemidiaphragm. Atherosclerotic calcification of the aortic knob. Both lungs are clear. The visualized skeletal structures are unremarkable. IMPRESSION: No active disease. Electronically Signed   By: Davina Poke D.O.   On: 02/22/2020 12:36    Cardiac Studies   Cardiac CT 05/2019 IMPRESSION: 1. Coronary calcium score of 0. This was 0 percentile for age and sex matched control.   2. Normal coronary origin with right dominance.   3. No evidence of CAD.   4.  Small secundum ASD noted.    Echo 2019 Study Conclusions   - Left ventricle: The cavity size was normal. There was mild focal    basal hypertrophy of the septum. Systolic function was normal.    The estimated ejection fraction was in the range of 55% to 60%.    Wall motion was normal; there were no regional wall motion    abnormalities. Indeterminate diastolic function.  - Aortic valve: Mildly calcified annulus. Trileaflet.  - Mitral valve: There was trivial regurgitation.  - Right atrium: Central venous pressure (est): 3 mm Hg.  - Atrial  septum: No defect or patent foramen ovale was identified.  - Tricuspid valve: There was trivial regurgitation.  - Pulmonary arteries: Systolic pressure could not be accurately    estimated.  - Pericardium, extracardiac: There was no pericardial effusion.   Echo with bubble study ordered  Patient Profile     69 y.o. female with pmh of left ICA stenosis, HTN, HLD, Cardiac CT in  05/2019 which showed small secundum ASD and coronary calcium score of 0 and no CAD per  who is being seen for chest pain and dyspnea.   Assessment & Plan    Chest pain - EKG with no acute changes - HS trop 9>13 - Cardiac CT 05/2019 with calcium score of 0 and no CAD - no further ischemic work-up. Consider non-cardiac sources of chest pain.  Dyspnea/Fluid overload - received 1 dose of lasix in the ED - Prior echo with indeterminate diastolic function. BNP normal - CXR negative - TSH 1.16 - Cardiac CT negative for PE - bubble echo to evaluate secundum ASD  B/L Carotid artery disease - recent US duplex with 1-39% on R&L  HTN - controlled  For questions or updates, please contact [facility] Please consult [email or web address] for contact info under        Signed, Angela Ninfa Meeker, PA-C  02/23/2020, 8:34 AM    Still complains of dyspnea. No obvious cardiac etiology TTE reviewed and normal EF 65% no evidence of cor pulmonale normal RV size and function Bubble study only positive after 6 beats suggesting more pulmonary shunt and not secundum ASD which is also not really visualized by 2D or color.  No further cardiac w/u needed will arrange outpatient f/u with Dr [name] in [place] MD [facility]

## 2020-02-23 NOTE — Discharge Summary (Signed)
Physician Discharge Summary  [name] ZOX:[id number] DOB: December 20, 1950 DOA: 02/22/2020  PCP: Mirna Mires, MD  Admit date: 02/22/2020 Discharge date: 02/23/2020  Admitted From: home Discharge disposition: home   Recommendations for Outpatient Follow-Up:   1. Cardiology will arrange for OP follow up with Dr. Darl Householder 2. Follow up with PCP 1-2 weeks for evaluation of BP control and diabetes control.   Discharge Diagnosis:   Principal Problem:   Chest pain at rest Active Problems:   Diabetes [facility])   Essential hypertension   Hyperlipidemia   Obesity    Discharge Condition: Improved.  Diet recommendation: Low sodium, heart healthy.  Carbohydrate-modified.   Wound care: None.  Code status: Full.   History of Present Illness:   [name] is a 69 y.o. female with medical history significant of hypertension hyperlipidemia, IDDM, GERD, with insulin resistance, presented May 10 with new onset of chest pain.  Symptoms started about 1 month prior, almost every morning she will experience about 1 to 2 hours of localized sharp-like chest pain, midsternal, nonradiating, severity 5-8/10, sometimes associated with short of breath.  She went to seek help with her GI, who she is following for GERD, and her GI doctor ordered a CTA on 01/22/2020, which showed negative for PE.  And her GI doctor started her on ibuprofen for  costochondritis.  She been taking ibuprofen 3 times a day for more than a week, without significant improvement of her chest pain which still happens every morning.  She says sometimes deep breathing or moving shoulder to the right side can make the pain worse.  Denied any fever chills cough.  Meantime over previous few weeks she started to feel more short of breath and increasing leg swelling.  She had been using 2 pillows instead of 1 for the last 2 weeks to improve her short of breath at night.  She denied any baseline lung problems. ED Course:  Troponin II sets negative, EKG reassuring    Hospital Course by Problem:   #1.  Chest pain/sob.  Atypical. May be related to difficult to control BP.  EKG without acute findings. HS trops 9>>13.  Chest x-ray with no active disease.  TTE normal EF 65% no evidence of cor pulmonale,normal RV size and function.  She had bilateral lower extremity Dopplers performed negative for DVT.  She had CTA April 2021 which was negative for PE.  Cardiac CT August 2020 no CAD. Provided with IV lasix as some concern for new onset CHF. On day of discharge no LE edema, no orthopnea.  No further chest pain.  Evaluated by cardiology who opined noncardiac source of her chest pain and recommend no further  work-up. Follow up with primary cardiologist OP  #2.  Uncontrolled hypertension.  Home medications include amlodipine and losartan.  Metoprolol is listed but she says she does not take it.  Losartan and amlodipine were continued and hydrochlorothiazide was added.  At discharge blood pressure high end of normal but improved.  Recommend close outpatient follow-up for evaluation of blood pressure control  3.  Diabetes.  Uncontrolled.  Likely related to dietary noncompliance.  Hemoglobin A1c 10.3.  She is on a high dose of insulin.  During her hospitalization her capillary blood sugars have been in the 80s and 90s likely related to controlled diet.  Discussed importance of dietary restrictions monitoring of her blood sugar.  Recommend close outpatient follow-up for optimal control.  #4.  GERD.  Stable at baseline.  May be contributing to #1.  Had outpatient evaluation with gastroenterology prior to admission.  Continue her PPI   Medical Consultants:    [facility]  Discharge Exam:   Vitals:   02/22/20 2245 02/23/20 0522  BP: 126/61 122/61  Pulse: 73 72  Resp: 19 20  Temp: 98.1 F (36.7 C) 98.1 F (36.7 C)  SpO2: 94% 95%   Vitals:   02/22/20 2245 02/22/20 2300 02/23/20 0500 02/23/20 0522  BP: 126/61    122/61  Pulse: 73   72  Resp: 19   20  Temp: 98.1 F (36.7 C)   98.1 F (36.7 C)  TempSrc: Oral   Oral  SpO2: 94%   95%  Weight:  84.5 kg 84.5 kg     General exam: Appears calm and comfortable. No acute distress Respiratory system: Clear to auscultation. Respiratory effort normal. Cardiovascular system: S1 & S2 heard, RRR. No JVD,  rubs, gallops or clicks. No murmurs. Gastrointestinal system: Abdomen is nondistended, soft and nontender. No organomegaly or masses felt. Normal bowel sounds heard. Central nervous system: Alert and oriented. No focal neurological deficits. Extremities: No clubbing,  or cyanosis. No edema. Skin: No rashes, lesions or ulcers. Psychiatry: Judgement and insight appear normal. Mood & affect appropriate.    The results of significant diagnostics from this hospitalization (including imaging, microbiology, ancillary and laboratory) are listed below for reference.     Procedures and Diagnostic Studies:   DG Chest Port 1 View  Result Date: 02/22/2020 CLINICAL DATA:  Chest pain, shortness of breath EXAM: PORTABLE CHEST 1 VIEW COMPARISON:  02/28/2018, 02/11/2020 FINDINGS: The heart size and mediastinal contours are within normal limits. Persistent elevation of the right hemidiaphragm. Atherosclerotic calcification of the aortic knob. Both lungs are clear. The visualized skeletal structures are unremarkable. IMPRESSION: No active disease. Electronically Signed   By: Duanne Guess D.O.   On: 02/22/2020 12:36   ECHOCARDIOGRAM COMPLETE BUBBLE STUDY  Result Date: 02/23/2020    ECHOCARDIOGRAM REPORT   Patient Name:   [name] Date of Exam: 02/23/2020 Medical Rec #:  [id number]             Height:       65.0 in Accession #:    [id number]            Weight:       186.3 lb Date of Birth:  27-May-1951             BSA:          1.919 m Patient Age:    69 years              BP:           122/61 mmHg Patient Gender: F                     HR:           72 bpm. Exam  Location:  Inpatient Procedure: 2D Echo, Cardiac Doppler, Color Doppler and Saline Contrast Bubble            Study Indications:    Dyspnea  History:        Patient has prior history of Echocardiogram examinations, most                 recent 08/15/2018. Risk Factors:Diabetes, Dyslipidemia and                 Hypertension.  Sonographer:    Ross Ludwig RDCS (  AE) Referring Phys: [id number] PETER C NISHAN IMPRESSIONS  1. Left ventricular ejection fraction, by estimation, is 60 to 65%. The left ventricle has normal function. The left ventricle has no regional wall motion abnormalities. There is mild concentric left ventricular hypertrophy. Left ventricular diastolic parameters were normal.  2. Right ventricular systolic function is normal. The right ventricular size is normal. Tricuspid regurgitation signal is inadequate for assessing PA pressure.  3. The mitral valve is normal in structure. No evidence of mitral valve regurgitation. No evidence of mitral stenosis.  4. The aortic valve is normal in structure. Aortic valve regurgitation is not visualized. No aortic stenosis is present.  5. The inferior vena cava is normal in size with greater than 50% respiratory variability, suggesting right atrial pressure of 3 mmHg.  6. Agitated saline contrast bubble study was positive with shunting observed within 3-6 cardiac cycles suggestive of interatrial shunt. The right to left shunt is small at rest, moderate following the Valsalva maneuver. Comparison(s): No significant change from prior study. FINDINGS  Left Ventricle: Left ventricular ejection fraction, by estimation, is 60 to 65%. The left ventricle has normal function. The left ventricle has no regional wall motion abnormalities. The left ventricular internal cavity size was normal in size. There is  mild concentric left ventricular hypertrophy. Left ventricular diastolic parameters were normal. Right Ventricle: The right ventricular size is normal. No increase in right  ventricular wall thickness. Right ventricular systolic function is normal. Tricuspid regurgitation signal is inadequate for assessing PA pressure. The tricuspid regurgitant velocity is 2.56 m/s, and with an assumed right atrial pressure of 8 mmHg, the estimated right ventricular systolic pressure is 34.2 mmHg. Left Atrium: Left atrial size was normal in size. Right Atrium: Right atrial size was normal in size. Pericardium: There is no evidence of pericardial effusion. Mitral Valve: The mitral valve is normal in structure. There is mild thickening of the mitral valve leaflet(s). Normal mobility of the mitral valve leaflets. No evidence of mitral valve regurgitation. No evidence of mitral valve stenosis. MV peak gradient, 2.8 mmHg. The mean mitral valve gradient is 1.0 mmHg. Tricuspid Valve: The tricuspid valve is normal in structure. Tricuspid valve regurgitation is not demonstrated. No evidence of tricuspid stenosis. Aortic Valve: The aortic valve is normal in structure. Aortic valve regurgitation is not visualized. No aortic stenosis is present. Aortic valve mean gradient measures 4.7 mmHg. Aortic valve peak gradient measures 8.7 mmHg. Aortic valve area, by VTI measures 2.43 cm. Pulmonic Valve: The pulmonic valve was normal in structure. Pulmonic valve regurgitation is not visualized. No evidence of pulmonic stenosis. Aorta: The aortic root is normal in size and structure. Venous: The inferior vena cava is normal in size with greater than 50% respiratory variability, suggesting right atrial pressure of 3 mmHg. IAS/Shunts: There is redundancy of the interatrial septum. No shunts are seen by color Doppler. Agitated saline contrast was given intravenously to evaluate for intracardiac shunting. Agitated saline contrast bubble study was positive with shunting observed within 3-6 cardiac cycles suggestive of interatrial shunt. The right to left shunt is small at rest, moderate following the Valsalva maneuver.  LEFT  VENTRICLE PLAX 2D LVIDd:         4.50 cm  Diastology LVIDs:         3.11 cm  LV e' lateral:   10.10 cm/s LV PW:         1.33 cm  LV E/e' lateral: 7.3 LV IVS:        1.18 cm  LV e' medial:    6.85 cm/s LVOT diam:     2.00 cm  LV E/e' medial:  10.8 LV SV:         74 LV SV Index:   38 LVOT Area:     3.14 cm  RIGHT VENTRICLE             IVC RV Basal diam:  2.97 cm     IVC diam: 1.61 cm RV S prime:     11.30 cm/s TAPSE (M-mode): 2.6 cm LEFT ATRIUM             Index       RIGHT ATRIUM           Index LA diam:        3.60 cm 1.88 cm/m  RA Area:     14.70 cm LA Vol (A2C):   68.2 ml 35.53 ml/m RA Volume:   34.60 ml  18.03 ml/m LA Vol (A4C):   35.6 ml 18.55 ml/m LA Biplane Vol: 50.4 ml 26.26 ml/m  AORTIC VALVE AV Area (Vmax):    2.39 cm AV Area (Vmean):   2.37 cm AV Area (VTI):     2.43 cm AV Vmax:           147.33 cm/s AV Vmean:          101.333 cm/s AV VTI:            0.303 m AV Peak Grad:      8.7 mmHg AV Mean Grad:      4.7 mmHg LVOT Vmax:         112.00 cm/s LVOT Vmean:        76.300 cm/s LVOT VTI:          0.234 m LVOT/AV VTI ratio: 0.77  AORTA Ao Root diam: 2.80 cm Ao Asc diam:  3.20 cm MITRAL VALVE               TRICUSPID VALVE MV Area (PHT): 3.12 cm    TR Peak grad:   26.2 mmHg MV Peak grad:  2.8 mmHg    TR Vmax:        256.00 cm/s MV Mean grad:  1.0 mmHg MV Vmax:       0.84 m/s    SHUNTS MV Vmean:      55.0 cm/s   Systemic VTI:  0.23 m MV Decel Time: 243 msec    Systemic Diam: 2.00 cm MV E velocity: 73.70 cm/s MV A velocity: 88.60 cm/s MV E/A ratio:  0.83 Mihai Croitoru MD Electronically signed by Thurmon Fair MD Signature Date/Time: 02/23/2020/9:39:21 AM    Final    VAS [place] LOWER EXTREMITY VENOUS (DVT)  Result Date: 02/23/2020  Lower Venous DVTStudy Indications: Edema.  Comparison Study: none Performing Technologist: Jeb Levering RDMS, RVT  Examination Guidelines: A complete evaluation includes B-mode imaging, spectral Doppler, color Doppler, and power Doppler as needed of all accessible portions of  each vessel. Bilateral testing is considered an integral part of a complete examination. Limited examinations for reoccurring indications may be performed as noted. The reflux portion of the exam is performed with the patient in reverse Trendelenburg.  +---------+---------------+---------+-----------+----------+--------------+ RIGHT    CompressibilityPhasicitySpontaneityPropertiesThrombus Aging +---------+---------------+---------+-----------+----------+--------------+ CFV      Full           Yes      Yes                                 +---------+---------------+---------+-----------+----------+--------------+  SFJ      Full                                                        +---------+---------------+---------+-----------+----------+--------------+ FV Prox  Full                                                        +---------+---------------+---------+-----------+----------+--------------+ FV Mid   Full                                                        +---------+---------------+---------+-----------+----------+--------------+ FV DistalFull                                                        +---------+---------------+---------+-----------+----------+--------------+ PFV      Full                                                        +---------+---------------+---------+-----------+----------+--------------+ POP      Full           Yes      Yes                                 +---------+---------------+---------+-----------+----------+--------------+ PTV      Full                                                        +---------+---------------+---------+-----------+----------+--------------+ PERO     Full                                                        +---------+---------------+---------+-----------+----------+--------------+   +---------+---------------+---------+-----------+----------+--------------+ LEFT      CompressibilityPhasicitySpontaneityPropertiesThrombus Aging +---------+---------------+---------+-----------+----------+--------------+ CFV      Full           Yes      Yes                                 +---------+---------------+---------+-----------+----------+--------------+ SFJ      Full                                                        +---------+---------------+---------+-----------+----------+--------------+  FV Prox  Full                                                        +---------+---------------+---------+-----------+----------+--------------+ FV Mid   Full                                                        +---------+---------------+---------+-----------+----------+--------------+ FV DistalFull                                                        +---------+---------------+---------+-----------+----------+--------------+ PFV      Full                                                        +---------+---------------+---------+-----------+----------+--------------+ POP      Full           Yes      Yes                                 +---------+---------------+---------+-----------+----------+--------------+ PTV      Full                                                        +---------+---------------+---------+-----------+----------+--------------+ PERO     Full                                                        +---------+---------------+---------+-----------+----------+--------------+     Summary: RIGHT: - There is no evidence of deep vein thrombosis in the lower extremity.  - A cystic structure is found in the popliteal fossa.  LEFT: - There is no evidence of deep vein thrombosis in the lower extremity.  - No cystic structure found in the popliteal fossa.  *See table(s) above for measurements and observations.    Preliminary      Labs:   Basic Metabolic Panel: Recent Labs  Lab 02/22/20 1158 02/23/20 0326    NA 138 141  K 3.9 3.6  CL 100 102  CO2 23 28  GLUCOSE 199* 89  BUN 14 15  CREATININE 0.84 0.80  CALCIUM 9.7 9.5   GFR Estimated Creatinine Clearance: 71.2 mL/min (by C-G formula based on SCr of 0.8 mg/dL). Liver Function Tests: Recent Labs  Lab 02/22/20 1158  AST 30  ALT 24  ALKPHOS 75  BILITOT 0.6  PROT 7.3  ALBUMIN 4.1   No results for input(s): LIPASE, AMYLASE in the last 168  hours. No results for input(s): AMMONIA in the last 168 hours. Coagulation profile No results for input(s): INR, PROTIME in the last 168 hours.  CBC: Recent Labs  Lab 02/22/20 1158  WBC 11.5*  HGB 14.4  HCT 45.3  MCV 93.2  PLT 250   Cardiac Enzymes: No results for input(s): CKTOTAL, CKMB, CKMBINDEX, TROPONINI in the last 168 hours. BNP: Invalid input(s): POCBNP CBG: Recent Labs  Lab 02/22/20 1739 02/22/20 2216 02/23/20 0606  GLUCAP 160* 95 88   D-Dimer No results for input(s): DDIMER in the last 72 hours. Hgb A1c Recent Labs    02/22/20 1957  HGBA1C 10.3*   Lipid Profile No results for input(s): CHOL, HDL, LDLCALC, TRIG, CHOLHDL, LDLDIRECT in the last 72 hours. Thyroid function studies Recent Labs    02/22/20 1957  TSH 1.163   Anemia work up No results for input(s): VITAMINB12, FOLATE, FERRITIN, TIBC, IRON, RETICCTPCT in the last 72 hours. Microbiology Recent Results (from the past 240 hour(s))  SARS Coronavirus 2 by RT PCR (hospital order, performed in [facility] hospital lab) Nasopharyngeal Nasopharyngeal Swab     Status: None   Collection Time: 02/22/20  9:50 PM   Specimen: Nasopharyngeal Swab  Result Value Ref Range Status   SARS Coronavirus 2 NEGATIVE NEGATIVE Final    Comment: (NOTE) SARS-CoV-2 target nucleic acids are NOT DETECTED. The SARS-CoV-2 RNA is generally detectable in upper and lower respiratory specimens during the acute phase of infection. The lowest concentration of SARS-CoV-2 viral copies this assay can detect is 250 copies / mL. A negative  result does not preclude SARS-CoV-2 infection and should not be used as the sole basis for treatment or other patient management decisions.  A negative result may occur with improper specimen collection / handling, submission of specimen other than nasopharyngeal swab, presence of viral mutation(s) within the areas targeted by this assay, and inadequate number of viral copies (<250 copies / mL). A negative result must be combined with clinical observations, patient history, and epidemiological information. Fact Sheet for Patients:   BoilerBrush.com.cy Fact Sheet for Healthcare Providers: https://pope.com/ This test is not yet approved or cleared  by the [place] FDA and has been authorized for detection and/or diagnosis of SARS-CoV-2 by FDA under an Emergency Use Authorization (EUA).  This EUA will remain in effect (meaning this test can be used) for the duration of the COVID-19 declaration under Section 564(b)(1) of the Act, 21 U.S.C. section 360bbb-3(b)(1), unless the authorization is terminated or revoked sooner. Performed at [facility], [address] [place], [place] [address]      Discharge Instructions:   Discharge Instructions    Call MD for:  severe uncontrolled pain   Complete by: As directed    Call MD for:  temperature >100.4   Complete by: As directed    Diet - low sodium heart healthy   Complete by: As directed    Discharge instructions   Complete by: As directed    Take medications as prescribed Monitor BP daily and document Take BP reading to PCP/cardiology follow up appointment for evaluation of BP control in 1-2 weeks Follow up with PCP 1-2 weeks for evaluation of diabetes control Follow up with cardiology 1-2 weeks   Increase activity slowly   Complete by: As directed      Allergies as of 02/23/2020   No Known Allergies     Medication List    STOP taking these medications   metoprolol  tartrate 100 MG tablet  Commonly known as: LOPRESSOR     TAKE these medications   amLODipine 10 MG tablet Commonly known as: NORVASC Take 10 mg by mouth daily.   aspirin 81 MG chewable tablet Chew 81 mg by mouth daily.   hydrochlorothiazide 12.5 MG capsule Commonly known as: MICROZIDE Take 1 capsule (12.5 mg total) by mouth daily. Start taking on: Feb 24, 2020   insulin NPH-regular Human (70-30) 100 UNIT/ML injection Inject 67 Units into the skin 2 (two) times daily.   linaclotide 72 MCG capsule Commonly known as: Linzess Take 1 capsule (72 mcg total) by mouth daily before breakfast.   losartan 50 MG tablet Commonly known as: COZAAR Take 50 mg by mouth daily.   metFORMIN 1000 MG tablet Commonly known as: GLUCOPHAGE Take 1,000 mg by mouth at bedtime.   methocarbamol 500 MG tablet Commonly known as: Robaxin Take 1 tablet (500 mg total) by mouth every 8 (eight) hours as needed for muscle spasms.   nitroGLYCERIN 0.4 MG SL tablet Commonly known as: NITROSTAT Place 0.4 mg under the tongue every 5 (five) minutes as needed for chest pain. Dissolve one tablet under the tongue every 5 mintues as needed for chest pain.   OneTouch Verio test strip Generic drug: glucose blood See admin instructions.   pantoprazole 40 MG tablet Commonly known as: PROTONIX Take 40 mg by mouth daily.   RELION INSULIN SYRINGE 1ML/31G 31G X 5/16" 1 ML Misc Generic drug: Insulin Syringe-Needle U-100 USE 1 TWICE DAILY AS DIRECTED   simvastatin 20 MG tablet Commonly known as: ZOCOR Take 20 mg by mouth at bedtime.   Tradjenta 5 MG Tabs tablet Generic drug: linagliptin Take 5 mg by mouth daily with breakfast.         Time coordinating discharge: 45 minutes  Signed:  Gwenyth Bender NP  [facility] 02/23/2020, 11:12 AM

## 2020-02-23 NOTE — Progress Notes (Signed)
  Echocardiogram 2D Echocardiogram with bubble study has been performed.  Gerda Diss 02/23/2020, 9:10 AM

## 2020-02-23 NOTE — TOC Initial Note (Signed)
Transition of Care [facility]) - Initial/Assessment Note    Patient Details  Name: [name] MRN: [id number] Date of Birth: [birth year]  Transition of Care [facility]) CM/SW Contact:    Alexander Mt, LCSW Phone Number: 02/23/2020, 12:54 PM  Clinical Narrative:                 CSW spoke with pt via telephone as CSW was off unit and d/c order has been placed. Pt from home with her sister [name]. Confirmed home address and PCP. She is independent with ADL and IADLs. She is agreeable to CSW making a f/u appointment with her physician. Preference for morning appointment.   Pt aware of discharge today and her sister will pick her up.   Expected Discharge Plan: Home/Self Care Barriers to Discharge: No Barriers Identified   Patient Goals and CMS Choice Patient states their goals for this hospitalization and ongoing recovery are:: return home today   Choice offered to / list presented to : Patient  Expected Discharge Plan and Services Expected Discharge Plan: Home/Self Care In-house Referral: Clinical Social Work Discharge Planning Services: CM Consult, Follow-up appt scheduled   Living arrangements for the past 2 months: Single Family Home Expected Discharge Date: 02/23/20       Prior Living Arrangements/Services Living arrangements for the past 2 months: Single Family Home Lives with:: Siblings Patient language and need for interpreter reviewed:: Yes(no needs) Do you feel safe going back to the place where you live?: Yes      Need for Family Participation in Patient Care: Yes (Comment)(assistance with daily cares) Care giver support system in place?: Yes (comment)(pt sister)   Criminal Activity/Legal Involvement Pertinent to Current Situation/Hospitalization: No - Comment as needed  Activities of Daily Living Home Assistive Devices/Equipment: None ADL Screening (condition at time of admission) Patient's cognitive ability adequate to safely complete daily activities?:  Yes Is the patient deaf or have difficulty hearing?: No Does the patient have difficulty seeing, even when wearing glasses/contacts?: No Does the patient have difficulty concentrating, remembering, or making decisions?: No Patient able to express need for assistance with ADLs?: Yes Does the patient have difficulty dressing or bathing?: No Independently performs ADLs?: Yes (appropriate for developmental age) Does the patient have difficulty walking or climbing stairs?: Yes Weakness of Legs: Both Weakness of Arms/Hands: None  Permission Sought/Granted Permission sought to share information with : PCP       Permission granted to share info w AGENCY: Dr. Berdine Addison        Emotional Assessment Appearance:: Other (Comment Required(telephonic assessment) Attitude/Demeanor/Rapport: Other (comment)(telephonic assessment) Affect (typically observed): Other (comment)(telephonic assessment) Orientation: : Oriented to Self, Oriented to Place, Oriented to Situation, Oriented to  Time Alcohol / Substance Use: Not Applicable Psych Involvement: No (comment)  Admission diagnosis:  Shortness of breath [R06.02] Chest pain [R07.9] Chest pain with moderate risk for cardiac etiology [R07.9] Patient Active Problem List   Diagnosis Date Noted  . Obesity 02/23/2020  . Chest pain with moderate risk for cardiac etiology 02/22/2020  . Shortness of breath   . Carotid artery stenosis 01/07/2020  . Constipation 09/03/2019  . Dysphagia 08/11/2018  . Leg swelling 08/11/2018  . Bilateral carotid bruits 08/11/2018  . Abnormal EKG 08/06/2018  . Acute blood loss as cause of postoperative anemia 03/07/2018    Class: Acute  . Spinal stenosis, lumbar region with neurogenic claudication 03/04/2018    Class: Chronic  . Multilevel degenerative disc disease 03/04/2018    Class: Chronic  . Forestier's  disease of thoracolumbar region 03/04/2018    Class: Chronic  . Fusion of spine of thoracolumbar region 03/04/2018  .  Pain in right hip 03/18/2017  . Stiffness of joint, not elsewhere classified, other specified site 08/04/2013  . Leg weakness, bilateral 08/04/2013  . Chest pain at rest 05/10/2009  . BENIGN POSITIONAL VERTIGO 07/30/2007  . VARICOSE VEINS, LOWER EXTREMITIES 07/30/2007  . SCIATICA, CHRONIC 07/30/2007  . Diabetes (HCC) 11/18/2006  . Hyperlipidemia 11/18/2006  . MIGRAINE HEADACHE 11/18/2006  . LESION, SCIATIC NERVE 11/18/2006  . Essential hypertension 11/18/2006  . HYPOTENSION, ORTHOSTATIC 11/18/2006  . BRONCHITIS NOS 11/18/2006  . Other emphysema (HCC) 11/18/2006  . ASTHMA 11/18/2006  . OSTEOARTHRITIS 11/18/2006  . LOW BACK PAIN 11/18/2006  . VERTIGO 11/18/2006   PCP:  Mirna Mires, MD Pharmacy:   [facility] [address] [place] [address] [place] [address] [place] [address] [place] [address] Phone: [phone] Fax: [phone]  Readmission Risk Interventions No flowsheet data found.

## 2020-02-23 NOTE — Progress Notes (Signed)
Lower venous duplex       has been completed. Preliminary results can be found under CV proc through chart review. Dahl Higinbotham, BS, RDMS, RVT   

## 2020-02-23 NOTE — Social Work (Signed)
F/u appointment made for Monday 5/17 at 11:15am. Added to AVS/discharge instructions.  Octavio Graves, MSW, LCSW [facility] Clinical Social Work

## 2020-02-29 DIAGNOSIS — R079 Chest pain, unspecified: Secondary | ICD-10-CM | POA: Diagnosis not present

## 2020-02-29 DIAGNOSIS — E1169 Type 2 diabetes mellitus with other specified complication: Secondary | ICD-10-CM | POA: Diagnosis not present

## 2020-02-29 DIAGNOSIS — I10 Essential (primary) hypertension: Secondary | ICD-10-CM | POA: Diagnosis not present

## 2020-03-03 ENCOUNTER — Ambulatory Visit: Payer: Medicare Other | Admitting: Physician Assistant

## 2020-03-03 ENCOUNTER — Encounter: Payer: Self-pay | Admitting: Physician Assistant

## 2020-03-03 ENCOUNTER — Other Ambulatory Visit: Payer: Self-pay

## 2020-03-03 VITALS — BP 114/60 | HR 90 | Ht 65.0 in | Wt 185.0 lb

## 2020-03-03 DIAGNOSIS — R931 Abnormal findings on diagnostic imaging of heart and coronary circulation: Secondary | ICD-10-CM | POA: Diagnosis not present

## 2020-03-03 DIAGNOSIS — R0600 Dyspnea, unspecified: Secondary | ICD-10-CM

## 2020-03-03 DIAGNOSIS — R0789 Other chest pain: Secondary | ICD-10-CM | POA: Diagnosis not present

## 2020-03-03 DIAGNOSIS — R0609 Other forms of dyspnea: Secondary | ICD-10-CM

## 2020-03-03 DIAGNOSIS — I10 Essential (primary) hypertension: Secondary | ICD-10-CM | POA: Diagnosis not present

## 2020-03-03 MED ORDER — AMLODIPINE BESYLATE 5 MG PO TABS
5.0000 mg | ORAL_TABLET | Freq: Every day | ORAL | 3 refills | Status: DC
Start: 2020-03-03 — End: 2021-02-14

## 2020-03-03 NOTE — Patient Instructions (Addendum)
Medication Instructions:  Your physician recommends that you continue on your current medications as directed. Please refer to the Current Medication list given to you today.  Decrease Amlodipine to 5 mg Daily   *If you need a refill on your cardiac medications before your next appointment, please call your pharmacy*   Lab Work: NONE   If you have labs (blood work) drawn today and your tests are completely normal, you will receive your results only by: Marland Kitchen MyChart Message (if you have MyChart) OR . A paper copy in the mail If you have any lab test that is abnormal or we need to change your treatment, we will call you to review the results.   Testing/Procedures: .NONE    Follow-Up: At [facility], you and your health needs are our priority.  As part of our continuing mission to provide you with exceptional heart care, we have created designated Provider Care Teams.  These Care Teams include your primary Cardiologist (physician) and Advanced Practice Providers (APPs -  Physician Assistants and Nurse Practitioners) who all work together to provide you with the care you need, when you need it.  We recommend signing up for the patient portal called "MyChart".  Sign up information is provided on this After Visit Summary.  MyChart is used to connect with patients for Virtual Visits (Telemedicine).  Patients are able to view lab/test results, encounter notes, upcoming appointments, etc.  Non-urgent messages can be sent to your provider as well.   To learn more about what you can do with MyChart, go to [email or web address].    Your next appointment:   6 month(s)  The format for your next appointment:   In Person  Provider:   Prentice Docker, MD   Other Instructions Thank you for choosing [facility]!  Please monitor your blood pressure occasionally at home. Call your doctor if you tend to get readings of greater than 130 on the top number or 80 on the bottom number.

## 2020-03-03 NOTE — Progress Notes (Addendum)
Cardiology Office Note    Date:  03/03/2020   ID:  [name], DOB 1951-08-21, MRN [id number]  PCP:  Iona Beard, MD  Cardiologist:  Kate Sable, MD  Electrophysiologist:  None   Chief Complaint: f/u hospitalization for chest pain  History of Present Illness:   [name] is a 69 y.o. female with history of mild carotid artery disease (followed by [facility]), HTN, HLD (followed in primary care), DM, asthma, migraines, varicose veins with mild chronic edema, arthritis who presents for post-hospital follow-up.   She has history of coronary CTA in 05/2019 showing no significant CAD or incidental findings, small secundum ASD noted. She recently saw GI and reported inspirational chest pain so CTA was ordered 02/11/20 and was negative for PE or acute abnormalities. She was recommended to take ibuprofen. She was subsequently admitted 02/22/20 with worsening SOB and continued intermittent chest pain, more with exertion and recumbency. hsTroponins were normal, BNP normal, and Covid was negative. LE venous duplex showed no DVT. There was no obvious cardiac etiology for her chest pain. She had an echo performed 02/23/20 to evaluate for the reported ASD on study last year. This showed EF 60-65%, mild LVH, normal RV, no significant valvular disease, bubble study positive within 3-6 cardiac cycles suggestive of intraatrial shunt, right to left shunt is small at rest, moderate following the Valsalva maneuver. This was not felt significantly different than prior study. Dr. Johnsie Cancel felt that this was more likely a pulmonary shunt and not a secundum ASD. No further inpatient cardiac workup was recommended. Labs otherwise personally reviewed showed K 3.6, Cr 0.80, TSH wnl, WBC 11.5, Hgb 14.4, 01/2020 LFTs wnl.  She is seen back for follow-up today. Her chest pain is overall better. She continues to note dyspnea that occurs both with talking and with exertion. She reports this started approximately  a month ago. She also complains of lower extremity edema which appears mild by examination. No weight loss, cough, bleeding, or syncope. BP recheck by me was 110/60.  Past Medical History:  Diagnosis Date  . Asthma   . Bronchitis   . Diabetes mellitus   . Hyperlipidemia   . Hypertension   . Interatrial cardiac shunt    a. small secundum ASD vs pulmonary shunt.  . Low back pain   . Migraine   . Mild carotid artery disease ([place])   . Osteoarthritis   . Sciatica   . Varicose veins   . Vertigo     Past Surgical History:  Procedure Laterality Date  . ABDOMINAL HYSTERECTOMY    . BIOPSY  05/12/2019   Procedure: BIOPSY;  Surgeon: Danie Binder, MD;  [facility];  Service: Endoscopy;;  . CARPAL TUNNEL RELEASE Bilateral   . CESAREAN SECTION    . ESOPHAGOGASTRODUODENOSCOPY (EGD) WITH PROPOFOL N/A 05/12/2019   Procedure: ESOPHAGOGASTRODUODENOSCOPY (EGD) WITH PROPOFOL;  Surgeon: Danie Binder, MD;  [facility];  Service: Endoscopy;  Laterality: N/A;  12:00pm  . INGUINAL HERNIA REPAIR Left   . LUMBAR FUSION    . POSTERIOR LUMBAR FUSION 4 LEVEL N/A 03/04/2018   Procedure: Right transforaminal lumbar interbody fusion L1-2, L2-3 Posterior fusion T10, T11, T12, L1, L2 with T 11, T12 pedicle screws, superior sublaminar hooks T10, local bone graft, allograft cancellous chips Vivigen;  Surgeon: Jessy Oto, MD;  Location: [facility];  Service: Orthopedics;  Laterality: N/A;  . SAVORY DILATION N/A 05/12/2019   Procedure: SAVORY DILATION;  Surgeon: Danie Binder, MD;  [facility];  Service: Endoscopy;  Laterality: N/A;    Current Medications: Current Meds  Medication Sig  . aspirin 81 MG chewable tablet Chew 81 mg by mouth daily.  . hydrochlorothiazide (MICROZIDE) 12.5 MG capsule Take 1 capsule (12.5 mg total) by mouth daily.  . insulin NPH-insulin regular (NOVOLIN 70/30) (70-30) 100 UNIT/ML injection Inject 67 Units into the skin 2 (two) times daily.   Marland Kitchen  linaclotide (LINZESS) 72 MCG capsule Take 1 capsule (72 mcg total) by mouth daily before breakfast.  . losartan (COZAAR) 50 MG tablet Take 50 mg by mouth daily.  . metFORMIN (GLUCOPHAGE) 1000 MG tablet Take 1,000 mg by mouth at bedtime.  . methocarbamol (ROBAXIN) 500 MG tablet Take 1 tablet (500 mg total) by mouth every 8 (eight) hours as needed for muscle spasms.  . nitroGLYCERIN (NITROSTAT) 0.4 MG SL tablet Place 0.4 mg under the tongue every 5 (five) minutes as needed for chest pain. Dissolve one tablet under the tongue every 5 mintues as needed for chest pain.  Letta Pate VERIO test strip See admin instructions.  . pantoprazole (PROTONIX) 40 MG tablet Take 40 mg by mouth daily.  Marland Kitchen RELION INSULIN SYRINGE 1ML/31G 31G X 5/16" 1 ML MISC USE 1 TWICE DAILY AS DIRECTED  . simvastatin (ZOCOR) 20 MG tablet Take 20 mg by mouth at bedtime.    . TRADJENTA 5 MG TABS tablet Take 5 mg by mouth daily with breakfast.   . [DISCONTINUED] amLODipine (NORVASC) 10 MG tablet Take 10 mg by mouth daily.      Allergies:   Patient has no known allergies.   Social History   Socioeconomic History  . Marital status: Single    Spouse name: Not on file  . Number of children: Not on file  . Years of education: 12th   . Highest education level: Not on file  Occupational History  . Occupation: Disabled    Employer: UNEMPLOYED  Tobacco Use  . Smoking status: Never Smoker  . Smokeless tobacco: Never Used  Substance and Sexual Activity  . Alcohol use: No    Alcohol/week: 0.0 standard drinks  . Drug use: No  . Sexual activity: Yes    Birth control/protection: Surgical  Other Topics Concern  . Not on file  Social History Narrative   No caffeine use. 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 2 CHILDREN: 1 IN [place], 1 IN MILITARY CURRENTLY STATIONED IN [place].   Social Determinants of Health   Financial Resource Strain:   . Difficulty of Paying Living Expenses:   Food  Insecurity:   . Worried About Programme researcher, broadcasting/film/video in the Last Year:   . Barista in the Last Year:   Transportation Needs:   . Freight forwarder (Medical):   Marland Kitchen Lack of Transportation (Non-Medical):   Physical Activity:   . Days of Exercise per Week:   . Minutes of Exercise per Session:   Stress:   . Feeling of Stress :   Social Connections:   . Frequency of Communication with Friends and Family:   . Frequency of Social Gatherings with Friends and Family:   . Attends Religious Services:   . Active Member of Clubs or Organizations:   . Attends Banker Meetings:   Marland Kitchen Marital Status:      Family History:  The patient's family history includes Heart failure in her sister and sister; Lung cancer in her mother; Stroke in her father. There is no  history of Colon cancer, Gastric cancer, or Esophageal cancer.  ROS:   Please see the history of present illness.  All other systems are reviewed and otherwise negative.    EKGs/Labs/Other Studies Reviewed:    Studies reviewed are outlined and summarized above. Reports included below if pertinent.  2D Echo 02/2020 1. Left ventricular ejection fraction, by estimation, is 60 to 65%. The  left ventricle has normal function. The left ventricle has no regional  wall motion abnormalities. There is mild concentric left ventricular  hypertrophy. Left ventricular diastolic  parameters were normal.  2. Right ventricular systolic function is normal. The right ventricular  size is normal. Tricuspid regurgitation signal is inadequate for assessing  PA pressure.  3. The mitral valve is normal in structure. No evidence of mitral valve  regurgitation. No evidence of mitral stenosis.  4. The aortic valve is normal in structure. Aortic valve regurgitation is  not visualized. No aortic stenosis is present.  5. The inferior vena cava is normal in size with greater than 50%  respiratory variability, suggesting right atrial pressure  of 3 mmHg.  6. Agitated saline contrast bubble study was positive with shunting  observed within 3-6 cardiac cycles suggestive of interatrial shunt. The  right to left shunt is small at rest, moderate following the Valsalva  maneuver.   Comparison(s): No significant change from prior study.  Cor CT 05/2019  ADDENDUM REPORT: 06/11/2019 11:36  CLINICAL DATA:  6F with dyspnea and atypical chest pain.  EXAM: Cardiac/Coronary  CT  TECHNIQUE: The patient was scanned on a [employer].  FINDINGS: A 120 kV prospective scan was triggered in the descending thoracic aorta at 111 HU's. Axial non-contrast 3 mm slices were carried out through the heart. The data set was analyzed on a dedicated work station and scored using the Agatson method. Gantry rotation speed was 250 msecs and collimation was .6 mm. No beta blockade and 0.8 mg of sl NTG was given. The 3D data set was reconstructed in 5% intervals of the 67-82 % of the R-R cycle. Diastolic phases were analyzed on a dedicated work station using MPR, MIP and VRT modes. The patient received 80 cc of contrast.  Aorta: Normal size. Ascending aorta 3.3 cm. No calcifications. No dissection.  Aortic Valve: Trileaflet. Minimal calcification of the aortic annulus.  Coronary Arteries:  Normal coronary origin.  Right dominance.  RCA is a dominant artery that gives rise to PDA and PLVB. There is minimal soft attenuation plaque distally.  Left main is a large artery that gives rise to LAD, LCX, and RI arteries.  LAD is a large vessel that has no plaque. There is a small D1 without plaque.  LCX is a small, non-dominant artery.   There is no plaque.  Other findings:  Normal pulmonary vein drainage into the left atrium.  Normal let atrial appendage without a thrombus.  Small secundum ASD noted.  7.59mm x 3.2 mm.  Normal size of the pulmonary artery.  IMPRESSION: 1. Coronary calcium score of 0. This was 0  percentile for age and sex matched control.  2. Normal coronary origin with right dominance.  3. No evidence of CAD.  4.  Small secundum ASD noted.  Chilton Si, MD    EKG:  EKG is ordered today, personally reviewed, demonstrating NSR 90bpm, nonspecific TW changes in avL, no acute STT changes - essentially normal  Recent Labs: 02/22/2020: ALT 24; B Natriuretic Peptide 40.9; Hemoglobin 14.4; Platelets 250; TSH 1.163 02/23/2020: BUN 15;  Creatinine, Ser 0.80; Potassium 3.6; Sodium 141  Recent Lipid Panel    Component Value Date/Time   CHOL 157 11/03/2009 0000   TRIG 171 11/03/2009 0000   HDL 29 11/03/2009 0000   LDLCALC 94 11/03/2009 0000    PHYSICAL EXAM:    VS:  BP 114/60   Pulse 90   Ht 5\' 5"  (1.651 m)   Wt 185 lb (83.9 kg)   SpO2 96%   BMI 30.79 kg/m   BMI: Body mass index is 30.79 kg/m.  GEN: Well nourished, well developed AAF, in no acute distress HEENT: normocephalic, atraumatic Neck: no JVD, carotid bruits, or masses Cardiac: RRR; no murmurs, rubs, or gallops, no significant edema but prominent varicose veins in both lower extremities Respiratory:  clear to auscultation bilaterally, normal work of breathing GI: soft, nontender, nondistended, + BS MS: no deformity or atrophy Skin: warm and dry, no rash Neuro:  Alert and Oriented x 3, Strength and sensation are intact, follows commands Psych: euthymic mood, full affect  Wt Readings from Last 3 Encounters:  03/03/20 185 lb (83.9 kg)  02/23/20 186 lb 4.6 oz (84.5 kg)  02/11/20 191 lb (86.6 kg)     ASSESSMENT & PLAN:   1. Noncardiac chest pain - improved, not felt to be cardiac in nature, recommend follow-up with primary care for further management. She is also observed to have a persistently elevated WBC of unknown significance. I have recommended she discuss with her PCP. 2. DOE - patient reports this has been going on for one month. Per hospital notes it does not appear further cardiac workup was  warranted but on today's appointment notes the message follow-up "possible outpatient cardiac CTA for ASD." She had a coronary CTA in 05/2019 so I will reach out to Dr. 06/2019 to review her most recent echocardiogram (which Dr. Purvis Sheffield raised question of pulmonary shunt) for further recommendations. Of note, recent BNP was normal and CTA otherwise did not show any evidence of vascular congestion or effusions. At this point would also get the ball rolling for referral to pulmonology for their input. 3. Abnormal echo with intratrial shunt - will discuss with Dr. Eden Emms as above. Addendum: discussed echo with Dr. Purvis Sheffield who does not feel that any further cardiac studies or workup is needed for this finding at this time. He recommends f/u with pulmonology as planned. 4. Essential HTN - BP is 110/60 by personal recheck. She complains of feeling like her legs are swollen. She does not have any marked edema on exam but does have notable varicosities. Amlodipine may exacerbate this sensation so will decrease to 5mg  daily. The patient was instructed to monitor their blood pressure at home and to call if tending to run higher than 130/80 with this medicine change - if that were the case, may be reasonable to increase HCTZ to 25mg  daily with addition of potassium supplementation.  Disposition: F/u with Dr. Purvis Sheffield in 6 months, sooner if needed based on his review of echo.  Medication Adjustments/Labs and Tests Ordered: Current medicines are reviewed at length with the patient today.  Concerns regarding medicines are outlined above. Medication changes, Labs and Tests ordered today are summarized above and listed in the Patient Instructions accessible in Encounters.    Signed, , PA-C  03/03/2020 4:08 PM    [facility] [place] Location in [facility] [address] [place], 10-21-1984 [address] Ph: [phone]; Fax [phone]

## 2020-03-04 ENCOUNTER — Telehealth: Payer: Self-pay | Admitting: Physician Assistant

## 2020-03-04 NOTE — Telephone Encounter (Signed)
I soke with patient, she will speak with pcp about a pulmonary referral

## 2020-03-04 NOTE — Telephone Encounter (Signed)
Please let pt know that I reviewed her recent echo and hospitalization with Dr. Purvis Sheffield and he does not feel any further workup is needed from a heart standpoint. He agrees with plan for pulmonology evaluation. Thank you! Malachi Suderman PA-C

## 2020-04-04 DIAGNOSIS — E1169 Type 2 diabetes mellitus with other specified complication: Secondary | ICD-10-CM | POA: Diagnosis not present

## 2020-04-04 DIAGNOSIS — I10 Essential (primary) hypertension: Secondary | ICD-10-CM | POA: Diagnosis not present

## 2020-04-04 DIAGNOSIS — R079 Chest pain, unspecified: Secondary | ICD-10-CM | POA: Diagnosis not present

## 2020-04-04 DIAGNOSIS — Z794 Long term (current) use of insulin: Secondary | ICD-10-CM | POA: Diagnosis not present

## 2020-04-13 DIAGNOSIS — E1165 Type 2 diabetes mellitus with hyperglycemia: Secondary | ICD-10-CM | POA: Diagnosis not present

## 2020-04-13 DIAGNOSIS — E785 Hyperlipidemia, unspecified: Secondary | ICD-10-CM | POA: Diagnosis not present

## 2020-04-13 DIAGNOSIS — I10 Essential (primary) hypertension: Secondary | ICD-10-CM | POA: Diagnosis not present

## 2020-04-13 DIAGNOSIS — Z794 Long term (current) use of insulin: Secondary | ICD-10-CM | POA: Diagnosis not present

## 2020-04-28 ENCOUNTER — Ambulatory Visit: Payer: Medicare HMO | Admitting: Specialist

## 2020-05-11 ENCOUNTER — Other Ambulatory Visit: Payer: Self-pay

## 2020-05-11 ENCOUNTER — Ambulatory Visit: Payer: Medicare Other | Admitting: Gastroenterology

## 2020-05-11 ENCOUNTER — Encounter: Payer: Self-pay | Admitting: Internal Medicine

## 2020-05-11 ENCOUNTER — Ambulatory Visit: Payer: Medicare Other | Admitting: Internal Medicine

## 2020-05-11 DIAGNOSIS — R0609 Other forms of dyspnea: Secondary | ICD-10-CM

## 2020-05-11 DIAGNOSIS — R0600 Dyspnea, unspecified: Secondary | ICD-10-CM

## 2020-05-11 MED ORDER — PANTOPRAZOLE SODIUM 40 MG PO TBEC: DELAYED_RELEASE_TABLET | ORAL | 2 refills | Status: DC

## 2020-05-11 NOTE — Patient Instructions (Addendum)
Protonix (pantoprazole) 40 mg Take 30- 60 min before your first and last meals of the day   GERD (REFLUX)  is an extremely common cause of respiratory symptoms just like yours  many times with no obvious heartburn at all.    It can be treated with medication, but also with lifestyle changes including elevation of the head of your bed (ideally with 6 -8inch blocks under the headboard of your bed),  Smoking cessation, avoidance of late meals, excessive alcohol, and avoid fatty foods, chocolate, peppermint, colas, red wine, and acidic juices such as orange juice.  NO MINT OR MENTHOL PRODUCTS SO NO COUGH DROPS  USE SUGARLESS CANDY INSTEAD (Jolley ranchers or Stover's or Life Savers) or even ice chips will also do - the key is to swallow to prevent all throat clearing. NO OIL BASED VITAMINS - use powdered substitutes.  Avoid fish oil when coughing.   Make sure you check your oxygen saturations at highest level of activity to be sure it stays over 90% and keep track of it at least once a week, more often if breathing getting worse, and let me know if losing ground.    If losing ground we need to get you scheduling for a cpst but would need to do this in junction with a visit with Dr Prince Rome first    Please schedule a follow up office visit in 6 weeks, call sooner if needed

## 2020-05-11 NOTE — Assessment & Plan Note (Addendum)
Onset 12/2019 perfectly reproducible doe x "one quarter mild to mb" - echo with bubble study 02/23/20 with nl LA and RA Left ventricular ejection fraction, by estimation, is 60 to 65%. The  left ventricle has normal function. The left ventricle has no regional  wall motion abnormalities. There is mild concentric left ventricular  hypertrophy. Left ventricular diastolic parameters were normal.  2. Right ventricular systolic function is normal. The right ventricular  size is normal. Tricuspid regurgitation signal is inadequate for assessing  PA pressure.  3. The mitral valve is normal in structure. No evidence of mitral valve  regurgitation. No evidence of mitral stenosis.  4. The aortic valve is normal in structure. Aortic valve regurgitation is  not visualized. No aortic stenosis is present.  5. The inferior vena cava is normal in size with greater than 50%  respiratory variability, suggesting right atrial pressure of 3 mmHg.  6. Agitated saline contrast bubble study was positive with shunting  observed within 3-6 cardiac cycles suggestive of interatrial shunt. The  right to left shunt is small at rest, moderate following the Valsalva  maneuver.  -  05/11/2020   Walked RA  approx   300 ft  @ very fast pace  stopped due to  End of study, no sob and sats 98% at end  - 05/11/2020 rec max rx for GERD  Symptoms are  disproportionate to objective findings and not clear to what extent this is actually a pulmonary  problem but pt does appear to have difficult to sort out respiratory symptoms of unknown origin for which  DDX  = almost all start with A and  include Adherence, Ace Inhibitors, Acid Reflux, Active Sinus Disease, Alpha 1 Antitripsin deficiency, Anxiety masquerading as Airways dz,  ABPA,  Allergy(esp in young), Aspiration (esp in elderly), Adverse effects of meds,  Active smoking or Vaping, A bunch of PE's/clot burden (a few small clots can't cause this syndrome unless there is already  severe underlying pulm or vascular dz with poor reserve),  Anemia or thyroid disorder, plus two Bs  = Bronchiectasis and Beta blocker use..and one C= CHF   Adherence is always the initial "prime suspect" and is a multilayered concern that requires a "trust but verify" approach in every patient - starting with knowing how to use medications, especially inhalers, correctly, keeping up with refills and understanding the fundamental difference between maintenance and prns vs those medications only taken for a very short course and then stopped and not refilled.  - return with all meds in hand using a trust but verify approach to confirm accurate Medication  Reconciliation The principal here is that until we are certain that the  patients are doing what we've asked, it makes no sense to ask them to do more.   ? Acid (or non-acid) GERD > always difficult to exclude as up to 75% of pts in some series report no assoc GI/ Heartburn symptoms and she has assoc atypical cp > rec max (24h)  acid suppression and diet restrictions/ reviewed and instructions given in writing.   ? Anxiety/depression/ deconditioning > usually at the bottom of this list of usual suspectsand doesn't fit well here due to sudden onset described bu   may interfere with adherence and also interpretation of response or lack thereof to symptom management which can be quite subjective  ? Allergy Dorian Heckle unlikley s assoc rhinitis/ cough    ? Adverse effects of meds > none of the usual suspects listed    ?  Anemia / thyroid dz excluded   ? A bunch of PEs/ TEPAH > excluded by CTa and echo respectively   ? CHF > mild diastolic dysfunction likely but no overt chf    Rec  Make sure you check your oxygen saturations at highest level of activity to be sure it stays over 90% and keep track of it at least once a week, more often if breathing getting worse, and let me know if losing ground.    F/u in 6 weeks, consider CPST next if her R hip/leg  will allow.          Each maintenance medication was reviewed in detail including emphasizing most importantly the difference between maintenance and prns and under what circumstances the prns are to be triggered using an action plan format where appropriate.  Total time for H and P, chart review, counseling,  directly observing portions of ambulatory 02 saturation study/  and generating customized AVS unique to this initial office visit with broad ddx so high level mdm / charting = 68 min

## 2020-05-11 NOTE — Progress Notes (Signed)
[name], female    DOB: July 01, 1951,    MRN: [id number]   Brief patient profile:  72 yobf  Never smoker no h/o allergies/ asthma as child /adult but abruptly around 12/2019 whereas nl walk half a mile daily flat and then the very "next day"  Couldn't make it to mb which is quarter mile with doe/ assoc  cp  > ER with neg cards w/u dx [facility] rx advil no better so referred to pulmonary clinic in [place]  05/11/2020 by Dr   Mirna Mires    R SI joint injected 12/24/19 and  also limited by R hip/leg pain on day of initial eval.   History of Present Illness  05/11/2020  Pulmonary/ 1st office eval/[name] / maint on ppi after breakfast/ chronic dysphagia unexplained with nl egd 11/12/2018 Chief Complaint  Patient presents with  . Consult    Shortness of breath when walking  Dyspnea:  As above/  Worse doe since onset 12/2019 but also now limited by R hip/ leg pain  Cough: no  Sleep: no resp symptoms flat, sometimes gets noct cp sample location = R parasternal s radiation  SABA use: none   No obvious day to day or daytime variability or assoc excess/ purulent sputum or mucus plugs or hemoptysis or chest tightness, subjective wheeze or overt sinus or hb symptoms.   Sleeping most nights  without nocturnal  or early am exacerbation  of respiratory  c/o's or need for noct saba. Also denies any obvious fluctuation of symptoms with weather or environmental changes or other aggravating or alleviating factors except as outlined above   No unusual exposure hx or h/o childhood pna/ asthma or knowledge of premature birth.  Current Allergies, Complete Past Medical History, Past Surgical History, Family History, and Social History were reviewed in [employer] record.  ROS  The following are not active complaints unless bolded Hoarseness, sore throat, dysphagia, dental problems, itching, sneezing,  nasal congestion or discharge of excess mucus or purulent secretions, ear ache,    fever, chills, sweats, unintended wt loss or wt gain, classically pleuritic or exertional cp,  orthopnea pnd or arm/hand swelling  or leg swelling, presyncope, palpitations, abdominal pain, anorexia, nausea, vomiting, diarrhea  or change in bowel habits or change in bladder habits, change in stools or change in urine, dysuria, hematuria,  rash, arthralgias, visual complaints, headache, numbness, weakness or ataxia or problems with walking or coordination,  change in mood or  memory.           Past Medical History:  Diagnosis Date  . Asthma   . Bronchitis   . Diabetes mellitus   . Hyperlipidemia   . Hypertension   . Interatrial cardiac shunt    a. small secundum ASD vs pulmonary shunt.  . Low back pain   . Migraine   . Mild carotid artery disease (HCC)   . Osteoarthritis   . Sciatica   . Varicose veins   . Vertigo     Outpatient Medications Prior to Visit  Medication Sig Dispense Refill  . amLODipine (NORVASC) 5 MG tablet Take 1 tablet (5 mg total) by mouth daily. 90 tablet 3  . aspirin 81 MG chewable tablet Chew 81 mg by mouth daily.    . hydrochlorothiazide (MICROZIDE) 12.5 MG capsule Take 1 capsule (12.5 mg total) by mouth daily. 30 capsule 0  . insulin NPH-insulin regular (NOVOLIN 70/30) (70-30) 100 UNIT/ML injection Inject 67 Units into the skin 2 (two) times  daily.     . linaclotide (LINZESS) 72 MCG capsule Take 1 capsule (72 mcg total) by mouth daily before breakfast. 30 capsule 11  . losartan (COZAAR) 50 MG tablet Take 50 mg by mouth daily.  4  . metFORMIN (GLUCOPHAGE) 1000 MG tablet Take 1,000 mg by mouth at bedtime.    . methocarbamol (ROBAXIN) 500 MG tablet Take 1 tablet (500 mg total) by mouth every 8 (eight) hours as needed for muscle spasms. 270 tablet 3  . nitroGLYCERIN (NITROSTAT) 0.4 MG SL tablet Place 0.4 mg under the tongue every 5 (five) minutes as needed for chest pain. Dissolve one tablet under the tongue every 5 mintues as needed for chest pain.    Letta Pate  VERIO test strip See admin instructions.  2  . pantoprazole (PROTONIX) 40 MG tablet Take 40 mg by mouth daily.    Marland Kitchen RELION INSULIN SYRINGE 1ML/31G 31G X 5/16" 1 ML MISC USE 1 TWICE DAILY AS DIRECTED  2  . simvastatin (ZOCOR) 20 MG tablet Take 20 mg by mouth at bedtime.      . TRADJENTA 5 MG TABS tablet Take 5 mg by mouth daily with breakfast.      No facility-administered medications prior to visit.     Objective:     BP (!) 140/72 (BP Location: Left Arm, Cuff Size: Normal)   Pulse 80   Temp 99.1 F (37.3 C) (Oral)   Ht 5\' 5"  (1.651 m)   Wt 176 lb 6.4 oz (80 kg)   SpO2 98% Comment: Room air  BMI 29.35 kg/m   SpO2: 98 % (Room air)  Wt Readings from Last 3 Encounters:  05/11/20 176 lb 6.4 oz (80 kg)  03/03/20 185 lb (83.9 kg)  02/23/20 186 lb 4.6 oz (84.5 kg)      somber amb bf nad   HEENT : pt wearing mask not removed for exam due to covid -19 concerns.    NECK :  without JVD/Nodes/TM/ nl carotid upstrokes bilaterally   LUNGS: no acc muscle use,  Nl contour chest which is clear to A and P bilaterally without cough on insp or exp maneuvers   CV:  RRR  no s3 or murmur or increase in P2, and no edema   ABD:  soft and nontender with nl inspiratory excursion in the supine position. No bruits or organomegaly appreciated, bowel sounds nl  MS:  Minimally awkward gait/ ext warm without deformities, calf tenderness, cyanosis or clubbing No obvious joint restrictions   SKIN: warm and dry without lesions    NEURO:  alert, approp, nl sensorium with  no motor or cerebellar deficits apparent.     I personally reviewed images and agree with radiology impression as follows:   Chest CTa    02/11/2020 No evidence of pulmonary embolism. No acute intrathoracic abnormalities. Aortic Atherosclerosis (ICD10-I70.0).  Labs ordered/ reviewed:      Chemistry      Component Value Date/Time   NA 141 02/23/2020 0326   K 3.6 02/23/2020 0326   CL 102 02/23/2020 0326   CO2 28  02/23/2020 0326   BUN 15 02/23/2020 0326   CREATININE 0.80 02/23/2020 0326      Component Value Date/Time   CALCIUM 9.5 02/23/2020 0326   ALKPHOS 75 02/22/2020 1158   AST 30 02/22/2020 1158   ALT 24 02/22/2020 1158   BILITOT 0.6 02/22/2020 1158        Lab Results  Component Value Date   WBC 11.5 (H)  02/22/2020   HGB 14.4 02/22/2020   HCT 45.3 02/22/2020   MCV 93.2 02/22/2020   PLT 250 02/22/2020        Lab Results  Component Value Date   TSH 1.163 02/22/2020          Assessment   No problem-specific Assessment & Plan notes found for this encounter.     Sandrea Hughs, MD 05/11/2020

## 2020-05-13 DIAGNOSIS — E785 Hyperlipidemia, unspecified: Secondary | ICD-10-CM | POA: Diagnosis not present

## 2020-05-13 DIAGNOSIS — Z794 Long term (current) use of insulin: Secondary | ICD-10-CM | POA: Diagnosis not present

## 2020-05-13 DIAGNOSIS — E1165 Type 2 diabetes mellitus with hyperglycemia: Secondary | ICD-10-CM | POA: Diagnosis not present

## 2020-05-13 DIAGNOSIS — I10 Essential (primary) hypertension: Secondary | ICD-10-CM | POA: Diagnosis not present

## 2020-05-30 DIAGNOSIS — Z79899 Other long term (current) drug therapy: Secondary | ICD-10-CM | POA: Diagnosis not present

## 2020-05-30 DIAGNOSIS — Z0000 Encounter for general adult medical examination without abnormal findings: Secondary | ICD-10-CM | POA: Diagnosis not present

## 2020-05-30 DIAGNOSIS — Z794 Long term (current) use of insulin: Secondary | ICD-10-CM | POA: Diagnosis not present

## 2020-05-30 DIAGNOSIS — I10 Essential (primary) hypertension: Secondary | ICD-10-CM | POA: Diagnosis not present

## 2020-05-30 DIAGNOSIS — E1169 Type 2 diabetes mellitus with other specified complication: Secondary | ICD-10-CM | POA: Diagnosis not present

## 2020-06-02 ENCOUNTER — Encounter: Payer: Self-pay | Admitting: Specialist

## 2020-06-02 ENCOUNTER — Other Ambulatory Visit: Payer: Self-pay

## 2020-06-02 ENCOUNTER — Ambulatory Visit (INDEPENDENT_AMBULATORY_CARE_PROVIDER_SITE_OTHER): Payer: Medicare Other | Admitting: Specialist

## 2020-06-02 VITALS — BP 153/71 | HR 90 | Ht 65.0 in | Wt 186.0 lb

## 2020-06-02 DIAGNOSIS — M47818 Spondylosis without myelopathy or radiculopathy, sacral and sacrococcygeal region: Secondary | ICD-10-CM

## 2020-06-02 DIAGNOSIS — M4325 Fusion of spine, thoracolumbar region: Secondary | ICD-10-CM | POA: Diagnosis not present

## 2020-06-02 DIAGNOSIS — G629 Polyneuropathy, unspecified: Secondary | ICD-10-CM | POA: Diagnosis not present

## 2020-06-02 DIAGNOSIS — M4326 Fusion of spine, lumbar region: Secondary | ICD-10-CM | POA: Diagnosis not present

## 2020-06-02 MED ORDER — METHOCARBAMOL 500 MG PO TABS: 500.0000 mg | ORAL_TABLET | Freq: Three times a day (TID) | ORAL | 3 refills | Status: DC | PRN

## 2020-06-02 NOTE — Progress Notes (Signed)
Office Visit Note   Patient: [name]           Date of Birth: 05/04/1951           MRN: [id number] Visit Date: 06/02/2020              Requested by: Mirna Mires, MD [address] [place],  [place] [address] PCP: Mirna Mires, MD   Assessment & Plan: Visit Diagnoses:  1. SI joint arthritis   2. Fusion of spine of thoracolumbar region   3. Fusion of spine of lumbar region   4. Neuropathy     Plan:Avoid bending, stooping and avoid lifting weights greater than 10 lbs. Avoid prolong standing and walking. Avoid frequent bending and stooping  No lifting greater than 10 lbs. May use ice or moist heat for pain. Weight loss is of benefit. Handicap license is approved. Call us if the pain recurrs and we would have either Dr. Prince Rome or Dr. [place] Blas secretary/Assistant call to arrange for SI  steroid injection   Follow-Up Instructions: No follow-ups on file.   Orders:  No orders of the defined types were placed in this encounter.  No orders of the defined types were placed in this encounter.     Procedures: No procedures performed   Clinical Data: No additional findings.   Subjective: Chief Complaint  Patient presents with  . Lower Back - Pain, Follow-up    Follow up from right SI injection with [facility] on 12/24/2019    69 year old female with history of lumbar fusions and more recently right SI pain. Underwent right SI joint injection under ultrasound by Dr. Prince Rome and this helped. She is better and does not take pain medication but does take meds for muscle spasm. She uses methocarbamol and it helps a lot. No bowel or bladder difficulty. No pain with cough or sneeze.   Review of Systems  Constitutional: Negative.   HENT: Negative.   Eyes: Negative.   Respiratory: Negative.   Cardiovascular: Negative.   Gastrointestinal: Negative.   Endocrine: Negative.   Genitourinary: Negative.   Musculoskeletal: Negative.   Skin: Negative.     Allergic/Immunologic: Negative.   Neurological: Negative.   Hematological: Negative.   Psychiatric/Behavioral: Negative.      Objective: Vital Signs: BP (!) 153/71   Pulse 90   Ht 5\' 5"  (1.651 m)   Wt 186 lb (84.4 kg)   BMI 30.95 kg/m   Physical Exam Constitutional:      Appearance: She is well-developed.  HENT:     Head: Normocephalic and atraumatic.  Eyes:     Pupils: Pupils are equal, round, and reactive to light.  Pulmonary:     Effort: Pulmonary effort is normal.     Breath sounds: Normal breath sounds.  Abdominal:     General: Bowel sounds are normal.     Palpations: Abdomen is soft.  Musculoskeletal:     Cervical back: Normal range of motion and neck supple.     Lumbar back: Negative right straight leg raise test and negative left straight leg raise test.  Skin:    General: Skin is warm and dry.  Neurological:     Mental Status: She is alert and oriented to person, place, and time.  Psychiatric:        Behavior: Behavior normal.        Thought Content: Thought content normal.        Judgment: Judgment normal.     Back  Exam   Tenderness  The patient is experiencing tenderness in the lumbar.  Range of Motion  Extension: abnormal  Flexion: abnormal  Lateral bend right: abnormal  Lateral bend left: abnormal  Rotation right: abnormal  Rotation left: abnormal   Muscle Strength  Right Quadriceps:  5/5  Left Quadriceps:  5/5  Right Hamstrings:  5/5  Left Hamstrings:  5/5   Tests  Straight leg raise right: negative Straight leg raise left: negative  Reflexes  Patellar: abnormal  Other  Toe walk: normal Heel walk: normal Sensation: normal Erythema: no back redness Scars: present      Specialty Comments:  No specialty comments available.  Imaging: No results found.   PMFS History: Patient Active Problem List   Diagnosis Date Noted  . Acute blood loss as cause of postoperative anemia 03/07/2018    Priority: High    Class: Acute   . Spinal stenosis, lumbar region with neurogenic claudication 03/04/2018    Priority: High    Class: Chronic  . Multilevel degenerative disc disease 03/04/2018    Priority: High    Class: Chronic  . Forestier's disease of thoracolumbar region 03/04/2018    Priority: High    Class: Chronic  . Obesity 02/23/2020  . Chest pain with moderate risk for cardiac etiology 02/22/2020  . DOE (dyspnea on exertion)   . Carotid artery stenosis 01/07/2020  . Constipation 09/03/2019  . Dysphagia 08/11/2018  . Leg swelling 08/11/2018  . Bilateral carotid bruits 08/11/2018  . Abnormal EKG 08/06/2018  . Fusion of spine of thoracolumbar region 03/04/2018  . Pain in right hip 03/18/2017  . Stiffness of joint, not elsewhere classified, other specified site 08/04/2013  . Leg weakness, bilateral 08/04/2013  . Chest pain at rest 05/10/2009  . BENIGN POSITIONAL VERTIGO 07/30/2007  . VARICOSE VEINS, LOWER EXTREMITIES 07/30/2007  . SCIATICA, CHRONIC 07/30/2007  . Diabetes (HCC) 11/18/2006  . Hyperlipidemia 11/18/2006  . MIGRAINE HEADACHE 11/18/2006  . LESION, SCIATIC NERVE 11/18/2006  . Essential hypertension 11/18/2006  . HYPOTENSION, ORTHOSTATIC 11/18/2006  . BRONCHITIS NOS 11/18/2006  . Other emphysema (HCC) 11/18/2006  . ASTHMA 11/18/2006  . OSTEOARTHRITIS 11/18/2006  . LOW BACK PAIN 11/18/2006  . VERTIGO 11/18/2006   Past Medical History:  Diagnosis Date  . Asthma   . Bronchitis   . Diabetes mellitus   . Hyperlipidemia   . Hypertension   . Interatrial cardiac shunt    a. small secundum ASD vs pulmonary shunt.  . Low back pain   . Migraine   . Mild carotid artery disease (HCC)   . Osteoarthritis   . Sciatica   . Varicose veins   . Vertigo     Family History  Problem Relation Age of Onset  . Lung cancer Mother   . Stroke Father   . Heart failure Sister   . Heart failure Sister   . Colon cancer Neg Hx   . Gastric cancer Neg Hx   . Esophageal cancer Neg Hx     Past Surgical  History:  Procedure Laterality Date  . ABDOMINAL HYSTERECTOMY    . BIOPSY  05/12/2019   Procedure: BIOPSY;  Surgeon: West Bali, MD;  Location: [facility];  Service: Endoscopy;;  . CARPAL TUNNEL RELEASE Bilateral   . CESAREAN SECTION    . ESOPHAGOGASTRODUODENOSCOPY (EGD) WITH PROPOFOL N/A 05/12/2019   Procedure: ESOPHAGOGASTRODUODENOSCOPY (EGD) WITH PROPOFOL;  Surgeon: West Bali, MD;  Location: [facility];  Service: Endoscopy;  Laterality: N/A;  12:00pm  .  INGUINAL HERNIA REPAIR Left   . LUMBAR FUSION    . POSTERIOR LUMBAR FUSION 4 LEVEL N/A 03/04/2018   Procedure: Right transforaminal lumbar interbody fusion L1-2, L2-3 Posterior fusion T10, T11, T12, L1, L2 with T 11, T12 pedicle screws, superior sublaminar hooks T10, local bone graft, allograft cancellous chips Vivigen;  Surgeon: Kerrin Champagne, MD;  Location: [facility];  Service: Orthopedics;  Laterality: N/A;  . SAVORY DILATION N/A 05/12/2019   Procedure: SAVORY DILATION;  Surgeon: West Bali, MD;  Location: [facility];  Service: Endoscopy;  Laterality: N/A;   Social History   Occupational History  . Occupation: Disabled    Employer: UNEMPLOYED  Tobacco Use  . Smoking status: Never Smoker  . Smokeless tobacco: Never Used  Vaping Use  . Vaping Use: Never used  Substance and Sexual Activity  . Alcohol use: No    Alcohol/week: 0.0 standard drinks  . Drug use: No  . Sexual activity: Yes    Birth control/protection: Surgical

## 2020-06-02 NOTE — Patient Instructions (Addendum)
Avoid bending, stooping and avoid lifting weights greater than 10 lbs. Avoid prolong standing and walking. Avoid frequent bending and stooping  No lifting greater than 10 lbs. May use ice or moist heat for pain. Weight loss is of benefit. Handicap license is approved. Call us if the pain recurrs and we would have either Dr. Prince Rome or Dr. [place] Blas secretary/Assistant call to arrange for SI  steroid injection

## 2020-06-23 ENCOUNTER — Ambulatory Visit: Payer: Medicare Other | Admitting: Internal Medicine

## 2020-06-24 ENCOUNTER — Ambulatory Visit: Payer: Medicare Other | Admitting: Gastroenterology

## 2020-06-28 DIAGNOSIS — E785 Hyperlipidemia, unspecified: Secondary | ICD-10-CM | POA: Diagnosis not present

## 2020-06-28 DIAGNOSIS — Z7984 Long term (current) use of oral hypoglycemic drugs: Secondary | ICD-10-CM | POA: Diagnosis not present

## 2020-06-28 DIAGNOSIS — E1165 Type 2 diabetes mellitus with hyperglycemia: Secondary | ICD-10-CM | POA: Diagnosis not present

## 2020-06-28 DIAGNOSIS — I10 Essential (primary) hypertension: Secondary | ICD-10-CM | POA: Diagnosis not present

## 2020-08-02 ENCOUNTER — Encounter: Payer: Self-pay | Admitting: Internal Medicine

## 2020-08-02 ENCOUNTER — Other Ambulatory Visit: Payer: Self-pay

## 2020-08-02 ENCOUNTER — Ambulatory Visit: Payer: Medicare Other | Admitting: Internal Medicine

## 2020-08-02 VITALS — BP 132/80 | HR 89 | Temp 97.2°F | Ht 65.0 in | Wt 185.0 lb

## 2020-08-02 DIAGNOSIS — Z23 Encounter for immunization: Secondary | ICD-10-CM

## 2020-08-02 DIAGNOSIS — R0600 Dyspnea, unspecified: Secondary | ICD-10-CM | POA: Diagnosis not present

## 2020-08-02 DIAGNOSIS — R0609 Other forms of dyspnea: Secondary | ICD-10-CM

## 2020-08-02 NOTE — Patient Instructions (Addendum)
Make sure you check your oxygen saturations at highest level of activity (not after your stop) to be sure it stays over 90% and keep track of it at least once a week, more often if breathing getting worse, and let me know if losing ground.   Reduce protonix to Take 30-60 min before first meal of the day to see if it affects your chest discomfort with exertion (or you have chest discomfort with any other pattern)    Pulmonary follow up is as needed

## 2020-08-02 NOTE — Progress Notes (Signed)
[name], female    DOB: 1950-11-20     MRN: [id number]   Brief patient profile:  15 yobf  Never smoker no h/o allergies/ asthma as child /adult but abruptly around 12/2019 whereas nl walk half a mile daily flat and then the very "next day"  Couldn't make it to mb which is quarter mile with doe/ assoc  cp  > ER with neg cards w/u dx mscp rx advil no better so referred to pulmonary clinic in [place]  05/11/2020 by Dr   [name]     R SI joint injected 12/24/19 and  also limited by R hip/leg pain on day of initial eval.   History of Present Illness  05/11/2020  Pulmonary/ 1st office eval/[name] / maint on ppi after breakfast/ chronic dysphagia unexplained with nl egd 11/12/2018 Chief Complaint  Patient presents with  . Consult    Shortness of breath when walking  Dyspnea:  As above/  Worse doe since onset 12/2019 but also now limited by R hip/ leg pain  Cough: no  Sleep: no resp symptoms flat, sometimes gets noct cp sample location = R parasternal s radiation  SABA use: none  recProtonix (pantoprazole) 40 mg Take 30- 60 min before your first and last meals of the day  GERD diet  Make sure you check your oxygen saturations at highest level of activity to be sure it stays over 90% and keep track of it at least once a week, more often if breathing getting worse, and let me know if losing ground.         08/02/2020  f/u ov/[name] re: unexplained doe  Chief Complaint  Patient presents with  . Follow-up    "Breathing is fine". She states that she continues to have soreness in her chest.    Dyspnea:  Gradually improved back to nl activity despite assoc chest "soreness" see below. Cough: none Sleeping: flat bed 2 pillows  SABA use: none  02: none, says sats 97%  Still has some migratory ant chest discomfort with exertion but improves at rest, was radiating to R flank and reported previously  at hs but denies these aspects at present and doesn't remember ever having it except with  exertion (not the same hx she gave cards).      No obvious day to day or daytime variability or assoc excess/ purulent sputum or mucus plugs or hemoptysis or  subjective wheeze or overt sinus or hb symptoms.   sleeping without nocturnal  or early am exacerbation  of respiratory  c/o's or need for noct saba. Also denies any obvious fluctuation of symptoms with weather or environmental changes or other aggravating or alleviating factors except as outlined above   No unusual exposure hx or h/o childhood pna  or knowledge of premature birth.  Current Allergies, Complete Past Medical History, Past Surgical History, Family History, and Social History were reviewed in [employer] record.  ROS  The following are not active complaints unless bolded Hoarseness, sore throat, dysphagia, dental problems, itching, sneezing,  nasal congestion or discharge of excess mucus or purulent secretions, ear ache,   fever, chills, sweats, unintended wt loss or wt gain, classically pleuritic cp,  orthopnea pnd or arm/hand swelling  or leg swelling, presyncope, palpitations, abdominal pain, anorexia, nausea, vomiting, diarrhea  or change in bowel habits or change in bladder habits, change in stools or change in urine, dysuria, hematuria,  rash, arthralgias, visual complaints, headache, numbness, weakness or ataxia  or problems with walking since back surgery or coordination,  change in mood or  memory.        Current Meds  Medication Sig  . amLODipine (NORVASC) 5 MG tablet Take 1 tablet (5 mg total) by mouth daily.  Marland Kitchen aspirin 81 MG chewable tablet Chew 81 mg by mouth daily.  . hydrochlorothiazide (MICROZIDE) 12.5 MG capsule Take 1 capsule (12.5 mg total) by mouth daily.  . insulin NPH-insulin regular (NOVOLIN 70/30) (70-30) 100 UNIT/ML injection Inject 67 Units into the skin 2 (two) times daily.   Marland Kitchen losartan (COZAAR) 50 MG tablet Take 50 mg by mouth daily.  . metFORMIN (GLUCOPHAGE) 1000 MG tablet  Take 1,000 mg by mouth at bedtime.  . methocarbamol (ROBAXIN) 500 MG tablet Take 1 tablet (500 mg total) by mouth every 8 (eight) hours as needed for muscle spasms.  . nitroGLYCERIN (NITROSTAT) 0.4 MG SL tablet Place 0.4 mg under the tongue every 5 (five) minutes as needed for chest pain. Dissolve one tablet under the tongue every 5 mintues as needed for chest pain.  Letta Pate VERIO test strip See admin instructions.  . pantoprazole (PROTONIX) 40 MG tablet Take 30- 60 min before your first and last meals of the day  . RELION INSULIN SYRINGE 1ML/31G 31G X 5/16" 1 ML MISC USE 1 TWICE DAILY AS DIRECTED  . simvastatin (ZOCOR) 20 MG tablet Take 20 mg by mouth at bedtime.                Past Medical History:  Diagnosis Date  . Asthma   . Bronchitis   . Diabetes mellitus   . Hyperlipidemia   . Hypertension   . Interatrial cardiac shunt    a. small secundum ASD vs pulmonary shunt.  . Low back pain   . Migraine   . Mild carotid artery disease (HCC)   . Osteoarthritis   . Sciatica   . Varicose veins   . Vertigo          Objective:     Ambulatory bf nad    08/02/2020   05/11/20 176 lb 6.4 oz (80 kg)  03/03/20 185 lb (83.9 kg)  02/23/20 186 lb 4.6 oz (84.5 kg)    Vital signs reviewed  08/02/2020  - Note at rest 02 sats  100% on RA       HEENT : pt wearing mask not removed for exam due to covid -19 concerns.    NECK :  without JVD/Nodes/TM/ nl carotid upstrokes bilaterally   LUNGS: no acc muscle use,  Nl contour chest which is clear to A and P bilaterally without cough on insp or exp maneuvers   CV:  RRR  no s3 or murmur or increase in P2, and no edema   ABD:  soft and nontender with nl inspiratory excursion in the supine position. No bruits or organomegaly appreciated, bowel sounds nl  MS: Slt awkward gait/ movement of R leg (ever since back surgery remotely) ext warm without deformities, calf tenderness, cyanosis or clubbing No obvious joint restrictions   SKIN:  warm and dry without lesions    NEURO:  alert, approp, nl sensorium with  no motor or cerebellar deficits apparent.         Assessment

## 2020-08-03 ENCOUNTER — Encounter: Payer: Self-pay | Admitting: Internal Medicine

## 2020-08-03 NOTE — Assessment & Plan Note (Addendum)
Onset 12/2019 perfectly reproducible doe x "one quarter mile to mb" - CTa 02/11/20 neg PE/ no findings to explain sob - echo with bubble study 02/23/20 with nl LA and RA Left ventricular ejection fraction, by estimation, is 60 to 65%. The  left ventricle has normal function. The left ventricle has no regional  wall motion abnormalities. There is mild concentric left ventricular  hypertrophy. Left ventricular diastolic parameters were normal.  2. Right ventricular systolic function is normal. The right ventricular  size is normal. Tricuspid regurgitation signal is inadequate for assessing  PA pressure.  3. The mitral valve is normal in structure. No evidence of mitral valve  regurgitation. No evidence of mitral stenosis.  4. The aortic valve is normal in structure. Aortic valve regurgitation is  not visualized. No aortic stenosis is present.  5. The inferior vena cava is normal in size with greater than 50%  respiratory variability, suggesting right atrial pressure of 3 mmHg.  6. Agitated saline contrast bubble study was positive with shunting  observed within 3-6 cardiac cycles suggestive of interatrial shunt. The  right to left shunt is small at rest, moderate following the Valsalva  maneuver.  -  05/11/2020   Walked RA  approx   300 ft  @ very fast pace  stopped due to  End of study, no sob and sats 98% at end  - 05/11/2020 rec max rx for GERD> improved back to baseline by 08/02/2020  Reviewed  cards note from 06/29/20 - her hx has changed and no longer has limiting doe but continues to have cp which only occurs with ex but is somewhat migratory and never supine which is typical of IBS if we've excluded AP in ddx > cards f/u planned, no pulmonary f/u needed   >>> advised  to wean PPI to see what if any chest symptoms flare and if so f/u with GI. Also check oxygen saturations at highest level of activity to be sure it stays over 90% and keep track of it at least once a week, more often if  breathing getting worse, and let me know if losing ground.           Each maintenance medication was reviewed in detail including emphasizing most importantly the difference between maintenance and prns and under what circumstances the prns are to be triggered using an action plan format where appropriate.  Total time for H and P, chart review, counseling, teaching device and generating customized AVS unique to this final summary office visit / charting = 26 min

## 2020-08-06 ENCOUNTER — Encounter: Payer: Self-pay | Admitting: Gastroenterology

## 2020-08-06 NOTE — Progress Notes (Deleted)
Referring Provider: Mirna Mires, MD Primary Care Physician:  Mirna Mires, MD Primary GI Physician: Dr. Marletta Lor.  No chief complaint on file.   HPI:   [name] is a 69 y.o. female presenting today for follow-up.  She has history of constipation and dysphagia.  EGD in July 2020 with normal-appearing esophagus s/p empiric dilation, moderate gastritis due to ASA s/p biopsy, normal examined duodenum.  Biopsies with gastritis.  Modified barium swallow January 2021 within normal limits for it insula state and age.  Suspected possible esophageal dysphagia.  Recommended chopping meats finely.  Last seen in our office in April 2021 she reported pain in the upper chest that was worse with movement and with taking a deep breath x1 month.  Labs and CT angio chest were ordered. CT angio chest with no acute abnormalities.  Suspected chest pain secondary to costochondritis and recommended ibuprofen 3 times daily x7 days.  CBC only with mild elevation of WBC at 11.0 and slight elevation of hematocrit at 46.5, CMP with glucose 226, lipase within normal limits.  Recommended 25-month follow-up.  She canceled her appointments in July and September.  Today:   ?  Colonoscopy   Past Medical History:  Diagnosis Date  . Asthma   . Bronchitis   . Diabetes mellitus   . Hyperlipidemia   . Hypertension   . Interatrial cardiac shunt    a. small secundum ASD vs pulmonary shunt.  . Low back pain   . Migraine   . Mild carotid artery disease (HCC)   . Osteoarthritis   . Sciatica   . Varicose veins   . Vertigo     Past Surgical History:  Procedure Laterality Date  . ABDOMINAL HYSTERECTOMY    . BIOPSY  05/12/2019   Procedure: BIOPSY;  Surgeon: West Bali, MD;  [facility]: [facility];  Service: Endoscopy;;  . CARPAL TUNNEL RELEASE Bilateral   . CESAREAN SECTION    . ESOPHAGOGASTRODUODENOSCOPY (EGD) WITH PROPOFOL N/A 05/12/2019   Procedure: ESOPHAGOGASTRODUODENOSCOPY (EGD) WITH  PROPOFOL;  Surgeon: West Bali, MD;  Location: [facility];  Service: Endoscopy;  Laterality: N/A;  12:00pm  . INGUINAL HERNIA REPAIR Left   . LUMBAR FUSION    . POSTERIOR LUMBAR FUSION 4 LEVEL N/A 03/04/2018   Procedure: Right transforaminal lumbar interbody fusion L1-2, L2-3 Posterior fusion T10, T11, T12, L1, L2 with T 11, T12 pedicle screws, superior sublaminar hooks T10, local bone graft, allograft cancellous chips Vivigen;  Surgeon: Kerrin Champagne, MD;  Location: [facility];  Service: Orthopedics;  Laterality: N/A;  . SAVORY DILATION N/A 05/12/2019   Procedure: SAVORY DILATION;  Surgeon: West Bali, MD;  [facility]: [facility];  Service: Endoscopy;  Laterality: N/A;    Current Outpatient Medications  Medication Sig Dispense Refill  . amLODipine (NORVASC) 5 MG tablet Take 1 tablet (5 mg total) by mouth daily. 90 tablet 3  . aspirin 81 MG chewable tablet Chew 81 mg by mouth daily.    . hydrochlorothiazide (MICROZIDE) 12.5 MG capsule Take 1 capsule (12.5 mg total) by mouth daily. 30 capsule 0  . insulin NPH-insulin regular (NOVOLIN 70/30) (70-30) 100 UNIT/ML injection Inject 67 Units into the skin 2 (two) times daily.     Marland Kitchen losartan (COZAAR) 50 MG tablet Take 50 mg by mouth daily.  4  . metFORMIN (GLUCOPHAGE) 1000 MG tablet Take 1,000 mg by mouth at bedtime.    . methocarbamol (ROBAXIN) 500 MG tablet Take 1 tablet (500  mg total) by mouth every 8 (eight) hours as needed for muscle spasms. 270 tablet 3  . nitroGLYCERIN (NITROSTAT) 0.4 MG SL tablet Place 0.4 mg under the tongue every 5 (five) minutes as needed for chest pain. Dissolve one tablet under the tongue every 5 mintues as needed for chest pain.    Letta Pate VERIO test strip See admin instructions.  2  . pantoprazole (PROTONIX) 40 MG tablet Take 30- 60 min before your first and last meals of the day 60 tablet 2  . RELION INSULIN SYRINGE 1ML/31G 31G X 5/16" 1 ML MISC USE 1 TWICE DAILY AS DIRECTED  2  . simvastatin (ZOCOR) 20 MG  tablet Take 20 mg by mouth at bedtime.       No current facility-administered medications for this visit.    Allergies as of 08/08/2020  . (No Known Allergies)    Family History  Problem Relation Age of Onset  . Lung cancer Mother   . Stroke Father   . Heart failure Sister   . Heart failure Sister   . Colon cancer Neg Hx   . Gastric cancer Neg Hx   . Esophageal cancer Neg Hx     Social History   Socioeconomic History  . Marital status: Single    Spouse name: Not on file  . Number of children: Not on file  . Years of education: 12th   . Highest education level: Not on file  Occupational History  . Occupation: Disabled    Employer: UNEMPLOYED  Tobacco Use  . Smoking status: Never Smoker  . Smokeless tobacco: Never Used  Vaping Use  . Vaping Use: Never used  Substance and Sexual Activity  . Alcohol use: No    Alcohol/week: 0.0 standard drinks  . Drug use: No  . Sexual activity: Yes    Birth control/protection: Surgical  Other Topics Concern  . Not on file  Social History Narrative   No caffeine use. 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 2 CHILDREN: 1 IN [place], 1 IN [employer] CURRENTLY STATIONED IN [place].   Social Determinants of Health   Financial Resource Strain:   . Difficulty of Paying Living Expenses: Not on file  Food Insecurity:   . Worried About Programme researcher, broadcasting/film/video in the Last Year: Not on file  . Ran Out of Food in the Last Year: Not on file  Transportation Needs:   . Lack of Transportation (Medical): Not on file  . Lack of Transportation (Non-Medical): Not on file  Physical Activity:   . Days of Exercise per Week: Not on file  . Minutes of Exercise per Session: Not on file  Stress:   . Feeling of Stress : Not on file  Social Connections:   . Frequency of Communication with Friends and Family: Not on file  . Frequency of Social Gatherings with Friends and Family: Not on file  . Attends Religious Services:  Not on file  . Active Member of Clubs or Organizations: Not on file  . Attends Banker Meetings: Not on file  . Marital Status: Not on file    Review of Systems: Gen: Denies fever, chills, anorexia. Denies fatigue, weakness, weight loss.  CV: Denies chest pain, palpitations, syncope, peripheral edema, and claudication. Resp: Denies dyspnea at rest, cough, wheezing, coughing up blood, and pleurisy. GI: Denies vomiting blood, jaundice, and fecal incontinence.   Denies dysphagia or odynophagia. Derm: Denies rash, itching, dry skin Psych: Denies depression, anxiety, memory loss, confusion.  No homicidal or suicidal ideation.  Heme: Denies bruising, bleeding, and enlarged lymph nodes.  Physical Exam: There were no vitals taken for this visit. General:   Alert and oriented. No distress noted. Pleasant and cooperative.  Head:  Normocephalic and atraumatic. Eyes:  Conjuctiva clear without scleral icterus. Mouth:  Oral mucosa pink and moist. Good dentition. No lesions. Heart:  S1, S2 present without murmurs appreciated. Lungs:  Clear to auscultation bilaterally. No wheezes, rales, or rhonchi. No distress.  Abdomen:  +BS, soft, non-tender and non-distended. No rebound or guarding. No HSM or masses noted. Msk:  Symmetrical without gross deformities. Normal posture. Extremities:  Without edema. Neurologic:  Alert and  oriented x4 Psych:  Alert and cooperative. Normal mood and affect.

## 2020-08-08 ENCOUNTER — Ambulatory Visit: Payer: Medicare Other | Admitting: Gastroenterology

## 2020-08-29 DIAGNOSIS — Z794 Long term (current) use of insulin: Secondary | ICD-10-CM | POA: Diagnosis not present

## 2020-08-29 DIAGNOSIS — E785 Hyperlipidemia, unspecified: Secondary | ICD-10-CM | POA: Diagnosis not present

## 2020-08-29 DIAGNOSIS — E1169 Type 2 diabetes mellitus with other specified complication: Secondary | ICD-10-CM | POA: Diagnosis not present

## 2020-08-29 DIAGNOSIS — I10 Essential (primary) hypertension: Secondary | ICD-10-CM | POA: Diagnosis not present

## 2020-09-12 ENCOUNTER — Ambulatory Visit: Payer: Medicare Other | Admitting: Cardiovascular Disease

## 2020-09-13 DIAGNOSIS — I10 Essential (primary) hypertension: Secondary | ICD-10-CM | POA: Diagnosis not present

## 2020-09-13 DIAGNOSIS — Z794 Long term (current) use of insulin: Secondary | ICD-10-CM | POA: Diagnosis not present

## 2020-09-13 DIAGNOSIS — E785 Hyperlipidemia, unspecified: Secondary | ICD-10-CM | POA: Diagnosis not present

## 2020-09-13 DIAGNOSIS — E1165 Type 2 diabetes mellitus with hyperglycemia: Secondary | ICD-10-CM | POA: Diagnosis not present

## 2020-09-14 ENCOUNTER — Ambulatory Visit: Payer: Medicare Other | Admitting: Cardiology

## 2020-09-17 NOTE — Progress Notes (Signed)
Referring Provider: Mirna Mires, MD Primary Care Physician:  Mirna Mires, MD Primary GI Physician: Dr. Marletta Lor  Chief Complaint  Patient presents with   Dysphagia    reports soreness in chest right side, all the time   Constipation    denies    HPI:   [name] is a 69 y.o. female with a history of dysphagia and constipation. EGD July 2020 with normal appearing esophagus s/p dilation, moderate gastritis due to ASA s/p biopsied revealing chronic gastritis/nonspecific gastropathy with changes suggestive of adjacent ulceration/erosion, negative H. Pylori. Due to coughing with meals, referred for MBSS which was completed January 2021 and wnl. Queried esophageal phase dysphagia. Recommended chopped meats. She presents today for follow-up.   Today:   Constipation: None. BMs daily. No blood in the stool or black stools. Doesn't want a colonoscopy.  Weight is stable.   Dysphagia: No trouble with dysphagia. Chopping meats and chewing well.  Has a soreness in her right chest beside her sternum. Sore to the touch all the time. Certain way she moves or bends, it feels like it is balled up in a knot. Resolves within 5 minutes. This is the same pain she had when she saw Dr. Darrick Penna in April. No nausea or vomiting. GERD is well controlled on Protonix 40 mg daily. No abdominal pain.   No cough. SOB with walking. No SOB at rest. No CP otherwise. No palpitations.   Reports a hernia on the right side of her umbilicus for the last 3 years. Has been worsening.   Past Medical History:  Diagnosis Date   Asthma    Bronchitis    Diabetes mellitus    Hyperlipidemia    Hypertension    Interatrial cardiac shunt    a. small secundum ASD vs pulmonary shunt.   Low back pain    Migraine    Mild carotid artery disease (HCC)    Osteoarthritis    Sciatica    Varicose veins    Vertigo     Past Surgical History:  Procedure Laterality Date   ABDOMINAL HYSTERECTOMY      BIOPSY  05/12/2019   Procedure: BIOPSY;  Surgeon: West Bali, MD;  [facility];  Service: Endoscopy;;   CARPAL TUNNEL RELEASE Bilateral    CESAREAN SECTION     ESOPHAGOGASTRODUODENOSCOPY (EGD) WITH PROPOFOL N/A 05/12/2019   Procedure: ESOPHAGOGASTRODUODENOSCOPY (EGD) WITH PROPOFOL;  Surgeon: West Bali, MD;  normal-appearing esophagus s/p empiric dilation, moderate gastritis due to ASA s/p biopsy, normal examined duodenum.  Biopsies with gastritis.    INGUINAL HERNIA REPAIR Left    LUMBAR FUSION     POSTERIOR LUMBAR FUSION 4 LEVEL N/A 03/04/2018   Procedure: Right transforaminal lumbar interbody fusion L1-2, L2-3 Posterior fusion T10, T11, T12, L1, L2 with T 11, T12 pedicle screws, superior sublaminar hooks T10, local bone graft, allograft cancellous chips Vivigen;  Surgeon: Kerrin Champagne, MD;  Location: [facility];  Service: Orthopedics;  Laterality: N/A;   SAVORY DILATION N/A 05/12/2019   Procedure: SAVORY DILATION;  Surgeon: West Bali, MD;  [facility];  Service: Endoscopy;  Laterality: N/A;    Current Outpatient Medications  Medication Sig Dispense Refill   amLODipine (NORVASC) 5 MG tablet Take 1 tablet (5 mg total) by mouth daily. 90 tablet 3   aspirin 81 MG chewable tablet Chew 81 mg by mouth daily.     hydrochlorothiazide (MICROZIDE) 12.5 MG capsule Take 1 capsule (12.5 mg total) by mouth  daily. 30 capsule 0   insulin NPH-insulin regular (NOVOLIN 70/30) (70-30) 100 UNIT/ML injection Inject 67 Units into the skin 2 (two) times daily.      losartan (COZAAR) 50 MG tablet Take 50 mg by mouth daily.  4   metFORMIN (GLUCOPHAGE) 1000 MG tablet Take 1,000 mg by mouth at bedtime.     nitroGLYCERIN (NITROSTAT) 0.4 MG SL tablet Place 0.4 mg under the tongue every 5 (five) minutes as needed for chest pain. Dissolve one tablet under the tongue every 5 mintues as needed for chest pain.     ONETOUCH VERIO test strip See admin instructions.  2    pantoprazole (PROTONIX) 40 MG tablet Take 30- 60 min before your first and last meals of the day 60 tablet 2   RELION INSULIN SYRINGE 1ML/31G 31G X 5/16" 1 ML MISC USE 1 TWICE DAILY AS DIRECTED  2   Semaglutide (RYBELSUS) 3 MG TABS Take by mouth.     simvastatin (ZOCOR) 20 MG tablet Take 20 mg by mouth at bedtime.       No current facility-administered medications for this visit.    Allergies as of 09/19/2020   (No Known Allergies)    Family History  Problem Relation Age of Onset   Lung cancer Mother    Breast cancer Mother    Stroke Father    Heart failure Sister    Diabetes Sister    Heart failure Sister    Diabetes Sister    Colon cancer Neg Hx    Gastric cancer Neg Hx    Esophageal cancer Neg Hx     Social History   Socioeconomic History   Marital status: Single    Spouse name: Not on file   Number of children: Not on file   Years of education: 12th    Highest education level: Not on file  Occupational History   Occupation: Disabled    Employer: UNEMPLOYED  Tobacco Use   Smoking status: Never Smoker   Smokeless tobacco: Never Used  Building services engineer Use: Never used  Substance and Sexual Activity   Alcohol use: No    Alcohol/week: 0.0 standard drinks   Drug use: No   Sexual activity: Yes    Birth control/protection: Surgical  Other Topics Concern   Not on file  Social History Narrative   No caffeine use. 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 2 CHILDREN: 1 IN [place], 1 IN [employer] CURRENTLY STATIONED IN [place].   Social Determinants of Health   Financial Resource Strain: Not on file  Food Insecurity: Not on file  Transportation Needs: Not on file  Physical Activity: Not on file  Stress: Not on file  Social Connections: Not on file    Review of Systems: Gen: Denies fever, chills, cold or flulike symptoms, presyncope, syncope. CV: See HPI Resp: See HPI GI: See HPI. Heme: See  HPI  Physical Exam: BP (!) 142/78    Pulse 93    Temp (!) 97.3 F (36.3 C)    Ht 5\' 5"  (1.651 m)    Wt 182 lb (82.6 kg)    BMI 30.29 kg/m  General:   Alert and oriented. No distress noted. Pleasant and cooperative.  Head:  Normocephalic and atraumatic. Eyes:  Conjuctiva clear without scleral icterus. Heart:  S1, S2 present without murmurs appreciated. Lungs:  Clear to auscultation bilaterally. No wheezes, rales, or rhonchi. No distress.  Abdomen:  +BS, soft, and non-distended. No rebound or guarding. Right  periumbilical bulge that is soft and reducible. Mild TTP at this site. Central vertical scar extending from umbilicus inferiorly. Msk:  Symmetrical without gross deformities. Normal posture. Extremities:  Without edema. Neurologic:  Alert and  oriented x4 Psych:  Normal mood and affect.

## 2020-09-19 ENCOUNTER — Ambulatory Visit: Payer: Medicare Other | Admitting: Gastroenterology

## 2020-09-19 ENCOUNTER — Other Ambulatory Visit: Payer: Self-pay

## 2020-09-19 ENCOUNTER — Encounter: Payer: Self-pay | Admitting: Gastroenterology

## 2020-09-19 VITALS — BP 142/78 | HR 93 | Temp 97.3°F | Ht 65.0 in | Wt 182.0 lb

## 2020-09-19 DIAGNOSIS — R1310 Dysphagia, unspecified: Secondary | ICD-10-CM

## 2020-09-19 DIAGNOSIS — R1900 Intra-abdominal and pelvic swelling, mass and lump, unspecified site: Secondary | ICD-10-CM | POA: Diagnosis not present

## 2020-09-19 DIAGNOSIS — K2960 Other gastritis without bleeding: Secondary | ICD-10-CM

## 2020-09-19 DIAGNOSIS — K219 Gastro-esophageal reflux disease without esophagitis: Secondary | ICD-10-CM | POA: Diagnosis not present

## 2020-09-19 DIAGNOSIS — K2970 Gastritis, unspecified, without bleeding: Secondary | ICD-10-CM | POA: Insufficient documentation

## 2020-09-19 DIAGNOSIS — R0789 Other chest pain: Secondary | ICD-10-CM

## 2020-09-19 NOTE — Patient Instructions (Addendum)
Have CT of your abdomen completed at [facility]. We will call you with results and further recommendations.   Continue Protonix 40 mg daily.   Continue to chop meats, eat slowly, chew thoroughly, and drink plenty of liquids throughout meals.   I suspect your chest discomfort is secondary to costochondritis. This is inflammation of the cartilage between your ribs and sternum.  You may try taking ibuprofen 400 mg TID x1 week.  Discuss ongoing management with your PCP.   We will follow-up in 1 year. Do not hesitate to call with questions or concerns prior.   Hope you have a great Christmas!  Ermalinda Memos, PA-C [facility]

## 2020-09-21 ENCOUNTER — Ambulatory Visit (HOSPITAL_COMMUNITY): Payer: Medicare Other

## 2020-09-22 DIAGNOSIS — K219 Gastro-esophageal reflux disease without esophagitis: Secondary | ICD-10-CM | POA: Insufficient documentation

## 2020-09-22 NOTE — Assessment & Plan Note (Addendum)
History of dysphagia.  EGD July 2020 with normal-appearing esophagus s/p dilation.  Patient reported coughing with meals and underwent modified barium swallow in January 2021 that was within normal limits.  Today she states she is not having any trouble with dysphagia.  She is chopping her meats and chewing thoroughly.  Advised she continue to chop meat, eat slowly, chew thoroughly, and drink plenty of liquids throughout meals.  She will monitor for return of symptoms.

## 2020-09-22 NOTE — Assessment & Plan Note (Signed)
69 year old female with greater than 7-month history of soreness along the right side of her sternum.  It is tender to the touch and pain is worsened with certain movements.  Feels like a cramp at times that will resolve within 5 minutes.  No GERD symptoms or any other significant upper GI symptoms.  Suspect this is likely secondary to costochondritis/possible arthritis.  Advised that she may try taking ibuprofen 400 mg 3 times daily x1 week and to follow-up with PCP for ongoing management.

## 2020-09-22 NOTE — Assessment & Plan Note (Addendum)
Patient reports having a hernia on the right side of her umbilicus at least for the last 3 years.  Hernia will reduce when she lays down.  Has been worsening/getting larger.  No significant pain in this area.  No prior imaging for evaluation.  On exam, there is a right periumbilical bulge that is soft and reducible with mild tenderness to palpation at this site.  Central vertical scar extending from umbilicus inferiorly.  She has history of C-section, abdominal hysterectomy, and inguinal hernia repair.  Suspect this bulge is likely a hernia.  As there is tenderness, will order a CT scan for further evaluation.  Patient is interested in surgical correction.  We will refer to general surgery once diagnosis is confirmed.

## 2020-09-22 NOTE — Assessment & Plan Note (Signed)
Well-controlled on Protonix 40 mg daily.  No alarm symptoms.  Advised she continue her current medications.  Follow-up in 1 year. 

## 2020-09-23 NOTE — Progress Notes (Signed)
Cc'ed to pcp °

## 2020-09-23 NOTE — Progress Notes (Signed)
Cardiology Office Note   Date:  09/26/2020   ID:  [name], DOB Mar 13, 1951, MRN [id number]  PCP:  Mirna Mires, MD  Cardiologist:  Was Dr. Lonn Georgia     Chief Complaint  Patient presents with  . Chest Pain      History of Present Illness: [name] is a 69 y.o. female who presents for follow up of hx chest pain.   history of mild carotid artery disease (followed by [facility]), HTN, HLD (followed in primary care), DM, asthma, migraines, varicose veins with mild chronic edema, arthritis who presents for post-hospital follow-up.   She has history of coronary CTA in 05/2019 showing no significant CAD or incidental findings, small secundum ASD noted. She recently saw GI and reported inspirational chest pain so CTA was ordered 02/11/20 and was negative for PE or acute abnormalities. She was recommended to take ibuprofen. She was subsequently admitted 02/22/20 with worsening SOB and continued intermittent chest pain, more with exertion and recumbency. hsTroponins were normal, BNP normal, and Covid was negative. LE venous duplex showed no DVT. There was no obvious cardiac etiology for her chest pain. She had an echo performed 02/23/20 to evaluate for the reported ASD on study last year. This showed EF 60-65%, mild LVH, normal RV, no significant valvular disease, bubble study positive within 3-6 cardiac cycles suggestive of intraatrial shunt, right to left shunt is small at rest, moderate following the Valsalva maneuver. This was not felt significantly different than prior study. Dr. Eden Emms felt that this was more likely a pulmonary shunt and not a secundum ASD. No further inpatient cardiac workup was recommended. Labs otherwise personally reviewed showed K 3.6, Cr 0.80, TSH wnl, WBC 11.5, Hgb 14.4, 01/2020 LFTs wnl.  Last visit 02/2020 AT that time her chest pain was overall better. She continued to note dyspnea that occurs both with talking and with exertion. She reports this started  approximately a month ago. She also complains of lower extremity edema which appears mild by examination.   Today- she was been seen by Dr. Sherene Sires and her DOE is better.  She has also seen Gi with her hx of esophagus dilatation. Moderate gastritis.  Her chest discomfort has resolved.  No palpitations. No lightheadedness.   Her GERD is controlled on Protonix.  She is to have CT of her abd.     She does complain of not voiding then trying to void but not much out.  She has some burning.  May have UTI or urinary retention.  She will follow up with Dr. Loleta Chance, to call his office today. Her insulin is almost prohibitive to buy.  She is in the donut hole.  She will discuss alternatives with Dr. Loleta Chance to her 70/30.   Discussed even 10 lb wt loss will help, exercise which she does by walking.    Past Medical History:  Diagnosis Date  . Asthma   . Bronchitis   . Diabetes mellitus   . Hyperlipidemia   . Hypertension   . Interatrial cardiac shunt    a. small secundum ASD vs pulmonary shunt.  . Low back pain   . Migraine   . Mild carotid artery disease (HCC)   . Osteoarthritis   . Sciatica   . Varicose veins   . Vertigo     Past Surgical History:  Procedure Laterality Date  . ABDOMINAL HYSTERECTOMY    . BIOPSY  05/12/2019   Procedure: BIOPSY;  Surgeon: West Bali, MD;  Location: [facility];  Service: Endoscopy;;  . CARPAL TUNNEL RELEASE Bilateral   . CESAREAN SECTION    . ESOPHAGOGASTRODUODENOSCOPY (EGD) WITH PROPOFOL N/A 05/12/2019   Procedure: ESOPHAGOGASTRODUODENOSCOPY (EGD) WITH PROPOFOL;  Surgeon: West BaliFields, Sandi L, MD;  normal-appearing esophagus s/p empiric dilation, moderate gastritis due to ASA s/p biopsy, normal examined duodenum.  Biopsies with gastritis.   . INGUINAL HERNIA REPAIR Left   . LUMBAR FUSION    . POSTERIOR LUMBAR FUSION 4 LEVEL N/A 03/04/2018   Procedure: Right transforaminal lumbar interbody fusion L1-2, L2-3 Posterior fusion T10, T11, T12, L1, L2 with T 11, T12 pedicle  screws, superior sublaminar hooks T10, local bone graft, allograft cancellous chips Vivigen;  Surgeon: Kerrin ChampagneNitka, James E, MD;  Location: [facility] OR;  Service: Orthopedics;  Laterality: N/A;  . SAVORY DILATION N/A 05/12/2019   Procedure: SAVORY DILATION;  Surgeon: West BaliFields, Sandi L, MD;  Location: [facility];  Service: Endoscopy;  Laterality: N/A;     Current Outpatient Medications  Medication Sig Dispense Refill  . amLODipine (NORVASC) 5 MG tablet Take 1 tablet (5 mg total) by mouth daily. 90 tablet 3  . aspirin 81 MG chewable tablet Chew 81 mg by mouth daily.    Marland Kitchen. gabapentin (NEURONTIN) 100 MG capsule     . hydrochlorothiazide (MICROZIDE) 12.5 MG capsule Take 1 capsule (12.5 mg total) by mouth daily. (Patient taking differently: Take 25 mg by mouth daily.) 30 capsule 0  . losartan (COZAAR) 50 MG tablet Take 50 mg by mouth daily.  4  . metFORMIN (GLUCOPHAGE) 1000 MG tablet Take 1,000 mg by mouth at bedtime.    . nitroGLYCERIN (NITROSTAT) 0.4 MG SL tablet Place 0.4 mg under the tongue every 5 (five) minutes as needed for chest pain. Dissolve one tablet under the tongue every 5 mintues as needed for chest pain.    Letta Pate. ONETOUCH VERIO test strip See admin instructions.  2  . pantoprazole (PROTONIX) 40 MG tablet Take 30- 60 min before your first and last meals of the day 60 tablet 2  . RELION INSULIN SYRINGE 1ML/31G 31G X 5/16" 1 ML MISC USE 1 TWICE DAILY AS DIRECTED  2  . Semaglutide (RYBELSUS) 3 MG TABS Take by mouth.    . simvastatin (ZOCOR) 20 MG tablet Take 20 mg by mouth at bedtime.     No current facility-administered medications for this visit.    Allergies:   Patient has no known allergies.    Social History:  The patient  reports that she has never smoked. She has never used smokeless tobacco. She reports that she does not drink alcohol and does not use drugs.   Family History:  The patient's family history includes Breast cancer in her mother; Diabetes in her sister and sister; Heart failure  in her sister and sister; Lung cancer in her mother; Stroke in her father.    ROS:  General:no colds or fevers, no weight changes Skin:no rashes or ulcers HEENT:no blurred vision, no congestion CV:see HPI PUL:see HPI GI:no diarrhea constipation or melena, no indigestion GU:no hematuria, + dysuria and difficulty passing urine. MS:no joint pain, no claudication Neuro:no syncope, no lightheadedness Endo:+ diabetes, no thyroid disease  Wt Readings from Last 3 Encounters:  09/26/20 184 lb (83.5 kg)  09/19/20 182 lb (82.6 kg)  08/02/20 185 lb (83.9 kg)     PHYSICAL EXAM: VS:  BP 138/68   Pulse 88   Ht 5\' 5"  (1.651 m)   Wt 184 lb (83.5 kg)   SpO2 95%  BMI 30.62 kg/m  , BMI Body mass index is 30.62 kg/m. General:Pleasant affect, NAD Skin:Warm and dry, brisk capillary refill HEENT:normocephalic, sclera clear, mucus membranes moist Neck:supple, no JVD, no bruits  Heart:S1S2 RRR without murmur, gallup, rub or click Lungs:clear without rales, rhonchi, or wheezes ZOX:WRUE, non tender, + BS, do not palpate liver spleen or masses Ext:no lower ext edema, 2+ pedal pulses, 2+ radial pulses Neuro:alert and oriented X 3, MAE, follows commands, + facial symmetry    EKG:  EKG is NOT ordered today.    Recent Labs: 02/22/2020: ALT 24; B Natriuretic Peptide 40.9; Hemoglobin 14.4; Platelets 250; TSH 1.163 02/23/2020: BUN 15; Creatinine, Ser 0.80; Potassium 3.6; Sodium 141    Lipid Panel    Component Value Date/Time   CHOL 157 11/03/2009 0000   TRIG 171 11/03/2009 0000   HDL 29 11/03/2009 0000   LDLCALC 94 11/03/2009 0000       Other studies Reviewed: Additional studies/ records that were reviewed today include:  . 2D Echo 02/2020 1. Left ventricular ejection fraction, by estimation, is 60 to 65%. The  left ventricle has normal function. The left ventricle has no regional  wall motion abnormalities. There is mild concentric left ventricular  hypertrophy. Left ventricular  diastolic  parameters were normal.  2. Right ventricular systolic function is normal. The right ventricular  size is normal. Tricuspid regurgitation signal is inadequate for assessing  PA pressure.  3. The mitral valve is normal in structure. No evidence of mitral valve  regurgitation. No evidence of mitral stenosis.  4. The aortic valve is normal in structure. Aortic valve regurgitation is  not visualized. No aortic stenosis is present.  5. The inferior vena cava is normal in size with greater than 50%  respiratory variability, suggesting right atrial pressure of 3 mmHg.  6. Agitated saline contrast bubble study was positive with shunting  observed within 3-6 cardiac cycles suggestive of interatrial shunt. The  right to left shunt is small at rest, moderate following the Valsalva  maneuver.   Comparison(s): No significant change from prior study.  Cor CT 05/2019  ADDENDUM REPORT: 06/11/2019 11:36  CLINICAL DATA: 27F with dyspnea and atypical chest pain.  EXAM: Cardiac/Coronary CT  TECHNIQUE: The patient was scanned on a [employer].  FINDINGS: A 120 kV prospective scan was triggered in the descending thoracic aorta at 111 HU's. Axial non-contrast 3 mm slices were carried out through the heart. The data set was analyzed on a dedicated work station and scored using the Agatson method. Gantry rotation speed was 250 msecs and collimation was .6 mm. No beta blockade and 0.8 mg of sl NTG was given. The 3D data set was reconstructed in 5% intervals of the 67-82 % of the R-R cycle. Diastolic phases were analyzed on a dedicated work station using MPR, MIP and VRT modes. The patient received 80 cc of contrast.  Aorta: Normal size. Ascending aorta 3.3 cm. No calcifications. No dissection.  Aortic Valve: Trileaflet. Minimal calcification of the aortic annulus.  Coronary Arteries: Normal coronary origin. Right dominance.  RCA is a dominant artery that  gives rise to PDA and PLVB. There is minimal soft attenuation plaque distally.  Left main is a large artery that gives rise to LAD, LCX, and RI arteries.  LAD is a large vessel that has no plaque. There is a small D1 without plaque.  LCX is a small, non-dominant artery. There is no plaque.  Other findings:  Normal pulmonary vein drainage into the  left atrium.  Normal let atrial appendage without a thrombus.  Small secundum ASD noted. 7.72mm x 3.2 mm.  Normal size of the pulmonary artery.  IMPRESSION: 1. Coronary calcium score of 0. This was 0 percentile for age and sex matched control.  2. Normal coronary origin with right dominance.  3. No evidence of CAD.  4. Small secundum ASD noted.  Chilton Si, MD      ASSESSMENT AND PLAN:  1.  Non cardiac chest pain, may be chest wall but has resolved.  Her cardiac CTA in 2020 was normal no CAD 2.  DOE now improved.  She saw pulmonary and is better  3.  Abnormal echo with intraatrial shunt, was discussed with Dr. Darrow Bussing. He did not feel any further cardiac studies were needed.    4.  HTN mildly elevated, she will monitor salt and exercise.   5.  DM-2 per IM 6.  UTI vs urinary retention she will check with Dr. Loleta Chance    Current medicines are reviewed with the patient today.  The patient Has no concerns regarding medicines.  The following changes have been made:  See above Labs/ tests ordered today include:see above  Disposition:   FU:  see above  Signed, Nada Boozer, NP  09/26/2020 5:03 PM    [facility] [address] [place], [place], [place]  [address]/ [address] [place], [place] Phone: [phone]; Fax: [phone]  [phone]

## 2020-09-26 ENCOUNTER — Ambulatory Visit: Payer: Medicare Other | Admitting: Cardiology

## 2020-09-26 ENCOUNTER — Encounter: Payer: Self-pay | Admitting: *Deleted

## 2020-09-26 ENCOUNTER — Other Ambulatory Visit: Payer: Self-pay

## 2020-09-26 ENCOUNTER — Encounter: Payer: Self-pay | Admitting: Cardiology

## 2020-09-26 VITALS — BP 138/68 | HR 88 | Ht 65.0 in | Wt 184.0 lb

## 2020-09-26 DIAGNOSIS — I10 Essential (primary) hypertension: Secondary | ICD-10-CM

## 2020-09-26 DIAGNOSIS — R931 Abnormal findings on diagnostic imaging of heart and coronary circulation: Secondary | ICD-10-CM

## 2020-09-26 DIAGNOSIS — R0600 Dyspnea, unspecified: Secondary | ICD-10-CM | POA: Diagnosis not present

## 2020-09-26 DIAGNOSIS — R0789 Other chest pain: Secondary | ICD-10-CM | POA: Diagnosis not present

## 2020-09-26 DIAGNOSIS — R0609 Other forms of dyspnea: Secondary | ICD-10-CM

## 2020-09-26 NOTE — Patient Instructions (Signed)
Medication Instructions:  Your physician recommends that you continue on your current medications as directed. Please refer to the Current Medication list given to you today.  *If you need a refill on your cardiac medications before your next appointment, please call your pharmacy*   Lab Work: None today If you have labs (blood work) drawn today and your tests are completely normal, you will receive your results only by: Marland Kitchen MyChart Message (if you have MyChart) OR . A paper copy in the mail If you have any lab test that is abnormal or we need to change your treatment, we will call you to review the results.   Testing/Procedures: None today   Follow-Up: At [facility], you and your health needs are our priority.  As part of our continuing mission to provide you with exceptional heart care, we have created designated Provider Care Teams.  These Care Teams include your primary Cardiologist (physician) and Advanced Practice Providers (APPs -  Physician Assistants and Nurse Practitioners) who all work together to provide you with the care you need, when you need it.  We recommend signing up for the patient portal called "MyChart".  Sign up information is provided on this After Visit Summary.  MyChart is used to connect with patients for Virtual Visits (Telemedicine).  Patients are able to view lab/test results, encounter notes, upcoming appointments, etc.  Non-urgent messages can be sent to your provider as well.   To learn more about what you can do with MyChart, go to [email or web address].    Your next appointment:   6 month(s)  The format for your next appointment:   In Person  Provider:   You will see one of the following Advanced Practice Providers on your designated Care Team:    Randall An, PA-C   Jacolyn Reedy, PA-C       Other Instructions None      Thank you for choosing [facility] !

## 2020-10-13 ENCOUNTER — Ambulatory Visit (HOSPITAL_COMMUNITY)
Admission: RE | Admit: 2020-10-13 | Discharge: 2020-10-13 | Disposition: A | Discharge status: Home / Self Care | Payer: Medicare Other | Source: Ambulatory Visit | Attending: Gastroenterology | Admitting: Gastroenterology

## 2020-10-13 ENCOUNTER — Other Ambulatory Visit: Payer: Self-pay

## 2020-10-13 DIAGNOSIS — R1900 Intra-abdominal and pelvic swelling, mass and lump, unspecified site: Secondary | ICD-10-CM | POA: Insufficient documentation

## 2020-10-13 DIAGNOSIS — M4317 Spondylolisthesis, lumbosacral region: Secondary | ICD-10-CM | POA: Diagnosis not present

## 2020-10-13 DIAGNOSIS — Z981 Arthrodesis status: Secondary | ICD-10-CM | POA: Diagnosis not present

## 2020-10-13 DIAGNOSIS — I700 Atherosclerosis of aorta: Secondary | ICD-10-CM | POA: Diagnosis not present

## 2020-10-13 DIAGNOSIS — K429 Umbilical hernia without obstruction or gangrene: Secondary | ICD-10-CM | POA: Diagnosis not present

## 2020-10-13 LAB — POCT I-STAT CREATININE: Creatinine, Ser: 0.9 mg/dL (ref 0.44–1.00)

## 2020-10-13 MED ORDER — IOHEXOL 300 MG/ML  SOLN
80.0000 mL | Freq: Once | INTRAMUSCULAR | Status: AC | PRN
  Administered 2020-10-13: 80 mL via INTRAVENOUS

## 2020-10-14 DIAGNOSIS — I10 Essential (primary) hypertension: Secondary | ICD-10-CM | POA: Diagnosis not present

## 2020-10-14 DIAGNOSIS — E1165 Type 2 diabetes mellitus with hyperglycemia: Secondary | ICD-10-CM | POA: Diagnosis not present

## 2020-10-14 DIAGNOSIS — Z794 Long term (current) use of insulin: Secondary | ICD-10-CM | POA: Diagnosis not present

## 2020-10-14 DIAGNOSIS — E785 Hyperlipidemia, unspecified: Secondary | ICD-10-CM | POA: Diagnosis not present

## 2020-10-20 ENCOUNTER — Other Ambulatory Visit: Payer: Self-pay

## 2020-10-20 DIAGNOSIS — R1900 Intra-abdominal and pelvic swelling, mass and lump, unspecified site: Secondary | ICD-10-CM

## 2020-10-27 ENCOUNTER — Encounter: Payer: Self-pay | Admitting: General Surgery

## 2020-10-27 ENCOUNTER — Ambulatory Visit: Payer: Medicare Other | Admitting: General Surgery

## 2020-10-27 ENCOUNTER — Other Ambulatory Visit: Payer: Self-pay

## 2020-10-27 VITALS — BP 124/76 | HR 85 | Temp 98.1°F | Resp 14 | Ht 65.0 in | Wt 188.0 lb

## 2020-10-27 DIAGNOSIS — Q433 Congenital malformations of intestinal fixation: Secondary | ICD-10-CM

## 2020-10-27 DIAGNOSIS — K439 Ventral hernia without obstruction or gangrene: Secondary | ICD-10-CM | POA: Diagnosis not present

## 2020-10-27 NOTE — Patient Instructions (Signed)

## 2020-10-27 NOTE — Progress Notes (Signed)
[facility] History and Physical  Reason for Referral: Ventral hernia  Referring Physician: Ermalinda Memos PA   Chief Complaint    Follow-up      [name] is a 70 y.o. female.  HPI: [name] is a 70 yo who has had some intermittent constipation and reflux symptoms and was noted to have a ventral hernia on exam by GI. She has been worked up with EGD 2020 that was essentially normal and modified barium swallow that was normal. She also reported to GI right sided chest pain and they referred her to cardiology.   She described an enlarging and worsening bulge in her abdomen mostly on the right abdomen. A CT was done of the abdomen only that incidentally showed malrotation with no obstruction and also demonstrated diastasis recti with likely hernia inferior but the study was incomplete.    Past Medical History:  Diagnosis Date  . Asthma   . Bronchitis   . Diabetes mellitus   . Hyperlipidemia   . Hypertension   . Interatrial cardiac shunt    a. small secundum ASD vs pulmonary shunt.  . Low back pain   . Migraine   . Mild carotid artery disease (HCC)   . Osteoarthritis   . Sciatica   . Varicose veins   . Vertigo     Past Surgical History:  Procedure Laterality Date  . ABDOMINAL HYSTERECTOMY    . BIOPSY  05/12/2019   Procedure: BIOPSY;  Surgeon: West Bali, MD;  [facility];  Service: Endoscopy;;  . CARPAL TUNNEL RELEASE Bilateral   . CESAREAN SECTION    . ESOPHAGOGASTRODUODENOSCOPY (EGD) WITH PROPOFOL N/A 05/12/2019   Procedure: ESOPHAGOGASTRODUODENOSCOPY (EGD) WITH PROPOFOL;  Surgeon: West Bali, MD;  normal-appearing esophagus s/p empiric dilation, moderate gastritis due to ASA s/p biopsy, normal examined duodenum.  Biopsies with gastritis.   . INGUINAL HERNIA REPAIR Left   . LUMBAR FUSION    . POSTERIOR LUMBAR FUSION 4 LEVEL N/A 03/04/2018   Procedure: Right transforaminal lumbar interbody fusion L1-2, L2-3 Posterior  fusion T10, T11, T12, L1, L2 with T 11, T12 pedicle screws, superior sublaminar hooks T10, local bone graft, allograft cancellous chips Vivigen;  Surgeon: Kerrin Champagne, MD;  Location: [facility];  Service: Orthopedics;  Laterality: N/A;  . SAVORY DILATION N/A 05/12/2019   Procedure: SAVORY DILATION;  Surgeon: West Bali, MD;  [facility];  Service: Endoscopy;  Laterality: N/A;    Family History  Problem Relation Age of Onset  . Lung cancer Mother   . Breast cancer Mother   . Stroke Father   . Heart failure Sister   . Diabetes Sister   . Heart failure Sister   . Diabetes Sister   . Colon cancer Neg Hx   . Gastric cancer Neg Hx   . Esophageal cancer Neg Hx     Social History   Tobacco Use  . Smoking status: Never Smoker  . Smokeless tobacco: Never Used  Vaping Use  . Vaping Use: Never used  Substance Use Topics  . Alcohol use: No    Alcohol/week: 0.0 standard drinks  . Drug use: No    Medications: I have reviewed the patient's current medications. Allergies as of 10/27/2020   No Known Allergies     Medication List       Accurate as of October 27, 2020 11:59 PM. If you have any questions, ask your nurse or doctor.  amLODipine 5 MG tablet Commonly known as: NORVASC Take 1 tablet (5 mg total) by mouth daily.   aspirin 81 MG chewable tablet Chew 81 mg by mouth daily.   gabapentin 100 MG capsule Commonly known as: NEURONTIN   hydrochlorothiazide 12.5 MG capsule Commonly known as: MICROZIDE Take 1 capsule (12.5 mg total) by mouth daily. What changed: how much to take   losartan 50 MG tablet Commonly known as: COZAAR Take 50 mg by mouth daily.   metFORMIN 1000 MG tablet Commonly known as: GLUCOPHAGE Take 1,000 mg by mouth at bedtime.   nitroGLYCERIN 0.4 MG SL tablet Commonly known as: NITROSTAT Place 0.4 mg under the tongue every 5 (five) minutes as needed for chest pain. Dissolve one tablet under the tongue every 5 mintues as needed for  chest pain.   OneTouch Verio test strip Generic drug: glucose blood See admin instructions.   pantoprazole 40 MG tablet Commonly known as: Protonix Take 30- 60 min before your first and last meals of the day   RELION INSULIN SYRINGE 1ML/31G 31G X 5/16" 1 ML Misc Generic drug: Insulin Syringe-Needle U-100 USE 1 TWICE DAILY AS DIRECTED   Rybelsus 3 MG Tabs Generic drug: Semaglutide Take by mouth.   simvastatin 20 MG tablet Commonly known as: ZOCOR Take 20 mg by mouth at bedtime.        ROS:  A comprehensive review of systems was negative except for: Gastrointestinal: positive for abdominal pain, dysphagia and reflux symptoms Musculoskeletal: positive for back pain and joint pain Neurological: positive for numbness  Blood pressure 124/76, pulse 85, temperature 98.1 F (36.7 C), temperature source Other (Comment), resp. rate 14, height 5\' 5"  (1.651 m), weight 188 lb (85.3 kg), SpO2 94 %. Physical Exam Vitals reviewed.  Constitutional:      Appearance: Normal appearance.  HENT:     Head: Normocephalic.     Nose: Nose normal.     Mouth/Throat:     Mouth: Mucous membranes are moist.  Eyes:     Extraocular Movements: Extraocular movements intact.  Cardiovascular:     Rate and Rhythm: Normal rate and regular rhythm.  Pulmonary:     Effort: Pulmonary effort is normal.     Breath sounds: Normal breath sounds.  Abdominal:     General: There is no distension.     Palpations: Abdomen is soft.     Tenderness: There is abdominal tenderness.     Hernia: A hernia is present.     Comments: Diastasis superior and reducible defect right of midline inferior and adjacent to umbilicus, midline scar, minor tenderness  Musculoskeletal:        General: Normal range of motion.     Cervical back: Normal range of motion.  Skin:    General: Skin is warm.  Neurological:     General: No focal deficit present.     Mental Status: She is alert and oriented to person, place, and time.   Psychiatric:        Mood and Affect: Mood normal.        Behavior: Behavior normal.        Thought Content: Thought content normal.        Judgment: Judgment normal.     Results: Personally reviewed and reviewed with Dr. radiology- malrotation with duodenum on left and not going past midline, small bowel in left upper quadrant, diastasis and at the inferior most image defect noted measuring at least 6cm  CLINICAL DATA:  Abdominal pain, suspected  hernia. Reducible periumbilical abdominal wall hernia suspected in this 70 year old female.  EXAM: CT ABDOMEN WITH CONTRAST  TECHNIQUE: Multidetector CT imaging of the abdomen was performed using the standard protocol following bolus administration of intravenous contrast.  CONTRAST:  32mL OMNIPAQUE IOHEXOL 300 MG/ML  SOLN  COMPARISON:  CT of the chest from April of 2021  FINDINGS: Lower chest: Basilar scarring at the RIGHT medial lung base. No effusion. No consolidative changes.  Hepatobiliary: No focal, suspicious hepatic lesion. Post cholecystectomy without unexpected biliary duct dilation.  Pancreas: Normal  Spleen: Normal  Adrenals/Urinary Tract: Adrenal glands are normal. The no hydronephrosis. The no suspicious renal lesion. Symmetric renal enhancement.  Stomach/Bowel: No sign of acute gastrointestinal process with evidence of gastrointestinal malrotation. Small bowel loops are found in the RIGHT hemiabdomen. The large bowel is in the LEFT hemiabdomen. Cecum is not imaged as the pelvis is not evaluated.  Vascular/Lymphatic: Calcified and noncalcified atheromatous plaque of the abdominal aorta. No aneurysmal dilation. There is no gastrohepatic or hepatoduodenal ligament lymphadenopathy. No retroperitoneal or mesenteric lymphadenopathy.  Other: No ascites.  No free air.  Pelvis not imaged.  Thinning of the anterior abdominal wall musculature with marked rectus diastasis. Suggestion of hernia  just off the field of view. Abdomen image through the upper pelvis. This is suspected to represent the area of the umbilicus. No visible herniation of bowel on submitted images beyond the abdominal wall but much of the hernias likely below the field of view of current images. Diastatic rectus musculature at this level approximately 11 cm greatest with on the last image of the axial data set, series 2.  Musculoskeletal: Spinal degenerative changes. Extensive lumbosacral spinal fusion and laminectomy spinal fusion extending from T10 through S1. Grade 1 anterolisthesis of L5 on S1 similar to previous imaging. The laminectomy changes extending from L1 through S1.  IMPRESSION: 1. Large hernia just below the field-of-view, diastatic rectus musculature with 11 cm gap just above the umbilicus or at the umbilicus. Suspect the bulk of the hernia is below the field of view actually overlying pelvis rather than the abdomen. 2. No sign of acute gastrointestinal process with evidence of gastrointestinal malrotation. 3. Extensive lumbosacral spinal fusion and laminectomy. 4. Aortic atherosclerosis.  Aortic Atherosclerosis (ICD10-I70.0).   Electronically Signed   By: Donzetta Kohut M.D.   On: 10/14/2020 07:56  Assessment & Plan:  KENNAH HEHR is a 70 y.o. female with significant diastasis recti and some degree  Of hernia inferior to this that needs additional imaging. She has incidental finding of malrotation. I have reviewed the literature and have discussed with the patient. She does have some intermittent constipation but otherwise is well nourished and has never had any symptoms of malrotation. Ladds procedure would not be a small endeavor and she has a large midline scar from her prior C sections.   - Will get CT a/p to further look at the hernia  - Will discuss case with other surgeons to see their thoughts about elective procedure to correct the malrotation versus observation  given limited symptoms  - Will see her back after the CT, may need to post pone the future appt if CT is not done yet   Future Appointments  Date Time Provider Department Center  11/10/2020  2:45 PM Lucretia Roers, MD [facility] None     All questions were answered to the satisfaction of the patient.     Lucretia Roers 11/01/2020, 6:05 PM

## 2020-11-10 ENCOUNTER — Ambulatory Visit: Payer: Medicare Other | Admitting: General Surgery

## 2020-11-25 ENCOUNTER — Other Ambulatory Visit: Payer: Self-pay

## 2020-11-25 ENCOUNTER — Ambulatory Visit (HOSPITAL_COMMUNITY)
Admission: RE | Admit: 2020-11-25 | Discharge: 2020-11-25 | Disposition: A | Discharge status: Home / Self Care | Payer: Medicare Other | Source: Ambulatory Visit | Attending: General Surgery | Admitting: General Surgery

## 2020-11-25 DIAGNOSIS — K4090 Unilateral inguinal hernia, without obstruction or gangrene, not specified as recurrent: Secondary | ICD-10-CM | POA: Diagnosis not present

## 2020-11-25 DIAGNOSIS — K439 Ventral hernia without obstruction or gangrene: Secondary | ICD-10-CM

## 2020-11-25 DIAGNOSIS — Q433 Congenital malformations of intestinal fixation: Secondary | ICD-10-CM | POA: Insufficient documentation

## 2020-11-25 DIAGNOSIS — I700 Atherosclerosis of aorta: Secondary | ICD-10-CM | POA: Diagnosis not present

## 2020-11-25 DIAGNOSIS — Z981 Arthrodesis status: Secondary | ICD-10-CM | POA: Diagnosis not present

## 2020-11-25 LAB — POCT I-STAT CREATININE: Creatinine, Ser: 0.9 mg/dL (ref 0.44–1.00)

## 2020-11-25 MED ORDER — IOHEXOL 300 MG/ML  SOLN
100.0000 mL | Freq: Once | INTRAMUSCULAR | Status: AC | PRN
  Administered 2020-11-25: 100 mL via INTRAVENOUS

## 2020-12-08 ENCOUNTER — Other Ambulatory Visit: Payer: Self-pay

## 2020-12-08 ENCOUNTER — Ambulatory Visit: Payer: Medicare Other | Admitting: General Surgery

## 2020-12-08 ENCOUNTER — Encounter: Payer: Self-pay | Admitting: General Surgery

## 2020-12-08 VITALS — BP 114/58 | HR 75 | Temp 97.7°F | Resp 16 | Ht 65.0 in | Wt 190.0 lb

## 2020-12-08 DIAGNOSIS — K5900 Constipation, unspecified: Secondary | ICD-10-CM | POA: Diagnosis not present

## 2020-12-08 DIAGNOSIS — K432 Incisional hernia without obstruction or gangrene: Secondary | ICD-10-CM | POA: Insufficient documentation

## 2020-12-08 DIAGNOSIS — K439 Ventral hernia without obstruction or gangrene: Secondary | ICD-10-CM | POA: Diagnosis not present

## 2020-12-08 DIAGNOSIS — Q433 Congenital malformations of intestinal fixation: Secondary | ICD-10-CM | POA: Diagnosis not present

## 2020-12-08 NOTE — Progress Notes (Signed)
Pt aware.

## 2020-12-08 NOTE — Patient Instructions (Signed)
Try to take metamucil/ benefiber to get your bowels going. Take it everyday. Drink 64 ounces of water daily. Try miralax to go if the fiber does not work.  See if this helps the sounds of your bowels.

## 2020-12-08 NOTE — Progress Notes (Signed)
[facility] Note   HPI:  70 y.o. Female presents to clinic for follow-up evaluation of her abdominal bulge/ hernia. She says her main complaint is gurgling in her abdomen she has in public and that it is embarrassing. She is eating and drinking fine. She does have constipation that is worse in the last several years but she does not take anything for it. She will have 2 BMs in a day and maybe go 2 weeks before having hard stool followed by loose stool.  She says she drinks water.\ Review of Systems:  No pain at hernia site Constipation  All other review of systems: otherwise negative   Vital Signs:  BP (!) 114/58   Pulse 75   Temp 97.7 F (36.5 C) (Other (Comment))   Resp 16   Ht 5\' 5"  (1.651 m)   Wt 190 lb (86.2 kg)   SpO2 95%   BMI 31.62 kg/m    Physical Exam:  Physical Exam Vitals reviewed.  Cardiovascular:     Rate and Rhythm: Normal rate.  Pulmonary:     Effort: Pulmonary effort is normal.  Abdominal:     General: There is no distension.     Palpations: Abdomen is soft.     Tenderness: There is abdominal tenderness.     Hernia: A hernia is present.     Comments: Multiple areas her hernia, RLQ reducbile, mild tenderness  Neurological:     Mental Status: She is alert.      Imaging:  Personally reviewed- malrotation present, multiple areas of hernia/ abdominal wall defect, some with fat and some with nonobstructing colon  CLINICAL DATA:  Ventral hernia  EXAM: CT ABDOMEN AND PELVIS WITH CONTRAST  TECHNIQUE: Multidetector CT imaging of the abdomen and pelvis was performed using the standard protocol following bolus administration of intravenous contrast.  CONTRAST:  OMNIPAQUE IOHEXOL 300 MG/ML  SOLN  COMPARISON:  October 13, 2020  FINDINGS: Lower chest: The lung bases are clear. The heart size is normal.  Hepatobiliary: The liver is normal. Status post cholecystectomy.There is no biliary ductal dilation.  Pancreas: Normal  contours without ductal dilatation. No peripancreatic fluid collection.  Spleen: Unremarkable.  Adrenals/Urinary Tract:  --Adrenal glands: Unremarkable.  --Right kidney/ureter: No hydronephrosis or radiopaque kidney stones.  --Left kidney/ureter: No hydronephrosis or radiopaque kidney stones.  --Urinary bladder: Unremarkable.  Stomach/Bowel:  --Stomach/Duodenum: The stomach is unremarkable.  --Small bowel: Again noted is a small bowel malrotation without evidence for an obstruction.  --Colon: Unremarkable.  --Appendix: There is an above average amount of stool throughout the colon. There are scattered colonic diverticula without CT evidence for diverticulitis.  Vascular/Lymphatic: Atherosclerotic calcification is present within the non-aneurysmal abdominal aorta, without hemodynamically significant stenosis.  --No retroperitoneal lymphadenopathy.  --No mesenteric lymphadenopathy.  --No pelvic or inguinal lymphadenopathy.  Reproductive: Status post hysterectomy. No adnexal mass.  Other: Multiple fat containing ventral wall hernias are noted. There is a low midline wide-mouth ventral wall hernia containing portions of the colon without evidence for obstruction. There is a left-sided fat containing inguinal hernia.  Musculoskeletal. Extensive thoracolumbar and sacral fusion hardware is noted throughout the spine. There is no acute compression fracture. Degenerative changes are noted.  IMPRESSION: 1. Multiple fat containing ventral wall hernias are noted. There is a low midline wide-mouth ventral wall hernia containing portions of the colon/cecum without evidence for obstruction. 2. Colonic diverticulosis without CT evidence for diverticulitis. 3. Again noted is a small bowel malrotation without evidence for an obstruction. 4. There  is an above average amount of stool throughout the colon.  Aortic Atherosclerosis  (ICD10-I70.0).   Electronically Signed   By: Katherine Mantle M.D.   On: 11/26/2020 17:47   Assessment:  70 y.o. yo Female with multiple ventral hernia and diastasis and malrotation that was incidentally found. She has constipation but does not take anything for this and has not tried anything. Her main complaint is gurgling of the abdomen. She has never had a colonoscopy and refuses mammograms because she does not want to know if she has any cancer. Her mother died from breast cancer. She says that if she gets cancer this will be they way she is meant to die. Discussed that a large surgery for malrotation and for ventral hernia is a major operation with a lot of risk and potentially no benefit to her gurgling complaint and is a lot to undertake for no obstructive symptoms or pain. Discussed that her hesitancy for screening procedures and treatment for cancer is sort of contradictory to doing major surgery for minor complaints.   Plan:  - Discussed ways to improve constipation as this could be related to diet and water intake versus could be related to the malrotation  - Discussed seeing if constipation improves if the gurgling improves  - Discussed that she does not really want surgery but she wants the gurgling to stop and that this may not change even if we did a surgery   Try to take metamucil/ benefiber to get your bowels going. Take it everyday. Drink 64 ounces of water daily. Try miralax to go if the fiber does not work.  See if this helps the sounds of your bowels.   Future Appointments  Date Time Provider Department Center  01/05/2021  1:00 PM Lucretia Roers, MD [facility] None     [name], MD [facility] [address] [place] R[address] [place], [place] [address] [phone] (office)

## 2020-12-12 DIAGNOSIS — E785 Hyperlipidemia, unspecified: Secondary | ICD-10-CM | POA: Diagnosis not present

## 2020-12-12 DIAGNOSIS — Z794 Long term (current) use of insulin: Secondary | ICD-10-CM | POA: Diagnosis not present

## 2020-12-12 DIAGNOSIS — E1165 Type 2 diabetes mellitus with hyperglycemia: Secondary | ICD-10-CM | POA: Diagnosis not present

## 2020-12-12 DIAGNOSIS — I10 Essential (primary) hypertension: Secondary | ICD-10-CM | POA: Diagnosis not present

## 2020-12-26 DIAGNOSIS — E1169 Type 2 diabetes mellitus with other specified complication: Secondary | ICD-10-CM | POA: Diagnosis not present

## 2020-12-26 DIAGNOSIS — Z7189 Other specified counseling: Secondary | ICD-10-CM | POA: Diagnosis not present

## 2020-12-26 DIAGNOSIS — E785 Hyperlipidemia, unspecified: Secondary | ICD-10-CM | POA: Diagnosis not present

## 2020-12-26 DIAGNOSIS — Z794 Long term (current) use of insulin: Secondary | ICD-10-CM | POA: Diagnosis not present

## 2020-12-26 DIAGNOSIS — I10 Essential (primary) hypertension: Secondary | ICD-10-CM | POA: Diagnosis not present

## 2020-12-26 DIAGNOSIS — M545 Low back pain, unspecified: Secondary | ICD-10-CM | POA: Diagnosis not present

## 2021-01-05 ENCOUNTER — Ambulatory Visit: Payer: Medicare Other | Admitting: General Surgery

## 2021-01-12 DIAGNOSIS — E1165 Type 2 diabetes mellitus with hyperglycemia: Secondary | ICD-10-CM | POA: Diagnosis not present

## 2021-01-12 DIAGNOSIS — I10 Essential (primary) hypertension: Secondary | ICD-10-CM | POA: Diagnosis not present

## 2021-01-12 DIAGNOSIS — Z794 Long term (current) use of insulin: Secondary | ICD-10-CM | POA: Diagnosis not present

## 2021-02-14 ENCOUNTER — Emergency Department (HOSPITAL_COMMUNITY): Payer: Medicare Other

## 2021-02-14 ENCOUNTER — Ambulatory Visit: Payer: Medicare Other | Admitting: General Surgery

## 2021-02-14 ENCOUNTER — Encounter: Payer: Self-pay | Admitting: General Surgery

## 2021-02-14 ENCOUNTER — Encounter (HOSPITAL_COMMUNITY): Payer: Self-pay | Admitting: *Deleted

## 2021-02-14 ENCOUNTER — Observation Stay (HOSPITAL_COMMUNITY)
Admission: EM | Admit: 2021-02-14 | Discharge: 2021-02-15 | Disposition: A | Discharge status: Home / Self Care | Payer: Medicare Other | Attending: Family Medicine | Admitting: Family Medicine

## 2021-02-14 ENCOUNTER — Other Ambulatory Visit: Payer: Self-pay

## 2021-02-14 VITALS — BP 113/73 | HR 93 | Temp 98.1°F | Resp 16 | Ht 65.0 in | Wt 189.0 lb

## 2021-02-14 DIAGNOSIS — E119 Type 2 diabetes mellitus without complications: Secondary | ICD-10-CM

## 2021-02-14 DIAGNOSIS — R071 Chest pain on breathing: Secondary | ICD-10-CM | POA: Diagnosis not present

## 2021-02-14 DIAGNOSIS — Z79899 Other long term (current) drug therapy: Secondary | ICD-10-CM | POA: Insufficient documentation

## 2021-02-14 DIAGNOSIS — Z794 Long term (current) use of insulin: Secondary | ICD-10-CM | POA: Insufficient documentation

## 2021-02-14 DIAGNOSIS — R0609 Other forms of dyspnea: Secondary | ICD-10-CM

## 2021-02-14 DIAGNOSIS — R0602 Shortness of breath: Secondary | ICD-10-CM | POA: Diagnosis not present

## 2021-02-14 DIAGNOSIS — I10 Essential (primary) hypertension: Secondary | ICD-10-CM | POA: Diagnosis not present

## 2021-02-14 DIAGNOSIS — Z20822 Contact with and (suspected) exposure to covid-19: Secondary | ICD-10-CM | POA: Diagnosis not present

## 2021-02-14 DIAGNOSIS — J45909 Unspecified asthma, uncomplicated: Secondary | ICD-10-CM | POA: Diagnosis not present

## 2021-02-14 DIAGNOSIS — Z7982 Long term (current) use of aspirin: Secondary | ICD-10-CM | POA: Diagnosis not present

## 2021-02-14 DIAGNOSIS — Z23 Encounter for immunization: Secondary | ICD-10-CM | POA: Diagnosis not present

## 2021-02-14 DIAGNOSIS — R0600 Dyspnea, unspecified: Secondary | ICD-10-CM | POA: Diagnosis not present

## 2021-02-14 DIAGNOSIS — R0789 Other chest pain: Secondary | ICD-10-CM | POA: Diagnosis not present

## 2021-02-14 DIAGNOSIS — Z7984 Long term (current) use of oral hypoglycemic drugs: Secondary | ICD-10-CM | POA: Insufficient documentation

## 2021-02-14 DIAGNOSIS — R079 Chest pain, unspecified: Secondary | ICD-10-CM | POA: Diagnosis not present

## 2021-02-14 LAB — TROPONIN I (HIGH SENSITIVITY)
Troponin I (High Sensitivity): 8 ng/L (ref <18)
Troponin I (High Sensitivity): 10 ng/L (ref <18)
Troponin I (High Sensitivity): 15 ng/L (ref <18)

## 2021-02-14 LAB — CBC WITH DIFFERENTIAL/PLATELET
Abs Immature Granulocytes: 0.07 10*3/uL (ref 0.00–0.07)
Basophils Absolute: 0.1 10*3/uL (ref 0.0–0.1)
Basophils Relative: 0 %
Eosinophils Absolute: 0.2 10*3/uL (ref 0.0–0.5)
Eosinophils Relative: 1 %
HCT: 46.2 % — ABNORMAL HIGH (ref 36.0–46.0)
Hemoglobin: 15.1 g/dL — ABNORMAL HIGH (ref 12.0–15.0)
Immature Granulocytes: 1 %
Lymphocytes Relative: 32 %
Lymphs Abs: 4 10*3/uL (ref 0.7–4.0)
MCH: 30.1 pg (ref 26.0–34.0)
MCHC: 32.7 g/dL (ref 30.0–36.0)
MCV: 92.2 fL (ref 80.0–100.0)
Monocytes Absolute: 0.8 10*3/uL (ref 0.1–1.0)
Monocytes Relative: 6 %
Neutro Abs: 7.4 10*3/uL (ref 1.7–7.7)
Neutrophils Relative %: 60 %
Platelets: 249 10*3/uL (ref 150–400)
RBC: 5.01 MIL/uL (ref 3.87–5.11)
RDW: 13.2 % (ref 11.5–15.5)
WBC: 12.4 10*3/uL — ABNORMAL HIGH (ref 4.0–10.5)
nRBC: 0 % (ref 0.0–0.2)

## 2021-02-14 LAB — COMPREHENSIVE METABOLIC PANEL
ALT: 21 U/L (ref 0–44)
AST: 25 U/L (ref 15–41)
Albumin: 4.5 g/dL (ref 3.5–5.0)
Alkaline Phosphatase: 71 U/L (ref 38–126)
Anion gap: 12 (ref 5–15)
BUN: 13 mg/dL (ref 8–23)
CO2: 27 mmol/L (ref 22–32)
Calcium: 9.9 mg/dL (ref 8.9–10.3)
Chloride: 97 mmol/L — ABNORMAL LOW (ref 98–111)
Creatinine, Ser: 0.92 mg/dL (ref 0.44–1.00)
GFR, Estimated: >60 mL/min (ref >60)
Glucose, Bld: 234 mg/dL — ABNORMAL HIGH (ref 70–99)
Potassium: 3.7 mmol/L (ref 3.5–5.1)
Sodium: 136 mmol/L (ref 135–145)
Total Bilirubin: 0.8 mg/dL (ref 0.3–1.2)
Total Protein: 7.9 g/dL (ref 6.5–8.1)

## 2021-02-14 LAB — RESP PANEL BY RT-PCR (FLU A&B, COVID) ARPGX2
Influenza A by PCR: NEGATIVE
Influenza B by PCR: NEGATIVE
SARS Coronavirus 2 by RT PCR: NEGATIVE

## 2021-02-14 LAB — D-DIMER, QUANTITATIVE: D-Dimer, Quant: 0.44 ug/mL-FEU (ref 0.00–0.50)

## 2021-02-14 LAB — BRAIN NATRIURETIC PEPTIDE: B Natriuretic Peptide: 18 pg/mL (ref 0.0–100.0)

## 2021-02-14 LAB — GLUCOSE, CAPILLARY: Glucose-Capillary: 201 mg/dL — ABNORMAL HIGH (ref 70–99)

## 2021-02-14 MED ORDER — ACETAMINOPHEN 650 MG RE SUPP: 650.0000 mg | Freq: Four times a day (QID) | RECTAL | Status: DC | PRN

## 2021-02-14 MED ORDER — SIMVASTATIN 20 MG PO TABS
20.0000 mg | ORAL_TABLET | Freq: Every day | ORAL | Status: DC
  Administered 2021-02-14: 20 mg via ORAL
  Filled 2021-02-14: qty 1

## 2021-02-14 MED ORDER — ACETAMINOPHEN 325 MG PO TABS: 650.0000 mg | ORAL_TABLET | Freq: Four times a day (QID) | ORAL | Status: DC | PRN

## 2021-02-14 MED ORDER — LORAZEPAM 2 MG/ML IJ SOLN
0.5000 mg | Freq: Once | INTRAMUSCULAR | Status: AC
  Administered 2021-02-14: 0.5 mg via INTRAVENOUS
  Filled 2021-02-14: qty 1

## 2021-02-14 MED ORDER — GABAPENTIN 100 MG PO CAPS
100.0000 mg | ORAL_CAPSULE | Freq: Every day | ORAL | Status: DC
  Administered 2021-02-15: 100 mg via ORAL
  Filled 2021-02-14: qty 1

## 2021-02-14 MED ORDER — LOSARTAN POTASSIUM 50 MG PO TABS
50.0000 mg | ORAL_TABLET | Freq: Every day | ORAL | Status: DC
  Administered 2021-02-15: 50 mg via ORAL
  Filled 2021-02-14: qty 1

## 2021-02-14 MED ORDER — SODIUM CHLORIDE 0.9 % IV BOLUS
500.0000 mL | Freq: Once | INTRAVENOUS | Status: AC
  Administered 2021-02-14: 500 mL via INTRAVENOUS

## 2021-02-14 MED ORDER — ALUM & MAG HYDROXIDE-SIMETH 200-200-20 MG/5ML PO SUSP
30.0000 mL | ORAL | Status: DC | PRN
  Administered 2021-02-14: 30 mL via ORAL
  Filled 2021-02-14: qty 30

## 2021-02-14 MED ORDER — ONDANSETRON HCL 4 MG PO TABS: 4.0000 mg | ORAL_TABLET | Freq: Four times a day (QID) | ORAL | Status: DC | PRN

## 2021-02-14 MED ORDER — INSULIN ASPART 100 UNIT/ML IJ SOLN
0.0000 [IU] | Freq: Every day | INTRAMUSCULAR | Status: DC
  Administered 2021-02-14: 2 [IU] via SUBCUTANEOUS

## 2021-02-14 MED ORDER — PNEUMOCOCCAL VAC POLYVALENT 25 MCG/0.5ML IJ INJ
0.5000 mL | INJECTION | Freq: Once | INTRAMUSCULAR | Status: AC
  Administered 2021-02-15: 0.5 mL via INTRAMUSCULAR
  Filled 2021-02-14: qty 0.5

## 2021-02-14 MED ORDER — AMLODIPINE BESYLATE 5 MG PO TABS
10.0000 mg | ORAL_TABLET | Freq: Every day | ORAL | Status: DC
  Administered 2021-02-15: 10 mg via ORAL
  Filled 2021-02-14: qty 2

## 2021-02-14 MED ORDER — INSULIN ASPART 100 UNIT/ML IJ SOLN
0.0000 [IU] | Freq: Three times a day (TID) | INTRAMUSCULAR | Status: DC
  Administered 2021-02-15 (×2): 3 [IU] via SUBCUTANEOUS

## 2021-02-14 MED ORDER — MORPHINE SULFATE (PF) 2 MG/ML IV SOLN
2.0000 mg | INTRAVENOUS | Status: DC | PRN
  Administered 2021-02-14 – 2021-02-15 (×3): 2 mg via INTRAVENOUS
  Filled 2021-02-14 (×3): qty 1

## 2021-02-14 MED ORDER — POLYETHYLENE GLYCOL 3350 17 G PO PACK: 17.0000 g | PACK | Freq: Every day | ORAL | Status: DC | PRN

## 2021-02-14 MED ORDER — ONDANSETRON HCL 4 MG/2ML IJ SOLN: 4.0000 mg | Freq: Four times a day (QID) | INTRAMUSCULAR | Status: DC | PRN

## 2021-02-14 MED ORDER — ASPIRIN 81 MG PO CHEW
81.0000 mg | CHEWABLE_TABLET | Freq: Every day | ORAL | Status: DC
  Administered 2021-02-15: 81 mg via ORAL
  Filled 2021-02-14: qty 1

## 2021-02-14 MED ORDER — NITROGLYCERIN 0.4 MG SL SUBL: 0.4000 mg | SUBLINGUAL_TABLET | SUBLINGUAL | Status: DC | PRN

## 2021-02-14 MED ORDER — ENOXAPARIN SODIUM 40 MG/0.4ML IJ SOSY
40.0000 mg | PREFILLED_SYRINGE | INTRAMUSCULAR | Status: DC
  Administered 2021-02-14: 40 mg via SUBCUTANEOUS
  Filled 2021-02-14: qty 0.4

## 2021-02-14 MED ORDER — PANTOPRAZOLE SODIUM 40 MG PO TBEC
40.0000 mg | DELAYED_RELEASE_TABLET | Freq: Every day | ORAL | Status: DC
  Administered 2021-02-15: 40 mg via ORAL
  Filled 2021-02-14: qty 1

## 2021-02-14 MED ORDER — INSULIN ASPART PROT & ASPART (70-30 MIX) 100 UNIT/ML ~~LOC~~ SUSP
55.0000 [IU] | Freq: Two times a day (BID) | SUBCUTANEOUS | Status: DC
  Administered 2021-02-15: 55 [IU] via SUBCUTANEOUS
  Filled 2021-02-14: qty 10

## 2021-02-14 MED ORDER — POTASSIUM CHLORIDE IN NACL 20-0.9 MEQ/L-% IV SOLN: INTRAVENOUS | Status: AC

## 2021-02-14 MED ORDER — MORPHINE SULFATE (PF) 4 MG/ML IV SOLN
4.0000 mg | Freq: Once | INTRAVENOUS | Status: AC
  Administered 2021-02-14: 4 mg via INTRAVENOUS
  Filled 2021-02-14: qty 1

## 2021-02-14 NOTE — Patient Instructions (Signed)
Go to Ed for chest pain an SOB.   Will follow up as needed for hernia and malrotation.  Continue miralax for constipation.

## 2021-02-14 NOTE — ED Notes (Signed)
ED TO INPATIENT HANDOFF REPORT  ED Nurse Name and Phone #: [phone]  S Name/Age/Gender [name] 70 y.o. female Room/Bed: APA11/APA11  Code Status   Code Status: Prior  Home/SNF/Other Home Patient oriented to: self, place, time and situation Is this baseline? Yes   Triage Complete: Triage complete  Chief Complaint Chest pain [R07.9]  Triage Note Shortness of breath with chest tightness    Allergies No Known Allergies  Level of Care/Admitting Diagnosis ED Disposition    ED Disposition Condition Comment   Admit  Hospital Area: [facility] [[id number]]  Level of Care: Telemetry [5]  Covid Evaluation: Asymptomatic Screening Protocol (No Symptoms)  Diagnosis: Chest pain [629476]  Admitting Physician: Onnie Boer [[id number]]  Attending Physician: Onnie Boer Xenia.Douglas       B Medical/Surgery History Past Medical History:  Diagnosis Date  . Asthma   . Bronchitis   . Diabetes mellitus   . Hyperlipidemia   . Hypertension   . Interatrial cardiac shunt    a. small secundum ASD vs pulmonary shunt.  . Low back pain   . Migraine   . Mild carotid artery disease (HCC)   . Osteoarthritis   . Sciatica   . Varicose veins   . Vertigo    Past Surgical History:  Procedure Laterality Date  . ABDOMINAL HYSTERECTOMY    . BIOPSY  05/12/2019   Procedure: BIOPSY;  Surgeon: West Bali, MD;  [facility];  Service: Endoscopy;;  . CARPAL TUNNEL RELEASE Bilateral   . CESAREAN SECTION    . ESOPHAGOGASTRODUODENOSCOPY (EGD) WITH PROPOFOL N/A 05/12/2019   Procedure: ESOPHAGOGASTRODUODENOSCOPY (EGD) WITH PROPOFOL;  Surgeon: West Bali, MD;  normal-appearing esophagus s/p empiric dilation, moderate gastritis due to ASA s/p biopsy, normal examined duodenum.  Biopsies with gastritis.   . INGUINAL HERNIA REPAIR Left   . LUMBAR FUSION    . POSTERIOR LUMBAR FUSION 4 LEVEL N/A 03/04/2018   Procedure: Right transforaminal lumbar interbody  fusion L1-2, L2-3 Posterior fusion T10, T11, T12, L1, L2 with T 11, T12 pedicle screws, superior sublaminar hooks T10, local bone graft, allograft cancellous chips Vivigen;  Surgeon: Kerrin Champagne, MD;  Location: [facility];  Service: Orthopedics;  Laterality: N/A;  . SAVORY DILATION N/A 05/12/2019   Procedure: SAVORY DILATION;  Surgeon: West Bali, MD;  [facility];  Service: Endoscopy;  Laterality: N/A;     A IV Location/Drains/Wounds Patient Lines/Drains/Airways Status    Active Line/Drains/Airways    Name Placement date Placement time Site Days   Peripheral IV 02/14/21 Left Antecubital 02/14/21  1146  Antecubital  less than 1   Incision (Closed) 03/04/18 Back 03/04/18  1455  -- 1078          Intake/Output Last 24 hours  Intake/Output Summary (Last 24 hours) at 02/14/2021 1601 Last data filed at 02/14/2021 1257 Gross per 24 hour  Intake 500 ml  Output --  Net 500 ml    Labs/Imaging Results for orders placed or performed during the hospital encounter of 02/14/21 (from the past 48 hour(s))  CBC with Differential/Platelet     Status: Abnormal   Collection Time: 02/14/21 11:48 AM  Result Value Ref Range   WBC 12.4 (H) 4.0 - 10.5 K/uL   RBC 5.01 3.87 - 5.11 MIL/uL   Hemoglobin 15.1 (H) 12.0 - 15.0 g/dL   HCT 03.5 (H) 46.5 - 68.1 %   MCV 92.2 80.0 - 100.0 fL   MCH 30.1 26.0 -  34.0 pg   MCHC 32.7 30.0 - 36.0 g/dL   RDW 75.1 70.0 - 17.4 %   Platelets 249 150 - 400 K/uL   nRBC 0.0 0.0 - 0.2 %   Neutrophils Relative % 60 %   Neutro Abs 7.4 1.7 - 7.7 K/uL   Lymphocytes Relative 32 %   Lymphs Abs 4.0 0.7 - 4.0 K/uL   Monocytes Relative 6 %   Monocytes Absolute 0.8 0.1 - 1.0 K/uL   Eosinophils Relative 1 %   Eosinophils Absolute 0.2 0.0 - 0.5 K/uL   Basophils Relative 0 %   Basophils Absolute 0.1 0.0 - 0.1 K/uL   Immature Granulocytes 1 %   Abs Immature Granulocytes 0.07 0.00 - 0.07 K/uL    Comment: Performed at [facility], [address], [place], [place]  [address]  Comprehensive metabolic panel     Status: Abnormal   Collection Time: 02/14/21 11:48 AM  Result Value Ref Range   Sodium 136 135 - 145 mmol/L   Potassium 3.7 3.5 - 5.1 mmol/L   Chloride 97 (L) 98 - 111 mmol/L   CO2 27 22 - 32 mmol/L   Glucose, Bld 234 (H) 70 - 99 mg/dL    Comment: Glucose reference range applies only to samples taken after fasting for at least 8 hours.   BUN 13 8 - 23 mg/dL   Creatinine, Ser 7.59 0.44 - 1.00 mg/dL   Calcium 9.9 8.9 - 16.3 mg/dL   Total Protein 7.9 6.5 - 8.1 g/dL   Albumin 4.5 3.5 - 5.0 g/dL   AST 25 15 - 41 U/L   ALT 21 0 - 44 U/L   Alkaline Phosphatase 71 38 - 126 U/L   Total Bilirubin 0.8 0.3 - 1.2 mg/dL   GFR, Estimated >84 >66 mL/min    Comment: (NOTE) Calculated using the CKD-EPI Creatinine Equation (2021)    Anion gap 12 5 - 15    Comment: Performed at [facility], [address] [place], [place] [address]  Troponin I (High Sensitivity)     Status: None   Collection Time: 02/14/21 11:48 AM  Result Value Ref Range   Troponin I (High Sensitivity) 8 <18 ng/L    Comment: (NOTE) Elevated high sensitivity troponin I (hsTnI) values and significant  changes across serial measurements may suggest ACS but many other  chronic and acute conditions are known to elevate hsTnI results.  Refer to the "Links" section for chest pain algorithms and additional  guidance. Performed at [facility], [address] [place], [place] [address]   D-dimer, quantitative     Status: None   Collection Time: 02/14/21 11:48 AM  Result Value Ref Range   D-Dimer, Quant 0.44 0.00 - 0.50 ug/mL-FEU    Comment: (NOTE) At the manufacturer cut-off value of 0.5 g/mL FEU, this assay has a negative predictive value of 95-100%.This assay is intended for use in conjunction with a clinical pretest probability (PTP) assessment model to exclude pulmonary embolism (PE) and deep venous thrombosis (DVT) in outpatients suspected of PE or DVT. Results should be correlated  with clinical presentation. Performed at [facility], [address] [place], [place] [address]   Brain natriuretic peptide     Status: None   Collection Time: 02/14/21 11:49 AM  Result Value Ref Range   B Natriuretic Peptide 18.0 0.0 - 100.0 pg/mL    Comment: Performed at [facility], [address] [place], [place] [address]  Troponin I (High Sensitivity)  Status: None   Collection Time: 02/14/21  1:53 PM  Result Value Ref Range   Troponin I (High Sensitivity) 10 <18 ng/L    Comment: (NOTE) Elevated high sensitivity troponin I (hsTnI) values and significant  changes across serial measurements may suggest ACS but many other  chronic and acute conditions are known to elevate hsTnI results.  Refer to the "Links" section for chest pain algorithms and additional  guidance. Performed at [facility], [address], [place], [place] [address]    DG Chest Port 1 View  Result Date: 02/14/2021 CLINICAL DATA:  Shortness of breath and chest tightness. EXAM: PORTABLE CHEST 1 VIEW COMPARISON:  02/22/2020 FINDINGS: The heart size and mediastinal contours are within normal limits. Stable elevation of the right hemidiaphragm. There is no evidence of pulmonary edema, consolidation, pneumothorax, nodule or pleural fluid. Stable spinal fusion rods. IMPRESSION: No active disease. Electronically Signed   By: Irish Lack M.D.   On: 02/14/2021 12:17    Pending Labs Unresulted Labs (From admission, onward)         None      Vitals/Pain Today's Vitals   02/14/21 1245 02/14/21 1300 02/14/21 1315 02/14/21 1417  BP:  (!) 149/73  (!) 148/79  Pulse: 98 90 90 90  Resp: (!) 29 20 16 17   Temp:    98.3 F (36.8 C)  TempSrc:    Oral  SpO2: 97% 93% 94% 95%  Height:      PainSc:    0-No pain    Isolation Precautions No active isolations  Medications Medications  morphine 4 MG/ML injection 4 mg (4 mg Intravenous Given 02/14/21 1152)  sodium chloride 0.9 % bolus 500 mL (0 mLs Intravenous  Stopped 02/14/21 1257)  LORazepam (ATIVAN) injection 0.5 mg (0.5 mg Intravenous Given 02/14/21 1256)    Mobility walks Low fall risk   Focused Assessments    R Recommendations: See Admitting Provider Note  Report given to:   Additional Notes:

## 2021-02-14 NOTE — H&P (Signed)
History and Physical    [name] KXF:[id number] DOB: 09/07/1951 DOA: 02/14/2021  PCP: Mirna Mires, MD   Patient coming from: Home  I have personally briefly reviewed patient's old medical records in [facility]  Chief Complaint: Chest pain  HPI: [name] is a 70 y.o. female with medical history significant for diabetes mellitus, asthma, hypertension. Patient presented to ED with complaints of intermittent chest pain of 3 months duration, but reports today she had the most severe episode of chest pain.  Patient was walking into her general surgeon's office Dr. Henreitta Leber when she suddenly had chest pain -central and right sided chest pain.  She describes pain as burning and sharp.  She reports prior episodes of chest pain occurring most days over the past 3 months, lasting about 5 to 10 minutes, unrelated to activity or food, that would self resolved. Chest pain today was associated with difficulty breathing.  She denies palpitation no nausea no vomiting.  Family history of heart disease in patient's mother in her 64s.  But no significant improvement in her chest pain since being in the ED. no personal or family history of blood clots.  ED Course: Stable vitals.  WBC 12.4-chronic leukocytosis.  D-dimer normal at 0.44.  BNP 18.  Troponin 8 > 10. On attempted ambulating patient to the bathroom in the ED, patient suddenly felt dizzy like she was going to fall backwards.  Hospitalization was requested.  Review of Systems: As per HPI all other systems reviewed and negative.  Past Medical History:  Diagnosis Date  . Asthma   . Bronchitis   . Diabetes mellitus   . Hyperlipidemia   . Hypertension   . Interatrial cardiac shunt    a. small secundum ASD vs pulmonary shunt.  . Low back pain   . Migraine   . Mild carotid artery disease (HCC)   . Osteoarthritis   . Sciatica   . Varicose veins   . Vertigo     Past Surgical History:  Procedure Laterality Date  .  ABDOMINAL HYSTERECTOMY    . BIOPSY  05/12/2019   Procedure: BIOPSY;  Surgeon: West Bali, MD;  [facility]: [facility];  Service: Endoscopy;;  . CARPAL TUNNEL RELEASE Bilateral   . CESAREAN SECTION    . ESOPHAGOGASTRODUODENOSCOPY (EGD) WITH PROPOFOL N/A 05/12/2019   Procedure: ESOPHAGOGASTRODUODENOSCOPY (EGD) WITH PROPOFOL;  Surgeon: West Bali, MD;  normal-appearing esophagus s/p empiric dilation, moderate gastritis due to ASA s/p biopsy, normal examined duodenum.  Biopsies with gastritis.   . INGUINAL HERNIA REPAIR Left   . LUMBAR FUSION    . POSTERIOR LUMBAR FUSION 4 LEVEL N/A 03/04/2018   Procedure: Right transforaminal lumbar interbody fusion L1-2, L2-3 Posterior fusion T10, T11, T12, L1, L2 with T 11, T12 pedicle screws, superior sublaminar hooks T10, local bone graft, allograft cancellous chips Vivigen;  Surgeon: Kerrin Champagne, MD;  Location: [facility];  Service: Orthopedics;  Laterality: N/A;  . SAVORY DILATION N/A 05/12/2019   Procedure: SAVORY DILATION;  Surgeon: West Bali, MD;  Location: [facility];  Service: Endoscopy;  Laterality: N/A;     reports that she has never smoked. She has never used smokeless tobacco. She reports that she does not drink alcohol and does not use drugs.  No Known Allergies  Family History  Problem Relation Age of Onset  . Lung cancer Mother   . Breast cancer Mother   . Stroke Father   . Heart failure Sister   .  Diabetes Sister   . Heart failure Sister   . Diabetes Sister   . Colon cancer Neg Hx   . Gastric cancer Neg Hx   . Esophageal cancer Neg Hx     Prior to Admission medications   Medication Sig Start Date End Date Taking? Authorizing Provider  amLODipine (NORVASC) 10 MG tablet Take 1 tablet by mouth daily. 01/02/21  Yes [provider]  aspirin 81 MG chewable tablet Chew 81 mg by mouth daily.   Yes [provider]  gabapentin (NEURONTIN) 100 MG capsule  08/29/20  Yes [provider]  HUMULIN 70/30  (70-30) 100 UNIT/ML injection Inject 66 Units into the skin 2 (two) times daily. 12/07/20  Yes [provider]  hydrochlorothiazide (MICROZIDE) 12.5 MG capsule Take 1 capsule (12.5 mg total) by mouth daily. Patient taking differently: Take 25 mg by mouth daily. 02/24/20  Yes Black, Lesle Chris, NP  losartan (COZAAR) 50 MG tablet Take 50 mg by mouth daily. 04/23/18  Yes [provider]  metFORMIN (GLUCOPHAGE) 1000 MG tablet Take 1,000 mg by mouth at bedtime.   Yes [provider]  nitroGLYCERIN (NITROSTAT) 0.4 MG SL tablet Place 0.4 mg under the tongue every 5 (five) minutes as needed for chest pain. Dissolve one tablet under the tongue every 5 mintues as needed for chest pain.   Yes [provider]  pantoprazole (PROTONIX) 40 MG tablet Take 30- 60 min before your first and last meals of the day 05/11/20  Yes Nyoka Cowden, MD  Semaglutide (RYBELSUS) 3 MG TABS Take 1 tablet by mouth daily.   Yes [provider]  simvastatin (ZOCOR) 20 MG tablet Take 20 mg by mouth at bedtime.   Yes [provider]    Physical Exam: Vitals:   02/14/21 1245 02/14/21 1300 02/14/21 1315 02/14/21 1417  BP:  (!) 149/73  (!) 148/79  Pulse: 98 90 90 90  Resp: (!) 29 20 16 17   Temp:    98.3 F (36.8 C)  TempSrc:    Oral  SpO2: 97% 93% 94% 95%  Height:        Constitutional: NAD, calm, comfortable Vitals:   02/14/21 1245 02/14/21 1300 02/14/21 1315 02/14/21 1417  BP:  (!) 149/73  (!) 148/79  Pulse: 98 90 90 90  Resp: (!) 29 20 16 17   Temp:    98.3 F (36.8 C)  TempSrc:    Oral  SpO2: 97% 93% 94% 95%  Height:       Eyes: PERRL, lids and conjunctivae normal ENMT: Mucous membranes are moist.  Neck: normal, supple, no masses, no thyromegaly Respiratory: clear to auscultation bilaterally, no wheezing, no crackles. Normal respiratory effort. No accessory muscle use.  Cardiovascular: Regular rate and rhythm, no murmurs / rubs / gallops. No extremity edema. 2+  pedal pulses.  Abdomen: no tenderness, no masses palpated. No hepatosplenomegaly. Bowel sounds positive.  Musculoskeletal: no clubbing / cyanosis. No joint deformity upper and lower extremities. Good ROM, no contractures. Normal muscle tone.  Skin: no rashes, lesions, ulcers. No induration Neurologic: No apparent cranial nerve abnormalities, moving extremities spontaneously.  Psychiatric: Normal judgment and insight. Alert and oriented x 3. Normal mood.   Labs on Admission: I have personally reviewed following labs and imaging studies  CBC: Recent Labs  Lab 02/14/21 1148  WBC 12.4*  NEUTROABS 7.4  HGB 15.1*  HCT 46.2*  MCV 92.2  PLT 249   Basic Metabolic Panel: Recent Labs  Lab 02/14/21 1148  NA  136  K 3.7  CL 97*  CO2 27  GLUCOSE 234*  BUN 13  CREATININE 0.92  CALCIUM 9.9   Liver Function Tests: Recent Labs  Lab 02/14/21 1148  AST 25  ALT 21  ALKPHOS 71  BILITOT 0.8  PROT 7.9  ALBUMIN 4.5    Radiological Exams on Admission: DG Chest Port 1 View  Result Date: 02/14/2021 CLINICAL DATA:  Shortness of breath and chest tightness. EXAM: PORTABLE CHEST 1 VIEW COMPARISON:  02/22/2020 FINDINGS: The heart size and mediastinal contours are within normal limits. Stable elevation of the right hemidiaphragm. There is no evidence of pulmonary edema, consolidation, pneumothorax, nodule or pleural fluid. Stable spinal fusion rods. IMPRESSION: No active disease. Electronically Signed   By: Irish Lack M.D.   On: 02/14/2021 12:17    EKG: Independently reviewed.  Sinus tachycardia rate 112, QTc 430.  No significant ST or T wave changes from prior.  Assessment/Plan Principal Problem:   Chest pain Active Problems:   Diabetes (HCC)   Chest pain-atypical, but coronary artery disease risk factors with diabetes and hypertension, age.  Persistent chest pain.  EKG so far unremarkable. D-Dimer normal at 0.44.  Possibly GI related.  Last echo 02/2020 EF 60 to 65%.  Episode of  dizziness on attempted ambulation in the ED. -Trend troponin -EKG in the morning -Resume aspirin, statins -Nitro as needed -N.p.o. midnight -Cardiology consult -Check orthostatic vitals - N/s 100cc/hr x 15hrs  Diabetes mellitus-random glucose 234, - HgbA1c - SSI- S -Hold metformin and semaglutide -resume 70-30 at reduced dose 55 units twice daily ( Home dose 66u)  Hypertension- stable -Resume Norvasc,, losartan -HCTZ held while hydrating  DVT prophylaxis: Lovenox Code Status: DNR, confirmed with patient at bedside with sister at bedside present. Family Communication: Sister at bedside. Disposition Plan: ~ 1 -2 days Consults called: Cardiology Admission status: Obs, tele  Onnie Boer MD [facility]  02/14/2021, 9:56 PM

## 2021-02-14 NOTE — ED Triage Notes (Signed)
Shortness of breath with chest tightness

## 2021-02-14 NOTE — ED Provider Notes (Signed)
[facility] EMERGENCY DEPARTMENT Provider Note   CSN: [id number] Arrival date & time: 02/14/21  1103     History Chief Complaint  Patient presents with  . Shortness of Breath    [name] is a 70 y.o. female.  Patient complains of chest pain and shortness of breath that became severe this morning.  She has had other episodes of this before she has high blood pressure diabetes asthma  The history is provided by the patient and medical records. No language interpreter was used.  Chest Pain Pain location:  Substernal area Pain quality: aching   Pain radiates to:  Does not radiate Pain severity:  Moderate Onset quality:  Sudden Timing:  Constant Progression:  Worsening Chronicity:  Recurrent Context: not breathing   Relieved by:  Nothing Associated symptoms: shortness of breath   Associated symptoms: no abdominal pain, no back pain, no cough, no fatigue and no headache        Past Medical History:  Diagnosis Date  . Asthma   . Bronchitis   . Diabetes mellitus   . Hyperlipidemia   . Hypertension   . Interatrial cardiac shunt    a. small secundum ASD vs pulmonary shunt.  . Low back pain   . Migraine   . Mild carotid artery disease (HCC)   . Osteoarthritis   . Sciatica   . Varicose veins   . Vertigo     Patient Active Problem List   Diagnosis Date Noted  . Ventral hernia without obstruction or gangrene 12/08/2020  . Malrotation of intestine 12/08/2020  . GERD (gastroesophageal reflux disease) 09/22/2020  . Gastritis 09/19/2020  . Other chest pain 09/19/2020  . Reducible bulge of abdominal wall 09/19/2020  . Obesity 02/23/2020  . Chest pain with moderate risk for cardiac etiology 02/22/2020  . Dyspnea on exertion   . Carotid artery stenosis 01/07/2020  . Constipation 09/03/2019  . Dysphagia 08/11/2018  . Leg swelling 08/11/2018  . Bilateral carotid bruits 08/11/2018  . Abnormal EKG 08/06/2018  . Acute blood loss as cause of postoperative  anemia 03/07/2018    Class: Acute  . Spinal stenosis, lumbar region with neurogenic claudication 03/04/2018    Class: Chronic  . Multilevel degenerative disc disease 03/04/2018    Class: Chronic  . Forestier's disease of thoracolumbar region 03/04/2018    Class: Chronic  . Fusion of spine of thoracolumbar region 03/04/2018  . Pain in right hip 03/18/2017  . Stiffness of joint, not elsewhere classified, other specified site 08/04/2013  . Leg weakness, bilateral 08/04/2013  . Chest pain at rest 05/10/2009  . BENIGN POSITIONAL VERTIGO 07/30/2007  . VARICOSE VEINS, LOWER EXTREMITIES 07/30/2007  . SCIATICA, CHRONIC 07/30/2007  . Diabetes (HCC) 11/18/2006  . Hyperlipidemia 11/18/2006  . MIGRAINE HEADACHE 11/18/2006  . LESION, SCIATIC NERVE 11/18/2006  . Essential hypertension 11/18/2006  . HYPOTENSION, ORTHOSTATIC 11/18/2006  . BRONCHITIS NOS 11/18/2006  . Other emphysema (HCC) 11/18/2006  . ASTHMA 11/18/2006  . OSTEOARTHRITIS 11/18/2006  . LOW BACK PAIN 11/18/2006  . VERTIGO 11/18/2006    Past Surgical History:  Procedure Laterality Date  . ABDOMINAL HYSTERECTOMY    . BIOPSY  05/12/2019   Procedure: BIOPSY;  Surgeon: West Bali, MD;  Location: [facility];  Service: Endoscopy;;  . CARPAL TUNNEL RELEASE Bilateral   . CESAREAN SECTION    . ESOPHAGOGASTRODUODENOSCOPY (EGD) WITH PROPOFOL N/A 05/12/2019   Procedure: ESOPHAGOGASTRODUODENOSCOPY (EGD) WITH PROPOFOL;  Surgeon: West Bali, MD;  normal-appearing esophagus s/p empiric dilation,  moderate gastritis due to ASA s/p biopsy, normal examined duodenum.  Biopsies with gastritis.   . INGUINAL HERNIA REPAIR Left   . LUMBAR FUSION    . POSTERIOR LUMBAR FUSION 4 LEVEL N/A 03/04/2018   Procedure: Right transforaminal lumbar interbody fusion L1-2, L2-3 Posterior fusion T10, T11, T12, L1, L2 with T 11, T12 pedicle screws, superior sublaminar hooks T10, local bone graft, allograft cancellous chips Vivigen;  Surgeon: Kerrin Champagne,  MD;  Location: [facility] OR;  Service: Orthopedics;  Laterality: N/A;  . SAVORY DILATION N/A 05/12/2019   Procedure: SAVORY DILATION;  Surgeon: West Bali, MD;  [facility]: [facility];  Service: Endoscopy;  Laterality: N/A;     OB History   No obstetric history on file.     Family History  Problem Relation Age of Onset  . Lung cancer Mother   . Breast cancer Mother   . Stroke Father   . Heart failure Sister   . Diabetes Sister   . Heart failure Sister   . Diabetes Sister   . Colon cancer Neg Hx   . Gastric cancer Neg Hx   . Esophageal cancer Neg Hx     Social History   Tobacco Use  . Smoking status: Never Smoker  . Smokeless tobacco: Never Used  Vaping Use  . Vaping Use: Never used  Substance Use Topics  . Alcohol use: No    Alcohol/week: 0.0 standard drinks  . Drug use: No    Home Medications Prior to Admission medications   Medication Sig Start Date End Date Taking? Authorizing Provider  amLODipine (NORVASC) 10 MG tablet Take 1 tablet by mouth daily. 01/02/21  Yes [provider]  aspirin 81 MG chewable tablet Chew 81 mg by mouth daily.   Yes [provider]  gabapentin (NEURONTIN) 100 MG capsule  08/29/20  Yes [provider]  HUMULIN 70/30 (70-30) 100 UNIT/ML injection Inject 66 Units into the skin 2 (two) times daily. 12/07/20  Yes [provider]  hydrochlorothiazide (MICROZIDE) 12.5 MG capsule Take 1 capsule (12.5 mg total) by mouth daily. Patient taking differently: Take 25 mg by mouth daily. 02/24/20  Yes Black, Lesle Chris, NP  losartan (COZAAR) 50 MG tablet Take 50 mg by mouth daily. 04/23/18  Yes [provider]  metFORMIN (GLUCOPHAGE) 1000 MG tablet Take 1,000 mg by mouth at bedtime.   Yes [provider]  nitroGLYCERIN (NITROSTAT) 0.4 MG SL tablet Place 0.4 mg under the tongue every 5 (five) minutes as needed for chest pain. Dissolve one tablet under the tongue every 5 mintues as needed for chest pain.   Yes  [provider]  pantoprazole (PROTONIX) 40 MG tablet Take 30- 60 min before your first and last meals of the day 05/11/20  Yes Nyoka Cowden, MD  Semaglutide (RYBELSUS) 3 MG TABS Take 1 tablet by mouth daily.   Yes [provider]  simvastatin (ZOCOR) 20 MG tablet Take 20 mg by mouth at bedtime.   Yes [provider]    Allergies    Patient has no known allergies.  Review of Systems   Review of Systems  Constitutional: Negative for appetite change and fatigue.  HENT: Negative for congestion, ear discharge and sinus pressure.   Eyes: Negative for discharge.  Respiratory: Positive for shortness of breath. Negative for cough.   Cardiovascular: Positive for chest pain.  Gastrointestinal: Negative for abdominal pain and diarrhea.  Genitourinary: Negative for frequency and hematuria.  Musculoskeletal: Negative for back  pain.  Skin: Negative for rash.  Neurological: Negative for seizures and headaches.  Psychiatric/Behavioral: Negative for hallucinations.    Physical Exam Updated Vital Signs BP (!) 148/79 (BP Location: Right Arm)   Pulse 90   Temp 98.3 F (36.8 C) (Oral)   Resp 17   Ht 5\' 5"  (1.651 m)   SpO2 95%   BMI 31.45 kg/m   Physical Exam Vitals and nursing note reviewed.  Constitutional:      Appearance: She is well-developed.  HENT:     Head: Normocephalic.     Nose: Nose normal.  Eyes:     General: No scleral icterus.    Conjunctiva/sclera: Conjunctivae normal.  Neck:     Thyroid: No thyromegaly.  Cardiovascular:     Rate and Rhythm: Regular rhythm. Tachycardia present.     Heart sounds: No murmur heard. No friction rub. No gallop.   Pulmonary:     Breath sounds: No stridor. No wheezing or rales.  Chest:     Chest wall: No tenderness.  Abdominal:     General: There is no distension.     Tenderness: There is no abdominal tenderness. There is no rebound.  Musculoskeletal:        General: Normal range of motion.     Cervical  back: Neck supple.  Lymphadenopathy:     Cervical: No cervical adenopathy.  Skin:    Findings: No erythema or rash.  Neurological:     Mental Status: She is alert and oriented to person, place, and time.     Motor: No abnormal muscle tone.     Coordination: Coordination normal.  Psychiatric:        Behavior: Behavior normal.     ED Results / Procedures / Treatments   Labs (all labs ordered are listed, but only abnormal results are displayed) Labs Reviewed  CBC WITH DIFFERENTIAL/PLATELET - Abnormal; Notable for the following components:      Result Value   WBC 12.4 (*)    Hemoglobin 15.1 (*)    HCT 46.2 (*)    All other components within normal limits  COMPREHENSIVE METABOLIC PANEL - Abnormal; Notable for the following components:   Chloride 97 (*)    Glucose, Bld 234 (*)    All other components within normal limits  BRAIN NATRIURETIC PEPTIDE  D-DIMER, QUANTITATIVE  TROPONIN I (HIGH SENSITIVITY)  TROPONIN I (HIGH SENSITIVITY)    EKG None  Radiology DG Chest Port 1 View  Result Date: 02/14/2021 CLINICAL DATA:  Shortness of breath and chest tightness. EXAM: PORTABLE CHEST 1 VIEW COMPARISON:  02/22/2020 FINDINGS: The heart size and mediastinal contours are within normal limits. Stable elevation of the right hemidiaphragm. There is no evidence of pulmonary edema, consolidation, pneumothorax, nodule or pleural fluid. Stable spinal fusion rods. IMPRESSION: No active disease. Electronically Signed   By: Irish LackGlenn  Yamagata M.D.   On: 02/14/2021 12:17    Procedures Procedures   Medications Ordered in ED Medications  morphine 4 MG/ML injection 4 mg (4 mg Intravenous Given 02/14/21 1152)  sodium chloride 0.9 % bolus 500 mL (0 mLs Intravenous Stopped 02/14/21 1257)  LORazepam (ATIVAN) injection 0.5 mg (0.5 mg Intravenous Given 02/14/21 1256)    ED Course  I have reviewed the triage vital signs and the nursing notes.  Pertinent labs & imaging results that were available during my care  of the patient were reviewed by me and considered in my medical decision making (see chart for details).    MDM Rules/Calculators/A&P  Patient with chest pain on exertion.  Labs EKG chest x-ray unremarkable.  She will be admitted to medicine for continued work-up                 Final Clinical Impression(s) / ED Diagnoses Final diagnoses:  Chest pain at rest    Rx / DC Orders ED Discharge Orders    None       Bethann Berkshire, MD 02/14/21 1528

## 2021-02-15 ENCOUNTER — Other Ambulatory Visit (HOSPITAL_COMMUNITY): Payer: Self-pay | Admitting: *Deleted

## 2021-02-15 ENCOUNTER — Observation Stay (HOSPITAL_BASED_OUTPATIENT_CLINIC_OR_DEPARTMENT_OTHER): Payer: Medicare Other

## 2021-02-15 DIAGNOSIS — R079 Chest pain, unspecified: Secondary | ICD-10-CM | POA: Diagnosis not present

## 2021-02-15 DIAGNOSIS — Q211 Atrial septal defect: Secondary | ICD-10-CM

## 2021-02-15 DIAGNOSIS — I10 Essential (primary) hypertension: Secondary | ICD-10-CM

## 2021-02-15 DIAGNOSIS — R0600 Dyspnea, unspecified: Secondary | ICD-10-CM

## 2021-02-15 LAB — ECHOCARDIOGRAM COMPLETE BUBBLE STUDY
AR max vel: 1.59 cm2
AV Area VTI: 1.81 cm2
AV Area mean vel: 1.45 cm2
AV Mean grad: 6.1 mmHg
AV Peak grad: 10.4 mmHg
Ao pk vel: 1.61 m/s
Area-P 1/2: 3.42 cm2
S' Lateral: 2.8 cm

## 2021-02-15 LAB — GLUCOSE, CAPILLARY
Glucose-Capillary: 229 mg/dL — ABNORMAL HIGH (ref 70–99)
Glucose-Capillary: 220 mg/dL — ABNORMAL HIGH (ref 70–99)

## 2021-02-15 MED ORDER — PANTOPRAZOLE SODIUM 40 MG PO TBEC: 40.0000 mg | DELAYED_RELEASE_TABLET | Freq: Every day | ORAL | 5 refills | Status: DC

## 2021-02-15 MED ORDER — PREDNISONE 20 MG PO TABS: 20.0000 mg | ORAL_TABLET | Freq: Every day | ORAL | 0 refills | Status: AC

## 2021-02-15 MED ORDER — PANTOPRAZOLE SODIUM 40 MG PO TBEC: 40.0000 mg | DELAYED_RELEASE_TABLET | Freq: Every day | ORAL | 1 refills | Status: DC

## 2021-02-15 MED ORDER — ASPIRIN EC 81 MG PO TBEC: 81.0000 mg | DELAYED_RELEASE_TABLET | Freq: Every day | ORAL | 5 refills | Status: AC

## 2021-02-15 NOTE — Discharge Summary (Signed)
[name], is a 70 y.o. female  DOB November 25, 1950  MRN [id number].  Admission date:  02/14/2021  Admitting Physician  Onnie Boer, MD  Discharge Date:  02/15/2021   Primary MD  Mirna Mires, MD  Recommendations for primary care physician for things to follow:   1)Avoid ibuprofen/Advil/Aleve/Motrin/Goody Powders/Naproxen/BC powders/Meloxicam/Diclofenac/Indomethacin and other Nonsteroidal anti-inflammatory medications as these will make you more likely to bleed and can cause stomach ulcers, can also cause Kidney problems.   2)follow up with Dr. Mirna Mires, MD within 1 week for recheck   Admission Diagnosis  Chest pain at rest [R07.9] Chest pain [R07.9]   Discharge Diagnosis  Chest pain at rest [R07.9] Chest pain [R07.9]    Principal Problem:   Chest pain Active Problems:   Diabetes [facility])      Past Medical History:  Diagnosis Date  . Asthma   . Bronchitis   . Diabetes mellitus   . Hyperlipidemia   . Hypertension   . Interatrial cardiac shunt    a. small secundum ASD vs pulmonary shunt.  . Low back pain   . Migraine   . Mild carotid artery disease (HCC)   . Osteoarthritis   . Sciatica   . Varicose veins   . Vertigo     Past Surgical History:  Procedure Laterality Date  . ABDOMINAL HYSTERECTOMY    . BIOPSY  05/12/2019   Procedure: BIOPSY;  Surgeon: West Bali, MD;  Location: [facility];  Service: Endoscopy;;  . CARPAL TUNNEL RELEASE Bilateral   . CESAREAN SECTION    . ESOPHAGOGASTRODUODENOSCOPY (EGD) WITH PROPOFOL N/A 05/12/2019   Procedure: ESOPHAGOGASTRODUODENOSCOPY (EGD) WITH PROPOFOL;  Surgeon: West Bali, MD;  normal-appearing esophagus s/p empiric dilation, moderate gastritis due to ASA s/p biopsy, normal examined duodenum.  Biopsies with gastritis.   . INGUINAL HERNIA REPAIR Left   . LUMBAR FUSION    . POSTERIOR LUMBAR FUSION 4 LEVEL N/A 03/04/2018    Procedure: Right transforaminal lumbar interbody fusion L1-2, L2-3 Posterior fusion T10, T11, T12, L1, L2 with T 11, T12 pedicle screws, superior sublaminar hooks T10, local bone graft, allograft cancellous chips Vivigen;  Surgeon: Kerrin Champagne, MD;  Location: [facility] OR;  Service: Orthopedics;  Laterality: N/A;  . SAVORY DILATION N/A 05/12/2019   Procedure: SAVORY DILATION;  Surgeon: West Bali, MD;  Location: [facility];  Service: Endoscopy;  Laterality: N/A;       HPI  from the history and physical done on the day of admission:     Chief Complaint: Chest pain  HPI: [name] is a 70 y.o. female with medical history significant for diabetes mellitus, asthma, hypertension. Patient presented to ED with complaints of intermittent chest pain of 3 months duration, but reports today she had the most severe episode of chest pain.  Patient was walking into her general surgeon's office Dr. Henreitta Leber when she suddenly had chest pain -central and right sided chest pain.  She describes pain as burning and sharp.  She reports  prior episodes of chest pain occurring most days over the past 3 months, lasting about 5 to 10 minutes, unrelated to activity or food, that would self resolved. Chest pain today was associated with difficulty breathing.  She denies palpitation no nausea no vomiting.  Family history of heart disease in patient's mother in her 87s.  But no significant improvement in her chest pain since being in the ED. no personal or family history of blood clots.  ED Course: Stable vitals.  WBC 12.4-chronic leukocytosis.  D-dimer normal at 0.44.  BNP 18.  Troponin 8 > 10. On attempted ambulating patient to the bathroom in the ED, patient suddenly felt dizzy like she was going to fall backwards.  Hospitalization was requested.     Hospital Course:     1)Atypical chest pain--ruled out for ACS by cardiac enzymes and EKG, cardiology consult appreciated, -Patient with prior  history of intra-atrial shunt, however repeat echo reassuring -Patient has a negative coronary CT in August 2020 with a coronary calcium score of 0 -Chest discomfort is reproducible, trial of prednisone for possible costochondritis  2)Epigastric Pain- admits to frequent advil use, and non-compliance with protonix -Avoidance of NSAIDs advised, and compliance with Protonix emphasized  3)PAD/Carotid artery stenosis--continue aspirin and statin, patient follows with vascular surgery  4)HTN--symptomatic orthostasis-resolved after hydration, continue amlodipine, continue losartan, decrease HCTZ to 12.5 mg daily  5)DM2 -A1c is 9.6 reflecting uncontrolled DM with hyperglycemia PTA - Resume insulin -Continue metformin and follow-up with PCP for adjustments of diabetic medication  Discharge Condition: stable  Follow UP-with PCP   Consults obtained -cardiology  Diet and Activity recommendation:  As advised  Discharge Instructions    Discharge Instructions    Call MD for:  difficulty breathing, headache or visual disturbances   Complete by: As directed    Call MD for:  persistant dizziness or light-headedness   Complete by: As directed    Call MD for:  persistant nausea and vomiting   Complete by: As directed    Call MD for:  severe uncontrolled pain   Complete by: As directed    Call MD for:  temperature >100.4   Complete by: As directed    Diet - low sodium heart healthy   Complete by: As directed    Discharge instructions   Complete by: As directed    1)Avoid ibuprofen/Advil/Aleve/Motrin/Goody Powders/Naproxen/BC powders/Meloxicam/Diclofenac/Indomethacin and other Nonsteroidal anti-inflammatory medications as these will make you more likely to bleed and can cause stomach ulcers, can also cause Kidney problems.   2)follow up with Dr. Mirna Mires, MD within 1 week for recheck -   Increase activity slowly   Complete by: As directed         Discharge Medications     Allergies  as of 02/15/2021   No Known Allergies     Medication List    STOP taking these medications   aspirin 81 MG chewable tablet Replaced by: aspirin EC 81 MG tablet     TAKE these medications   amLODipine 10 MG tablet Commonly known as: NORVASC Take 1 tablet by mouth daily.   aspirin EC 81 MG tablet Take 1 tablet (81 mg total) by mouth daily with breakfast. Replaces: aspirin 81 MG chewable tablet   gabapentin 100 MG capsule Commonly known as: NEURONTIN   HumuLIN 70/30 (70-30) 100 UNIT/ML injection Generic drug: insulin NPH-regular Human Inject 66 Units into the skin 2 (two) times daily.   hydrochlorothiazide 12.5 MG capsule Commonly known as: MICROZIDE Take 1 capsule (  12.5 mg total) by mouth daily. What changed: how much to take   losartan 50 MG tablet Commonly known as: COZAAR Take 50 mg by mouth daily.   metFORMIN 1000 MG tablet Commonly known as: GLUCOPHAGE Take 1,000 mg by mouth at bedtime.   nitroGLYCERIN 0.4 MG SL tablet Commonly known as: NITROSTAT Place 0.4 mg under the tongue every 5 (five) minutes as needed for chest pain. Dissolve one tablet under the tongue every 5 mintues as needed for chest pain.   pantoprazole 40 MG tablet Commonly known as: Protonix Take 1 tablet (40 mg total) by mouth daily. What changed:   how much to take  how to take this  when to take this  additional instructions   predniSONE 20 MG tablet Commonly known as: DELTASONE Take 1 tablet (20 mg total) by mouth daily with breakfast for 5 days.   Rybelsus 3 MG Tabs Generic drug: Semaglutide Take 1 tablet by mouth daily.   simvastatin 20 MG tablet Commonly known as: ZOCOR Take 20 mg by mouth at bedtime.       Major procedures and Radiology Reports - PLEASE review detailed and final reports for all details, in brief -   DG Chest Port 1 View  Result Date: 02/14/2021 CLINICAL DATA:  Shortness of breath and chest tightness. EXAM: PORTABLE CHEST 1 VIEW COMPARISON:   02/22/2020 FINDINGS: The heart size and mediastinal contours are within normal limits. Stable elevation of the right hemidiaphragm. There is no evidence of pulmonary edema, consolidation, pneumothorax, nodule or pleural fluid. Stable spinal fusion rods. IMPRESSION: No active disease. Electronically Signed   By: Irish LackGlenn  Yamagata M.D.   On: 02/14/2021 12:17   ECHOCARDIOGRAM COMPLETE BUBBLE STUDY  Result Date: 02/15/2021    ECHOCARDIOGRAM REPORT   Patient Name:   [name] Date of Exam: 02/15/2021 Medical Rec #:  [id number]             Height:       65.0 in Accession #:    [id number]            Weight:       189.0 lb Date of Birth:  01/04/1951             BSA:          1.931 m Patient Age:    70 years              BP:           128/76 mmHg Patient Gender: F                     HR:           79 bpm. [facility] Procedure: 2D Echo, Cardiac Doppler and Color Doppler Indications:    ASD (atrial septal defect) 745.5 / Q21.1  History:        Patient has prior history of Echocardiogram examinations, most                 recent 02/23/2020. Risk Factors:Hypertension, Diabetes and                 Dyslipidemia.  Sonographer:    Celesta GentileBernard White RCS Referring Phys: [id number] Ellsworth LennoxBRITTANY M STRADER IMPRESSIONS  1. Left ventricular ejection fraction, by estimation, is 60 to 65%. The left ventricle has normal function. The left ventricle has no regional wall motion abnormalities. There is mild left ventricular hypertrophy. Left ventricular diastolic parameters are consistent with Grade  I diastolic dysfunction (impaired relaxation).  2. Right ventricular systolic function is normal. The right ventricular size is normal.  3. Left atrial size was mildly dilated.  4. The mitral valve is normal in structure. Trivial mitral valve regurgitation. No evidence of mitral stenosis.  5. The aortic valve is tricuspid. Aortic valve regurgitation is not visualized. No aortic stenosis is present.  6. The inferior vena cava is normal  in size with greater than 50% respiratory variability, suggesting right atrial pressure of 3 mmHg. FINDINGS  Left Ventricle: Left ventricular ejection fraction, by estimation, is 60 to 65%. The left ventricle has normal function. The left ventricle has no regional wall motion abnormalities. The left ventricular internal cavity size was normal in size. There is  mild left ventricular hypertrophy. Left ventricular diastolic parameters are consistent with Grade I diastolic dysfunction (impaired relaxation). Normal left ventricular filling pressure. Right Ventricle: The right ventricular size is normal. No increase in right ventricular wall thickness. Right ventricular systolic function is normal. Left Atrium: Left atrial size was mildly dilated. Right Atrium: Right atrial size was normal in size. Pericardium: There is no evidence of pericardial effusion. Mitral Valve: The mitral valve is normal in structure. Trivial mitral valve regurgitation. No evidence of mitral valve stenosis. Tricuspid Valve: The tricuspid valve is normal in structure. Tricuspid valve regurgitation is trivial. No evidence of tricuspid stenosis. Aortic Valve: The aortic valve is tricuspid. Aortic valve regurgitation is not visualized. No aortic stenosis is present. Aortic valve mean gradient measures 6.1 mmHg. Aortic valve peak gradient measures 10.4 mmHg. Aortic valve area, by VTI measures 1.81  cm. Pulmonic Valve: The pulmonic valve was not well visualized. Pulmonic valve regurgitation is not visualized. No evidence of pulmonic stenosis. Aorta: The aortic root is normal in size and structure. Pulmonary Artery: Indeterminant PASP, inadequate TR jet. Venous: The inferior vena cava is normal in size with greater than 50% respiratory variability, suggesting right atrial pressure of 3 mmHg. IAS/Shunts: No atrial level shunt detected by color flow Doppler. Agitated saline contrast was given intravenously to evaluate for intracardiac shunting.  LEFT  VENTRICLE PLAX 2D LVIDd:         4.30 cm  Diastology LVIDs:         2.80 cm  LV e' medial:    7.40 cm/s LV PW:         1.10 cm  LV E/e' medial:  9.9 LV IVS:        1.10 cm  LV e' lateral:   10.30 cm/s LVOT diam:     1.65 cm  LV E/e' lateral: 7.1 LV SV:         56 LV SV Index:   29 LVOT Area:     2.14 cm  RIGHT VENTRICLE RV S prime:     12.00 cm/s TAPSE (M-mode): 1.8 cm LEFT ATRIUM             Index       RIGHT ATRIUM           Index LA diam:        3.70 cm 1.92 cm/m  RA Area:     12.70 cm LA Vol (A2C):   70.4 ml 36.46 ml/m RA Volume:   30.30 ml  15.69 ml/m LA Vol (A4C):   51.9 ml 26.88 ml/m LA Biplane Vol: 60.5 ml 31.33 ml/m  AORTIC VALVE AV Area (Vmax):    1.59 cm AV Area (Vmean):   1.45 cm AV Area (VTI):  1.81 cm AV Vmax:           161.09 cm/s AV Vmean:          118.067 cm/s AV VTI:            0.312 m AV Peak Grad:      10.4 mmHg AV Mean Grad:      6.1 mmHg LVOT Vmax:         120.00 cm/s LVOT Vmean:        80.000 cm/s LVOT VTI:          0.264 m LVOT/AV VTI ratio: 0.85  AORTA Ao Root diam: 3.10 cm MITRAL VALVE MV Area (PHT): 3.42 cm    SHUNTS MV Decel Time: 222 msec    Systemic VTI:  0.26 m MV E velocity: 73.30 cm/s  Systemic Diam: 1.65 cm MV A velocity: 97.50 cm/s MV E/A ratio:  0.75 Dina Rich MD Electronically signed by Dina Rich MD Signature Date/Time: 02/15/2021/3:09:53 PM    Final     Micro Results   Recent Results (from the past 240 hour(s))  Resp Panel by RT-PCR (Flu A&B, Covid) Nasopharyngeal Swab     Status: None   Collection Time: 02/14/21  4:49 PM   Specimen: Nasopharyngeal Swab; Nasopharyngeal(NP) swabs in vial transport medium  Result Value Ref Range Status   SARS Coronavirus 2 by RT PCR NEGATIVE NEGATIVE Final    Comment: (NOTE) SARS-CoV-2 target nucleic acids are NOT DETECTED.  The SARS-CoV-2 RNA is generally detectable in upper respiratory specimens during the acute phase of infection. The lowest concentration of SARS-CoV-2 viral copies this assay can detect  is 138 copies/mL. A negative result does not preclude SARS-Cov-2 infection and should not be used as the sole basis for treatment or other patient management decisions. A negative result may occur with  improper specimen collection/handling, submission of specimen other than nasopharyngeal swab, presence of viral mutation(s) within the areas targeted by this assay, and inadequate number of viral copies(<138 copies/mL). A negative result must be combined with clinical observations, patient history, and epidemiological information. The expected result is Negative.  Fact Sheet for Patients:  BloggerCourse.com  Fact Sheet for Healthcare Providers:  SeriousBroker.it  This test is no t yet approved or cleared by the [place] FDA and  has been authorized for detection and/or diagnosis of SARS-CoV-2 by FDA under an Emergency Use Authorization (EUA). This EUA will remain  in effect (meaning this test can be used) for the duration of the COVID-19 declaration under Section 564(b)(1) of the Act, 21 U.S.C.section 360bbb-3(b)(1), unless the authorization is terminated  or revoked sooner.       Influenza A by PCR NEGATIVE NEGATIVE Final   Influenza B by PCR NEGATIVE NEGATIVE Final    Comment: (NOTE) The Xpert Xpress SARS-CoV-2/FLU/RSV plus assay is intended as an aid in the diagnosis of influenza from Nasopharyngeal swab specimens and should not be used as a sole basis for treatment. Nasal washings and aspirates are unacceptable for Xpert Xpress SARS-CoV-2/FLU/RSV testing.  Fact Sheet for Patients: BloggerCourse.com  Fact Sheet for Healthcare Providers: SeriousBroker.it  This test is not yet approved or cleared by the [place] FDA and has been authorized for detection and/or diagnosis of SARS-CoV-2 by FDA under an Emergency Use Authorization (EUA). This EUA will remain in effect  (meaning this test can be used) for the duration of the COVID-19 declaration under Section 564(b)(1) of the Act, 21 U.S.C. section 360bbb-3(b)(1), unless the authorization is terminated or revoked.  Performed at [facility], [address], [place], [place] [address]        Today   Subjective    [name] today has no new complaints -Ambulating around without chest pains or dyspnea on exertion          Patient has been seen and examined prior to discharge   Objective   Blood pressure (!) 170/51, pulse 81, temperature 99 F (37.2 C), temperature source Oral, resp. rate 17, height  (1.651 m), SpO2 94 %.   Intake/Output Summary (Last 24 hours) at 02/15/2021 1622 Last data filed at 02/15/2021 1300 Gross per 24 hour  Intake 1089.91 ml  Output --  Net 1089.91 ml    Exam Gen:- Awake Alert, no acute distress  HEENT:- [place].AT, No sclera icterus Neck-Supple Neck,No JVD,.  Lungs-  CTAB , good air movement bilaterally  CV- S1, S2 normal, regular, anterior wall chest tenderness on palpation Abd-  +ve B.Sounds, Abd Soft, No tenderness,    Extremity/Skin:- No  edema,   good pulses Psych-affect is appropriate, oriented x3 Neuro-no new focal deficits, no tremors    Data Review   CBC w Diff:  Lab Results  Component Value Date   WBC 12.4 (H) 02/14/2021   HGB 15.1 (H) 02/14/2021   HCT 46.2 (H) 02/14/2021   PLT 249 02/14/2021   LYMPHOPCT 32 02/14/2021   MONOPCT 6 02/14/2021   EOSPCT 1 02/14/2021   BASOPCT 0 02/14/2021    CMP:  Lab Results  Component Value Date   NA 136 02/14/2021   K 3.7 02/14/2021   CL 97 (L) 02/14/2021   CO2 27 02/14/2021   BUN 13 02/14/2021   CREATININE 0.92 02/14/2021   PROT 7.9 02/14/2021   ALBUMIN 4.5 02/14/2021   BILITOT 0.8 02/14/2021   ALKPHOS 71 02/14/2021   AST 25 02/14/2021   ALT 21 02/14/2021  .   Total Discharge time is about 33 minutes  Shon Hale M.D on 02/15/2021 at 4:22 PM  Go to [email or web address] -  for contact  info  [facility] - Office  [phone]

## 2021-02-15 NOTE — Progress Notes (Signed)
*  PRELIMINARY RESULTS* Echocardiogram 2D Echocardiogram has been performed with a saline bubble study.  Stacey Drain 02/15/2021, 1:47 PM

## 2021-02-15 NOTE — Consult Note (Addendum)
Cardiology Consultation:   Patient ID: [name] MRN: [id number]; DOB: 1951-10-13  Admit date: 02/14/2021 Date of Consult: 02/15/2021  PCP:  Angela Mires, MD   [facility] Providers Cardiologist:  Angela Docker, MD (Inactive) --> Needs to switch to new MD  Patient Profile:   [name] is a 70 y.o. female with past medical history of carotid artery stenosis, HTN, HLD, IDDM, multiple ventral hernias, asthma, history of atypical chest pain (Coronary Calcium Score of 0 by Coronary CT in 05/2019) and interatrial shunt (noted on prior echo with right to left shunting) who is being seen today for the evaluation of chest pain at the request of Dr. Mariea Reid.  History of Present Illness:   [name] presented to [name] ED on 02/14/2021 for evaluation of chest pain and dyspnea  In talking with the patient today, she reports having episodes of chest pain and dyspnea on exertion over the past year. Says she gets short of breath with minimal activity such as walking to her mailbox or up a flight of stairs.  Denies any specific orthopnea or PND. Does experience intermittent lower extremity edema. She reports her chest pain can occur sporadically at rest or with activity and she describes this as a sharp pain. This is sometimes triggered by food consumption and she feels like food becomes stuck in her throat. Reports she required dilation of her esophagus in the past. Does note that her recent chest pain is worse if she presses in on her sternal region. Her pain can last for 10 to 15 minutes and spontaneously resolves.  Labs this admission show WBC 12.4 (chronically elevated), Hgb 15.1, platelets 249, Na+ 136, K+ 3.7 and creatinine 0.92. D-dimer negative at 0.44. BNP 18. Initial and repeat Hs Troponin values have been negative at 8, 10 and 15. COVID negative. CXR with no active disease. EKG shows sinus tachycardia, HR 112 with PAC's but no acute ST changes when compared to prior  imaging. Telemetry overnight has shown NSR with HR in 70's to 80's and occasional PVC's.    Past Medical History:  Diagnosis Date  . Asthma   . Bronchitis   . Diabetes mellitus   . Hyperlipidemia   . Hypertension   . Interatrial cardiac shunt    a. small secundum ASD vs pulmonary shunt.  . Low back pain   . Migraine   . Mild carotid artery disease (HCC)   . Osteoarthritis   . Sciatica   . Varicose veins   . Vertigo     Past Surgical History:  Procedure Laterality Date  . ABDOMINAL HYSTERECTOMY    . BIOPSY  05/12/2019   Procedure: BIOPSY;  Surgeon: West Bali, MD;  Location: [facility];  Service: Endoscopy;;  . CARPAL TUNNEL RELEASE Bilateral   . CESAREAN SECTION    . ESOPHAGOGASTRODUODENOSCOPY (EGD) WITH PROPOFOL N/A 05/12/2019   Procedure: ESOPHAGOGASTRODUODENOSCOPY (EGD) WITH PROPOFOL;  Surgeon: West Bali, MD;  normal-appearing esophagus s/p empiric dilation, moderate gastritis due to ASA s/p biopsy, normal examined duodenum.  Biopsies with gastritis.   . INGUINAL HERNIA REPAIR Left   . LUMBAR FUSION    . POSTERIOR LUMBAR FUSION 4 LEVEL N/A 03/04/2018   Procedure: Right transforaminal lumbar interbody fusion L1-2, L2-3 Posterior fusion T10, T11, T12, L1, L2 with T 11, T12 pedicle screws, superior sublaminar hooks T10, local bone graft, allograft cancellous chips Vivigen;  Surgeon: Kerrin Champagne, MD;  Location: [facility] OR;  Service: Orthopedics;  Laterality: N/A;  .  SAVORY DILATION N/A 05/12/2019   Procedure: SAVORY DILATION;  Surgeon: West Bali, MD;  Location: [facility];  Service: Endoscopy;  Laterality: N/A;     Home Medications:  Prior to Admission medications   Medication Sig Start Date End Date Taking? Authorizing Provider  amLODipine (NORVASC) 10 MG tablet Take 1 tablet by mouth daily. 01/02/21  Yes [provider]  aspirin 81 MG chewable tablet Chew 81 mg by mouth daily.   Yes [provider]  gabapentin (NEURONTIN) 100 MG capsule   08/29/20  Yes [provider]  HUMULIN 70/30 (70-30) 100 UNIT/ML injection Inject 66 Units into the skin 2 (two) times daily. 12/07/20  Yes [provider]  hydrochlorothiazide (MICROZIDE) 12.5 MG capsule Take 1 capsule (12.5 mg total) by mouth daily. Patient taking differently: Take 25 mg by mouth daily. 02/24/20  Yes Black, Lesle Chris, NP  losartan (COZAAR) 50 MG tablet Take 50 mg by mouth daily. 04/23/18  Yes [provider]  metFORMIN (GLUCOPHAGE) 1000 MG tablet Take 1,000 mg by mouth at bedtime.   Yes [provider]  nitroGLYCERIN (NITROSTAT) 0.4 MG SL tablet Place 0.4 mg under the tongue every 5 (five) minutes as needed for chest pain. Dissolve one tablet under the tongue every 5 mintues as needed for chest pain.   Yes [provider]  pantoprazole (PROTONIX) 40 MG tablet Take 30- 60 min before your first and last meals of the day 05/11/20  Yes Nyoka Cowden, MD  Semaglutide (RYBELSUS) 3 MG TABS Take 1 tablet by mouth daily.   Yes [provider]  simvastatin (ZOCOR) 20 MG tablet Take 20 mg by mouth at bedtime.   Yes [provider]    Inpatient Medications: Scheduled Meds: . amLODipine  10 mg Oral Daily  . aspirin  81 mg Oral Daily  . enoxaparin (LOVENOX) injection  40 mg Subcutaneous Q24H  . gabapentin  100 mg Oral Daily  . insulin aspart  0-5 Units Subcutaneous QHS  . insulin aspart  0-9 Units Subcutaneous TID WC  . insulin aspart protamine- aspart  55 Units Subcutaneous BID WC  . losartan  50 mg Oral Daily  . pantoprazole  40 mg Oral Daily  . pneumococcal 23 valent vaccine  0.5 mL Intramuscular Once  . simvastatin  20 mg Oral QHS   Continuous Infusions: . 0.9 % NaCl with KCl 20 mEq / L 100 mL/hr at 02/14/21 2241   PRN Meds: acetaminophen **OR** acetaminophen, alum & mag hydroxide-simeth, morphine injection, nitroGLYCERIN, ondansetron **OR** ondansetron (ZOFRAN) IV, polyethylene glycol  Allergies:   No Known  Allergies  Social History:   Social History   Socioeconomic History  . Marital status: Single    Spouse name: Not on file  . Number of children: Not on file  . Years of education: 12th   . Highest education level: Not on file  Occupational History  . Occupation: Disabled    Employer: UNEMPLOYED  Tobacco Use  . Smoking status: Never Smoker  . Smokeless tobacco: Never Used  Vaping Use  . Vaping Use: Never used  Substance and Sexual Activity  . Alcohol use: No    Alcohol/week: 0.0 standard drinks  . Drug use: No  . Sexual activity: Yes    Birth control/protection: Surgical  Other Topics Concern  . Not on file  Social History Narrative   No caffeine use. 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 2 CHILDREN: 1 IN [place], 1 IN [employer] CURRENTLY STATIONED  IN [place].   Social Determinants of Health   Financial Resource Strain: Not on file  Food Insecurity: Not on file  Transportation Needs: Not on file  Physical Activity: Not on file  Stress: Not on file  Social Connections: Not on file  Intimate Partner Violence: Not on file    Family History:    Family History  Problem Relation Age of Onset  . Lung cancer Mother   . Breast cancer Mother   . Stroke Father   . Heart failure Sister   . Diabetes Sister   . Heart failure Sister   . Diabetes Sister   . Colon cancer Neg Hx   . Gastric cancer Neg Hx   . Esophageal cancer Neg Hx      ROS:  Please see the history of present illness.   All other ROS reviewed and negative.     Physical Exam/Data:   Vitals:   02/14/21 1709 02/14/21 1954 02/14/21 2037 02/15/21 0350  BP: 136/76 128/68  121/67  Pulse: 93 88  79  Resp: (!) 22 20  20   Temp: 98.8 F (37.1 C) 98.2 F (36.8 C)  97.7 F (36.5 C)  TempSrc: Oral     SpO2: 98% 93% 96% 92%  Height:        Intake/Output Summary (Last 24 hours) at 02/15/2021 0843 Last data filed at 02/15/2021 0600 Gross per 24 hour  Intake 1229.91 ml   Output --  Net 1229.91 ml   Last 3 Weights 02/14/2021 12/08/2020 10/27/2020  Weight (lbs) 189 lb 190 lb 188 lb  Weight (kg) 85.73 kg 86.183 kg 85.276 kg     Body mass index is 31.45 kg/m.  General:  Well nourished, well developed, in no acute distress. HEENT: normal Lymph: no adenopathy Neck: no JVD Endocrine:  No thryomegaly Vascular: No carotid bruits; FA pulses 2+ bilaterally without bruits  Cardiac:  normal S1, S2; RRR; no murmur.  Lungs:  clear to auscultation bilaterally, no wheezing, rhonchi or rales  Abd: soft, nontender, no hepatomegaly  Ext: no pitting edema.  Musculoskeletal:  No deformities, BUE and BLE strength normal and equal Skin: warm and dry  Neuro:  CNs 2-12 intact, no focal abnormalities noted Psych:  Normal affect   EKG:  The EKG was personally reviewed and demonstrates: Sinus tachycardia, HR 112 with PAC's but no acute ST changes when compared to prior imaging.   Telemetry:  Telemetry was personally reviewed and demonstrates: NSR with HR in 70's to 80's and occasional PVC's  Relevant CV Studies:  Coronary CT: 05/2019 Coronary Arteries:  Normal coronary origin.  Right dominance.  RCA is a dominant artery that gives rise to PDA and PLVB. There is minimal soft attenuation plaque distally.  Left main is a large artery that gives rise to LAD, LCX, and RI arteries.  LAD is a large vessel that has no plaque. There is a small D1 without plaque.  LCX is a small, non-dominant artery.   There is no plaque.  Other findings:  Normal pulmonary vein drainage into the left atrium.  Normal let atrial appendage without a thrombus.  Small secundum ASD noted.  7.11mm x 3.2 mm.  Normal size of the pulmonary artery.  IMPRESSION: 1. Coronary calcium score of 0. This was 0 percentile for age and sex matched control.  2. Normal coronary origin with right dominance.  3. No evidence of CAD.  4.  Small secundum ASD noted.   Echocardiogram:  02/2020 IMPRESSIONS    1.  Left ventricular ejection fraction, by estimation, is 60 to 65%. The  left ventricle has normal function. The left ventricle has no regional  wall motion abnormalities. There is mild concentric left ventricular  hypertrophy. Left ventricular diastolic  parameters were normal.  2. Right ventricular systolic function is normal. The right ventricular  size is normal. Tricuspid regurgitation signal is inadequate for assessing  PA pressure.  3. The mitral valve is normal in structure. No evidence of mitral valve  regurgitation. No evidence of mitral stenosis.  4. The aortic valve is normal in structure. Aortic valve regurgitation is  not visualized. No aortic stenosis is present.  5. The inferior vena cava is normal in size with greater than 50%  respiratory variability, suggesting right atrial pressure of 3 mmHg.  6. Agitated saline contrast bubble study was positive with shunting  observed within 3-6 cardiac cycles suggestive of interatrial shunt. The  right to left shunt is small at rest, moderate following the Valsalva  maneuver.   Comparison(s): No significant change from prior study.    Laboratory Data:  High Sensitivity Troponin:   Recent Labs  Lab 02/14/21 1148 02/14/21 1353 02/14/21 2022  TROPONINIHS 8 10 15      Chemistry Recent Labs  Lab 02/14/21 1148  NA 136  K 3.7  CL 97*  CO2 27  GLUCOSE 234*  BUN 13  CREATININE 0.92  CALCIUM 9.9  GFRNONAA >60  ANIONGAP 12    Recent Labs  Lab 02/14/21 1148  PROT 7.9  ALBUMIN 4.5  AST 25  ALT 21  ALKPHOS 71  BILITOT 0.8   Hematology Recent Labs  Lab 02/14/21 1148  WBC 12.4*  RBC 5.01  HGB 15.1*  HCT 46.2*  MCV 92.2  MCH 30.1  MCHC 32.7  RDW 13.2  PLT 249   BNP Recent Labs  Lab 02/14/21 1149  BNP 18.0    DDimer  Recent Labs  Lab 02/14/21 1148  DDIMER 0.44     Radiology/Studies:  DG Chest Port 1 View  Result Date: 02/14/2021 CLINICAL DATA:  Shortness of  breath and chest tightness. EXAM: PORTABLE CHEST 1 VIEW COMPARISON:  02/22/2020 FINDINGS: The heart size and mediastinal contours are within normal limits. Stable elevation of the right hemidiaphragm. There is no evidence of pulmonary edema, consolidation, pneumothorax, nodule or pleural fluid. Stable spinal fusion rods. IMPRESSION: No active disease. Electronically Signed   By: Irish LackGlenn  Yamagata M.D.   On: 02/14/2021 12:17     Assessment and Plan:   1. Chest Pain with Atypical Features - Her pain occurs sporadically at rest or with activity and spontaneously resolves within 10 minutes.  It appears that some episodes are triggered by food consumption and she required esophageal dilation in the past. At this time, her pain is also reproducible along her sternum on examination. - Her troponin values have been negative and EKG is without acute ST changes. Given her coronary calcium score of 0 by Coronary CT in 05/2019, would not anticipate further ischemic testing at this time. - Given her worsening dyspnea, will update an echocardiogram to reassess her interatrial shunt but by review of prior notes this was not felt to have contributed to her symptoms in the past and did not require further work-up at that time.  2.  HTN - BP has been variable at 113/73 - 175/84 since admission, and 121/67 on most recent check. She has been continued on Amlodipine 10 mg daily and Losartan 50 mg daily. She did report dizziness with  ambulation while in the ED and orthostatic vitals are pending. Currently receiving IVF and HCTZ  daily held.   3. HLD - Followed by her PCP as an outpatient. She remains on Simvastatin 20 mg daily.  4. Carotid Artery Stenosis - Followed by Vascular Surgery and most recent dopplers in 12/2019 showed 1 to 39% stenosis bilaterally which was overall improved as compared to prior imaging and was recommended to recheck in 18 months.  Remains on ASA and statin therapy.   Risk Assessment/Risk  Scores:     HEAR Score (for undifferentiated chest pain):  HEAR Score: 4    For questions or updates, please contact [facility] Please consult [email or web address] for contact info under    Signed, Ellsworth Lennox, PA-C  02/15/2021 8:43 AM   Attendning note  Patient seen and discussed with PA Iran Ouch, I agree with her documentation. 70 yo female history of HTN, HL, asthma, carotid stenosis, and atypical chest pain presents with chest pain. Pain midchest lasting 10-15 minutes, can be worst with palpation, can at times be associated with meals.     WBC 12.4 Hgb 15.1 Plt 249 K 3.7 Cr 0.92 Ddimer 0.44BNP 18  hstrop 8-->10-->15 COVID neg CXR no acute process EKG sinus tach, no acute ischemic changes Echo pending  08/2017 nuclear stress: low risk, apical artifact vs very mild ischemia 05/2019 coronary CTA: no CAD, Ca score of 0. Small secundum ASD  Chest pain not consistent with cardiac etiology. No objective evidence of ischemia by EKG or enzymes. Prior nuclear stress 2018 without significant findings, more recently. 05/2019 coronary CTA without disease and coronary Ca score of 0. F/u echo, if normal no additional cardiac testing planned and would consider GI evaluation  Dina Rich MD

## 2021-02-15 NOTE — Care Management Obs Status (Signed)
MEDICARE OBSERVATION STATUS NOTIFICATION   Patient Details  Name: [name] MRN: [id number] Date of Birth: [birth year]   Medicare Observation Status Notification Given:       Leitha Bleak, RN 02/15/2021, 3:17 PM

## 2021-02-15 NOTE — Care Management Obs Status (Signed)
MEDICARE OBSERVATION STATUS NOTIFICATION   Patient Details  Name: [name] MRN: [id number] Date of Birth: 22-Sep-1951   Medicare Observation Status Notification Given:  Yes    Corey Harold 02/15/2021, 3:34 PM

## 2021-02-15 NOTE — Discharge Instructions (Signed)
1)Avoid ibuprofen/Advil/Aleve/Motrin/Goody Powders/Naproxen/BC powders/Meloxicam/Diclofenac/Indomethacin and other Nonsteroidal anti-inflammatory medications as these will make you more likely to bleed and can cause stomach ulcers, can also cause Kidney problems.   2)follow up with Dr. Mirna Mires, MD within 1 week for recheck

## 2021-02-16 LAB — HEMOGLOBIN A1C
Hgb A1c MFr Bld: 9.6 % — ABNORMAL HIGH (ref 4.8–5.6)
Mean Plasma Glucose: 228.82 mg/dL

## 2021-02-16 NOTE — Progress Notes (Signed)
[facility] Note   HPI:  70 y.o. Female presents to clinic for follow-up discussion of her hernia and incidentally found malrotation. Last time we talked her main issues were some gurgling of her stomach that made her self conscious and constipation. Discussed to try miralax for constipation and she says she is having Bms every 2 days now and something multiple a day. She also is reporting her biggest complaint right now being SOB and that she gets some chest pressure. She says she cannot even walk up her basement stairs without resting.   Review of Systems:  Having Bms SOB Chest pain All other review of systems: otherwise negative   Vital Signs:  BP 113/73   Pulse 93   Temp 98.1 F (36.7 C) (Other (Comment))   Resp 16   Ht 5\' 5"  (1.651 m)   Wt 189 lb (85.7 kg)   SpO2 91%   BMI 31.45 kg/m    Physical Exam:  Physical Exam Vitals reviewed.  HENT:     Head: Normocephalic.  Cardiovascular:     Rate and Rhythm: Normal rate.  Pulmonary:     Effort: Pulmonary effort is normal.  Abdominal:     General: There is no distension.     Palpations: Abdomen is soft.     Tenderness: There is no abdominal tenderness.  Musculoskeletal:        General: Normal range of motion.  Neurological:     General: No focal deficit present.     Mental Status: She is alert.      Assessment:  70 y.o. yo Female with incidentally found malrotation and a ventral hernia. We have discussed that repair of the malrotation would be a large surgery and that we could repair a hernia to at the same time. Discussed that if she is having SOB and CP that this needs to take precedence right now. 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  Plan:  Go to Ed for chest pain an SOB.   Will follow up as needed for hernia and malrotation.  Continue miralax for constipation.   Will have office check on patient in  the upcoming week to see if she wants to see me back after her CP work up.    66, MD [facility] [address] [place], [place] [phone] (office)

## 2021-02-22 ENCOUNTER — Telehealth: Payer: Self-pay | Admitting: Family Medicine

## 2021-02-22 NOTE — Telephone Encounter (Signed)
Called pt to f/u on scheduling surgery and if she still wanted to proceed with her hernia repair. She states that as of right now she would like to wait and will call when she is ready for surgery. I did inform pt that she will need an ov within 30 days of scheduling surgery. Pt verbalized understanding.

## 2021-02-24 DIAGNOSIS — H25813 Combined forms of age-related cataract, bilateral: Secondary | ICD-10-CM | POA: Diagnosis not present

## 2021-02-27 DIAGNOSIS — R0602 Shortness of breath: Secondary | ICD-10-CM | POA: Diagnosis not present

## 2021-02-27 DIAGNOSIS — R946 Abnormal results of thyroid function studies: Secondary | ICD-10-CM | POA: Diagnosis not present

## 2021-02-27 DIAGNOSIS — Z7189 Other specified counseling: Secondary | ICD-10-CM | POA: Diagnosis not present

## 2021-02-27 DIAGNOSIS — I10 Essential (primary) hypertension: Secondary | ICD-10-CM | POA: Diagnosis not present

## 2021-02-27 DIAGNOSIS — E1169 Type 2 diabetes mellitus with other specified complication: Secondary | ICD-10-CM | POA: Diagnosis not present

## 2021-02-27 DIAGNOSIS — R10816 Epigastric abdominal tenderness: Secondary | ICD-10-CM | POA: Diagnosis not present

## 2021-02-27 DIAGNOSIS — Z0001 Encounter for general adult medical examination with abnormal findings: Secondary | ICD-10-CM | POA: Diagnosis not present

## 2021-02-27 DIAGNOSIS — R079 Chest pain, unspecified: Secondary | ICD-10-CM | POA: Diagnosis not present

## 2021-03-12 ENCOUNTER — Observation Stay (HOSPITAL_COMMUNITY): Payer: Medicare Other

## 2021-03-12 ENCOUNTER — Emergency Department (HOSPITAL_COMMUNITY): Payer: Medicare Other

## 2021-03-12 ENCOUNTER — Encounter (HOSPITAL_COMMUNITY): Payer: Self-pay

## 2021-03-12 ENCOUNTER — Inpatient Hospital Stay (HOSPITAL_COMMUNITY)
Admission: EM | Admit: 2021-03-12 | Discharge: 2021-03-21 | DRG: 336 | Disposition: A | Discharge status: Skilled Nursing Facility | Payer: Medicare Other | Attending: Family Medicine | Admitting: Family Medicine

## 2021-03-12 ENCOUNTER — Other Ambulatory Visit: Payer: Self-pay

## 2021-03-12 DIAGNOSIS — E785 Hyperlipidemia, unspecified: Secondary | ICD-10-CM | POA: Diagnosis not present

## 2021-03-12 DIAGNOSIS — K439 Ventral hernia without obstruction or gangrene: Secondary | ICD-10-CM | POA: Diagnosis not present

## 2021-03-12 DIAGNOSIS — Z7982 Long term (current) use of aspirin: Secondary | ICD-10-CM

## 2021-03-12 DIAGNOSIS — R2689 Other abnormalities of gait and mobility: Secondary | ICD-10-CM | POA: Diagnosis not present

## 2021-03-12 DIAGNOSIS — K432 Incisional hernia without obstruction or gangrene: Secondary | ICD-10-CM | POA: Diagnosis not present

## 2021-03-12 DIAGNOSIS — R0902 Hypoxemia: Secondary | ICD-10-CM | POA: Diagnosis not present

## 2021-03-12 DIAGNOSIS — K219 Gastro-esophageal reflux disease without esophagitis: Secondary | ICD-10-CM | POA: Diagnosis present

## 2021-03-12 DIAGNOSIS — R0789 Other chest pain: Secondary | ICD-10-CM | POA: Diagnosis not present

## 2021-03-12 DIAGNOSIS — G43909 Migraine, unspecified, not intractable, without status migrainosus: Secondary | ICD-10-CM | POA: Diagnosis present

## 2021-03-12 DIAGNOSIS — Z48815 Encounter for surgical aftercare following surgery on the digestive system: Secondary | ICD-10-CM | POA: Diagnosis not present

## 2021-03-12 DIAGNOSIS — Q433 Congenital malformations of intestinal fixation: Secondary | ICD-10-CM

## 2021-03-12 DIAGNOSIS — R1030 Lower abdominal pain, unspecified: Secondary | ICD-10-CM | POA: Diagnosis present

## 2021-03-12 DIAGNOSIS — Y838 Other surgical procedures as the cause of abnormal reaction of the patient, or of later complication, without mention of misadventure at the time of the procedure: Secondary | ICD-10-CM | POA: Diagnosis not present

## 2021-03-12 DIAGNOSIS — R52 Pain, unspecified: Secondary | ICD-10-CM | POA: Diagnosis not present

## 2021-03-12 DIAGNOSIS — E119 Type 2 diabetes mellitus without complications: Secondary | ICD-10-CM | POA: Diagnosis not present

## 2021-03-12 DIAGNOSIS — E1165 Type 2 diabetes mellitus with hyperglycemia: Secondary | ICD-10-CM | POA: Diagnosis not present

## 2021-03-12 DIAGNOSIS — J9811 Atelectasis: Secondary | ICD-10-CM | POA: Diagnosis not present

## 2021-03-12 DIAGNOSIS — Z833 Family history of diabetes mellitus: Secondary | ICD-10-CM

## 2021-03-12 DIAGNOSIS — D62 Acute posthemorrhagic anemia: Secondary | ICD-10-CM | POA: Diagnosis not present

## 2021-03-12 DIAGNOSIS — Z743 Need for continuous supervision: Secondary | ICD-10-CM | POA: Diagnosis not present

## 2021-03-12 DIAGNOSIS — L7632 Postprocedural hematoma of skin and subcutaneous tissue following other procedure: Secondary | ICD-10-CM | POA: Diagnosis not present

## 2021-03-12 DIAGNOSIS — J45909 Unspecified asthma, uncomplicated: Secondary | ICD-10-CM | POA: Diagnosis not present

## 2021-03-12 DIAGNOSIS — R079 Chest pain, unspecified: Secondary | ICD-10-CM | POA: Diagnosis not present

## 2021-03-12 DIAGNOSIS — E669 Obesity, unspecified: Secondary | ICD-10-CM | POA: Diagnosis present

## 2021-03-12 DIAGNOSIS — Z981 Arthrodesis status: Secondary | ICD-10-CM | POA: Diagnosis not present

## 2021-03-12 DIAGNOSIS — Z9889 Other specified postprocedural states: Secondary | ICD-10-CM | POA: Diagnosis not present

## 2021-03-12 DIAGNOSIS — Z20822 Contact with and (suspected) exposure to covid-19: Secondary | ICD-10-CM | POA: Diagnosis not present

## 2021-03-12 DIAGNOSIS — R1110 Vomiting, unspecified: Secondary | ICD-10-CM

## 2021-03-12 DIAGNOSIS — J438 Other emphysema: Secondary | ICD-10-CM | POA: Diagnosis not present

## 2021-03-12 DIAGNOSIS — K5909 Other constipation: Secondary | ICD-10-CM | POA: Diagnosis present

## 2021-03-12 DIAGNOSIS — Z7401 Bed confinement status: Secondary | ICD-10-CM | POA: Diagnosis not present

## 2021-03-12 DIAGNOSIS — R109 Unspecified abdominal pain: Secondary | ICD-10-CM | POA: Diagnosis present

## 2021-03-12 DIAGNOSIS — I10 Essential (primary) hypertension: Secondary | ICD-10-CM | POA: Diagnosis not present

## 2021-03-12 DIAGNOSIS — R6510 Systemic inflammatory response syndrome (SIRS) of non-infectious origin without acute organ dysfunction: Secondary | ICD-10-CM | POA: Diagnosis present

## 2021-03-12 DIAGNOSIS — K430 Incisional hernia with obstruction, without gangrene: Secondary | ICD-10-CM | POA: Diagnosis not present

## 2021-03-12 DIAGNOSIS — K2970 Gastritis, unspecified, without bleeding: Secondary | ICD-10-CM | POA: Diagnosis present

## 2021-03-12 DIAGNOSIS — K9189 Other postprocedural complications and disorders of digestive system: Secondary | ICD-10-CM

## 2021-03-12 DIAGNOSIS — K660 Peritoneal adhesions (postprocedural) (postinfection): Secondary | ICD-10-CM | POA: Diagnosis present

## 2021-03-12 DIAGNOSIS — Z66 Do not resuscitate: Secondary | ICD-10-CM | POA: Diagnosis not present

## 2021-03-12 DIAGNOSIS — I444 Left anterior fascicular block: Secondary | ICD-10-CM | POA: Diagnosis not present

## 2021-03-12 DIAGNOSIS — M1990 Unspecified osteoarthritis, unspecified site: Secondary | ICD-10-CM | POA: Diagnosis present

## 2021-03-12 DIAGNOSIS — E872 Acidosis: Secondary | ICD-10-CM | POA: Diagnosis present

## 2021-03-12 DIAGNOSIS — Z794 Long term (current) use of insulin: Secondary | ICD-10-CM

## 2021-03-12 DIAGNOSIS — R069 Unspecified abnormalities of breathing: Secondary | ICD-10-CM | POA: Diagnosis not present

## 2021-03-12 DIAGNOSIS — M6281 Muscle weakness (generalized): Secondary | ICD-10-CM | POA: Diagnosis not present

## 2021-03-12 DIAGNOSIS — R1111 Vomiting without nausea: Secondary | ICD-10-CM | POA: Diagnosis not present

## 2021-03-12 DIAGNOSIS — R0602 Shortness of breath: Secondary | ICD-10-CM | POA: Diagnosis not present

## 2021-03-12 DIAGNOSIS — Z6831 Body mass index (BMI) 31.0-31.9, adult: Secondary | ICD-10-CM | POA: Diagnosis not present

## 2021-03-12 DIAGNOSIS — Q7959 Other congenital malformations of abdominal wall: Secondary | ICD-10-CM | POA: Diagnosis not present

## 2021-03-12 DIAGNOSIS — Z801 Family history of malignant neoplasm of trachea, bronchus and lung: Secondary | ICD-10-CM

## 2021-03-12 DIAGNOSIS — K567 Ileus, unspecified: Secondary | ICD-10-CM

## 2021-03-12 DIAGNOSIS — R0989 Other specified symptoms and signs involving the circulatory and respiratory systems: Secondary | ICD-10-CM

## 2021-03-12 DIAGNOSIS — R1010 Upper abdominal pain, unspecified: Secondary | ICD-10-CM | POA: Diagnosis not present

## 2021-03-12 DIAGNOSIS — Z8249 Family history of ischemic heart disease and other diseases of the circulatory system: Secondary | ICD-10-CM

## 2021-03-12 DIAGNOSIS — E1140 Type 2 diabetes mellitus with diabetic neuropathy, unspecified: Secondary | ICD-10-CM | POA: Diagnosis not present

## 2021-03-12 DIAGNOSIS — Z7984 Long term (current) use of oral hypoglycemic drugs: Secondary | ICD-10-CM

## 2021-03-12 DIAGNOSIS — R112 Nausea with vomiting, unspecified: Secondary | ICD-10-CM | POA: Diagnosis not present

## 2021-03-12 DIAGNOSIS — E1149 Type 2 diabetes mellitus with other diabetic neurological complication: Secondary | ICD-10-CM | POA: Diagnosis present

## 2021-03-12 DIAGNOSIS — A084 Viral intestinal infection, unspecified: Secondary | ICD-10-CM | POA: Diagnosis not present

## 2021-03-12 DIAGNOSIS — Z452 Encounter for adjustment and management of vascular access device: Secondary | ICD-10-CM | POA: Diagnosis not present

## 2021-03-12 DIAGNOSIS — Z4682 Encounter for fitting and adjustment of non-vascular catheter: Secondary | ICD-10-CM | POA: Diagnosis not present

## 2021-03-12 DIAGNOSIS — Z79899 Other long term (current) drug therapy: Secondary | ICD-10-CM

## 2021-03-12 DIAGNOSIS — E1169 Type 2 diabetes mellitus with other specified complication: Secondary | ICD-10-CM

## 2021-03-12 DIAGNOSIS — Z823 Family history of stroke: Secondary | ICD-10-CM

## 2021-03-12 DIAGNOSIS — R6889 Other general symptoms and signs: Secondary | ICD-10-CM | POA: Diagnosis not present

## 2021-03-12 DIAGNOSIS — R1310 Dysphagia, unspecified: Secondary | ICD-10-CM | POA: Diagnosis not present

## 2021-03-12 DIAGNOSIS — Z803 Family history of malignant neoplasm of breast: Secondary | ICD-10-CM

## 2021-03-12 LAB — LACTIC ACID, PLASMA
Lactic Acid, Venous: 2.4 mmol/L (ref 0.5–1.9)
Lactic Acid, Venous: 2 mmol/L (ref 0.5–1.9)

## 2021-03-12 LAB — COMPREHENSIVE METABOLIC PANEL
ALT: 20 U/L (ref 0–44)
AST: 24 U/L (ref 15–41)
Albumin: 4.3 g/dL (ref 3.5–5.0)
Alkaline Phosphatase: 66 U/L (ref 38–126)
Anion gap: 10 (ref 5–15)
BUN: 12 mg/dL (ref 8–23)
CO2: 27 mmol/L (ref 22–32)
Calcium: 9.4 mg/dL (ref 8.9–10.3)
Chloride: 97 mmol/L — ABNORMAL LOW (ref 98–111)
Creatinine, Ser: 0.81 mg/dL (ref 0.44–1.00)
GFR, Estimated: >60 mL/min (ref >60)
Glucose, Bld: 178 mg/dL — ABNORMAL HIGH (ref 70–99)
Potassium: 3.7 mmol/L (ref 3.5–5.1)
Sodium: 134 mmol/L — ABNORMAL LOW (ref 135–145)
Total Bilirubin: 0.6 mg/dL (ref 0.3–1.2)
Total Protein: 7.8 g/dL (ref 6.5–8.1)

## 2021-03-12 LAB — CBC WITH DIFFERENTIAL/PLATELET
Abs Immature Granulocytes: 0.18 10*3/uL — ABNORMAL HIGH (ref 0.00–0.07)
Basophils Absolute: 0.1 10*3/uL (ref 0.0–0.1)
Basophils Relative: 0 %
Eosinophils Absolute: 0.2 10*3/uL (ref 0.0–0.5)
Eosinophils Relative: 1 %
HCT: 46.3 % — ABNORMAL HIGH (ref 36.0–46.0)
Hemoglobin: 14.7 g/dL (ref 12.0–15.0)
Immature Granulocytes: 1 %
Lymphocytes Relative: 10 %
Lymphs Abs: 2.3 10*3/uL (ref 0.7–4.0)
MCH: 29.6 pg (ref 26.0–34.0)
MCHC: 31.7 g/dL (ref 30.0–36.0)
MCV: 93.2 fL (ref 80.0–100.0)
Monocytes Absolute: 1.2 10*3/uL — ABNORMAL HIGH (ref 0.1–1.0)
Monocytes Relative: 6 %
Neutro Abs: 17.7 10*3/uL — ABNORMAL HIGH (ref 1.7–7.7)
Neutrophils Relative %: 82 %
Platelets: 257 10*3/uL (ref 150–400)
RBC: 4.97 MIL/uL (ref 3.87–5.11)
RDW: 13.1 % (ref 11.5–15.5)
WBC: 21.7 10*3/uL — ABNORMAL HIGH (ref 4.0–10.5)
nRBC: 0 % (ref 0.0–0.2)

## 2021-03-12 LAB — URINALYSIS, ROUTINE W REFLEX MICROSCOPIC
Bilirubin Urine: NEGATIVE
Glucose, UA: 50 mg/dL — AB
Hgb urine dipstick: NEGATIVE
Ketones, ur: 5 mg/dL — AB
Nitrite: NEGATIVE
Protein, ur: 30 mg/dL — AB
Specific Gravity, Urine: 1.027 (ref 1.005–1.030)
pH: 5 (ref 5.0–8.0)

## 2021-03-12 LAB — RESP PANEL BY RT-PCR (FLU A&B, COVID) ARPGX2
Influenza A by PCR: NEGATIVE
Influenza B by PCR: NEGATIVE
SARS Coronavirus 2 by RT PCR: NEGATIVE

## 2021-03-12 LAB — LIPASE, BLOOD: Lipase: 29 U/L (ref 11–51)

## 2021-03-12 LAB — TROPONIN I (HIGH SENSITIVITY)
Troponin I (High Sensitivity): 7 ng/L (ref <18)
Troponin I (High Sensitivity): 9 ng/L (ref <18)

## 2021-03-12 MED ORDER — ONDANSETRON HCL 4 MG/2ML IJ SOLN: 4.0000 mg | Freq: Four times a day (QID) | INTRAMUSCULAR | Status: DC | PRN

## 2021-03-12 MED ORDER — LABETALOL HCL 5 MG/ML IV SOLN
10.0000 mg | INTRAVENOUS | Status: DC | PRN
  Administered 2021-03-20: 10 mg via INTRAVENOUS
  Filled 2021-03-12: qty 4

## 2021-03-12 MED ORDER — ENOXAPARIN SODIUM 40 MG/0.4ML IJ SOSY
40.0000 mg | PREFILLED_SYRINGE | INTRAMUSCULAR | Status: DC
  Administered 2021-03-12 – 2021-03-14 (×3): 40 mg via SUBCUTANEOUS
  Filled 2021-03-12 (×3): qty 0.4

## 2021-03-12 MED ORDER — IOHEXOL 9 MG/ML PO SOLN
500.0000 mL | ORAL | Status: AC
  Administered 2021-03-12 (×2): 500 mL via ORAL

## 2021-03-12 MED ORDER — ACETAMINOPHEN 650 MG RE SUPP: 650.0000 mg | Freq: Four times a day (QID) | RECTAL | Status: DC | PRN

## 2021-03-12 MED ORDER — PANTOPRAZOLE SODIUM 40 MG IV SOLR
40.0000 mg | Freq: Two times a day (BID) | INTRAVENOUS | Status: DC
  Administered 2021-03-12 – 2021-03-21 (×18): 40 mg via INTRAVENOUS
  Filled 2021-03-12 (×18): qty 40

## 2021-03-12 MED ORDER — ALUM & MAG HYDROXIDE-SIMETH 200-200-20 MG/5ML PO SUSP
30.0000 mL | Freq: Once | ORAL | Status: AC
  Administered 2021-03-12: 30 mL via ORAL
  Filled 2021-03-12: qty 30

## 2021-03-12 MED ORDER — IOHEXOL 9 MG/ML PO SOLN
ORAL | Status: AC
  Filled 2021-03-12: qty 1000

## 2021-03-12 MED ORDER — SODIUM CHLORIDE 0.9 % IV SOLN
2.0000 g | INTRAVENOUS | Status: DC
  Administered 2021-03-12: 2 g via INTRAVENOUS
  Filled 2021-03-12: qty 20

## 2021-03-12 MED ORDER — ACETAMINOPHEN 325 MG PO TABS
650.0000 mg | ORAL_TABLET | Freq: Four times a day (QID) | ORAL | Status: DC | PRN
  Administered 2021-03-16: 650 mg via ORAL
  Filled 2021-03-12: qty 2

## 2021-03-12 MED ORDER — PANTOPRAZOLE SODIUM 40 MG IV SOLR
40.0000 mg | Freq: Once | INTRAVENOUS | Status: AC
  Administered 2021-03-12: 40 mg via INTRAVENOUS
  Filled 2021-03-12: qty 40

## 2021-03-12 MED ORDER — ONDANSETRON HCL 4 MG/2ML IJ SOLN
4.0000 mg | Freq: Once | INTRAMUSCULAR | Status: AC
  Administered 2021-03-12: 4 mg via INTRAVENOUS
  Filled 2021-03-12: qty 2

## 2021-03-12 MED ORDER — ONDANSETRON HCL 4 MG PO TABS: 4.0000 mg | ORAL_TABLET | Freq: Four times a day (QID) | ORAL | Status: DC | PRN

## 2021-03-12 MED ORDER — MORPHINE SULFATE (PF) 2 MG/ML IV SOLN
2.0000 mg | INTRAVENOUS | Status: DC | PRN
Start: 2021-03-12 — End: 2021-03-15
  Administered 2021-03-12: 2 mg via INTRAVENOUS
  Filled 2021-03-12: qty 1

## 2021-03-12 MED ORDER — SODIUM CHLORIDE 0.9 % IV BOLUS
1000.0000 mL | Freq: Once | INTRAVENOUS | Status: AC
  Administered 2021-03-12: 1000 mL via INTRAVENOUS

## 2021-03-12 MED ORDER — METRONIDAZOLE 500 MG/100ML IV SOLN
500.0000 mg | Freq: Three times a day (TID) | INTRAVENOUS | Status: DC
  Administered 2021-03-12 – 2021-03-13 (×3): 500 mg via INTRAVENOUS
  Filled 2021-03-12 (×3): qty 100

## 2021-03-12 MED ORDER — POTASSIUM CHLORIDE IN NACL 20-0.9 MEQ/L-% IV SOLN: INTRAVENOUS | Status: DC

## 2021-03-12 MED ORDER — INSULIN ASPART 100 UNIT/ML IJ SOLN
0.0000 [IU] | Freq: Four times a day (QID) | INTRAMUSCULAR | Status: DC
  Administered 2021-03-13: 1 [IU] via SUBCUTANEOUS
  Administered 2021-03-13: 2 [IU] via SUBCUTANEOUS
  Administered 2021-03-13: 3 [IU] via SUBCUTANEOUS
  Administered 2021-03-14: 2 [IU] via SUBCUTANEOUS
  Administered 2021-03-14: 1 [IU] via SUBCUTANEOUS
  Administered 2021-03-14: 2 [IU] via SUBCUTANEOUS
  Administered 2021-03-14: 1 [IU] via SUBCUTANEOUS
  Administered 2021-03-15: 5 [IU] via SUBCUTANEOUS
  Administered 2021-03-15 – 2021-03-16 (×6): 2 [IU] via SUBCUTANEOUS
  Administered 2021-03-17: 1 [IU] via SUBCUTANEOUS
  Administered 2021-03-17 – 2021-03-18 (×6): 2 [IU] via SUBCUTANEOUS
  Administered 2021-03-19: 3 [IU] via SUBCUTANEOUS
  Administered 2021-03-19 – 2021-03-20 (×4): 2 [IU] via SUBCUTANEOUS
  Administered 2021-03-20 (×2): 3 [IU] via SUBCUTANEOUS
  Administered 2021-03-21 (×2): 2 [IU] via SUBCUTANEOUS

## 2021-03-12 NOTE — ED Notes (Signed)
Patient to CT at this time

## 2021-03-12 NOTE — ED Notes (Signed)
CT aware of patient vomiting contrast.

## 2021-03-12 NOTE — H&P (Signed)
History and Physical    [name] VQX:[id number] DOB: 1951-06-22 DOA: 03/12/2021  PCP: Mirna Mires, MD   Patient coming from: Home  I have personally briefly reviewed patient's old medical records in [facility]  Chief Complaint: Vomiting  HPI: [name] is a 70 y.o. female with medical history significant for diabetes mellitus, hypertension, asthma.  Patient presented to the ED with complaints of abdominal pain, chest pain, vomiting and diarrhea.  She reports midsternal chest pain has been ongoing since discharge from the hospital, she was recently admitted for same, cardiology was consulted, symptoms were thought not consistent with cardiac etiology, her calculated coronary calcium score was 0 by coronary CT 05/2019, GI evaluation was recommended. She describes burning midsternal chest pain worse when she vomits.  She reports onset of vomiting today, she has had 6 episodes of vomiting today, no blood.  She also reports 6 episodes of bloody loose stools today.  She reports associated abdominal pain right lower abdomen, and bloating and intermittent burning pain with urination.  ED Course: Temperature 98.5.  Heart rate 95-103, respiratory rate 14-24.  Blood pressure 130s to 170s.  O2 sats 90 to 95% on room air.  Troponin 7 > 9.  EKG unchanged.  Normal lipase 29.  Abdominal CT without contrast (patient vomited oral contrast)-anterior abdominal wall hernia without evidence of incarceration, and new malnutrition of the small bowel.  No evidence of obstruction. EDP talked to Dr. Henreitta Leber, recommended NG tube placement for now.  Review of Systems: As per HPI all other systems reviewed and negative.  Past Medical History:  Diagnosis Date  . Asthma   . Bronchitis   . Diabetes mellitus   . Hyperlipidemia   . Hypertension   . Interatrial cardiac shunt    a. small secundum ASD vs pulmonary shunt.  . Low back pain   . Migraine   . Mild carotid artery disease (HCC)    . Osteoarthritis   . Sciatica   . Varicose veins   . Vertigo     Past Surgical History:  Procedure Laterality Date  . ABDOMINAL HYSTERECTOMY    . BIOPSY  05/12/2019   Procedure: BIOPSY;  Surgeon: West Bali, MD;  Location: [facility];  Service: Endoscopy;;  . CARPAL TUNNEL RELEASE Bilateral   . CESAREAN SECTION    . ESOPHAGOGASTRODUODENOSCOPY (EGD) WITH PROPOFOL N/A 05/12/2019   Procedure: ESOPHAGOGASTRODUODENOSCOPY (EGD) WITH PROPOFOL;  Surgeon: West Bali, MD;  normal-appearing esophagus s/p empiric dilation, moderate gastritis due to ASA s/p biopsy, normal examined duodenum.  Biopsies with gastritis.   . INGUINAL HERNIA REPAIR Left   . LUMBAR FUSION    . POSTERIOR LUMBAR FUSION 4 LEVEL N/A 03/04/2018   Procedure: Right transforaminal lumbar interbody fusion L1-2, L2-3 Posterior fusion T10, T11, T12, L1, L2 with T 11, T12 pedicle screws, superior sublaminar hooks T10, local bone graft, allograft cancellous chips Vivigen;  Surgeon: Kerrin Champagne, MD;  Location: [facility] OR;  Service: Orthopedics;  Laterality: N/A;  . SAVORY DILATION N/A 05/12/2019   Procedure: SAVORY DILATION;  Surgeon: West Bali, MD;  Location: [facility];  Service: Endoscopy;  Laterality: N/A;     reports that she has never smoked. She has never used smokeless tobacco. She reports that she does not drink alcohol and does not use drugs.  No Known Allergies  Family History  Problem Relation Age of Onset  . Lung cancer Mother   . Breast cancer Mother   . Stroke  Father   . Heart failure Sister   . Diabetes Sister   . Heart failure Sister   . Diabetes Sister   . Colon cancer Neg Hx   . Gastric cancer Neg Hx   . Esophageal cancer Neg Hx     Prior to Admission medications   Medication Sig Start Date End Date Taking? Authorizing Provider  amLODipine (NORVASC) 10 MG tablet Take 1 tablet by mouth daily. 01/02/21   [provider]  aspirin EC 81 MG tablet Take 1 tablet (81 mg total) by mouth  daily with breakfast. 02/15/21 02/15/22  Shon Hale, MD  fluorometholone (FML) 0.1 % ophthalmic suspension 1 drop 3 (three) times daily. 02/24/21   [provider]  gabapentin (NEURONTIN) 100 MG capsule  08/29/20   [provider]  HUMULIN 70/30 (70-30) 100 UNIT/ML injection Inject 66 Units into the skin 2 (two) times daily. 12/07/20   [provider]  hydrochlorothiazide (MICROZIDE) 12.5 MG capsule Take 1 capsule (12.5 mg total) by mouth daily. Patient taking differently: Take 25 mg by mouth daily. 02/24/20   Black, Lesle Chris, NP  losartan (COZAAR) 50 MG tablet Take 50 mg by mouth daily. 04/23/18   [provider]  metFORMIN (GLUCOPHAGE) 1000 MG tablet Take 1,000 mg by mouth at bedtime.    [provider]  nitroGLYCERIN (NITROSTAT) 0.4 MG SL tablet Place 0.4 mg under the tongue every 5 (five) minutes as needed for chest pain. Dissolve one tablet under the tongue every 5 mintues as needed for chest pain.    [provider]  pantoprazole (PROTONIX) 40 MG tablet Take 1 tablet (40 mg total) by mouth daily. 02/15/21 02/15/22  Shon Hale, MD  predniSONE (STERAPRED UNI-PAK 48 TAB) 5 MG (48) TBPK tablet Take by mouth. 02/27/21   [provider]  Semaglutide (RYBELSUS) 3 MG TABS Take 1 tablet by mouth daily.    [provider]  simvastatin (ZOCOR) 20 MG tablet Take 20 mg by mouth at bedtime.    [provider]    Physical Exam: Vitals:   03/12/21 1330 03/12/21 1400 03/12/21 1430 03/12/21 1500  BP: (!) 143/69 (!) 152/69 (!) 148/68 (!) 144/73  Pulse: 94 99 98 95  Resp: 18 14 16 19   Temp:      TempSrc:      SpO2: 94% 93% 95% 93%  Weight:      Height:        Constitutional: NAD, calm, comfortable Vitals:   03/12/21 1330 03/12/21 1400 03/12/21 1430 03/12/21 1500  BP: (!) 143/69 (!) 152/69 (!) 148/68 (!) 144/73  Pulse: 94 99 98 95  Resp: 18 14 16 19   Temp:      TempSrc:      SpO2: 94% 93% 95% 93%  Weight:       Height:       Eyes: PERRL, lids and conjunctivae normal ENMT: Mucous membranes are moist. P  Neck: normal, supple, no masses, no thyromegaly Respiratory: clear to auscultation bilaterally, no wheezing, no crackles. Normal respiratory effort. No accessory muscle use.  Cardiovascular: Regular rate and rhythm, no murmurs / rubs / gallops. No extremity edema. 2+ pedal pulses.  Abdomen: Epigastric and right lower quadrant tenderness, abdomen soft, full but not distended, no masses palpated. No hepatosplenomegaly.  Musculoskeletal: no clubbing / cyanosis. No joint deformity upper and lower extremities. Good ROM, no contractures. Normal muscle tone.  Skin: no rashes, lesions, ulcers. No induration Neurologic: No apparent cranial nerve abnormality, moving extremities spontaneously. Psychiatric:  Normal judgment and insight. Alert and oriented x 3. Normal mood.   Labs on Admission: I have personally reviewed following labs and imaging studies  CBC: Recent Labs  Lab 03/12/21 1121  WBC 21.7*  NEUTROABS 17.7*  HGB 14.7  HCT 46.3*  MCV 93.2  PLT 257   Basic Metabolic Panel: Recent Labs  Lab 03/12/21 1121  NA 134*  K 3.7  CL 97*  CO2 27  GLUCOSE 178*  BUN 12  CREATININE 0.81  CALCIUM 9.4   Liver Function Tests: Recent Labs  Lab 03/12/21 1121  AST 24  ALT 20  ALKPHOS 66  BILITOT 0.6  PROT 7.8  ALBUMIN 4.3   Recent Labs  Lab 03/12/21 1121  LIPASE 29    Radiological Exams on Admission: CT ABDOMEN PELVIS WO CONTRAST  Result Date: 03/12/2021 CLINICAL DATA:  Abdominal pain, nausea vomiting. EXAM: CT ABDOMEN AND PELVIS WITHOUT CONTRAST TECHNIQUE: Multidetector CT imaging of the abdomen and pelvis was performed following the standard protocol without IV contrast. COMPARISON:  November 25, 2020 FINDINGS: Lower chest: Right middle lobe atelectasis. Hepatobiliary: No focal liver abnormality is seen. Status post cholecystectomy. No biliary dilatation. Pancreas: Unremarkable. No  pancreatic ductal dilatation or surrounding inflammatory changes. Spleen: Normal in size without focal abnormality. Adrenals/Urinary Tract: Adrenal glands are unremarkable. Kidneys are normal, without renal calculi, focal lesion, or hydronephrosis. Bladder is unremarkable. Stomach/Bowel: The stomach appears normal. There is known malrotation of the small bowel. No evidence of small-bowel obstruction. There is diastasis safe the anterior abdominal wall. There is also an anterior abdominal wall periumbilical hernia which contains short segments of colon and small bowel. There is no definite evidence of incarceration. Vascular/Lymphatic: Aortic atherosclerosis. No enlarged abdominal or pelvic lymph nodes. Reproductive: Status post hysterectomy. No adnexal masses. Other: No abdominopelvic ascites. Musculoskeletal: Prior spinal fusion.  No acute osseous findings. IMPRESSION: 1. Anterior abdominal wall periumbilical hernia which contains short segments of colon and small bowel. No definite evidence of incarceration. 2. Known malrotation of the small bowel. No evidence of small-bowel obstruction. 3. No evidence of acute abnormalities within the solid abdominal organs. Aortic Atherosclerosis (ICD10-I70.0). Electronically Signed   By: Ted Mcalpine M.D.   On: 03/12/2021 15:02   DG Chest Portable 1 View  Result Date: 03/12/2021 CLINICAL DATA:  Chest pain EXAM: PORTABLE CHEST 1 VIEW COMPARISON:  Feb 14, 2021 FINDINGS: The cardiomediastinal silhouette is unchanged in contour.Unchanged elevation of the RIGHT hemidiaphragm. No pleural effusion. No pneumothorax. Obscure a shin of the LEFT costophrenic angle is likely due to technique and pericardial fat. No acute pleuroparenchymal abnormality. Visualized abdomen is unremarkable. Multilevel degenerative changes of the thoracic spine. Status post posterior fixation of the lower thoracic spine. IMPRESSION: No acute cardiopulmonary abnormality. Electronically Signed   By:  Meda Klinefelter MD   On: 03/12/2021 12:01    EKG: Independently reviewed.  Sinus rhythm.  Rate 96, LAFB. QTc 417.  No significant change from prior.  Assessment/Plan Principal Problem:   Intractable abdominal pain Active Problems:   Diabetes (HCC)   Essential hypertension   Chest pain at rest   Abdominal pain, vomiting.- With associated diarrhea.  Marked leukocytosis of 21.  Heart rate 94-103.  Lactic acidosis of 2.4.  Meeting SIRS criteria.  Abdominal CT shows umbilical hernia without evidence of incarceration, also chronic malnutrition without obstruction.  But CT was done without: Oral or IV contrast.  Reports dysuria, UA shows just trace leukocytes.  -Possible abdominal etiology, differentials include Colitis, gastroenteritis, but considering limitation of CT  Imaging, will start IV ceftriaxone and metronidazole - EDP talked to Dr bridges about Ct findings, NG tube recommended and placed. ? If symptoms are related to umbilical hernia. -Stool C. difficile -Obtain urine culture -N.p.o. - IV morphine 2 mg every 4 hourly as needed -IV Protonix 40 twice daily - 1 L bolus, continue N/s + 20 KCL 100cc/hr x 20hrs - Trend Lactic acid  Chest pain-atypical, aggravated by vomiting.  Troponins EKG unremarkable.  Hospitalization for same, evaluated by cardiology, deemed noncardiac in etiology.  Was discharged on steroids for costochondritis, patient reports no improvement. Likely GI etiology. -IV Protonix  Uncontrolled diabetes mellitus-random glucose 178.  A1c 9.6. - SSI- S q6h -Hold metformin and semaglutide -Hold home 7030 for now while n.p.o.  Hypertension- stable - Hold Norvasc,, losartan, HCTZ held while NPO - IV labetalol 10 PRN  DVT prophylaxis: Lovenox Code Status: DNR Family Communication: Sister at bedside Disposition Plan: ~ 1 - 2 days Consults called: Gen surg Admission status: Obs, tele   Onnie BoerEjiroghene E Keyon Liller MD [facility]  03/12/2021, 5:55 PM

## 2021-03-12 NOTE — ED Triage Notes (Signed)
Pt to er room number 3 via ems, per ems pt is here for chest pain, nausea and vomiting.  States that pt has been vomiting since earlier this am.  Pt states that her pain seems to come and go, states that her pain is worse when she vomits.

## 2021-03-12 NOTE — ED Provider Notes (Signed)
Emergency Department Provider Note   I have reviewed the triage vital signs and the nursing notes.   HISTORY  Chief Complaint Chest Pain   HPI [name] is a 70 y.o. female with past medical history reviewed below including prior evaluations of chest pain, ventral hernias, esophageal stenosis presents to the emergency department with burning chest pain in the setting of vomiting.  Patient states that she has had intermittent chest pain for "a Varun Jourdan time" and that this pain today feels similar to her prior episodes.  She states the pain is worse with vomiting.  It is in the central chest and nonradiating.  She feels some shortness of breath but notes this is also baseline for his.    Patient had an admission earlier this month and was seen by cardiology at that time.  Patient feels like her abdomen is distended.  She notes that she has had some diarrhea as well.  Denies any blood in the emesis or stool.  Past Medical History:  Diagnosis Date  . Asthma   . Bronchitis   . Diabetes mellitus   . Hyperlipidemia   . Hypertension   . Interatrial cardiac shunt    a. small secundum ASD vs pulmonary shunt.  . Low back pain   . Migraine   . Mild carotid artery disease (HCC)   . Osteoarthritis   . Sciatica   . Varicose veins   . Vertigo     Patient Active Problem List   Diagnosis Date Noted  . Intractable abdominal pain 03/12/2021  . Chest pain 02/14/2021  . Ventral hernia without obstruction or gangrene 12/08/2020  . Malrotation of intestine 12/08/2020  . GERD (gastroesophageal reflux disease) 09/22/2020  . Gastritis 09/19/2020  . Other chest pain 09/19/2020  . Reducible bulge of abdominal wall 09/19/2020  . Obesity 02/23/2020  . Chest pain with moderate risk for cardiac etiology 02/22/2020  . Dyspnea on exertion   . Carotid artery stenosis 01/07/2020  . Constipation 09/03/2019  . Dysphagia 08/11/2018  . Leg swelling 08/11/2018  . Bilateral carotid bruits  08/11/2018  . Abnormal EKG 08/06/2018  . Acute blood loss as cause of postoperative anemia 03/07/2018    Class: Acute  . Spinal stenosis, lumbar region with neurogenic claudication 03/04/2018    Class: Chronic  . Multilevel degenerative disc disease 03/04/2018    Class: Chronic  . Forestier's disease of thoracolumbar region 03/04/2018    Class: Chronic  . Fusion of spine of thoracolumbar region 03/04/2018  . Pain in right hip 03/18/2017  . Stiffness of joint, not elsewhere classified, other specified site 08/04/2013  . Leg weakness, bilateral 08/04/2013  . Chest pain at rest 05/10/2009  . BENIGN POSITIONAL VERTIGO 07/30/2007  . VARICOSE VEINS, LOWER EXTREMITIES 07/30/2007  . SCIATICA, CHRONIC 07/30/2007  . Diabetes (HCC) 11/18/2006  . Hyperlipidemia 11/18/2006  . MIGRAINE HEADACHE 11/18/2006  . LESION, SCIATIC NERVE 11/18/2006  . Essential hypertension 11/18/2006  . HYPOTENSION, ORTHOSTATIC 11/18/2006  . BRONCHITIS NOS 11/18/2006  . Other emphysema (HCC) 11/18/2006  . ASTHMA 11/18/2006  . OSTEOARTHRITIS 11/18/2006  . LOW BACK PAIN 11/18/2006  . VERTIGO 11/18/2006    Past Surgical History:  Procedure Laterality Date  . ABDOMINAL HYSTERECTOMY    . BIOPSY  05/12/2019   Procedure: BIOPSY;  Surgeon: West BaliFields, Sandi L, MD;  Location: [facility];  Service: Endoscopy;;  . CARPAL TUNNEL RELEASE Bilateral   . CESAREAN SECTION    . ESOPHAGOGASTRODUODENOSCOPY (EGD) WITH PROPOFOL N/A 05/12/2019  Procedure: ESOPHAGOGASTRODUODENOSCOPY (EGD) WITH PROPOFOL;  Surgeon: West Bali, MD;  normal-appearing esophagus s/p empiric dilation, moderate gastritis due to ASA s/p biopsy, normal examined duodenum.  Biopsies with gastritis.   . INGUINAL HERNIA REPAIR Left   . LUMBAR FUSION    . POSTERIOR LUMBAR FUSION 4 LEVEL N/A 03/04/2018   Procedure: Right transforaminal lumbar interbody fusion L1-2, L2-3 Posterior fusion T10, T11, T12, L1, L2 with T 11, T12 pedicle screws, superior sublaminar hooks  T10, local bone graft, allograft cancellous chips Vivigen;  Surgeon: Kerrin Champagne, MD;  Location: [facility];  Service: Orthopedics;  Laterality: N/A;  . SAVORY DILATION N/A 05/12/2019   Procedure: SAVORY DILATION;  Surgeon: West Bali, MD;  Location: [facility];  Service: Endoscopy;  Laterality: N/A;    Allergies Patient has no known allergies.  Family History  Problem Relation Age of Onset  . Lung cancer Mother   . Breast cancer Mother   . Stroke Father   . Heart failure Sister   . Diabetes Sister   . Heart failure Sister   . Diabetes Sister   . Colon cancer Neg Hx   . Gastric cancer Neg Hx   . Esophageal cancer Neg Hx     Social History Social History   Tobacco Use  . Smoking status: Never Smoker  . Smokeless tobacco: Never Used  Vaping Use  . Vaping Use: Never used  Substance Use Topics  . Alcohol use: No    Alcohol/week: 0.0 standard drinks  . Drug use: No    Review of Systems  Constitutional: No fever/chills Eyes: No visual changes. ENT: No sore throat. Cardiovascular: Positive chest pain. Respiratory: Denies shortness of breath. Gastrointestinal: Positive abdominal pain. Positive nausea and vomiting.  No diarrhea.  No constipation. Genitourinary: Negative for dysuria. Musculoskeletal: Negative for back pain. Skin: Negative for rash. Neurological: Negative for headaches, focal weakness or numbness.  10-point ROS otherwise negative.  ____________________________________________   PHYSICAL EXAM:  VITAL SIGNS: ED Triage Vitals  Enc Vitals Group     BP 03/12/21 1127 (!) 173/68     Pulse Rate 03/12/21 1127 (!) 103     Resp 03/12/21 1127 18     Temp 03/12/21 1127 98.5 F (36.9 C)     Temp Source 03/12/21 1127 Oral     SpO2 03/12/21 1127 95 %     Weight 03/12/21 1124 189 lb (85.7 kg)     Height 03/12/21 1124 5\' 5"  (1.651 m)   Constitutional: Alert and oriented. Well appearing and in no acute distress. Eyes: Conjunctivae are normal. Head:  Atraumatic. Nose: No congestion/rhinnorhea. Mouth/Throat: Mucous membranes are moist.  Neck: No stridor.  Cardiovascular: Normal rate, regular rhythm. Good peripheral circulation. Grossly normal heart sounds.   Respiratory: Normal respiratory effort.  No retractions. Lungs CTAB. Gastrointestinal: Soft with epigastric tenderness.  No rebound or guarding.  Lower abdomen is benign. No distention.  Musculoskeletal: No gross deformities of extremities. Neurologic:  Normal speech and language.  Skin:  Skin is warm, dry and intact. No rash noted.   ____________________________________________   LABS (all labs ordered are listed, but only abnormal results are displayed)  Labs Reviewed  COMPREHENSIVE METABOLIC PANEL - Abnormal; Notable for the following components:      Result Value   Sodium 134 (*)    Chloride 97 (*)    Glucose, Bld 178 (*)    All other components within normal limits  CBC WITH DIFFERENTIAL/PLATELET - Abnormal; Notable for the following components:  WBC 21.7 (*)    HCT 46.3 (*)    Neutro Abs 17.7 (*)    Monocytes Absolute 1.2 (*)    Abs Immature Granulocytes 0.18 (*)    All other components within normal limits  LIPASE, BLOOD  LACTIC ACID, PLASMA  LACTIC ACID, PLASMA  URINALYSIS, ROUTINE W REFLEX MICROSCOPIC  TROPONIN I (HIGH SENSITIVITY)  TROPONIN I (HIGH SENSITIVITY)   ____________________________________________  EKG   EKG Interpretation  Date/Time:  Sunday Mar 12 2021 11:29:02 EDT Ventricular Rate:  104 PR Interval:  152 QRS Duration: 78 QT Interval:  337 QTC Calculation: 444 R Axis:   -51 Text Interpretation: Sinus tachycardia Left anterior fascicular block Confirmed by Alona Bene [phone]) on 03/12/2021 11:38:23 AM      Repeat EKG:    EKG Interpretation  Date/Time:  Sunday Mar 12 2021 13:20:49 EDT Ventricular Rate:  96 PR Interval:  144 QRS Duration: 77 QT Interval:  331 QTC Calculation: 419 R Axis:   -45 Text Interpretation: Sinus  rhythm Left anterior fascicular block No change from triage EKG Confirmed by Alona Bene [phone]) on 03/12/2021 1:28:46 PM       ____________________________________________  RADIOLOGY  CT ABDOMEN PELVIS WO CONTRAST  Result Date: 03/12/2021 CLINICAL DATA:  Abdominal pain, nausea vomiting. EXAM: CT ABDOMEN AND PELVIS WITHOUT CONTRAST TECHNIQUE: Multidetector CT imaging of the abdomen and pelvis was performed following the standard protocol without IV contrast. COMPARISON:  November 25, 2020 FINDINGS: Lower chest: Right middle lobe atelectasis. Hepatobiliary: No focal liver abnormality is seen. Status post cholecystectomy. No biliary dilatation. Pancreas: Unremarkable. No pancreatic ductal dilatation or surrounding inflammatory changes. Spleen: Normal in size without focal abnormality. Adrenals/Urinary Tract: Adrenal glands are unremarkable. Kidneys are normal, without renal calculi, focal lesion, or hydronephrosis. Bladder is unremarkable. Stomach/Bowel: The stomach appears normal. There is known malrotation of the small bowel. No evidence of small-bowel obstruction. There is diastasis safe the anterior abdominal wall. There is also an anterior abdominal wall periumbilical hernia which contains short segments of colon and small bowel. There is no definite evidence of incarceration. Vascular/Lymphatic: Aortic atherosclerosis. No enlarged abdominal or pelvic lymph nodes. Reproductive: Status post hysterectomy. No adnexal masses. Other: No abdominopelvic ascites. Musculoskeletal: Prior spinal fusion.  No acute osseous findings. IMPRESSION: 1. Anterior abdominal wall periumbilical hernia which contains short segments of colon and small bowel. No definite evidence of incarceration. 2. Known malrotation of the small bowel. No evidence of small-bowel obstruction. 3. No evidence of acute abnormalities within the solid abdominal organs. Aortic Atherosclerosis (ICD10-I70.0). Electronically Signed   By: Ted Mcalpine M.D.   On: 03/12/2021 15:02   DG Chest Portable 1 View  Result Date: 03/12/2021 CLINICAL DATA:  Chest pain EXAM: PORTABLE CHEST 1 VIEW COMPARISON:  Feb 14, 2021 FINDINGS: The cardiomediastinal silhouette is unchanged in contour.Unchanged elevation of the RIGHT hemidiaphragm. No pleural effusion. No pneumothorax. Obscure a shin of the LEFT costophrenic angle is likely due to technique and pericardial fat. No acute pleuroparenchymal abnormality. Visualized abdomen is unremarkable. Multilevel degenerative changes of the thoracic spine. Status post posterior fixation of the lower thoracic spine. IMPRESSION: No acute cardiopulmonary abnormality. Electronically Signed   By: Meda Klinefelter MD   On: 03/12/2021 12:01    ____________________________________________   PROCEDURES  Procedure(s) performed:   Procedures  None  ____________________________________________   INITIAL IMPRESSION / ASSESSMENT AND PLAN / ED COURSE  Pertinent labs & imaging results that were available during my care of the patient were reviewed by me and considered in  my medical decision making (see chart for details).   Patient presents to the emergency department with atypical chest pain along with vomiting.  Seems GI in nature pain is worse with vomiting and burning in quality.  Her EKG is reassuring.  She recently saw cardiology during admission earlier this month with chest pain.  Coronary calcium score of 0 in 2020.  Very low suspicion for ACS at present clinically.  Patient does have some abdominal tenderness.  She has a history of ventral hernia and esophageal stenosis requiring dilation.  Plan for CT abdomen pelvis with p.o. contrast.  Will give GI cocktail, Zofran, Protonix.   01:20 PM  Made aware that patient had some additional chest tightness.  The nurse repeated an EKG which was interpreted as above with no change.  Continues to be atypical and GI in nature.  Labs significant for leukocytosis to 21.7.   Kidney function normal.  LFTs and bilirubin are normal.  Lipase normal.  Chest x-ray without acute finding.  Patient is drinking PO contrast for abdominal CT.   03:30 PM  Reviewed the CT findings.  Patient has known malrotation of the small bowel with no evidence of bowel obstruction.  She does have a periumbilical hernia with short segments of colon and small bowel.  No radiographic evidence of incarceration.  She is continuing to have some vomiting here.  Discussed with Dr. Henreitta Leber.  Plan for NG tube placement with active vomiting.  In reexamining the patient's abdomen I do not feel a reducible hernia.  She does have significant leukocytosis.  I have added on a lactate and will follow her urine analysis.  Plan for admit with the hospitalist service.  Dr. Henreitta Leber can see the patient in consult tomorrow.   Discussed patient's case with [facility] to request admission. Patient and family (if present) updated with plan. Care transferred to [facility] service.  I reviewed all nursing notes, vitals, pertinent old records, EKGs, labs, imaging (as available).  ____________________________________________  FINAL CLINICAL IMPRESSION(S) / ED DIAGNOSES  Final diagnoses:  Non-intractable vomiting with nausea, unspecified vomiting type     MEDICATIONS GIVEN DURING THIS VISIT:  Medications  iohexol (OMNIPAQUE) 9 MG/ML oral solution (  Canceled Entry 03/12/21 1237)  ondansetron (ZOFRAN) injection 4 mg (4 mg Intravenous Given 03/12/21 1142)  alum & mag hydroxide-simeth (MAALOX/MYLANTA) 200-200-20 MG/5ML suspension 30 mL (30 mLs Oral Given 03/12/21 1142)  pantoprazole (PROTONIX) injection 40 mg (40 mg Intravenous Given 03/12/21 1142)  iohexol (OMNIPAQUE) 9 MG/ML oral solution 500 mL (500 mLs Oral Contrast Given 03/12/21 1340)     Note:  This document was prepared using Dragon voice recognition software and may include unintentional dictation errors.  Alona Bene, MD, [facility] Emergency Medicine    Chelly Dombeck, Arlyss Repress,  MD 03/12/21 [phone]

## 2021-03-12 NOTE — Progress Notes (Signed)
cbg done on patient. Was 170 at 03-12-21 on 2135

## 2021-03-12 NOTE — ED Notes (Signed)
Hospitalist at bedside at this time. NG tube hooked to low, intermittent suction. Tolerating well.

## 2021-03-12 NOTE — ED Notes (Signed)
Patient vomited large amount of oral contrast at this time. MD notified.

## 2021-03-13 ENCOUNTER — Inpatient Hospital Stay (HOSPITAL_COMMUNITY): Payer: Medicare Other

## 2021-03-13 ENCOUNTER — Encounter (HOSPITAL_COMMUNITY): Payer: Self-pay | Admitting: Family Medicine

## 2021-03-13 DIAGNOSIS — R109 Unspecified abdominal pain: Secondary | ICD-10-CM | POA: Diagnosis not present

## 2021-03-13 DIAGNOSIS — L7632 Postprocedural hematoma of skin and subcutaneous tissue following other procedure: Secondary | ICD-10-CM | POA: Diagnosis not present

## 2021-03-13 DIAGNOSIS — K430 Incisional hernia with obstruction, without gangrene: Secondary | ICD-10-CM | POA: Diagnosis present

## 2021-03-13 DIAGNOSIS — I444 Left anterior fascicular block: Secondary | ICD-10-CM | POA: Diagnosis present

## 2021-03-13 DIAGNOSIS — Q433 Congenital malformations of intestinal fixation: Secondary | ICD-10-CM | POA: Diagnosis not present

## 2021-03-13 DIAGNOSIS — R1030 Lower abdominal pain, unspecified: Secondary | ICD-10-CM | POA: Diagnosis present

## 2021-03-13 DIAGNOSIS — E119 Type 2 diabetes mellitus without complications: Secondary | ICD-10-CM | POA: Diagnosis present

## 2021-03-13 DIAGNOSIS — G43909 Migraine, unspecified, not intractable, without status migrainosus: Secondary | ICD-10-CM | POA: Diagnosis present

## 2021-03-13 DIAGNOSIS — I10 Essential (primary) hypertension: Secondary | ICD-10-CM

## 2021-03-13 DIAGNOSIS — Z66 Do not resuscitate: Secondary | ICD-10-CM | POA: Diagnosis present

## 2021-03-13 DIAGNOSIS — Z20822 Contact with and (suspected) exposure to covid-19: Secondary | ICD-10-CM | POA: Diagnosis present

## 2021-03-13 DIAGNOSIS — E669 Obesity, unspecified: Secondary | ICD-10-CM | POA: Diagnosis present

## 2021-03-13 DIAGNOSIS — E1169 Type 2 diabetes mellitus with other specified complication: Secondary | ICD-10-CM | POA: Diagnosis not present

## 2021-03-13 DIAGNOSIS — M1990 Unspecified osteoarthritis, unspecified site: Secondary | ICD-10-CM | POA: Diagnosis present

## 2021-03-13 DIAGNOSIS — E785 Hyperlipidemia, unspecified: Secondary | ICD-10-CM | POA: Diagnosis present

## 2021-03-13 DIAGNOSIS — K5909 Other constipation: Secondary | ICD-10-CM | POA: Diagnosis present

## 2021-03-13 DIAGNOSIS — J9811 Atelectasis: Secondary | ICD-10-CM | POA: Diagnosis not present

## 2021-03-13 DIAGNOSIS — E872 Acidosis: Secondary | ICD-10-CM | POA: Diagnosis present

## 2021-03-13 DIAGNOSIS — Y838 Other surgical procedures as the cause of abnormal reaction of the patient, or of later complication, without mention of misadventure at the time of the procedure: Secondary | ICD-10-CM | POA: Diagnosis not present

## 2021-03-13 DIAGNOSIS — D62 Acute posthemorrhagic anemia: Secondary | ICD-10-CM | POA: Diagnosis not present

## 2021-03-13 DIAGNOSIS — Z981 Arthrodesis status: Secondary | ICD-10-CM | POA: Diagnosis not present

## 2021-03-13 DIAGNOSIS — R1010 Upper abdominal pain, unspecified: Secondary | ICD-10-CM | POA: Diagnosis not present

## 2021-03-13 DIAGNOSIS — E1165 Type 2 diabetes mellitus with hyperglycemia: Secondary | ICD-10-CM | POA: Diagnosis not present

## 2021-03-13 DIAGNOSIS — R079 Chest pain, unspecified: Secondary | ICD-10-CM | POA: Diagnosis present

## 2021-03-13 DIAGNOSIS — R6510 Systemic inflammatory response syndrome (SIRS) of non-infectious origin without acute organ dysfunction: Secondary | ICD-10-CM | POA: Diagnosis present

## 2021-03-13 DIAGNOSIS — E1149 Type 2 diabetes mellitus with other diabetic neurological complication: Secondary | ICD-10-CM | POA: Diagnosis present

## 2021-03-13 DIAGNOSIS — K2970 Gastritis, unspecified, without bleeding: Secondary | ICD-10-CM | POA: Diagnosis present

## 2021-03-13 DIAGNOSIS — Z794 Long term (current) use of insulin: Secondary | ICD-10-CM | POA: Diagnosis not present

## 2021-03-13 DIAGNOSIS — K219 Gastro-esophageal reflux disease without esophagitis: Secondary | ICD-10-CM | POA: Diagnosis present

## 2021-03-13 DIAGNOSIS — R112 Nausea with vomiting, unspecified: Secondary | ICD-10-CM | POA: Diagnosis not present

## 2021-03-13 DIAGNOSIS — A084 Viral intestinal infection, unspecified: Secondary | ICD-10-CM | POA: Diagnosis present

## 2021-03-13 DIAGNOSIS — R0602 Shortness of breath: Secondary | ICD-10-CM | POA: Diagnosis not present

## 2021-03-13 DIAGNOSIS — K432 Incisional hernia without obstruction or gangrene: Secondary | ICD-10-CM | POA: Diagnosis not present

## 2021-03-13 DIAGNOSIS — R0902 Hypoxemia: Secondary | ICD-10-CM | POA: Diagnosis not present

## 2021-03-13 DIAGNOSIS — Z6831 Body mass index (BMI) 31.0-31.9, adult: Secondary | ICD-10-CM | POA: Diagnosis not present

## 2021-03-13 LAB — CBC
HCT: 40 % (ref 36.0–46.0)
Hemoglobin: 12.5 g/dL (ref 12.0–15.0)
MCH: 29.7 pg (ref 26.0–34.0)
MCHC: 31.3 g/dL (ref 30.0–36.0)
MCV: 95 fL (ref 80.0–100.0)
Platelets: 231 10*3/uL (ref 150–400)
RBC: 4.21 MIL/uL (ref 3.87–5.11)
RDW: 13.5 % (ref 11.5–15.5)
WBC: 10.7 10*3/uL — ABNORMAL HIGH (ref 4.0–10.5)
nRBC: 0 % (ref 0.0–0.2)

## 2021-03-13 LAB — BASIC METABOLIC PANEL
Anion gap: 7 (ref 5–15)
BUN: 15 mg/dL (ref 8–23)
CO2: 28 mmol/L (ref 22–32)
Calcium: 8.2 mg/dL — ABNORMAL LOW (ref 8.9–10.3)
Chloride: 101 mmol/L (ref 98–111)
Creatinine, Ser: 0.86 mg/dL (ref 0.44–1.00)
GFR, Estimated: >60 mL/min (ref >60)
Glucose, Bld: 195 mg/dL — ABNORMAL HIGH (ref 70–99)
Potassium: 4 mmol/L (ref 3.5–5.1)
Sodium: 136 mmol/L (ref 135–145)

## 2021-03-13 LAB — GLUCOSE, CAPILLARY
Glucose-Capillary: 130 mg/dL — ABNORMAL HIGH (ref 70–99)
Glucose-Capillary: 144 mg/dL — ABNORMAL HIGH (ref 70–99)
Glucose-Capillary: 167 mg/dL — ABNORMAL HIGH (ref 70–99)
Glucose-Capillary: 169 mg/dL — ABNORMAL HIGH (ref 70–99)
Glucose-Capillary: 170 mg/dL — ABNORMAL HIGH (ref 70–99)
Glucose-Capillary: 183 mg/dL — ABNORMAL HIGH (ref 70–99)
Glucose-Capillary: 204 mg/dL — ABNORMAL HIGH (ref 70–99)

## 2021-03-13 LAB — HIV ANTIBODY (ROUTINE TESTING W REFLEX): HIV Screen 4th Generation wRfx: NONREACTIVE

## 2021-03-13 MED ORDER — POTASSIUM CHLORIDE IN NACL 20-0.9 MEQ/L-% IV SOLN: INTRAVENOUS | Status: DC

## 2021-03-13 MED ORDER — SODIUM CHLORIDE 0.9 % IV SOLN: INTRAVENOUS | Status: DC

## 2021-03-13 NOTE — H&P (View-Only) (Signed)
[facility] Consult  Reason for Consult: Ventral hernia, abdominal pain, nausea/vomiting, Malrotation  Referring Physician:  Dr. Laural Benes, Dr.Long   Chief Complaint    Chest Pain      HPI: [name] is a 70 y.o. female who is known to me and was referred for ventral hernia with incidentally found malrotation on CT scan. She has some vague complaints of GERD, some constipation at baseline and we have discussed the options of hernia repair and repair of malrotation at the same time given the risk of volvulus and ischemic bowel. She has been reluctant in the past because of needing surgery and her age. She has had issues with anxiety and chest pain and was even sent to the ED from my office with complaints of chest pain and rule out cardiac issue. ECHO and workup were unremarkable. Cardiology saw her last admission.  She came into the hospital overnight with complaints of nausea/vomiting and abdominal pain. NG was placed and has subsequently been dislodged.  She says her pain is better and no nausea. She did feel SOB and a CXR was done that was unremarkable.   Past Medical History:  Diagnosis Date  . Asthma   . Bronchitis   . Diabetes mellitus   . Hyperlipidemia   . Hypertension   . Interatrial cardiac shunt    a. small secundum ASD vs pulmonary shunt.  . Low back pain   . Migraine   . Mild carotid artery disease (HCC)   . Osteoarthritis   . Sciatica   . Varicose veins   . Vertigo     Past Surgical History:  Procedure Laterality Date  . ABDOMINAL HYSTERECTOMY    . BIOPSY  05/12/2019   Procedure: BIOPSY;  Surgeon: West Bali, MD;  Location: [facility];  Service: Endoscopy;;  . CARPAL TUNNEL RELEASE Bilateral   . CESAREAN SECTION    . ESOPHAGOGASTRODUODENOSCOPY (EGD) WITH PROPOFOL N/A 05/12/2019   Procedure: ESOPHAGOGASTRODUODENOSCOPY (EGD) WITH PROPOFOL;  Surgeon: West Bali, MD;  normal-appearing esophagus s/p empiric dilation, moderate  gastritis due to ASA s/p biopsy, normal examined duodenum.  Biopsies with gastritis.   . INGUINAL HERNIA REPAIR Left   . LUMBAR FUSION    . POSTERIOR LUMBAR FUSION 4 LEVEL N/A 03/04/2018   Procedure: Right transforaminal lumbar interbody fusion L1-2, L2-3 Posterior fusion T10, T11, T12, L1, L2 with T 11, T12 pedicle screws, superior sublaminar hooks T10, local bone graft, allograft cancellous chips Vivigen;  Surgeon: Kerrin Champagne, MD;  Location: [facility] OR;  Service: Orthopedics;  Laterality: N/A;  . SAVORY DILATION N/A 05/12/2019   Procedure: SAVORY DILATION;  Surgeon: West Bali, MD;  Location: [facility];  Service: Endoscopy;  Laterality: N/A;    Family History  Problem Relation Age of Onset  . Lung cancer Mother   . Breast cancer Mother   . Stroke Father   . Heart failure Sister   . Diabetes Sister   . Heart failure Sister   . Diabetes Sister   . Colon cancer Neg Hx   . Gastric cancer Neg Hx   . Esophageal cancer Neg Hx     Social History   Tobacco Use  . Smoking status: Never Smoker  . Smokeless tobacco: Never Used  Vaping Use  . Vaping Use: Never used  Substance Use Topics  . Alcohol use: No    Alcohol/week: 0.0 standard drinks  . Drug use: No    Medications:  I have reviewed the  patient's current medications. Prior to Admission:  Medications Prior to Admission  Medication Sig Dispense Refill Last Dose  . amLODipine (NORVASC) 10 MG tablet Take 1 tablet by mouth daily.   03/12/2021 at Unknown time  . aspirin EC 81 MG tablet Take 1 tablet (81 mg total) by mouth daily with breakfast. 30 tablet 5 03/12/2021 at Unknown time  . fluorometholone (FML) 0.1 % ophthalmic suspension 1 drop 3 (three) times daily.   03/12/2021 at Unknown time  . gabapentin (NEURONTIN) 100 MG capsule Take 100 mg by mouth daily as needed (pain).   03/12/2021 at Unknown time  . HUMULIN 70/30 (70-30) 100 UNIT/ML injection Inject 66 Units into the skin 2 (two) times daily.   03/12/2021 at Unknown time   . hydrochlorothiazide (MICROZIDE) 12.5 MG capsule Take 1 capsule (12.5 mg total) by mouth daily. (Patient taking differently: Take 25 mg by mouth daily.) 30 capsule 0 03/12/2021 at Unknown time  . losartan (COZAAR) 50 MG tablet Take 50 mg by mouth daily.  4 03/12/2021 at Unknown time  . metFORMIN (GLUCOPHAGE) 1000 MG tablet Take 1,000 mg by mouth at bedtime.   03/11/2021 at Unknown time  . nitroGLYCERIN (NITROSTAT) 0.4 MG SL tablet Place 0.4 mg under the tongue every 5 (five) minutes as needed for chest pain. Dissolve one tablet under the tongue every 5 mintues as needed for chest pain.     . pantoprazole (PROTONIX) 40 MG tablet Take 1 tablet (40 mg total) by mouth daily. 30 tablet 1 03/12/2021 at Unknown time  . Semaglutide (RYBELSUS) 3 MG TABS Take 1 tablet by mouth daily.   03/12/2021 at Unknown time  . simvastatin (ZOCOR) 20 MG tablet Take 20 mg by mouth at bedtime.   03/11/2021 at Unknown time  . predniSONE (STERAPRED UNI-PAK 48 TAB) 5 MG (48) TBPK tablet Take by mouth. (Patient not taking: No sig reported)   Completed Course at Unknown time   Scheduled: . enoxaparin (LOVENOX) injection  40 mg Subcutaneous Q24H  . insulin aspart  0-9 Units Subcutaneous Q6H  . pantoprazole (PROTONIX) IV  40 mg Intravenous Q12H   Continuous: . sodium chloride     EVO:[id number] **OR** acetaminophen, labetalol, morphine injection, ondansetron **OR** ondansetron (ZOFRAN) IV  No Known Allergies   ROS:  A comprehensive review of systems was negative except for: Respiratory: positive for SOB Gastrointestinal: positive for abdominal pain, constipation, nausea, reflux symptoms and vomiting  Blood pressure 122/69, pulse 91, temperature 99.5 F (37.5 C), temperature source Oral, resp. rate 18, height 5\' 5"  (1.651 m), weight 85.7 kg, SpO2 96 %. Physical Exam Vitals reviewed.  Constitutional:      Appearance: She is obese.  HENT:     Head: Normocephalic.  Eyes:     Extraocular Movements: Extraocular  movements intact.  Cardiovascular:     Rate and Rhythm: Normal rate.     Heart sounds: Normal heart sounds.  Pulmonary:     Effort: Pulmonary effort is normal.  Abdominal:     Palpations: Abdomen is soft.     Tenderness: There is abdominal tenderness. There is no guarding or rebound.     Hernia: A hernia is present. Hernia is present in the ventral area.  Musculoskeletal:     Right lower leg: No edema.  Skin:    General: Skin is warm.  Neurological:     General: No focal deficit present.     Mental Status: She is alert and oriented to person, place, and time.  Psychiatric:  Mood and Affect: Mood normal.        Behavior: Behavior normal.     Results: Results for orders placed or performed during the hospital encounter of 03/12/21 (from the past 48 hour(s))  Comprehensive metabolic panel     Status: Abnormal   Collection Time: 03/12/21 11:21 AM  Result Value Ref Range   Sodium 134 (L) 135 - 145 mmol/L   Potassium 3.7 3.5 - 5.1 mmol/L   Chloride 97 (L) 98 - 111 mmol/L   CO2 27 22 - 32 mmol/L   Glucose, Bld 178 (H) 70 - 99 mg/dL    Comment: Glucose reference range applies only to samples taken after fasting for at least 8 hours.   BUN 12 8 - 23 mg/dL   Creatinine, Ser 1.61 0.44 - 1.00 mg/dL   Calcium 9.4 8.9 - 09.6 mg/dL   Total Protein 7.8 6.5 - 8.1 g/dL   Albumin 4.3 3.5 - 5.0 g/dL   AST 24 15 - 41 U/L   ALT 20 0 - 44 U/L   Alkaline Phosphatase 66 38 - 126 U/L   Total Bilirubin 0.6 0.3 - 1.2 mg/dL   GFR, Estimated >04 >54 mL/min    Comment: (NOTE) Calculated using the CKD-EPI Creatinine Equation (2021)    Anion gap 10 5 - 15    Comment: Performed at [facility], [address], [place], [place] [address]  Lipase, blood     Status: None   Collection Time: 03/12/21 11:21 AM  Result Value Ref Range   Lipase 29 11 - 51 U/L    Comment: Performed at [facility], [address] [place], [place] [address]  CBC with Differential     Status: Abnormal   Collection  Time: 03/12/21 11:21 AM  Result Value Ref Range   WBC 21.7 (H) 4.0 - 10.5 K/uL   RBC 4.97 3.87 - 5.11 MIL/uL   Hemoglobin 14.7 12.0 - 15.0 g/dL   HCT 29.5 (H) 62.1 - 30.8 %   MCV 93.2 80.0 - 100.0 fL   MCH 29.6 26.0 - 34.0 pg   MCHC 31.7 30.0 - 36.0 g/dL   RDW 65.7 84.6 - 96.2 %   Platelets 257 150 - 400 K/uL   nRBC 0.0 0.0 - 0.2 %   Neutrophils Relative % 82 %   Neutro Abs 17.7 (H) 1.7 - 7.7 K/uL   Lymphocytes Relative 10 %   Lymphs Abs 2.3 0.7 - 4.0 K/uL   Monocytes Relative 6 %   Monocytes Absolute 1.2 (H) 0.1 - 1.0 K/uL   Eosinophils Relative 1 %   Eosinophils Absolute 0.2 0.0 - 0.5 K/uL   Basophils Relative 0 %   Basophils Absolute 0.1 0.0 - 0.1 K/uL   Immature Granulocytes 1 %   Abs Immature Granulocytes 0.18 (H) 0.00 - 0.07 K/uL    Comment: Performed at [facility], [address], [place], [place] [address]  Troponin I (High Sensitivity)     Status: None   Collection Time: 03/12/21 11:21 AM  Result Value Ref Range   Troponin I (High Sensitivity) 7 <18 ng/L    Comment: (NOTE) Elevated high sensitivity troponin I (hsTnI) values and significant  changes across serial measurements may suggest ACS but many other  chronic and acute conditions are known to elevate hsTnI results.  Refer to the "Links" section for chest pain algorithms and additional  guidance. Performed at [facility], [address], [place], [place] [address]   Troponin I (High Sensitivity)  Status: None   Collection Time: 03/12/21  1:29 PM  Result Value Ref Range   Troponin I (High Sensitivity) 9 <18 ng/L    Comment: (NOTE) Elevated high sensitivity troponin I (hsTnI) values and significant  changes across serial measurements may suggest ACS but many other  chronic and acute conditions are known to elevate hsTnI results.  Refer to the "Links" section for chest pain algorithms and additional  guidance. Performed at [facility], [address] [place], [place] [address]   Urinalysis, Routine w  reflex microscopic Urine, Clean Catch     Status: Abnormal   Collection Time: 03/12/21  3:21 PM  Result Value Ref Range   Color, Urine YELLOW YELLOW   APPearance HAZY (A) CLEAR   Specific Gravity, Urine 1.027 1.005 - 1.030   pH 5.0 5.0 - 8.0   Glucose, UA 50 (A) NEGATIVE mg/dL   Hgb urine dipstick NEGATIVE NEGATIVE   Bilirubin Urine NEGATIVE NEGATIVE   Ketones, ur 5 (A) NEGATIVE mg/dL   Protein, ur 30 (A) NEGATIVE mg/dL   Nitrite NEGATIVE NEGATIVE   Leukocytes,Ua TRACE (A) NEGATIVE   RBC / HPF 0-5 0 - 5 RBC/hpf   WBC, UA 0-5 0 - 5 WBC/hpf   Bacteria, UA RARE (A) NONE SEEN   Squamous Epithelial / LPF 0-5 0 - 5   Mucus PRESENT    Hyaline Casts, UA PRESENT     Comment: Performed at [facility], [address] [place], [place] [address]  Resp Panel by RT-PCR (Flu A&B, Covid) Nasopharyngeal Swab     Status: None   Collection Time: 03/12/21  3:56 PM   Specimen: Nasopharyngeal Swab; Nasopharyngeal(NP) swabs in vial transport medium  Result Value Ref Range   SARS Coronavirus 2 by RT PCR NEGATIVE NEGATIVE    Comment: (NOTE) SARS-CoV-2 target nucleic acids are NOT DETECTED.  The SARS-CoV-2 RNA is generally detectable in upper respiratory specimens during the acute phase of infection. The lowest concentration of SARS-CoV-2 viral copies this assay can detect is 138 copies/mL. A negative result does not preclude SARS-Cov-2 infection and should not be used as the sole basis for treatment or other patient management decisions. A negative result may occur with  improper specimen collection/handling, submission of specimen other than nasopharyngeal swab, presence of viral mutation(s) within the areas targeted by this assay, and inadequate number of viral copies(<138 copies/mL). A negative result must be combined with clinical observations, patient history, and epidemiological information. The expected result is Negative.  Fact Sheet for Patients:   BloggerCourse.comhttps://www.fda.gov/media/152166/download  Fact Sheet for Healthcare Providers:  SeriousBroker.ithttps://www.fda.gov/media/152162/download  This test is no t yet approved or cleared by the [place] FDA and  has been authorized for detection and/or diagnosis of SARS-CoV-2 by FDA under an Emergency Use Authorization (EUA). This EUA will remain  in effect (meaning this test can be used) for the duration of the COVID-19 declaration under Section 564(b)(1) of the Act, 21 U.S.C.section 360bbb-3(b)(1), unless the authorization is terminated  or revoked sooner.       Influenza A by PCR NEGATIVE NEGATIVE   Influenza B by PCR NEGATIVE NEGATIVE    Comment: (NOTE) The Xpert Xpress SARS-CoV-2/FLU/RSV plus assay is intended as an aid in the diagnosis of influenza from Nasopharyngeal swab specimens and should not be used as a sole basis for treatment. Nasal washings and aspirates are unacceptable for Xpert Xpress SARS-CoV-2/FLU/RSV testing.  Fact Sheet for Patients: BloggerCourse.comhttps://www.fda.gov/media/152166/download  Fact Sheet for Healthcare Providers: SeriousBroker.ithttps://www.fda.gov/media/152162/download  This test is not yet  approved or cleared by the [place] and has been authorized for detection and/or diagnosis of SARS-CoV-2 by FDA under an Emergency Use Authorization (EUA). This EUA will remain in effect (meaning this test can be used) for the duration of the COVID-19 declaration under Section 564(b)(1) of the Act, 21 U.S.C. section 360bbb-3(b)(1), unless the authorization is terminated or revoked.  Performed at [facility], [address] [place], [place] [address]   Lactic acid, plasma     Status: Abnormal   Collection Time: 03/12/21  3:57 PM  Result Value Ref Range   Lactic Acid, Venous 2.4 (HH) 0.5 - 1.9 mmol/L    Comment: CRITICAL RESULT CALLED TO, READ BACK BY AND VERIFIED WITH: CUGINO,M 1635 03/12/2021 COLEMAN,R Performed at [facility], [address], [place], [place] [address]   Lactic  acid, plasma     Status: Abnormal   Collection Time: 03/12/21  7:30 PM  Result Value Ref Range   Lactic Acid, Venous 2.0 (HH) 0.5 - 1.9 mmol/L    Comment: CRITICAL RESULT CALLED TO, READ BACK BY AND VERIFIED WITH: JOHNSON,B 2041 03/12/2021 COLEMAN,R Performed at [facility], [address] [place], [place] [address]   Glucose, capillary     Status: Abnormal   Collection Time: 03/13/21 12:26 AM  Result Value Ref Range   Glucose-Capillary 167 (H) 70 - 99 mg/dL    Comment: Glucose reference range applies only to samples taken after fasting for at least 8 hours.  Basic metabolic panel     Status: Abnormal   Collection Time: 03/13/21  6:11 AM  Result Value Ref Range   Sodium 136 135 - 145 mmol/L   Potassium 4.0 3.5 - 5.1 mmol/L   Chloride 101 98 - 111 mmol/L   CO2 28 22 - 32 mmol/L   Glucose, Bld 195 (H) 70 - 99 mg/dL    Comment: Glucose reference range applies only to samples taken after fasting for at least 8 hours.   BUN 15 8 - 23 mg/dL   Creatinine, Ser 9.14 0.44 - 1.00 mg/dL   Calcium 8.2 (L) 8.9 - 10.3 mg/dL   GFR, Estimated >78 >29 mL/min    Comment: (NOTE) Calculated using the CKD-EPI Creatinine Equation (2021)    Anion gap 7 5 - 15    Comment: Performed at [facility], [address], [place], [place] [address]  CBC     Status: Abnormal   Collection Time: 03/13/21  6:11 AM  Result Value Ref Range   WBC 10.7 (H) 4.0 - 10.5 K/uL   RBC 4.21 3.87 - 5.11 MIL/uL   Hemoglobin 12.5 12.0 - 15.0 g/dL   HCT 08.6 57.8 - 46.9 %   MCV 95.0 80.0 - 100.0 fL   MCH 29.7 26.0 - 34.0 pg   MCHC 31.3 30.0 - 36.0 g/dL   RDW 62.9 52.8 - 41.3 %   Platelets 231 150 - 400 K/uL   nRBC 0.0 0.0 - 0.2 %    Comment: Performed at [facility], [address] [place], [place] [address]  Glucose, capillary     Status: Abnormal   Collection Time: 03/13/21  6:11 AM  Result Value Ref Range   Glucose-Capillary 204 (H) 70 - 99 mg/dL    Comment: Glucose reference range applies only to samples taken  after fasting for at least 8 hours.  Glucose, capillary     Status: Abnormal   Collection Time: 03/13/21  7:47 AM  Result Value Ref Range   Glucose-Capillary 169 (  H) 70 - 99 mg/dL    Comment: Glucose reference range applies only to samples taken after fasting for at least 8 hours.  Glucose, capillary     Status: Abnormal   Collection Time: 03/13/21 11:36 AM  Result Value Ref Range   Glucose-Capillary 183 (H) 70 - 99 mg/dL    Comment: Glucose reference range applies only to samples taken after fasting for at least 8 hours.   Personally reviewed- hernia in the lower abdomen with multiple defects and diastasis, no thickening or signs of incarceration of the bowel  CT ABDOMEN PELVIS WO CONTRAST  Result Date: 03/12/2021 CLINICAL DATA:  Abdominal pain, nausea vomiting. EXAM: CT ABDOMEN AND PELVIS WITHOUT CONTRAST TECHNIQUE: Multidetector CT imaging of the abdomen and pelvis was performed following the standard protocol without IV contrast. COMPARISON:  November 25, 2020 FINDINGS: Lower chest: Right middle lobe atelectasis. Hepatobiliary: No focal liver abnormality is seen. Status post cholecystectomy. No biliary dilatation. Pancreas: Unremarkable. No pancreatic ductal dilatation or surrounding inflammatory changes. Spleen: Normal in size without focal abnormality. Adrenals/Urinary Tract: Adrenal glands are unremarkable. Kidneys are normal, without renal calculi, focal lesion, or hydronephrosis. Bladder is unremarkable. Stomach/Bowel: The stomach appears normal. There is known malrotation of the small bowel. No evidence of small-bowel obstruction. There is diastasis safe the anterior abdominal wall. There is also an anterior abdominal wall periumbilical hernia which contains short segments of colon and small bowel. There is no definite evidence of incarceration. Vascular/Lymphatic: Aortic atherosclerosis. No enlarged abdominal or pelvic lymph nodes. Reproductive: Status post hysterectomy. No adnexal masses.  Other: No abdominopelvic ascites. Musculoskeletal: Prior spinal fusion.  No acute osseous findings. IMPRESSION: 1. Anterior abdominal wall periumbilical hernia which contains short segments of colon and small bowel. No definite evidence of incarceration. 2. Known malrotation of the small bowel. No evidence of small-bowel obstruction. 3. No evidence of acute abnormalities within the solid abdominal organs. Aortic Atherosclerosis (ICD10-I70.0). Electronically Signed   By: Ted Mcalpine M.D.   On: 03/12/2021 15:02   DG Abdomen 1 View  Result Date: 03/12/2021 CLINICAL DATA:  Check gastric catheter placement EXAM: ABDOMEN - 1 VIEW COMPARISON:  None. FINDINGS: Gastric catheter is noted deep within the stomach. Postsurgical changes in the thoracolumbar spine are noted. No obstructive changes or free air is seen. IMPRESSION: Gastric catheter deep within the stomach. Electronically Signed   By: Alcide Clever M.D.   On: 03/12/2021 16:09   DG CHEST PORT 1 VIEW  Result Date: 03/13/2021 CLINICAL DATA:  Shortness of breath EXAM: PORTABLE CHEST 1 VIEW COMPARISON:  Mar 12, 2021 FINDINGS: The cardiomediastinal silhouette is unchanged in contour.Unchanged elevation of the RIGHT hemidiaphragm. Unchanged obscuration of the LEFT heart border, likely due to pericardial fat. No pleural effusion. No pneumothorax. No acute pleuroparenchymal abnormality. Visualized abdomen is unremarkable. Multilevel degenerative changes of the thoracic spine. Status post posterior fixation of the thoracolumbar spine. IMPRESSION: No acute cardiopulmonary abnormality. Electronically Signed   By: Meda Klinefelter MD   On: 03/13/2021 14:51   DG Chest Portable 1 View  Result Date: 03/12/2021 CLINICAL DATA:  Chest pain EXAM: PORTABLE CHEST 1 VIEW COMPARISON:  Feb 14, 2021 FINDINGS: The cardiomediastinal silhouette is unchanged in contour.Unchanged elevation of the RIGHT hemidiaphragm. No pleural effusion. No pneumothorax. Obscure a shin of the  LEFT costophrenic angle is likely due to technique and pericardial fat. No acute pleuroparenchymal abnormality. Visualized abdomen is unremarkable. Multilevel degenerative changes of the thoracic spine. Status post posterior fixation of the lower thoracic spine. IMPRESSION: No acute  cardiopulmonary abnormality. Electronically Signed   By: Meda Klinefelter MD   On: 03/12/2021 12:01     Assessment & Plan:  JAELEN GELLERMAN is a 70 y.o. female with a ventral hernia and abdominal pain and reported nausea and vomiting. No signs of obstruction on the CT but did have vomiting and pain. Patient also with incidental malrotation. We have been in discussion for months regarding repair of hernia and doing a ladd's procedure for the malrotation to make her risk of volvulus less. Discussed reasons for doing both. Discussed risk of bleeding, infection, mesh versus primary hernia repair, possible injury to bowel, discussed this with her and her son, Delila Pereyra. Given that she is in the hospital and we can optimize her and let everyone including GI weigh in before surgery, likely best to just plan for surgery this hospitalization.   -Ice and sips ok  -Unknown etiology of leukocytosis, no obvious finding intraabdominal, UA with some bacteria but not bad, Dr. Laural Benes holding antibiotics for now, down this Am   Plan for Ex lap, ladd's procedure and ventral hernia repair with possible mesh Wednesday   All questions were answered to the satisfaction of the patient and family. Discussed with Dr. Laural Benes.    Lucretia Roers 03/13/2021, 4:31 PM

## 2021-03-13 NOTE — Consult Note (Signed)
[facility] Consult  Reason for Consult: Ventral hernia, abdominal pain, nausea/vomiting, Malrotation  Referring Physician:  Dr. Johnson, Dr.Long   Chief Complaint    Chest Pain      HPI: [name] is a 70 y.o. female who is known to me and was referred for ventral hernia with incidentally found malrotation on CT scan. She has some vague complaints of GERD, some constipation at baseline and we have discussed the options of hernia repair and repair of malrotation at the same time given the risk of volvulus and ischemic bowel. She has been reluctant in the past because of needing surgery and her age. She has had issues with anxiety and chest pain and was even sent to the ED from my office with complaints of chest pain and rule out cardiac issue. ECHO and workup were unremarkable. Cardiology saw her last admission.  She came into the hospital overnight with complaints of nausea/vomiting and abdominal pain. NG was placed and has subsequently been dislodged.  She says her pain is better and no nausea. She did feel SOB and a CXR was done that was unremarkable.   Past Medical History:  Diagnosis Date  . Asthma   . Bronchitis   . Diabetes mellitus   . Hyperlipidemia   . Hypertension   . Interatrial cardiac shunt    a. small secundum ASD vs pulmonary shunt.  . Low back pain   . Migraine   . Mild carotid artery disease (HCC)   . Osteoarthritis   . Sciatica   . Varicose veins   . Vertigo     Past Surgical History:  Procedure Laterality Date  . ABDOMINAL HYSTERECTOMY    . BIOPSY  05/12/2019   Procedure: BIOPSY;  Surgeon: Fields, Sandi L, MD;  Location: [facility];  Service: Endoscopy;;  . CARPAL TUNNEL RELEASE Bilateral   . CESAREAN SECTION    . ESOPHAGOGASTRODUODENOSCOPY (EGD) WITH PROPOFOL N/A 05/12/2019   Procedure: ESOPHAGOGASTRODUODENOSCOPY (EGD) WITH PROPOFOL;  Surgeon: Fields, Sandi L, MD;  normal-appearing esophagus s/p empiric dilation, moderate  gastritis due to ASA s/p biopsy, normal examined duodenum.  Biopsies with gastritis.   . INGUINAL HERNIA REPAIR Left   . LUMBAR FUSION    . POSTERIOR LUMBAR FUSION 4 LEVEL N/A 03/04/2018   Procedure: Right transforaminal lumbar interbody fusion L1-2, L2-3 Posterior fusion T10, T11, T12, L1, L2 with T 11, T12 pedicle screws, superior sublaminar hooks T10, local bone graft, allograft cancellous chips Vivigen;  Surgeon: Nitka, James E, MD;  Location: [facility] OR;  Service: Orthopedics;  Laterality: N/A;  . SAVORY DILATION N/A 05/12/2019   Procedure: SAVORY DILATION;  Surgeon: Fields, Sandi L, MD;  Location: [facility];  Service: Endoscopy;  Laterality: N/A;    Family History  Problem Relation Age of Onset  . Lung cancer Mother   . Breast cancer Mother   . Stroke Father   . Heart failure Sister   . Diabetes Sister   . Heart failure Sister   . Diabetes Sister   . Colon cancer Neg Hx   . Gastric cancer Neg Hx   . Esophageal cancer Neg Hx     Social History   Tobacco Use  . Smoking status: Never Smoker  . Smokeless tobacco: Never Used  Vaping Use  . Vaping Use: Never used  Substance Use Topics  . Alcohol use: No    Alcohol/week: 0.0 standard drinks  . Drug use: No    Medications:  I have reviewed the   patient's current medications. Prior to Admission:  Medications Prior to Admission  Medication Sig Dispense Refill Last Dose  . amLODipine (NORVASC) 10 MG tablet Take 1 tablet by mouth daily.   03/12/2021 at Unknown time  . aspirin EC 81 MG tablet Take 1 tablet (81 mg total) by mouth daily with breakfast. 30 tablet 5 03/12/2021 at Unknown time  . fluorometholone (FML) 0.1 % ophthalmic suspension 1 drop 3 (three) times daily.   03/12/2021 at Unknown time  . gabapentin (NEURONTIN) 100 MG capsule Take 100 mg by mouth daily as needed (pain).   03/12/2021 at Unknown time  . HUMULIN 70/30 (70-30) 100 UNIT/ML injection Inject 66 Units into the skin 2 (two) times daily.   03/12/2021 at Unknown time   . hydrochlorothiazide (MICROZIDE) 12.5 MG capsule Take 1 capsule (12.5 mg total) by mouth daily. (Patient taking differently: Take 25 mg by mouth daily.) 30 capsule 0 03/12/2021 at Unknown time  . losartan (COZAAR) 50 MG tablet Take 50 mg by mouth daily.  4 03/12/2021 at Unknown time  . metFORMIN (GLUCOPHAGE) 1000 MG tablet Take 1,000 mg by mouth at bedtime.   03/11/2021 at Unknown time  . nitroGLYCERIN (NITROSTAT) 0.4 MG SL tablet Place 0.4 mg under the tongue every 5 (five) minutes as needed for chest pain. Dissolve one tablet under the tongue every 5 mintues as needed for chest pain.     . pantoprazole (PROTONIX) 40 MG tablet Take 1 tablet (40 mg total) by mouth daily. 30 tablet 1 03/12/2021 at Unknown time  . Semaglutide (RYBELSUS) 3 MG TABS Take 1 tablet by mouth daily.   03/12/2021 at Unknown time  . simvastatin (ZOCOR) 20 MG tablet Take 20 mg by mouth at bedtime.   03/11/2021 at Unknown time  . predniSONE (STERAPRED UNI-PAK 48 TAB) 5 MG (48) TBPK tablet Take by mouth. (Patient not taking: No sig reported)   Completed Course at Unknown time   Scheduled: . enoxaparin (LOVENOX) injection  40 mg Subcutaneous Q24H  . insulin aspart  0-9 Units Subcutaneous Q6H  . pantoprazole (PROTONIX) IV  40 mg Intravenous Q12H   Continuous: . sodium chloride     PRN:acetaminophen **OR** acetaminophen, labetalol, morphine injection, ondansetron **OR** ondansetron (ZOFRAN) IV  No Known Allergies   ROS:  A comprehensive review of systems was negative except for: Respiratory: positive for SOB Gastrointestinal: positive for abdominal pain, constipation, nausea, reflux symptoms and vomiting  Blood pressure 122/69, pulse 91, temperature 99.5 F (37.5 C), temperature source Oral, resp. rate 18, height 5' 5" (1.651 m), weight 85.7 kg, SpO2 96 %. Physical Exam Vitals reviewed.  Constitutional:      Appearance: She is obese.  HENT:     Head: Normocephalic.  Eyes:     Extraocular Movements: Extraocular  movements intact.  Cardiovascular:     Rate and Rhythm: Normal rate.     Heart sounds: Normal heart sounds.  Pulmonary:     Effort: Pulmonary effort is normal.  Abdominal:     Palpations: Abdomen is soft.     Tenderness: There is abdominal tenderness. There is no guarding or rebound.     Hernia: A hernia is present. Hernia is present in the ventral area.  Musculoskeletal:     Right lower leg: No edema.  Skin:    General: Skin is warm.  Neurological:     General: No focal deficit present.     Mental Status: She is alert and oriented to person, place, and time.  Psychiatric:          Mood and Affect: Mood normal.        Behavior: Behavior normal.     Results: Results for orders placed or performed during the hospital encounter of 03/12/21 (from the past 48 hour(s))  Comprehensive metabolic panel     Status: Abnormal   Collection Time: 03/12/21 11:21 AM  Result Value Ref Range   Sodium 134 (L) 135 - 145 mmol/L   Potassium 3.7 3.5 - 5.1 mmol/L   Chloride 97 (L) 98 - 111 mmol/L   CO2 27 22 - 32 mmol/L   Glucose, Bld 178 (H) 70 - 99 mg/dL    Comment: Glucose reference range applies only to samples taken after fasting for at least 8 hours.   BUN 12 8 - 23 mg/dL   Creatinine, Ser 0.81 0.44 - 1.00 mg/dL   Calcium 9.4 8.9 - 10.3 mg/dL   Total Protein 7.8 6.5 - 8.1 g/dL   Albumin 4.3 3.5 - 5.0 g/dL   AST 24 15 - 41 U/L   ALT 20 0 - 44 U/L   Alkaline Phosphatase 66 38 - 126 U/L   Total Bilirubin 0.6 0.3 - 1.2 mg/dL   GFR, Estimated >60 >60 mL/min    Comment: (NOTE) Calculated using the CKD-EPI Creatinine Equation (2021)    Anion gap 10 5 - 15    Comment: Performed at [facility], [address] [place], [place] [address]  Lipase, blood     Status: None   Collection Time: 03/12/21 11:21 AM  Result Value Ref Range   Lipase 29 11 - 51 U/L    Comment: Performed at [facility], [address] [place], [place] [address]  CBC with Differential     Status: Abnormal   Collection  Time: 03/12/21 11:21 AM  Result Value Ref Range   WBC 21.7 (H) 4.0 - 10.5 K/uL   RBC 4.97 3.87 - 5.11 MIL/uL   Hemoglobin 14.7 12.0 - 15.0 g/dL   HCT 46.3 (H) 36.0 - 46.0 %   MCV 93.2 80.0 - 100.0 fL   MCH 29.6 26.0 - 34.0 pg   MCHC 31.7 30.0 - 36.0 g/dL   RDW 13.1 11.5 - 15.5 %   Platelets 257 150 - 400 K/uL   nRBC 0.0 0.0 - 0.2 %   Neutrophils Relative % 82 %   Neutro Abs 17.7 (H) 1.7 - 7.7 K/uL   Lymphocytes Relative 10 %   Lymphs Abs 2.3 0.7 - 4.0 K/uL   Monocytes Relative 6 %   Monocytes Absolute 1.2 (H) 0.1 - 1.0 K/uL   Eosinophils Relative 1 %   Eosinophils Absolute 0.2 0.0 - 0.5 K/uL   Basophils Relative 0 %   Basophils Absolute 0.1 0.0 - 0.1 K/uL   Immature Granulocytes 1 %   Abs Immature Granulocytes 0.18 (H) 0.00 - 0.07 K/uL    Comment: Performed at [facility], [address] [place], [place] [address]  Troponin I (High Sensitivity)     Status: None   Collection Time: 03/12/21 11:21 AM  Result Value Ref Range   Troponin I (High Sensitivity) 7 <18 ng/L    Comment: (NOTE) Elevated high sensitivity troponin I (hsTnI) values and significant  changes across serial measurements may suggest ACS but many other  chronic and acute conditions are known to elevate hsTnI results.  Refer to the "Links" section for chest pain algorithms and additional  guidance. Performed at [facility], [address] [place], [place] [address]   Troponin I (High Sensitivity)       Status: None   Collection Time: 03/12/21  1:29 PM  Result Value Ref Range   Troponin I (High Sensitivity) 9 <18 ng/L    Comment: (NOTE) Elevated high sensitivity troponin I (hsTnI) values and significant  changes across serial measurements may suggest ACS but many other  chronic and acute conditions are known to elevate hsTnI results.  Refer to the "Links" section for chest pain algorithms and additional  guidance. Performed at [facility], [address] [place], [place] [address]   Urinalysis, Routine w  reflex microscopic Urine, Clean Catch     Status: Abnormal   Collection Time: 03/12/21  3:21 PM  Result Value Ref Range   Color, Urine YELLOW YELLOW   APPearance HAZY (A) CLEAR   Specific Gravity, Urine 1.027 1.005 - 1.030   pH 5.0 5.0 - 8.0   Glucose, UA 50 (A) NEGATIVE mg/dL   Hgb urine dipstick NEGATIVE NEGATIVE   Bilirubin Urine NEGATIVE NEGATIVE   Ketones, ur 5 (A) NEGATIVE mg/dL   Protein, ur 30 (A) NEGATIVE mg/dL   Nitrite NEGATIVE NEGATIVE   Leukocytes,Ua TRACE (A) NEGATIVE   RBC / HPF 0-5 0 - 5 RBC/hpf   WBC, UA 0-5 0 - 5 WBC/hpf   Bacteria, UA RARE (A) NONE SEEN   Squamous Epithelial / LPF 0-5 0 - 5   Mucus PRESENT    Hyaline Casts, UA PRESENT     Comment: Performed at [facility], [address] , [place] [address]  Resp Panel by RT-PCR (Flu A&B, Covid) Nasopharyngeal Swab     Status: None   Collection Time: 03/12/21  3:56 PM   Specimen: Nasopharyngeal Swab; Nasopharyngeal(NP) swabs in vial transport medium  Result Value Ref Range   SARS Coronavirus 2 by RT PCR NEGATIVE NEGATIVE    Comment: (NOTE) SARS-CoV-2 target nucleic acids are NOT DETECTED.  The SARS-CoV-2 RNA is generally detectable in upper respiratory specimens during the acute phase of infection. The lowest concentration of SARS-CoV-2 viral copies this assay can detect is 138 copies/mL. A negative result does not preclude SARS-Cov-2 infection and should not be used as the sole basis for treatment or other patient management decisions. A negative result may occur with  improper specimen collection/handling, submission of specimen other than nasopharyngeal swab, presence of viral mutation(s) within the areas targeted by this assay, and inadequate number of viral copies(<138 copies/mL). A negative result must be combined with clinical observations, patient history, and epidemiological information. The expected result is Negative.  Fact Sheet for Patients:   https://www.fda.gov/media/152166/download  Fact Sheet for Healthcare Providers:  https://www.fda.gov/media/152162/download  This test is no t yet approved or cleared by the [place] FDA and  has been authorized for detection and/or diagnosis of SARS-CoV-2 by FDA under an Emergency Use Authorization (EUA). This EUA will remain  in effect (meaning this test can be used) for the duration of the COVID-19 declaration under Section 564(b)(1) of the Act, 21 U.S.C.section 360bbb-3(b)(1), unless the authorization is terminated  or revoked sooner.       Influenza A by PCR NEGATIVE NEGATIVE   Influenza B by PCR NEGATIVE NEGATIVE    Comment: (NOTE) The Xpert Xpress SARS-CoV-2/FLU/RSV plus assay is intended as an aid in the diagnosis of influenza from Nasopharyngeal swab specimens and should not be used as a sole basis for treatment. Nasal washings and aspirates are unacceptable for Xpert Xpress SARS-CoV-2/FLU/RSV testing.  Fact Sheet for Patients: https://www.fda.gov/media/152166/download  Fact Sheet for Healthcare Providers: https://www.fda.gov/media/152162/download  This test is not yet   approved or cleared by the [place] FDA and has been authorized for detection and/or diagnosis of SARS-CoV-2 by FDA under an Emergency Use Authorization (EUA). This EUA will remain in effect (meaning this test can be used) for the duration of the COVID-19 declaration under Section 564(b)(1) of the Act, 21 U.S.C. section 360bbb-3(b)(1), unless the authorization is terminated or revoked.  Performed at [facility], [address] [place], [place] [address]   Lactic acid, plasma     Status: Abnormal   Collection Time: 03/12/21  3:57 PM  Result Value Ref Range   Lactic Acid, Venous 2.4 (HH) 0.5 - 1.9 mmol/L    Comment: CRITICAL RESULT CALLED TO, READ BACK BY AND VERIFIED WITH: CUGINO,M [id number] 03/12/2021 COLEMAN,R Performed at [facility], [address] [place], [place] [address]   Lactic  acid, plasma     Status: Abnormal   Collection Time: 03/12/21  7:30 PM  Result Value Ref Range   Lactic Acid, Venous 2.0 (HH) 0.5 - 1.9 mmol/L    Comment: CRITICAL RESULT CALLED TO, READ BACK BY AND VERIFIED WITH: JOHNSON,B 2041 03/12/2021 COLEMAN,R Performed at [facility], [address] [place], [place] [address]   Glucose, capillary     Status: Abnormal   Collection Time: 03/13/21 12:26 AM  Result Value Ref Range   Glucose-Capillary 167 (H) 70 - 99 mg/dL    Comment: Glucose reference range applies only to samples taken after fasting for at least 8 hours.  Basic metabolic panel     Status: Abnormal   Collection Time: 03/13/21  6:11 AM  Result Value Ref Range   Sodium 136 135 - 145 mmol/L   Potassium 4.0 3.5 - 5.1 mmol/L   Chloride 101 98 - 111 mmol/L   CO2 28 22 - 32 mmol/L   Glucose, Bld 195 (H) 70 - 99 mg/dL    Comment: Glucose reference range applies only to samples taken after fasting for at least 8 hours.   BUN 15 8 - 23 mg/dL   Creatinine, Ser 0.86 0.44 - 1.00 mg/dL   Calcium 8.2 (L) 8.9 - 10.3 mg/dL   GFR, Estimated >60 >60 mL/min    Comment: (NOTE) Calculated using the CKD-EPI Creatinine Equation (2021)    Anion gap 7 5 - 15    Comment: Performed at [facility], [address] [place], [place] [address]  CBC     Status: Abnormal   Collection Time: 03/13/21  6:11 AM  Result Value Ref Range   WBC 10.7 (H) 4.0 - 10.5 K/uL   RBC 4.21 3.87 - 5.11 MIL/uL   Hemoglobin 12.5 12.0 - 15.0 g/dL   HCT 40.0 36.0 - 46.0 %   MCV 95.0 80.0 - 100.0 fL   MCH 29.7 26.0 - 34.0 pg   MCHC 31.3 30.0 - 36.0 g/dL   RDW 13.5 11.5 - 15.5 %   Platelets 231 150 - 400 K/uL   nRBC 0.0 0.0 - 0.2 %    Comment: Performed at  Hospital, [address] [place], [place] [address]  Glucose, capillary     Status: Abnormal   Collection Time: 03/13/21  6:11 AM  Result Value Ref Range   Glucose-Capillary 204 (H) 70 - 99 mg/dL    Comment: Glucose reference range applies only to samples taken  after fasting for at least 8 hours.  Glucose, capillary     Status: Abnormal   Collection Time: 03/13/21  7:47 AM  Result Value Ref Range   Glucose-Capillary 169 (  H) 70 - 99 mg/dL    Comment: Glucose reference range applies only to samples taken after fasting for at least 8 hours.  Glucose, capillary     Status: Abnormal   Collection Time: 03/13/21 11:36 AM  Result Value Ref Range   Glucose-Capillary 183 (H) 70 - 99 mg/dL    Comment: Glucose reference range applies only to samples taken after fasting for at least 8 hours.   Personally reviewed- hernia in the lower abdomen with multiple defects and diastasis, no thickening or signs of incarceration of the bowel  CT ABDOMEN PELVIS WO CONTRAST  Result Date: 03/12/2021 CLINICAL DATA:  Abdominal pain, nausea vomiting. EXAM: CT ABDOMEN AND PELVIS WITHOUT CONTRAST TECHNIQUE: Multidetector CT imaging of the abdomen and pelvis was performed following the standard protocol without IV contrast. COMPARISON:  November 25, 2020 FINDINGS: Lower chest: Right middle lobe atelectasis. Hepatobiliary: No focal liver abnormality is seen. Status post cholecystectomy. No biliary dilatation. Pancreas: Unremarkable. No pancreatic ductal dilatation or surrounding inflammatory changes. Spleen: Normal in size without focal abnormality. Adrenals/Urinary Tract: Adrenal glands are unremarkable. Kidneys are normal, without renal calculi, focal lesion, or hydronephrosis. Bladder is unremarkable. Stomach/Bowel: The stomach appears normal. There is known malrotation of the small bowel. No evidence of small-bowel obstruction. There is diastasis safe the anterior abdominal wall. There is also an anterior abdominal wall periumbilical hernia which contains short segments of colon and small bowel. There is no definite evidence of incarceration. Vascular/Lymphatic: Aortic atherosclerosis. No enlarged abdominal or pelvic lymph nodes. Reproductive: Status post hysterectomy. No adnexal masses.  Other: No abdominopelvic ascites. Musculoskeletal: Prior spinal fusion.  No acute osseous findings. IMPRESSION: 1. Anterior abdominal wall periumbilical hernia which contains short segments of colon and small bowel. No definite evidence of incarceration. 2. Known malrotation of the small bowel. No evidence of small-bowel obstruction. 3. No evidence of acute abnormalities within the solid abdominal organs. Aortic Atherosclerosis (ICD10-I70.0). Electronically Signed   By: Dobrinka  Dimitrova M.D.   On: 03/12/2021 15:02   DG Abdomen 1 View  Result Date: 03/12/2021 CLINICAL DATA:  Check gastric catheter placement EXAM: ABDOMEN - 1 VIEW COMPARISON:  None. FINDINGS: Gastric catheter is noted deep within the stomach. Postsurgical changes in the thoracolumbar spine are noted. No obstructive changes or free air is seen. IMPRESSION: Gastric catheter deep within the stomach. Electronically Signed   By: Mark  Lukens M.D.   On: 03/12/2021 16:09   DG CHEST PORT 1 VIEW  Result Date: 03/13/2021 CLINICAL DATA:  Shortness of breath EXAM: PORTABLE CHEST 1 VIEW COMPARISON:  Mar 12, 2021 FINDINGS: The cardiomediastinal silhouette is unchanged in contour.Unchanged elevation of the RIGHT hemidiaphragm. Unchanged obscuration of the LEFT heart border, likely due to pericardial fat. No pleural effusion. No pneumothorax. No acute pleuroparenchymal abnormality. Visualized abdomen is unremarkable. Multilevel degenerative changes of the thoracic spine. Status post posterior fixation of the thoracolumbar spine. IMPRESSION: No acute cardiopulmonary abnormality. Electronically Signed   By: Stephanie  Peacock MD   On: 03/13/2021 14:51   DG Chest Portable 1 View  Result Date: 03/12/2021 CLINICAL DATA:  Chest pain EXAM: PORTABLE CHEST 1 VIEW COMPARISON:  Feb 14, 2021 FINDINGS: The cardiomediastinal silhouette is unchanged in contour.Unchanged elevation of the RIGHT hemidiaphragm. No pleural effusion. No pneumothorax. Obscure a shin of the  LEFT costophrenic angle is likely due to technique and pericardial fat. No acute pleuroparenchymal abnormality. Visualized abdomen is unremarkable. Multilevel degenerative changes of the thoracic spine. Status post posterior fixation of the lower thoracic spine. IMPRESSION: No acute   cardiopulmonary abnormality. Electronically Signed   By: Stephanie  Peacock MD   On: 03/12/2021 12:01     Assessment & Plan:  Shawnice L Mathers is a 70 y.o. female with a ventral hernia and abdominal pain and reported nausea and vomiting. No signs of obstruction on the CT but did have vomiting and pain. Patient also with incidental malrotation. We have been in discussion for months regarding repair of hernia and doing a ladd's procedure for the malrotation to make her risk of volvulus less. Discussed reasons for doing both. Discussed risk of bleeding, infection, mesh versus primary hernia repair, possible injury to bowel, discussed this with her and her son, Tyrone. Given that she is in the hospital and we can optimize her and let everyone including GI weigh in before surgery, likely best to just plan for surgery this hospitalization.   -Ice and sips ok  -Unknown etiology of leukocytosis, no obvious finding intraabdominal, UA with some bacteria but not bad, Dr. Johnson holding antibiotics for now, down this Am   Plan for Ex lap, ladd's procedure and ventral hernia repair with possible mesh Wednesday   All questions were answered to the satisfaction of the patient and family. Discussed with Dr. Johnson.    Caree Wolpert C Greysen Devino 03/13/2021, 4:31 PM       

## 2021-03-13 NOTE — Progress Notes (Signed)
PROGRESS NOTE   [name]  ZOX:[id number] DOB: 12/03/1950 DOA: 03/12/2021 PCP: Mirna Mires, MD   Chief Complaint  Patient presents with  . Chest Pain   Level of care: Med-Surg  Brief Admission History:   70 y.o. female with medical history significant for diabetes mellitus, hypertension, asthma.  Patient presented to the ED with complaints of abdominal pain, chest pain, vomiting and diarrhea.  She reports midsternal chest pain has been ongoing since discharge from the hospital, she was recently admitted for same, cardiology was consulted, symptoms were thought not consistent with cardiac etiology, her calculated coronary calcium score was 0 by coronary CT 05/2019, GI evaluation was recommended.  She describes burning midsternal chest pain worse when she vomits.  She reports onset of vomiting today, she has had 6 episodes of vomiting today, no blood.  She also reports 6 episodes of bloody loose stools today.  She reports associated abdominal pain right lower abdomen, and bloating and intermittent burning pain with urination.  Assessment & Plan:   Principal Problem:   Intractable abdominal pain Active Problems:   Diabetes (HCC)   Essential hypertension   Chest pain at rest   Abdominal pain   Abdominal pain - Pt describes symptoms that lead me to believe she needs a GI consultation. Will ask for GI consult.  Continue current management for now.   Nausea and vomiting - Pt has removed her NG tube.  IV meds ordered for symptoms.   Atypical chest pain -her symptoms are suggesting uncontrolled gastritis, continue IV Protonix and request GI consultation  Type 2 diabetes mellitus with neurological complications - continue SSI coverage and frequent CBG  Monitoring.    DVT prophylaxis: enoxaparin  Code Status: full  Family Communication: sister at bedside Disposition: home  Status is: Inpatient  Remains inpatient appropriate because:IV treatments appropriate due to intensity  of illness or inability to take PO and Inpatient level of care appropriate due to severity of illness   Dispo: The patient is from: Home              Anticipated d/c is to: Home              Patient currently is not medically stable to d/c.   Difficult to place patient No   Consultants:   GI   Surgery   Procedures:   NG placement   Antimicrobials:  Ceftriaxone  metronidazole   Subjective: Pt reports ongoing abd pain and nausea, does not like NG tube  Objective: Vitals:   03/12/21 2116 03/13/21 0036 03/13/21 0436 03/13/21 1343  BP: 126/60 137/67 (!) 134/58 122/69  Pulse: (!) 106 99 97 91  Resp: 15 16 16 18   Temp: 99.7 F (37.6 C) 99.8 F (37.7 C) 99.3 F (37.4 C) 99.5 F (37.5 C)  TempSrc: Oral Oral Oral Oral  SpO2: 91% 92% 95% 96%  Weight:      Height:        Intake/Output Summary (Last 24 hours) at 03/13/2021 1616 Last data filed at 03/13/2021 1600 Gross per 24 hour  Intake 1986.03 ml  Output 1500 ml  Net 486.03 ml   Filed Weights   03/12/21 1124  Weight: 85.7 kg    Examination:  General exam: chronically ill appearingAppears calm and comfortable NG tube was in place.  Respiratory system: Clear to auscultation. Respiratory effort normal. Cardiovascular system: normal S1 & S2 heard. No JVD, murmurs, rubs, gallops or clicks. No pedal edema. Gastrointestinal system: Abdomen is mildly distended, soft  and tender. No organomegaly or masses felt. Normal bowel sounds heard. Central nervous system: Alert and oriented. No focal neurological deficits. Extremities: Symmetric 5 x 5 power. Skin: No rashes, lesions or ulcers.   Psychiatry: Judgement and insight appear normal. Mood & affect appropriate.   Data Reviewed: I have personally reviewed following labs and imaging studies  CBC: Recent Labs  Lab 03/12/21 1121 03/13/21 0611  WBC 21.7* 10.7*  NEUTROABS 17.7*  --   HGB 14.7 12.5  HCT 46.3* 40.0  MCV 93.2 95.0  PLT 257 231    Basic Metabolic  Panel: Recent Labs  Lab 03/12/21 1121 03/13/21 0611  NA 134* 136  K 3.7 4.0  CL 97* 101  CO2 27 28  GLUCOSE 178* 195*  BUN 12 15  CREATININE 0.81 0.86  CALCIUM 9.4 8.2*    GFR: Estimated Creatinine Clearance: 65.8 mL/min (by C-G formula based on SCr of 0.86 mg/dL).  Liver Function Tests: Recent Labs  Lab 03/12/21 1121  AST 24  ALT 20  ALKPHOS 66  BILITOT 0.6  PROT 7.8  ALBUMIN 4.3    CBG: Recent Labs  Lab 03/13/21 0026 03/13/21 0611 03/13/21 0747 03/13/21 1136  GLUCAP 167* 204* 169* 183*    Recent Results (from the past 240 hour(s))  Resp Panel by RT-PCR (Flu A&B, Covid) Nasopharyngeal Swab     Status: None   Collection Time: 03/12/21  3:56 PM   Specimen: Nasopharyngeal Swab; Nasopharyngeal(NP) swabs in vial transport medium  Result Value Ref Range Status   SARS Coronavirus 2 by RT PCR NEGATIVE NEGATIVE Final    Comment: (NOTE) SARS-CoV-2 target nucleic acids are NOT DETECTED.  The SARS-CoV-2 RNA is generally detectable in upper respiratory specimens during the acute phase of infection. The lowest concentration of SARS-CoV-2 viral copies this assay can detect is 138 copies/mL. A negative result does not preclude SARS-Cov-2 infection and should not be used as the sole basis for treatment or other patient management decisions. A negative result may occur with  improper specimen collection/handling, submission of specimen other than nasopharyngeal swab, presence of viral mutation(s) within the areas targeted by this assay, and inadequate number of viral copies(<138 copies/mL). A negative result must be combined with clinical observations, patient history, and epidemiological information. The expected result is Negative.  Fact Sheet for Patients:  BloggerCourse.com  Fact Sheet for Healthcare Providers:  SeriousBroker.it  This test is no t yet approved or cleared by the [place] FDA and  has been  authorized for detection and/or diagnosis of SARS-CoV-2 by FDA under an Emergency Use Authorization (EUA). This EUA will remain  in effect (meaning this test can be used) for the duration of the COVID-19 declaration under Section 564(b)(1) of the Act, 21 U.S.C.section 360bbb-3(b)(1), unless the authorization is terminated  or revoked sooner.       Influenza A by PCR NEGATIVE NEGATIVE Final   Influenza B by PCR NEGATIVE NEGATIVE Final    Comment: (NOTE) The Xpert Xpress SARS-CoV-2/FLU/RSV plus assay is intended as an aid in the diagnosis of influenza from Nasopharyngeal swab specimens and should not be used as a sole basis for treatment. Nasal washings and aspirates are unacceptable for Xpert Xpress SARS-CoV-2/FLU/RSV testing.  Fact Sheet for Patients: BloggerCourse.com  Fact Sheet for Healthcare Providers: SeriousBroker.it  This test is not yet approved or cleared by the [place] FDA and has been authorized for detection and/or diagnosis of SARS-CoV-2 by FDA under an Emergency Use Authorization (EUA). This EUA will remain in effect (  meaning this test can be used) for the duration of the COVID-19 declaration under Section 564(b)(1) of the Act, 21 U.S.C. section 360bbb-3(b)(1), unless the authorization is terminated or revoked.  Performed at [facility], [address] [place], [place] [address]      Radiology Studies: CT ABDOMEN PELVIS WO CONTRAST  Result Date: 03/12/2021 CLINICAL DATA:  Abdominal pain, nausea vomiting. EXAM: CT ABDOMEN AND PELVIS WITHOUT CONTRAST TECHNIQUE: Multidetector CT imaging of the abdomen and pelvis was performed following the standard protocol without IV contrast. COMPARISON:  November 25, 2020 FINDINGS: Lower chest: Right middle lobe atelectasis. Hepatobiliary: No focal liver abnormality is seen. Status post cholecystectomy. No biliary dilatation. Pancreas: Unremarkable. No pancreatic ductal  dilatation or surrounding inflammatory changes. Spleen: Normal in size without focal abnormality. Adrenals/Urinary Tract: Adrenal glands are unremarkable. Kidneys are normal, without renal calculi, focal lesion, or hydronephrosis. Bladder is unremarkable. Stomach/Bowel: The stomach appears normal. There is known malrotation of the small bowel. No evidence of small-bowel obstruction. There is diastasis safe the anterior abdominal wall. There is also an anterior abdominal wall periumbilical hernia which contains short segments of colon and small bowel. There is no definite evidence of incarceration. Vascular/Lymphatic: Aortic atherosclerosis. No enlarged abdominal or pelvic lymph nodes. Reproductive: Status post hysterectomy. No adnexal masses. Other: No abdominopelvic ascites. Musculoskeletal: Prior spinal fusion.  No acute osseous findings. IMPRESSION: 1. Anterior abdominal wall periumbilical hernia which contains short segments of colon and small bowel. No definite evidence of incarceration. 2. Known malrotation of the small bowel. No evidence of small-bowel obstruction. 3. No evidence of acute abnormalities within the solid abdominal organs. Aortic Atherosclerosis (ICD10-I70.0). Electronically Signed   By: Ted Mcalpineobrinka  Dimitrova M.D.   On: 03/12/2021 15:02   DG Abdomen 1 View  Result Date: 03/12/2021 CLINICAL DATA:  Check gastric catheter placement EXAM: ABDOMEN - 1 VIEW COMPARISON:  None. FINDINGS: Gastric catheter is noted deep within the stomach. Postsurgical changes in the thoracolumbar spine are noted. No obstructive changes or free air is seen. IMPRESSION: Gastric catheter deep within the stomach. Electronically Signed   By: Alcide CleverMark  Lukens M.D.   On: 03/12/2021 16:09   DG CHEST PORT 1 VIEW  Result Date: 03/13/2021 CLINICAL DATA:  Shortness of breath EXAM: PORTABLE CHEST 1 VIEW COMPARISON:  Mar 12, 2021 FINDINGS: The cardiomediastinal silhouette is unchanged in contour.Unchanged elevation of the RIGHT  hemidiaphragm. Unchanged obscuration of the LEFT heart border, likely due to pericardial fat. No pleural effusion. No pneumothorax. No acute pleuroparenchymal abnormality. Visualized abdomen is unremarkable. Multilevel degenerative changes of the thoracic spine. Status post posterior fixation of the thoracolumbar spine. IMPRESSION: No acute cardiopulmonary abnormality. Electronically Signed   By: Meda KlinefelterStephanie  Peacock MD   On: 03/13/2021 14:51   DG Chest Portable 1 View  Result Date: 03/12/2021 CLINICAL DATA:  Chest pain EXAM: PORTABLE CHEST 1 VIEW COMPARISON:  Feb 14, 2021 FINDINGS: The cardiomediastinal silhouette is unchanged in contour.Unchanged elevation of the RIGHT hemidiaphragm. No pleural effusion. No pneumothorax. Obscure a shin of the LEFT costophrenic angle is likely due to technique and pericardial fat. No acute pleuroparenchymal abnormality. Visualized abdomen is unremarkable. Multilevel degenerative changes of the thoracic spine. Status post posterior fixation of the lower thoracic spine. IMPRESSION: No acute cardiopulmonary abnormality. Electronically Signed   By: Meda KlinefelterStephanie  Peacock MD   On: 03/12/2021 12:01    Scheduled Meds: . enoxaparin (LOVENOX) injection  40 mg Subcutaneous Q24H  . insulin aspart  0-9 Units Subcutaneous Q6H  . pantoprazole (PROTONIX) IV  40 mg Intravenous  Q12H   Continuous Infusions: . 0.9 % NaCl with KCl 20 mEq / L 50 mL/hr at 03/13/21 1601  . cefTRIAXone (ROCEPHIN)  IV Stopped (03/12/21 1831)  . metronidazole 500 mg (03/13/21 1011)     LOS: 0 days   Time spent: 38 mins   Orbie Grupe Laural Benes, MD How to contact the [facility] Attending or Consulting provider 7A - 7P or covering provider during after hours 7P -7A, for this patient?  1. Check the care team in [facility] and look for a) attending/consulting [facility] provider listed and b) the [facility] team listed 2. Log into [email or web address] and use [place]'s universal password to access. If you do not have the password, please contact  the hospital operator. 3. Locate the [facility] provider you are looking for under [facility] and page to a number that you can be directly reached. 4. If you still have difficulty reaching the provider, please page the [facility] (Director on Call) for the Hospitalists listed on amion for assistance.  03/13/2021, 4:16 PM

## 2021-03-14 DIAGNOSIS — R1110 Vomiting, unspecified: Secondary | ICD-10-CM

## 2021-03-14 DIAGNOSIS — R1010 Upper abdominal pain, unspecified: Secondary | ICD-10-CM

## 2021-03-14 DIAGNOSIS — R112 Nausea with vomiting, unspecified: Secondary | ICD-10-CM

## 2021-03-14 DIAGNOSIS — E1165 Type 2 diabetes mellitus with hyperglycemia: Secondary | ICD-10-CM | POA: Diagnosis not present

## 2021-03-14 DIAGNOSIS — Z794 Long term (current) use of insulin: Secondary | ICD-10-CM | POA: Diagnosis not present

## 2021-03-14 DIAGNOSIS — R079 Chest pain, unspecified: Secondary | ICD-10-CM

## 2021-03-14 DIAGNOSIS — I10 Essential (primary) hypertension: Secondary | ICD-10-CM | POA: Diagnosis not present

## 2021-03-14 DIAGNOSIS — E785 Hyperlipidemia, unspecified: Secondary | ICD-10-CM | POA: Diagnosis not present

## 2021-03-14 LAB — CBC WITH DIFFERENTIAL/PLATELET
Abs Immature Granulocytes: 0.02 10*3/uL (ref 0.00–0.07)
Basophils Absolute: 0 10*3/uL (ref 0.0–0.1)
Basophils Relative: 0 %
Eosinophils Absolute: 0.2 10*3/uL (ref 0.0–0.5)
Eosinophils Relative: 3 %
HCT: 38.7 % (ref 36.0–46.0)
Hemoglobin: 12 g/dL (ref 12.0–15.0)
Immature Granulocytes: 0 %
Lymphocytes Relative: 33 %
Lymphs Abs: 2.4 10*3/uL (ref 0.7–4.0)
MCH: 30.1 pg (ref 26.0–34.0)
MCHC: 31 g/dL (ref 30.0–36.0)
MCV: 97 fL (ref 80.0–100.0)
Monocytes Absolute: 0.9 10*3/uL (ref 0.1–1.0)
Monocytes Relative: 12 %
Neutro Abs: 3.8 10*3/uL (ref 1.7–7.7)
Neutrophils Relative %: 52 %
Platelets: 211 10*3/uL (ref 150–400)
RBC: 3.99 MIL/uL (ref 3.87–5.11)
RDW: 14 % (ref 11.5–15.5)
WBC: 7.4 10*3/uL (ref 4.0–10.5)
nRBC: 0 % (ref 0.0–0.2)

## 2021-03-14 LAB — LIPID PANEL
Cholesterol: 92 mg/dL (ref 0–200)
HDL: 20 mg/dL — ABNORMAL LOW (ref >40)
LDL Cholesterol: 38 mg/dL (ref 0–99)
Total CHOL/HDL Ratio: 4.6 RATIO
Triglycerides: 170 mg/dL — ABNORMAL HIGH (ref <150)
VLDL: 34 mg/dL (ref 0–40)

## 2021-03-14 LAB — COMPREHENSIVE METABOLIC PANEL
ALT: 30 U/L (ref 0–44)
AST: 33 U/L (ref 15–41)
Albumin: 3.1 g/dL — ABNORMAL LOW (ref 3.5–5.0)
Alkaline Phosphatase: 46 U/L (ref 38–126)
Anion gap: 7 (ref 5–15)
BUN: 10 mg/dL (ref 8–23)
CO2: 28 mmol/L (ref 22–32)
Calcium: 8 mg/dL — ABNORMAL LOW (ref 8.9–10.3)
Chloride: 103 mmol/L (ref 98–111)
Creatinine, Ser: 0.75 mg/dL (ref 0.44–1.00)
GFR, Estimated: >60 mL/min (ref >60)
Glucose, Bld: 150 mg/dL — ABNORMAL HIGH (ref 70–99)
Potassium: 3.7 mmol/L (ref 3.5–5.1)
Sodium: 138 mmol/L (ref 135–145)
Total Bilirubin: 0.8 mg/dL (ref 0.3–1.2)
Total Protein: 5.8 g/dL — ABNORMAL LOW (ref 6.5–8.1)

## 2021-03-14 LAB — URINE CULTURE

## 2021-03-14 LAB — GLUCOSE, CAPILLARY
Glucose-Capillary: 133 mg/dL — ABNORMAL HIGH (ref 70–99)
Glucose-Capillary: 128 mg/dL — ABNORMAL HIGH (ref 70–99)
Glucose-Capillary: 155 mg/dL — ABNORMAL HIGH (ref 70–99)
Glucose-Capillary: 178 mg/dL — ABNORMAL HIGH (ref 70–99)

## 2021-03-14 LAB — MAGNESIUM: Magnesium: 1.5 mg/dL — ABNORMAL LOW (ref 1.7–2.4)

## 2021-03-14 LAB — LIPASE, BLOOD: Lipase: 23 U/L (ref 11–51)

## 2021-03-14 LAB — TSH: TSH: 1.53 u[IU]/mL (ref 0.350–4.500)

## 2021-03-14 MED ORDER — CHLORHEXIDINE GLUCONATE CLOTH 2 % EX PADS
6.0000 | MEDICATED_PAD | Freq: Once | CUTANEOUS | Status: AC
  Administered 2021-03-14: 6 via TOPICAL

## 2021-03-14 MED ORDER — CEFOTETAN DISODIUM 2 G IJ SOLR
2.0000 g | INTRAMUSCULAR | Status: AC
  Administered 2021-03-15: 2 g via INTRAVENOUS
  Filled 2021-03-14: qty 2

## 2021-03-14 MED ORDER — MAGNESIUM SULFATE 4 GM/100ML IV SOLN
4.0000 g | Freq: Once | INTRAVENOUS | Status: AC
  Administered 2021-03-14: 4 g via INTRAVENOUS
  Filled 2021-03-14: qty 100

## 2021-03-14 NOTE — Progress Notes (Signed)
BS 175 2 units given R arm.

## 2021-03-14 NOTE — Progress Notes (Signed)
[facility] Progress Note     Subjective: Tolerating some fulls but having burning pain in the upper abdomen and pain in the lower abdomen as well.   Objective: Vital signs in last 24 hours: Temp:  [98.2 F (36.8 C)-98.7 F (37.1 C)] 98.2 F (36.8 C) (05/31 1406) Pulse Rate:  [78-82] 78 (05/31 1406) Resp:  [17-18] 17 (05/31 1406) BP: (127-138)/(60-67) 135/60 (05/31 1406) SpO2:  [98 %] 98 % (05/31 0406) Last BM Date: 03/12/21  Intake/Output from previous day: 05/30 0701 - 05/31 0700 In: 2255.2 [I.V.:1980.3; IV Piggyback:274.9] Out: 1100 [Urine:350; Emesis/NG output:750] Intake/Output this shift: Total I/O In: 240 [P.O.:240] Out: -   General appearance: alert, cooperative and no distress GI: tender mid abdomen, hernia   Lab Results:  Recent Labs    03/13/21 0611 03/14/21 0623  WBC 10.7* 7.4  HGB 12.5 12.0  HCT 40.0 38.7  PLT 231 211   BMET Recent Labs    03/13/21 0611 03/14/21 0623  NA 136 138  K 4.0 3.7  CL 101 103  CO2 28 28  GLUCOSE 195* 150*  BUN 15 10  CREATININE 0.86 0.75  CALCIUM 8.2* 8.0*   PT/INR No results for input(s): LABPROT, INR in the last 72 hours.  Studies/Results: DG Abdomen 1 View  Result Date: 03/12/2021 CLINICAL DATA:  Check gastric catheter placement EXAM: ABDOMEN - 1 VIEW COMPARISON:  None. FINDINGS: Gastric catheter is noted deep within the stomach. Postsurgical changes in the thoracolumbar spine are noted. No obstructive changes or free air is seen. IMPRESSION: Gastric catheter deep within the stomach. Electronically Signed   By: Alcide Clever M.D.   On: 03/12/2021 16:09   DG CHEST PORT 1 VIEW  Result Date: 03/13/2021 CLINICAL DATA:  Shortness of breath EXAM: PORTABLE CHEST 1 VIEW COMPARISON:  Mar 12, 2021 FINDINGS: The cardiomediastinal silhouette is unchanged in contour.Unchanged elevation of the RIGHT hemidiaphragm. Unchanged obscuration of the LEFT heart border, likely due to pericardial fat. No pleural  effusion. No pneumothorax. No acute pleuroparenchymal abnormality. Visualized abdomen is unremarkable. Multilevel degenerative changes of the thoracic spine. Status post posterior fixation of the thoracolumbar spine. IMPRESSION: No acute cardiopulmonary abnormality. Electronically Signed   By: Meda Klinefelter MD   On: 03/13/2021 14:51    Anti-infectives: Anti-infectives (From admission, onward)   Start     Dose/Rate Route Frequency Ordered Stop   03/15/21 0600  cefoTEtan (CEFOTAN) 2 g in sodium chloride 0.9 % 100 mL IVPB        2 g 200 mL/hr over 30 Minutes Intravenous On call to O.R. 03/14/21 1521 03/16/21 0559   03/12/21 1830  metroNIDAZOLE (FLAGYL) IVPB 500 mg  Status:  Discontinued        500 mg 100 mL/hr over 60 Minutes Intravenous Every 8 hours 03/12/21 1755 03/13/21 1636   03/12/21 1800  cefTRIAXone (ROCEPHIN) 2 g in sodium chloride 0.9 % 100 mL IVPB  Status:  Discontinued        2 g 200 mL/hr over 30 Minutes Intravenous Every 24 hours 03/12/21 1755 03/13/21 1636      Assessment/Plan: Ms. [place] is a 70 yo with ventral hernia, malrotation and chronic constipation, reflux symptoms, possibly some gastritis also right now and GERD.  She has symptoms that could be attributedto malrotation. Discussed ladd's procedure and ventral hernia repair with her again today.  Preop orders placed NPO midnight Type and screen ECHO 02/2021 reassuring     LOS: 1 day    Lucretia Roers 03/14/2021

## 2021-03-14 NOTE — Consult Note (Signed)
@LOGO @   Referring Provider: [facility]  Primary Care Physician:  , MD Primary Gastroenterologist:  Dr. Mirna Mires  Date of Admission: 03/12/21 Date of Consultation: 03/14/21  Reason for Consultation:  Chronic abdominal pain with nausea/vomiting   HPI:  [name] is a 70 y.o. year old female with medical history significant for diabetes mellitus, hypertension, asthma who presented to the emergency room with chief complaint of abdominal pain, chest pain, vomiting, and diarrhea.   ED Course: Temp 99.5.  Hemodynamically stable.  Hemoglobin 14.7, WBC 21.7, lipase normal, kidney function and BUN within normal limits, glucose 178, sodium 134, potassium 3.7.  Troponins negative.  UA with trace leukocytes and rare bacteria.  Lactic acid 2.4. CT A/P without contrast (patient vomited oral contrast) revealed anterior abdominal wall periumbilical hernia containing short segments of colon and small bowel with no evidence of incarceration, known malrotation of the small bowel without small bowel obstruction.  No acute abnormalities.  She was empirically started on IV ceftriaxone and metronidazole which was later discontinued on 5/30. General surgery was also consulted who recommended NG tube placement.  This was later dislodged.  Surgery has plans for Ex lap, ladd's procedure and ventral hernia repair with possible mesh on 6/1.  Today:  Sunday morning, she got sick after dinner. Started vomiting. Associated SOB. Associated right sided abdominal pain at the hernia site.  Last episode of vomiting was Sunday night. No nausea. No abdominal pain at this time. Sunday she had diarrhea. No diarrhea since then. No BM since then. No brbpr or melena. Passing gas ok.   No sick contacts or antibiotics.    Intermittent nausea started 2 weeks ago. Every other day. No vomiting. Burning in the epigastric area and esophagus. No dysphagia.   No NSAIDs. Took a short course of prednisone after  discharge on 5/3.    No prior colonoscopy. Doesn't want a colonoscopy.   Past Medical History:  Diagnosis Date  . Asthma   . Bronchitis   . Diabetes mellitus   . Hyperlipidemia   . Hypertension   . Interatrial cardiac shunt    a. small secundum ASD vs pulmonary shunt.  . Low back pain   . Migraine   . Mild carotid artery disease (HCC)   . Osteoarthritis   . Sciatica   . Varicose veins   . Vertigo     Past Surgical History:  Procedure Laterality Date  . ABDOMINAL HYSTERECTOMY    . BIOPSY  05/12/2019   Procedure: BIOPSY;  Surgeon: 05/14/2019, MD;  Location: [facility];  Service: Endoscopy;;  . CARPAL TUNNEL RELEASE Bilateral   . CESAREAN SECTION    . ESOPHAGOGASTRODUODENOSCOPY (EGD) WITH PROPOFOL N/A 05/12/2019   Procedure: ESOPHAGOGASTRODUODENOSCOPY (EGD) WITH PROPOFOL;  Surgeon: 05/14/2019, MD;  normal-appearing esophagus s/p empiric dilation, moderate gastritis due to ASA s/p biopsy, normal examined duodenum.  Biopsies with gastritis.   . INGUINAL HERNIA REPAIR Left   . LUMBAR FUSION    . POSTERIOR LUMBAR FUSION 4 LEVEL N/A 03/04/2018   Procedure: Right transforaminal lumbar interbody fusion L1-2, L2-3 Posterior fusion T10, T11, T12, L1, L2 with T 11, T12 pedicle screws, superior sublaminar hooks T10, local bone graft, allograft cancellous chips Vivigen;  Surgeon: 03/06/2018, MD;  Location: [facility];  Service: Orthopedics;  Laterality: N/A;  . SAVORY DILATION N/A 05/12/2019   Procedure: SAVORY DILATION;  Surgeon: 05/14/2019, MD;  Location: [facility];  Service: Endoscopy;  Laterality: N/A;  Prior to Admission medications   Medication Sig Start Date End Date Taking? Authorizing Provider  amLODipine (NORVASC) 10 MG tablet Take 1 tablet by mouth daily. 01/02/21  Yes [provider]  aspirin EC 81 MG tablet Take 1 tablet (81 mg total) by mouth daily with breakfast. 02/15/21 02/15/22 Yes Emokpae, Courage, MD  fluorometholone (FML) 0.1 % ophthalmic  suspension 1 drop 3 (three) times daily. 02/24/21  Yes [provider]  gabapentin (NEURONTIN) 100 MG capsule Take 100 mg by mouth daily as needed (pain). 08/29/20  Yes [provider]  HUMULIN 70/30 (70-30) 100 UNIT/ML injection Inject 66 Units into the skin 2 (two) times daily. 12/07/20  Yes [provider]  hydrochlorothiazide (MICROZIDE) 12.5 MG capsule Take 1 capsule (12.5 mg total) by mouth daily. Patient taking differently: Take 25 mg by mouth daily. 02/24/20  Yes Black, Lesle Chris, NP  losartan (COZAAR) 50 MG tablet Take 50 mg by mouth daily. 04/23/18  Yes [provider]  metFORMIN (GLUCOPHAGE) 1000 MG tablet Take 1,000 mg by mouth at bedtime.   Yes [provider]  nitroGLYCERIN (NITROSTAT) 0.4 MG SL tablet Place 0.4 mg under the tongue every 5 (five) minutes as needed for chest pain. Dissolve one tablet under the tongue every 5 mintues as needed for chest pain.   Yes [provider]  pantoprazole (PROTONIX) 40 MG tablet Take 1 tablet (40 mg total) by mouth daily. 02/15/21 02/15/22 Yes Emokpae, Courage, MD  Semaglutide (RYBELSUS) 3 MG TABS Take 1 tablet by mouth daily.   Yes [provider]  simvastatin (ZOCOR) 20 MG tablet Take 20 mg by mouth at bedtime.   Yes [provider]  predniSONE (STERAPRED UNI-PAK 48 TAB) 5 MG (48) TBPK tablet Take by mouth. Patient not taking: No sig reported 02/27/21   [provider]    Current Facility-Administered Medications  Medication Dose Route Frequency Provider Last Rate Last Admin  . 0.9 %  sodium chloride infusion   Intravenous Continuous Laural Benes, Clanford L, MD 75 mL/hr at 03/14/21 0829 Rate Change at 03/14/21 0829  . acetaminophen (TYLENOL) tablet 650 mg  650 mg Oral Q6H PRN Emokpae, Ejiroghene E, MD       Or  . acetaminophen (TYLENOL) suppository 650 mg  650 mg Rectal Q6H PRN Emokpae, Ejiroghene E, MD      . enoxaparin (LOVENOX) injection 40 mg  40 mg Subcutaneous Q24H  Emokpae, Ejiroghene E, MD   40 mg at 03/13/21 2141  . insulin aspart (novoLOG) injection 0-9 Units  0-9 Units Subcutaneous Q6H Emokpae, Ejiroghene E, MD   1 Units at 03/14/21 0600  . labetalol (NORMODYNE) injection 10 mg  10 mg Intravenous Q2H PRN Emokpae, Ejiroghene E, MD      . morphine 2 MG/ML injection 2 mg  2 mg Intravenous Q4H PRN Emokpae, Ejiroghene E, MD   2 mg at 03/12/21 2131  . ondansetron (ZOFRAN) tablet 4 mg  4 mg Oral Q6H PRN Emokpae, Ejiroghene E, MD       Or  . ondansetron (ZOFRAN) injection 4 mg  4 mg Intravenous Q6H PRN Emokpae, Ejiroghene E, MD      . pantoprazole (PROTONIX) injection 40 mg  40 mg Intravenous Q12H Emokpae, Ejiroghene E, MD   40 mg at 03/13/21 2141    Allergies as of 03/12/2021  . (No Known Allergies)    Family History  Problem Relation Age of Onset  . Lung cancer Mother   . Breast cancer Mother   .  Stroke Father   . Heart failure Sister   . Diabetes Sister   . Heart failure Sister   . Diabetes Sister   . Colon cancer Neg Hx   . Gastric cancer Neg Hx   . Esophageal cancer Neg Hx     Social History   Socioeconomic History  . Marital status: Single    Spouse name: Not on file  . Number of children: Not on file  . Years of education: 12th   . Highest education level: Not on file  Occupational History  . Occupation: Disabled    Employer: UNEMPLOYED  Tobacco Use  . Smoking status: Never Smoker  . Smokeless tobacco: Never Used  Vaping Use  . Vaping Use: Never used  Substance and Sexual Activity  . Alcohol use: No    Alcohol/week: 0.0 standard drinks  . Drug use: No  . Sexual activity: Yes    Birth control/protection: Surgical  Other Topics Concern  . Not on file  Social History Narrative   No caffeine use. 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 2 CH[place]DR[place], 1 IN [employer] [place]ATION[place].   Social Determinants of Health   Financial Resource Strain: Not on file  Food  Insecurity: Not on file  Transportation Needs: Not on file  Physical Activity: Not on file  Stress: Not on file  Social Connections: Not on file  Intimate Partner Violence: Not on file    Review of Systems: Gen: Denies fever, chills.  CV: Denies chest pain or heart palpitations Resp: Admits to new onset SOB.  GI: See HPI GU : Denies urinary burning, urinary frequency, urinary incontinence.  Heme: See HPI  Physical Exam: Vital signs in last 24 hours: Temp:  [98.6 F (37 C)-99.5 F (37.5 C)] 98.6 F (37 C) (05/31 0406) Pulse Rate:  [78-91] 78 (05/31 0406) Resp:  [17-18] 17 (05/31 0406) BP: (122-138)/(67-69) 127/67 (05/31 0406) SpO2:  [96 %-98 %] 98 % (05/31 0406) Last BM Date: 03/12/21 General:   Alert,  Well-developed, well-nourished, pleasant and cooperative in NAD Head:  Normocephalic and atraumatic. Eyes:  Sclera clear, no icterus.   Conjunctiva pink. Ears:  Normal auditory acuity. Lungs:  Few crackles in the lower lung bases. No wheezes or rhonchi. No acute distress. Heart:  Regular rate and rhythm; no murmurs, clicks, rubs,  or gallops. Abdomen:  Soft and nondistended. Mild TTP across the upper abdomen. No rebound or guarding. Periumbilical hernia, soft, mildly tender to palpation. Normal bowel sounds.  Rectal:  Deferred Msk:  Symmetrical without gross deformities. Normal posture.. Extremities:  Without edema. Neurologic:  Alert and  oriented x4;  grossly normal neurologically. Skin:  Intact without significant lesions or rashes. Psych: Normal mood and affect.  Intake/Output from previous day: 05/30 0701 - 05/31 0700 In: 2255.2 [I.V.:1980.3; IV Piggyback:274.9] Out: 1100 [Urine:350; Emesis/NG output:750] Intake/Output this shift: No intake/output data recorded.  Lab Results: Recent Labs    03/12/21 1121 03/13/21 0611 03/14/21 0623  WBC 21.7* 10.7* 7.4  HGB 14.7 12.5 12.0  HCT 46.3* 40.0 38.7  PLT 257 231 211   BMET Recent Labs    03/12/21 1121  03/13/21 0611 03/14/21 0623  NA 134* 136 138  K 3.7 4.0 3.7  CL 97* 101 103  CO2 27 28 28   GLUCOSE 178* 195* 150*  BUN 12 15 10   CREATININE 0.81 0.86 0.75  CALCIUM 9.4 8.2* 8.0*   LFT Recent Labs    03/12/21 1121 03/14/21 [id number]  PROT 7.8 5.8*  ALBUMIN 4.3 3.1*  AST 24 33  ALT 20 30  ALKPHOS 66 46  BILITOT 0.6 0.8   Studies/Results: CT ABDOMEN PELVIS WO CONTRAST  Result Date: 03/12/2021 CLINICAL DATA:  Abdominal pain, nausea vomiting. EXAM: CT ABDOMEN AND PELVIS WITHOUT CONTRAST TECHNIQUE: Multidetector CT imaging of the abdomen and pelvis was performed following the standard protocol without IV contrast. COMPARISON:  November 25, 2020 FINDINGS: Lower chest: Right middle lobe atelectasis. Hepatobiliary: No focal liver abnormality is seen. Status post cholecystectomy. No biliary dilatation. Pancreas: Unremarkable. No pancreatic ductal dilatation or surrounding inflammatory changes. Spleen: Normal in size without focal abnormality. Adrenals/Urinary Tract: Adrenal glands are unremarkable. Kidneys are normal, without renal calculi, focal lesion, or hydronephrosis. Bladder is unremarkable. Stomach/Bowel: The stomach appears normal. There is known malrotation of the small bowel. No evidence of small-bowel obstruction. There is diastasis safe the anterior abdominal wall. There is also an anterior abdominal wall periumbilical hernia which contains short segments of colon and small bowel. There is no definite evidence of incarceration. Vascular/Lymphatic: Aortic atherosclerosis. No enlarged abdominal or pelvic lymph nodes. Reproductive: Status post hysterectomy. No adnexal masses. Other: No abdominopelvic ascites. Musculoskeletal: Prior spinal fusion.  No acute osseous findings. IMPRESSION: 1. Anterior abdominal wall periumbilical hernia which contains short segments of colon and small bowel. No definite evidence of incarceration. 2. Known malrotation of the small bowel. No evidence of small-bowel  obstruction. 3. No evidence of acute abnormalities within the solid abdominal organs. Aortic Atherosclerosis (ICD10-I70.0). Electronically Signed   By: Ted Mcalpine M.D.   On: 03/12/2021 15:02   DG Abdomen 1 View  Result Date: 03/12/2021 CLINICAL DATA:  Check gastric catheter placement EXAM: ABDOMEN - 1 VIEW COMPARISON:  None. FINDINGS: Gastric catheter is noted deep within the stomach. Postsurgical changes in the thoracolumbar spine are noted. No obstructive changes or free air is seen. IMPRESSION: Gastric catheter deep within the stomach. Electronically Signed   By: Alcide Clever M.D.   On: 03/12/2021 16:09   DG CHEST PORT 1 VIEW  Result Date: 03/13/2021 CLINICAL DATA:  Shortness of breath EXAM: PORTABLE CHEST 1 VIEW COMPARISON:  Mar 12, 2021 FINDINGS: The cardiomediastinal silhouette is unchanged in contour.Unchanged elevation of the RIGHT hemidiaphragm. Unchanged obscuration of the LEFT heart border, likely due to pericardial fat. No pleural effusion. No pneumothorax. No acute pleuroparenchymal abnormality. Visualized abdomen is unremarkable. Multilevel degenerative changes of the thoracic spine. Status post posterior fixation of the thoracolumbar spine. IMPRESSION: No acute cardiopulmonary abnormality. Electronically Signed   By: Meda Klinefelter MD   On: 03/13/2021 14:51   DG Chest Portable 1 View  Result Date: 03/12/2021 CLINICAL DATA:  Chest pain EXAM: PORTABLE CHEST 1 VIEW COMPARISON:  Feb 14, 2021 FINDINGS: The cardiomediastinal silhouette is unchanged in contour.Unchanged elevation of the RIGHT hemidiaphragm. No pleural effusion. No pneumothorax. Obscure a shin of the LEFT costophrenic angle is likely due to technique and pericardial fat. No acute pleuroparenchymal abnormality. Visualized abdomen is unremarkable. Multilevel degenerative changes of the thoracic spine. Status post posterior fixation of the lower thoracic spine. IMPRESSION: No acute cardiopulmonary abnormality.  Electronically Signed   By: Meda Klinefelter MD   On: 03/12/2021 12:01    Impression:  70 y.o. year old female with medical history significant fordiabetes mellitus, hypertension, asthma who presented to the emergency room with chief complaint of abdominal pain, chest pain, vomiting, and diarrhea.  She was hemodynamically stable, temp 99.5, hemoglobin 14.7, WBC 21.7, LFTs and lipase within normal limits.  Lactic acid  elevated at 2.4.  CT A/P without contrast (patient vomited oral contrast) with no acute abnormalities.  Known anterior abdominal wall periumbilical hernia and known malrotation of the small bowel without obstruction.  Empirically started on IV ceftriaxone and metronidazole that was discontinued on 5/30.  General surgery consulted with plans for Ex lap, ladd's procedure and ventral hernia repair with possible mesh on 6/1.  GI consulted due to recurrent abdominal pain, nausea, and vomiting.  Clinically, patient has had significant improvement since presenting to the hospital with resolution of nausea, vomiting, and diarrhea.  WBC also returned to normal. Suspect she likely had viral gastroenteritis. Could also have underlying gastroparesis playing a role considering uncontrolled diabetes. However, she does tell me that she has had 2 weeks of intermittent nausea and burning in the epigastric area and esophagus.  Denies BRBPR, melena, or dysphagia.  Chronically on Protonix 40 mg daily which she reports controls her typical GERD symptoms well.  Denies NSAIDs aside from 81 mg aspirin.  Recently took short course of prednisone 20 mg x 5 days starting 5/3 for suspected costochondritis.  Last EGD July 2020 with normal-appearing esophagus s/p empiric dilation, moderate gastritis due to ASA biopsied (gastritis), normal examined duodenum.  Differentials for epigastric and esophageal burning along with intermittent nausea include gastritis, esophagitis, duodenitis, PUD, uncontrolled GERD, gastroparesis.   Discussed role of EGD.  Patient prefers to hold off on this until she has her hernia addressed on 6/1 with Dr. Henreitta Leber.  EGD would likely be outpatient.  Recommend continuing PPI twice daily, GERD diet, and gastroparesis diet.   Plan:   Continue Protonix 40 mg twice daily.  Monitor for return of diarrhea and collect C. diff and GI pathogen panel if this occurs.  Advance to full liquids today pending surgery tomorrow.  Needs better control of diabetes. Would benefit from endocrine consult, can be completed outpatient.  Low fat/low fiber diet moving forward.  4-6 small meals daily.  Avoid NSAIDs.    LOS: 1 day    03/14/2021, 8:32 AM   Ermalinda Memos, PA-C [facility]

## 2021-03-14 NOTE — Progress Notes (Signed)
PROGRESS NOTE   [name]  ZOX:[id number] DOB: 1951/01/30 DOA: 03/12/2021 PCP: Mirna MiresHill, Gerald, MD   Chief Complaint  Patient presents with  . Chest Pain   Level of care: Telemetry  Brief Admission History:   70 y.o. female with medical history significant for diabetes mellitus, hypertension, asthma.  Patient presented to the ED with complaints of abdominal pain, chest pain, vomiting and diarrhea.  She reports midsternal chest pain has been ongoing since discharge from the hospital, she was recently admitted for same, cardiology was consulted, symptoms were thought not consistent with cardiac etiology, her calculated coronary calcium score was 0 by coronary CT 05/2019, GI evaluation was recommended.  She describes burning midsternal chest pain worse when she vomits.  She reports onset of vomiting today, she has had 6 episodes of vomiting today, no blood.  She also reports 6 episodes of bloody loose stools today.  She reports associated abdominal pain right lower abdomen, and bloating and intermittent burning pain with urination.  Assessment & Plan:   Principal Problem:   Intractable abdominal pain Active Problems:   Diabetes (HCC)   Essential hypertension   Chest pain at rest   Abdominal pain   Non-intractable vomiting   Abdominal pain - Pt describes symptoms that lead me to believe she needs a GI consultation. Will ask for GI consult.  Continue current management for now.   Surgery team planning OR on 6/1.    Acute viral gastroenteritis - symptoms seem to have resolved now.  Seen by GI.   Nausea and vomiting - Much improved.   Pt has removed her NG tube.  IV meds ordered for symptoms.   Atypical chest pain -her symptoms are suggesting uncontrolled gastritis, continue IV Protonix and appreciate GI consultation  Type 2 diabetes mellitus with neurological complications - continue SSI coverage and frequent CBG  Monitoring.    Leukocytosis - reactive, resolved now with normal  WBC.   DVT prophylaxis: enoxaparin  Code Status: full  Family Communication: sister at bedside 5/31 Disposition: home  Status is: Inpatient  Remains inpatient appropriate because:IV treatments appropriate due to intensity of illness or inability to take PO and Inpatient level of care appropriate due to severity of illness   Dispo: The patient is from: Home              Anticipated d/c is to: Home              Patient currently is not medically stable to d/c.   Difficult to place patient No   Consultants:   GI   Surgery   Procedures:   NG placement   Antimicrobials:  Ceftriaxone stopped 5/30 metronidazole stopped 5/30  Subjective: Pt had a small episode of emesis with supper, otherwise feeling much better  Objective: Vitals:   03/13/21 0436 03/13/21 1343 03/13/21 2043 03/14/21 0406  BP: (!) 134/58 122/69 138/67 127/67  Pulse: 97 91 82 78  Resp: 16 18 18 17   Temp: 99.3 F (37.4 C) 99.5 F (37.5 C) 98.7 F (37.1 C) 98.6 F (37 C)  TempSrc: Oral Oral Oral Oral  SpO2: 95% 96% 98% 98%  Weight:      Height:        Intake/Output Summary (Last 24 hours) at 03/14/2021 1240 Last data filed at 03/14/2021 0900 Gross per 24 hour  Intake 2255.21 ml  Output 1100 ml  Net 1155.21 ml   Filed Weights   03/12/21 1124  Weight: 85.7 kg    Examination:  General exam: chronically ill appearingAppears calm and comfortable NG tube was in place.  Respiratory system: Clear to auscultation. Respiratory effort normal. Cardiovascular system: normal S1 & S2 heard. No JVD, murmurs, rubs, gallops or clicks. No pedal edema. Gastrointestinal system: Abdomen is mildly distended, soft and tender. No organomegaly or masses felt. Normal bowel sounds heard. Central nervous system: Alert and oriented. No focal neurological deficits. Extremities: Symmetric 5 x 5 power. Skin: No rashes, lesions or ulcers.   Psychiatry: Judgement and insight appear normal. Mood & affect appropriate.   Data  Reviewed: I have personally reviewed following labs and imaging studies  CBC: Recent Labs  Lab 03/12/21 1121 03/13/21 0611 03/14/21 0623  WBC 21.7* 10.7* 7.4  NEUTROABS 17.7*  --  3.8  HGB 14.7 12.5 12.0  HCT 46.3* 40.0 38.7  MCV 93.2 95.0 97.0  PLT 257 231 211    Basic Metabolic Panel: Recent Labs  Lab 03/12/21 1121 03/13/21 0611 03/14/21 0623  NA 134* 136 138  K 3.7 4.0 3.7  CL 97* 101 103  CO2 27 28 28   GLUCOSE 178* 195* 150*  BUN 12 15 10   CREATININE 0.81 0.86 0.75  CALCIUM 9.4 8.2* 8.0*  MG  --   --  1.5*    GFR: Estimated Creatinine Clearance: 70.8 mL/min (by C-G formula based on SCr of 0.75 mg/dL).  Liver Function Tests: Recent Labs  Lab 03/12/21 1121 03/14/21 0623  AST 24 33  ALT 20 30  ALKPHOS 66 46  BILITOT 0.6 0.8  PROT 7.8 5.8*  ALBUMIN 4.3 3.1*    CBG: Recent Labs  Lab 03/13/21 1814 03/13/21 2129 03/14/21 0550 03/14/21 1018 03/14/21 1123  GLUCAP 144* 130* 133* 128* 155*    Recent Results (from the past 240 hour(s))  Culture, Urine     Status: Abnormal   Collection Time: 03/12/21  3:21 PM   Specimen: Urine, Clean Catch  Result Value Ref Range Status   Specimen Description   Final    URINE, CLEAN CATCH Performed at [facility], [address] [place], [address] [place]    Special Requests   Final    NONE Performed at [facility], [address] [place], [address] [place]    Culture MULTIPLE SPECIES PRESENT, SUGGEST RECOLLECTION (A)  Final   Report Status 03/14/2021 FINAL  Final  Resp Panel by RT-PCR (Flu A&B, Covid) Nasopharyngeal Swab     Status: None   Collection Time: 03/12/21  3:56 PM   Specimen: Nasopharyngeal Swab; Nasopharyngeal(NP) swabs in vial transport medium  Result Value Ref Range Status   SARS Coronavirus 2 by RT PCR NEGATIVE NEGATIVE Final    Comment: (NOTE) SARS-CoV-2 target nucleic acids are NOT DETECTED.  The SARS-CoV-2 RNA is generally detectable in upper respiratory specimens during the acute phase of  infection. The lowest concentration of SARS-CoV-2 viral copies this assay can detect is 138 copies/mL. A negative result does not preclude SARS-Cov-2 infection and should not be used as the sole basis for treatment or other patient management decisions. A negative result may occur with  improper specimen collection/handling, submission of specimen other than nasopharyngeal swab, presence of viral mutation(s) within the areas targeted by this assay, and inadequate number of viral copies(<138 copies/mL). A negative result must be combined with clinical observations, patient history, and epidemiological information. The expected result is Negative.  Fact Sheet for Patients:  03/16/2021  Fact Sheet for Healthcare Providers:  03/14/21  This test is no t yet approved or cleared by the  [place] [employer] and  has been authorized for detection and/or diagnosis of SARS-CoV-2 by FDA under an TEFL teacher (EUA). This EUA will remain  in effect (meaning this test can be used) for the duration of the COVID-19 declaration under Section 564(b)(1) of the Act, 21 U.S.C.section 360bbb-3(b)(1), unless the authorization is terminated  or revoked sooner.       Influenza A by PCR NEGATIVE NEGATIVE Final   Influenza B by PCR NEGATIVE NEGATIVE Final    Comment: (NOTE) The Xpert Xpress SARS-CoV-2/FLU/RSV plus assay is intended as an aid in the diagnosis of influenza from Nasopharyngeal swab specimens and should not be used as a sole basis for treatment. Nasal washings and aspirates are unacceptable for Xpert Xpress SARS-CoV-2/FLU/RSV testing.  Fact Sheet for Patients: BloggerCourse.com  Fact Sheet for Healthcare Providers: SeriousBroker.it  This test is not yet approved or cleared by the [place] FDA and has been authorized for detection and/or diagnosis of SARS-CoV-2  by FDA under an Emergency Use Authorization (EUA). This EUA will remain in effect (meaning this test can be used) for the duration of the COVID-19 declaration under Section 564(b)(1) of the Act, 21 U.S.C. section 360bbb-3(b)(1), unless the authorization is terminated or revoked.  Performed at [facility], [address], [place], [place] [address]      Radiology Studies: CT ABDOMEN PELVIS WO CONTRAST  Result Date: 03/12/2021 CLINICAL DATA:  Abdominal pain, nausea vomiting. EXAM: CT ABDOMEN AND PELVIS WITHOUT CONTRAST TECHNIQUE: Multidetector CT imaging of the abdomen and pelvis was performed following the standard protocol without IV contrast. COMPARISON:  November 25, 2020 FINDINGS: Lower chest: Right middle lobe atelectasis. Hepatobiliary: No focal liver abnormality is seen. Status post cholecystectomy. No biliary dilatation. Pancreas: Unremarkable. No pancreatic ductal dilatation or surrounding inflammatory changes. Spleen: Normal in size without focal abnormality. Adrenals/Urinary Tract: Adrenal glands are unremarkable. Kidneys are normal, without renal calculi, focal lesion, or hydronephrosis. Bladder is unremarkable. Stomach/Bowel: The stomach appears normal. There is known malrotation of the small bowel. No evidence of small-bowel obstruction. There is diastasis safe the anterior abdominal wall. There is also an anterior abdominal wall periumbilical hernia which contains short segments of colon and small bowel. There is no definite evidence of incarceration. Vascular/Lymphatic: Aortic atherosclerosis. No enlarged abdominal or pelvic lymph nodes. Reproductive: Status post hysterectomy. No adnexal masses. Other: No abdominopelvic ascites. Musculoskeletal: Prior spinal fusion.  No acute osseous findings. IMPRESSION: 1. Anterior abdominal wall periumbilical hernia which contains short segments of colon and small bowel. No definite evidence of incarceration. 2. Known malrotation of the small bowel.  No evidence of small-bowel obstruction. 3. No evidence of acute abnormalities within the solid abdominal organs. Aortic Atherosclerosis (ICD10-I70.0). Electronically Signed   By: Ted Mcalpine M.D.   On: 03/12/2021 15:02   DG Abdomen 1 View  Result Date: 03/12/2021 CLINICAL DATA:  Check gastric catheter placement EXAM: ABDOMEN - 1 VIEW COMPARISON:  None. FINDINGS: Gastric catheter is noted deep within the stomach. Postsurgical changes in the thoracolumbar spine are noted. No obstructive changes or free air is seen. IMPRESSION: Gastric catheter deep within the stomach. Electronically Signed   By: Alcide Clever M.D.   On: 03/12/2021 16:09   DG CHEST PORT 1 VIEW  Result Date: 03/13/2021 CLINICAL DATA:  Shortness of breath EXAM: PORTABLE CHEST 1 VIEW COMPARISON:  Mar 12, 2021 FINDINGS: The cardiomediastinal silhouette is unchanged in contour.Unchanged elevation of the RIGHT hemidiaphragm. Unchanged obscuration of the LEFT heart border, likely due to pericardial fat. No pleural  effusion. No pneumothorax. No acute pleuroparenchymal abnormality. Visualized abdomen is unremarkable. Multilevel degenerative changes of the thoracic spine. Status post posterior fixation of the thoracolumbar spine. IMPRESSION: No acute cardiopulmonary abnormality. Electronically Signed   By: Meda Klinefelter MD   On: 03/13/2021 14:51    Scheduled Meds: . enoxaparin (LOVENOX) injection  40 mg Subcutaneous Q24H  . insulin aspart  0-9 Units Subcutaneous Q6H  . pantoprazole (PROTONIX) IV  40 mg Intravenous Q12H   Continuous Infusions: . sodium chloride 75 mL/hr at 03/14/21 0829     LOS: 1 day   Time spent: 35 mins   Kristopher Attwood Laural Benes, MD How to contact the [facility] Attending or Consulting provider 7A - 7P or covering provider during after hours 7P -7A, for this patient?  1. Check the care team in [facility] and look for a) attending/consulting TRH provider listed and b) the [facility] team listed 2. Log into [email or web address] and use [facility]'s universal password to access. If you do not have the password, please contact the hospital operator. 3. Locate the [facility] provider you are looking for under [facility] and page to a number that you can be directly reached. 4. If you still have difficulty reaching the provider, please page the [facility] (Director on Call) for the Hospitalists listed on amion for assistance.  03/14/2021, 12:40 PM

## 2021-03-15 ENCOUNTER — Inpatient Hospital Stay (HOSPITAL_COMMUNITY): Payer: Medicare Other | Admitting: Anesthesiology

## 2021-03-15 ENCOUNTER — Encounter (HOSPITAL_COMMUNITY): Payer: Self-pay | Admitting: Family Medicine

## 2021-03-15 ENCOUNTER — Encounter (HOSPITAL_COMMUNITY)
Admission: EM | Disposition: A | Discharge status: Skilled Nursing Facility | Payer: Self-pay | Source: Home / Self Care | Attending: Family Medicine

## 2021-03-15 DIAGNOSIS — K432 Incisional hernia without obstruction or gangrene: Secondary | ICD-10-CM

## 2021-03-15 DIAGNOSIS — Q433 Congenital malformations of intestinal fixation: Secondary | ICD-10-CM

## 2021-03-15 DIAGNOSIS — K430 Incisional hernia with obstruction, without gangrene: Principal | ICD-10-CM

## 2021-03-15 HISTORY — PX: LYSIS OF ADHESION: SHX5961

## 2021-03-15 HISTORY — PX: LAPAROTOMY: SHX154

## 2021-03-15 HISTORY — PX: INCISIONAL HERNIA REPAIR: SHX193

## 2021-03-15 LAB — COMPREHENSIVE METABOLIC PANEL
ALT: 29 U/L (ref 0–44)
AST: 26 U/L (ref 15–41)
Albumin: 3.5 g/dL (ref 3.5–5.0)
Alkaline Phosphatase: 49 U/L (ref 38–126)
Anion gap: 6 (ref 5–15)
BUN: 8 mg/dL (ref 8–23)
CO2: 29 mmol/L (ref 22–32)
Calcium: 8.3 mg/dL — ABNORMAL LOW (ref 8.9–10.3)
Chloride: 100 mmol/L (ref 98–111)
Creatinine, Ser: 0.78 mg/dL (ref 0.44–1.00)
GFR, Estimated: >60 mL/min (ref >60)
Glucose, Bld: 184 mg/dL — ABNORMAL HIGH (ref 70–99)
Potassium: 3.6 mmol/L (ref 3.5–5.1)
Sodium: 135 mmol/L (ref 135–145)
Total Bilirubin: 0.5 mg/dL (ref 0.3–1.2)
Total Protein: 6.5 g/dL (ref 6.5–8.1)

## 2021-03-15 LAB — GLUCOSE, CAPILLARY
Glucose-Capillary: 179 mg/dL — ABNORMAL HIGH (ref 70–99)
Glucose-Capillary: 194 mg/dL — ABNORMAL HIGH (ref 70–99)
Glucose-Capillary: 166 mg/dL — ABNORMAL HIGH (ref 70–99)
Glucose-Capillary: 165 mg/dL — ABNORMAL HIGH (ref 70–99)
Glucose-Capillary: 216 mg/dL — ABNORMAL HIGH (ref 70–99)
Glucose-Capillary: 252 mg/dL — ABNORMAL HIGH (ref 70–99)
Glucose-Capillary: 182 mg/dL — ABNORMAL HIGH (ref 70–99)

## 2021-03-15 LAB — SURGICAL PCR SCREEN
MRSA, PCR: NEGATIVE
Staphylococcus aureus: NEGATIVE

## 2021-03-15 SURGERY — LAPAROTOMY, EXPLORATORY
Anesthesia: General | Site: Abdomen

## 2021-03-15 MED ORDER — FENTANYL CITRATE (PF) 100 MCG/2ML IJ SOLN
INTRAMUSCULAR | Status: DC | PRN
  Administered 2021-03-15 (×3): 50 ug via INTRAVENOUS
  Administered 2021-03-15: 100 ug via INTRAVENOUS
  Administered 2021-03-15 (×2): 50 ug via INTRAVENOUS

## 2021-03-15 MED ORDER — KETAMINE HCL 10 MG/ML IJ SOLN
INTRAMUSCULAR | Status: DC | PRN
  Administered 2021-03-15: 30 mg via INTRAVENOUS
  Administered 2021-03-15: 20 mg via INTRAVENOUS

## 2021-03-15 MED ORDER — ORAL CARE MOUTH RINSE: 15.0000 mL | Freq: Once | OROMUCOSAL | Status: DC

## 2021-03-15 MED ORDER — ROCURONIUM BROMIDE 10 MG/ML (PF) SYRINGE
PREFILLED_SYRINGE | INTRAVENOUS | Status: AC
  Filled 2021-03-15: qty 10

## 2021-03-15 MED ORDER — LACTATED RINGERS IV SOLN: INTRAVENOUS | Status: DC

## 2021-03-15 MED ORDER — FENTANYL CITRATE (PF) 100 MCG/2ML IJ SOLN: 25.0000 ug | INTRAMUSCULAR | Status: DC | PRN

## 2021-03-15 MED ORDER — CHLORHEXIDINE GLUCONATE CLOTH 2 % EX PADS
6.0000 | MEDICATED_PAD | Freq: Every day | CUTANEOUS | Status: DC
  Administered 2021-03-15 – 2021-03-21 (×6): 6 via TOPICAL

## 2021-03-15 MED ORDER — SODIUM CHLORIDE 0.9 % IV SOLN
INTRAVENOUS | Status: DC | PRN
  Administered 2021-03-15 (×2): 80 ug via INTRAVENOUS

## 2021-03-15 MED ORDER — KETOROLAC TROMETHAMINE 30 MG/ML IJ SOLN
INTRAMUSCULAR | Status: AC
  Filled 2021-03-15: qty 1

## 2021-03-15 MED ORDER — PROPOFOL 10 MG/ML IV BOLUS
INTRAVENOUS | Status: DC | PRN
  Administered 2021-03-15: 80 mg via INTRAVENOUS

## 2021-03-15 MED ORDER — ONDANSETRON HCL 4 MG/2ML IJ SOLN
INTRAMUSCULAR | Status: DC | PRN
  Administered 2021-03-15: 4 mg via INTRAVENOUS

## 2021-03-15 MED ORDER — ROCURONIUM BROMIDE 10 MG/ML (PF) SYRINGE
PREFILLED_SYRINGE | INTRAVENOUS | Status: DC | PRN
  Administered 2021-03-15: 20 mg via INTRAVENOUS
  Administered 2021-03-15: 10 mg via INTRAVENOUS
  Administered 2021-03-15: 60 mg via INTRAVENOUS
  Administered 2021-03-15: 20 mg via INTRAVENOUS

## 2021-03-15 MED ORDER — SODIUM CHLORIDE 0.9 % IR SOLN
Status: DC | PRN
  Administered 2021-03-15 (×2): 1000 mL

## 2021-03-15 MED ORDER — SUGAMMADEX SODIUM 200 MG/2ML IV SOLN
INTRAVENOUS | Status: DC | PRN
  Administered 2021-03-15: 300 mg via INTRAVENOUS

## 2021-03-15 MED ORDER — SUCCINYLCHOLINE CHLORIDE 20 MG/ML IJ SOLN
INTRAMUSCULAR | Status: DC | PRN
  Administered 2021-03-15: 120 mg via INTRAVENOUS

## 2021-03-15 MED ORDER — CHLORHEXIDINE GLUCONATE 0.12 % MT SOLN: 15.0000 mL | Freq: Once | OROMUCOSAL | Status: DC

## 2021-03-15 MED ORDER — FENTANYL CITRATE (PF) 250 MCG/5ML IJ SOLN
INTRAMUSCULAR | Status: AC
  Filled 2021-03-15: qty 5

## 2021-03-15 MED ORDER — MIDAZOLAM HCL 5 MG/5ML IJ SOLN
INTRAMUSCULAR | Status: DC | PRN
  Administered 2021-03-15: 2 mg via INTRAVENOUS

## 2021-03-15 MED ORDER — BUPIVACAINE LIPOSOME 1.3 % IJ SUSP
INTRAMUSCULAR | Status: AC
  Filled 2021-03-15: qty 20

## 2021-03-15 MED ORDER — BUPIVACAINE LIPOSOME 1.3 % IJ SUSP
INTRAMUSCULAR | Status: DC | PRN
  Administered 2021-03-15: 20 mL

## 2021-03-15 MED ORDER — DEXAMETHASONE SODIUM PHOSPHATE 10 MG/ML IJ SOLN
INTRAMUSCULAR | Status: AC
  Filled 2021-03-15: qty 1

## 2021-03-15 MED ORDER — PROPOFOL 10 MG/ML IV BOLUS
INTRAVENOUS | Status: AC
  Filled 2021-03-15: qty 20

## 2021-03-15 MED ORDER — PHENYLEPHRINE 40 MCG/ML (10ML) SYRINGE FOR IV PUSH (FOR BLOOD PRESSURE SUPPORT)
PREFILLED_SYRINGE | INTRAVENOUS | Status: AC
  Filled 2021-03-15: qty 10

## 2021-03-15 MED ORDER — KETOROLAC TROMETHAMINE 30 MG/ML IJ SOLN
15.0000 mg | Freq: Four times a day (QID) | INTRAMUSCULAR | Status: AC
  Administered 2021-03-15 – 2021-03-20 (×18): 15 mg via INTRAVENOUS
  Filled 2021-03-15 (×17): qty 1

## 2021-03-15 MED ORDER — ONDANSETRON HCL 4 MG/2ML IJ SOLN
INTRAMUSCULAR | Status: AC
  Filled 2021-03-15: qty 2

## 2021-03-15 MED ORDER — ONDANSETRON HCL 4 MG/2ML IJ SOLN: 4.0000 mg | Freq: Once | INTRAMUSCULAR | Status: DC | PRN

## 2021-03-15 MED ORDER — MIDAZOLAM HCL 2 MG/2ML IJ SOLN
INTRAMUSCULAR | Status: AC
  Filled 2021-03-15: qty 2

## 2021-03-15 MED ORDER — FENTANYL CITRATE (PF) 100 MCG/2ML IJ SOLN
INTRAMUSCULAR | Status: AC
  Filled 2021-03-15: qty 2

## 2021-03-15 MED ORDER — LIDOCAINE HCL (PF) 2 % IJ SOLN
INTRAMUSCULAR | Status: AC
  Filled 2021-03-15: qty 5

## 2021-03-15 MED ORDER — HEMOSTATIC AGENTS (NO CHARGE) OPTIME
TOPICAL | Status: DC | PRN
  Administered 2021-03-15: 1 via TOPICAL

## 2021-03-15 MED ORDER — DEXAMETHASONE SODIUM PHOSPHATE 4 MG/ML IJ SOLN
INTRAMUSCULAR | Status: DC | PRN
  Administered 2021-03-15: 4 mg via INTRAVENOUS

## 2021-03-15 MED ORDER — LIDOCAINE HCL (CARDIAC) PF 100 MG/5ML IV SOSY
PREFILLED_SYRINGE | INTRAVENOUS | Status: DC | PRN
  Administered 2021-03-15: 100 mg via INTRAVENOUS

## 2021-03-15 MED ORDER — KETAMINE HCL 50 MG/5ML IJ SOSY
PREFILLED_SYRINGE | INTRAMUSCULAR | Status: AC
  Filled 2021-03-15: qty 5

## 2021-03-15 MED ORDER — SUCCINYLCHOLINE CHLORIDE 200 MG/10ML IV SOSY
PREFILLED_SYRINGE | INTRAVENOUS | Status: AC
  Filled 2021-03-15: qty 10

## 2021-03-15 MED ORDER — MORPHINE SULFATE (PF) 2 MG/ML IV SOLN
2.0000 mg | INTRAVENOUS | Status: DC | PRN
  Administered 2021-03-16: 2 mg via INTRAVENOUS
  Filled 2021-03-15 (×2): qty 1

## 2021-03-15 MED ORDER — ENOXAPARIN SODIUM 40 MG/0.4ML IJ SOSY
40.0000 mg | PREFILLED_SYRINGE | INTRAMUSCULAR | Status: DC
  Administered 2021-03-16 – 2021-03-21 (×6): 40 mg via SUBCUTANEOUS
  Filled 2021-03-15 (×5): qty 0.4

## 2021-03-15 SURGICAL SUPPLY — 48 items
ADH SKN CLS APL DERMABOND .7 (GAUZE/BANDAGES/DRESSINGS) ×1
APL PRP STRL LF DISP 70% ISPRP (MISCELLANEOUS) ×1
BLADE 11 SAFETY STRL DISP (BLADE) ×1 IMPLANT
CHLORAPREP W/TINT 26 (MISCELLANEOUS) ×2 IMPLANT
CLOTH BEACON ORANGE TIMEOUT ST (SAFETY) ×2 IMPLANT
COVER LIGHT HANDLE STERIS (MISCELLANEOUS) ×4 IMPLANT
COVER WAND RF STERILE (DRAPES) ×2 IMPLANT
DERMABOND ADVANCED (GAUZE/BANDAGES/DRESSINGS) ×1
DERMABOND ADVANCED .7 DNX12 (GAUZE/BANDAGES/DRESSINGS) IMPLANT
DRAPE WARM FLUID 44X44 (DRAPES) ×2 IMPLANT
DRSG OPSITE POSTOP 4X10 (GAUZE/BANDAGES/DRESSINGS) ×1 IMPLANT
ELECT BLADE 6 FLAT ULTRCLN (ELECTRODE) ×1 IMPLANT
ELECT REM PT RETURN 9FT ADLT (ELECTROSURGICAL) ×2
ELECTRODE REM PT RTRN 9FT ADLT (ELECTROSURGICAL) ×1 IMPLANT
GLOVE SURG ENC MOIS LTX SZ6.5 (GLOVE) ×2 IMPLANT
GLOVE SURG SS PI 7.5 STRL IVOR (GLOVE) ×2 IMPLANT
GLOVE SURG UNDER POLY LF SZ6.5 (GLOVE) ×2 IMPLANT
GLOVE SURG UNDER POLY LF SZ7 (GLOVE) ×4 IMPLANT
GOWN STRL REUS W/TWL LRG LVL3 (GOWN DISPOSABLE) ×7 IMPLANT
GRASPER SUT TROCAR 14GX15 (MISCELLANEOUS) ×1 IMPLANT
HANDLE SUCTION POOLE (INSTRUMENTS) ×1 IMPLANT
HEMOSTAT SNOW SURGICEL 2X4 (HEMOSTASIS) ×1 IMPLANT
INST SET MAJOR GENERAL (KITS) ×2 IMPLANT
KIT TURNOVER KIT A (KITS) ×2 IMPLANT
MANIFOLD NEPTUNE II (INSTRUMENTS) ×2 IMPLANT
MESH VENTRALIGHT ST 8X10 (Mesh General) ×1 IMPLANT
NDL HYPO 18GX1.5 BLUNT FILL (NEEDLE) ×1 IMPLANT
NDL HYPO 21X1.5 SAFETY (NEEDLE) ×1 IMPLANT
NEEDLE HYPO 18GX1.5 BLUNT FILL (NEEDLE) ×2 IMPLANT
NEEDLE HYPO 21X1.5 SAFETY (NEEDLE) ×2 IMPLANT
NS IRRIG 1000ML POUR BTL (IV SOLUTION) ×4 IMPLANT
PACK MAJOR ABDOMINAL (CUSTOM PROCEDURE TRAY) ×2 IMPLANT
PAD ARMBOARD 7.5X6 YLW CONV (MISCELLANEOUS) ×3 IMPLANT
PENCIL SMOKE EVACUATOR COATED (MISCELLANEOUS) ×2 IMPLANT
RETRACTOR WND ALEXIS-O 25 LRG (MISCELLANEOUS) IMPLANT
RTRCTR WOUND ALEXIS O 25CM LRG (MISCELLANEOUS) ×2
SET BASIN LINEN APH (SET/KITS/TRAYS/PACK) ×2 IMPLANT
SPONGE LAP 18X18 RF (DISPOSABLE) ×2 IMPLANT
STAPLER GUN LINEAR PROX 60 (STAPLE) IMPLANT
STAPLER VISISTAT (STAPLE) ×2 IMPLANT
SUCTION POOLE HANDLE (INSTRUMENTS) ×2
SUT PDS AB 0 CTX 60 (SUTURE) ×2 IMPLANT
SUT PDS AB CT VIOLET #0 27IN (SUTURE) ×4 IMPLANT
SUT PROLENE 0 CT 1 30 (SUTURE) ×2 IMPLANT
SUT PROLENE 2 0 CR (SUTURE) ×2 IMPLANT
SUT SILK 3 0 SH CR/8 (SUTURE) ×2 IMPLANT
SYR 20ML LL LF (SYRINGE) ×4 IMPLANT
TRAY FOLEY MTR SLVR 16FR STAT (SET/KITS/TRAYS/PACK) ×2 IMPLANT

## 2021-03-15 NOTE — Progress Notes (Signed)
PROGRESS NOTE   [name]  IRC:[id number] DOB: 1951/06/26 DOA: 03/12/2021 PCP: Mirna Mires, MD   Chief Complaint  Patient presents with  . Chest Pain   Level of care: Med-Surg  Brief Admission History:   70 y.o. female with medical history significant for diabetes mellitus, hypertension, asthma.  Patient presented to the ED with complaints of abdominal pain, chest pain, vomiting and diarrhea.  She reports midsternal chest pain has been ongoing since discharge from the hospital, she was recently admitted for same, cardiology was consulted, symptoms were thought not consistent with cardiac etiology, her calculated coronary calcium score was 0 by coronary CT 05/2019, GI evaluation was recommended.  She describes burning midsternal chest pain worse when she vomits.  She reports onset of vomiting today, she has had 6 episodes of vomiting today, no blood.  She also reports 6 episodes of bloody loose stools today.  She reports associated abdominal pain right lower abdomen, and bloating and intermittent burning pain with urination.  Assessment & Plan:   Principal Problem:   Intractable abdominal pain Active Problems:   Diabetes (HCC)   Essential hypertension   Chest pain at rest   Incisional hernia, without obstruction or gangrene   Malrotation of intestine   Abdominal pain   Non-intractable vomiting   Abdominal pain - on 03/15/21 patient underwent  Exploratory laparotomy, lysis of adhesions/reduction of small bowel to the right side of the abdomen and colon to the left side of abdomen (Ladd's procedure), incisional hernia repair with mesh (Ventralight ST Mesh 20.3X 25.4cm)  --Now Awaiting return of bowel function and tolerance of oral intake   Atypical chest pain -her symptoms are suggesting uncontrolled gastritis, continue Protonix and appreciate GI consultation  Type 2 diabetes mellitus with neurological complications - continue SSI coverage and frequent CBG  Monitoring.     Leukocytosis - reactive, resolved now with normal WBC.   FEN--- continue IV fluids while  Awaiting return of bowel function and tolerance of oral intake  DVT prophylaxis: enoxaparin  Code Status: full  Family Communication: pt Disposition: home  Status is: Inpatient  Remains inpatient appropriate because:--Now Awaiting return of bowel function and tolerance of oral intake   Dispo: The patient is from: Home              Anticipated d/c is to: Home              Patient currently is not medically stable to d/c.   Difficult to place patient No   Consultants:   GI   Surgery   Procedures:   NG placement  -Exploratory laparotomy on 03/15/2021 Antimicrobials:  Ceftriaxone stopped 5/30 metronidazole stopped 5/30  Subjective: -Status post exploratory laparotomy,-resting  Objective: Vitals:   03/15/21 1445 03/15/21 1500 03/15/21 1515 03/15/21 1535  BP: 126/61 (!) 112/59 127/69 122/67  Pulse: 88 88 92 96  Resp: 15 17 17 17   Temp:   98.3 F (36.8 C) 99 F (37.2 C)  TempSrc:    Oral  SpO2: 92% 91% 91% (!) 87%  Weight:      Height:        Intake/Output Summary (Last 24 hours) at 03/15/2021 1829 Last data filed at 03/15/2021 1753 Gross per 24 hour  Intake 2900 ml  Output 1000 ml  Net 1900 ml   Filed Weights   03/12/21 1124  Weight: 85.7 kg    Examination:  General exam: chronically ill appearing, In no apparent distress  Respiratory system: Clear to auscultation. Respiratory effort  normal. Cardiovascular system: normal S1 & S2 heard. No JVD, murmurs, rubs,  Gastrointestinal system: Diminished bowel sounds, appropriate postop tenderness Central nervous system: Alert and oriented. No focal neurological deficits. Extremities: Symmetric 5 x 5 power. Skin: No rashes, lesions or ulcers.   Psychiatry: Judgement and insight appear normal. Mood & affect appropriate.   Data Reviewed: I have personally reviewed following labs and imaging studies  CBC: Recent Labs  Lab  03/12/21 1121 03/13/21 0611 03/14/21 0623  WBC 21.7* 10.7* 7.4  NEUTROABS 17.7*  --  3.8  HGB 14.7 12.5 12.0  HCT 46.3* 40.0 38.7  MCV 93.2 95.0 97.0  PLT 257 231 211    Basic Metabolic Panel: Recent Labs  Lab 03/12/21 1121 03/13/21 0611 03/14/21 0623 03/15/21 0524  NA 134* 136 138 135  K 3.7 4.0 3.7 3.6  CL 97* 101 103 100  CO2 27 28 28 29   GLUCOSE 178* 195* 150* 184*  BUN 12 15 10 8   CREATININE 0.81 0.86 0.75 0.78  CALCIUM 9.4 8.2* 8.0* 8.3*  MG  --   --  1.5*  --     GFR: Estimated Creatinine Clearance: 70.8 mL/min (by C-G formula based on SCr of 0.78 mg/dL).  Liver Function Tests: Recent Labs  Lab 03/12/21 1121 03/14/21 0623 03/15/21 0524  AST 24 33 26  ALT 20 30 29   ALKPHOS 66 46 49  BILITOT 0.6 0.8 0.5  PROT 7.8 5.8* 6.5  ALBUMIN 4.3 3.1* 3.5    CBG: Recent Labs  Lab 03/14/21 2133 03/15/21 0555 03/15/21 0846 03/15/21 1343 03/15/21 1606  GLUCAP 178* 194* 165* 216* 252*    Recent Results (from the past 240 hour(s))  Culture, Urine     Status: Abnormal   Collection Time: 03/12/21  3:21 PM   Specimen: Urine, Clean Catch  Result Value Ref Range Status   Specimen Description   Final    URINE, CLEAN CATCH Performed at [facility], [address], [place], [facility] [address]    Special Requests   Final    NONE Performed at [facility], [address], [place], [facility] [address]    Culture MULTIPLE SPECIES PRESENT, SUGGEST RECOLLECTION (A)  Final   Report Status 03/14/2021 FINAL  Final  Resp Panel by RT-PCR (Flu A&B, Covid) Nasopharyngeal Swab     Status: None   Collection Time: 03/12/21  3:56 PM   Specimen: Nasopharyngeal Swab; Nasopharyngeal(NP) swabs in vial transport medium  Result Value Ref Range Status   SARS Coronavirus 2 by RT PCR NEGATIVE NEGATIVE Final    Comment: (NOTE) SARS-CoV-2 target nucleic acids are NOT DETECTED.  The SARS-CoV-2 RNA is generally detectable in upper respiratory specimens during the acute phase of infection.  The lowest concentration of SARS-CoV-2 viral copies this assay can detect is 138 copies/mL. A negative result does not preclude SARS-Cov-2 infection and should not be used as the sole basis for treatment or other patient management decisions. A negative result may occur with  improper specimen collection/handling, submission of specimen other than nasopharyngeal swab, presence of viral mutation(s) within the areas targeted by this assay, and inadequate number of viral copies(<138 copies/mL). A negative result must be combined with clinical observations, patient history, and epidemiological information. The expected result is Negative.  Fact Sheet for Patients:  94503  Fact Sheet for Healthcare Providers:  03/16/2021  This test is no t yet approved or cleared by the 03/14/21 FDA and  has been authorized for detection and/or diagnosis of SARS-CoV-2 by  FDA under an Emergency Use Authorization (EUA). This EUA will remain  in effect (meaning this test can be used) for the duration of the COVID-19 declaration under Section 564(b)(1) of the Act, 21 U.S.C.section 360bbb-3(b)(1), unless the authorization is terminated  or revoked sooner.       Influenza A by PCR NEGATIVE NEGATIVE Final   Influenza B by PCR NEGATIVE NEGATIVE Final    Comment: (NOTE) The Xpert Xpress SARS-CoV-2/FLU/RSV plus assay is intended as an aid in the diagnosis of influenza from Nasopharyngeal swab specimens and should not be used as a sole basis for treatment. Nasal washings and aspirates are unacceptable for Xpert Xpress SARS-CoV-2/FLU/RSV testing.  Fact Sheet for Patients: BloggerCourse.com  Fact Sheet for Healthcare Providers: SeriousBroker.it  This test is not yet approved or cleared by the [place] FDA and has been authorized for detection and/or diagnosis of SARS-CoV-2 by FDA  under an Emergency Use Authorization (EUA). This EUA will remain in effect (meaning this test can be used) for the duration of the COVID-19 declaration under Section 564(b)(1) of the Act, 21 U.S.C. section 360bbb-3(b)(1), unless the authorization is terminated or revoked.  Performed at [facility], [address] [place], [place] [address]   Surgical pcr screen     Status: None   Collection Time: 03/15/21 12:41 AM   Specimen: Nasal Mucosa; Nasal Swab  Result Value Ref Range Status   MRSA, PCR NEGATIVE NEGATIVE Final   Staphylococcus aureus NEGATIVE NEGATIVE Final    Comment: (NOTE) The Xpert SA Assay (FDA approved for NASAL specimens in patients 75 years of age and older), is one component of a comprehensive surveillance program. It is not intended to diagnose infection nor to guide or monitor treatment. Performed at [facility], [address], [place], [place] [address]      Radiology Studies: No results found.  Scheduled Meds: . Chlorhexidine Gluconate Cloth  6 each Topical Daily  . [START ON 03/16/2021] enoxaparin (LOVENOX) injection  40 mg Subcutaneous Q24H  . insulin aspart  0-9 Units Subcutaneous Q6H  . ketorolac  15 mg Intravenous Q6H  . pantoprazole (PROTONIX) IV  40 mg Intravenous Q12H   Continuous Infusions: . sodium chloride 75 mL/hr at 03/15/21 1542     LOS: 2 days   Shon Hale, MD How to contact the [facility] Attending or Consulting provider 7A - 7P or covering provider during after hours 7P -7A, for this patient?  1. Check the care team in [facility] and look for a) attending/consulting TRH provider listed and b) the [facility] team listed 2. Log into [email or web address] and use [facility]'s universal password to access. If you do not have the password, please contact the hospital operator. 3. Locate the [facility] provider you are looking for under [facility] and page to a number that you can be directly reached. 4. If you still have difficulty reaching the provider, please  page the [facility] (Director on Call) for the Hospitalists listed on amion for assistance.  03/15/2021, 6:29 PM

## 2021-03-15 NOTE — Anesthesia Procedure Notes (Signed)
Procedure Name: Intubation Performed by: Brynda Peon, CRNA Pre-anesthesia Checklist: Patient identified, Suction available, Patient being monitored, Timeout performed and Emergency Drugs available Patient Re-evaluated:Patient Re-evaluated prior to induction Oxygen Delivery Method: Circle system utilized Preoxygenation: Pre-oxygenation with 100% oxygen Induction Type: IV induction Laryngoscope Size: Miller and 2 Grade View: Grade I Tube type: Oral Tube size: 7.0 mm Number of attempts: 1 Airway Equipment and Method: Stylet Placement Confirmation: ETT inserted through vocal cords under direct vision,  positive ETCO2,  CO2 detector and breath sounds checked- equal and bilateral Secured at: 21 cm Tube secured with: Tape Dental Injury: Teeth and Oropharynx as per pre-operative assessment

## 2021-03-15 NOTE — Progress Notes (Signed)
[facility]  Updated family.  Lysis of adhesions, and incisional hernia repair with mesh.  Ng given lysis of adhesions. No bowel resection needed. Foley overnight. Some minor bleeding from inferior edge wound, reinforced with dressing, binder in place.  [name], MD [facility] [address] [place], [place] [address] [phone] (office)

## 2021-03-15 NOTE — Anesthesia Preprocedure Evaluation (Signed)
Anesthesia Evaluation  Patient identified by MRN, date of birth, ID band Patient awake    Reviewed: Allergy & Precautions, H&P , NPO status , Patient's Chart, lab work & pertinent test results, reviewed documented beta blocker date and time   Airway Mallampati: II  TM Distance: >3 FB Neck ROM: full    Dental no notable dental hx.    Pulmonary asthma , COPD,    Pulmonary exam normal breath sounds clear to auscultation       Cardiovascular Exercise Tolerance: Good hypertension, negative cardio ROS   Rhythm:regular Rate:Normal     Neuro/Psych  Headaches,  Neuromuscular disease negative psych ROS   GI/Hepatic Neg liver ROS, GERD  Medicated,  Endo/Other  negative endocrine ROSdiabetes  Renal/GU negative Renal ROS  negative genitourinary   Musculoskeletal   Abdominal   Peds  Hematology  (+) Blood dyscrasia, anemia ,   Anesthesia Other Findings Left ventricular ejection fraction, by estimation, is 60 to 65%. The left ventricle has normal function. The left ventricle has no regional wall motion abnormalities. There is mild left ventricular hypertrophy. Left ventricular diastolic parameters are consistent with Grade I diastolic dysfunction (impaired relaxation). 2. Right ventricular systolic function is normal. The right ventricular size is normal. 3. Left atrial size was mildly dilated. 4. The mitral valve is normal in structure. Trivial mitral valve regurgitation. No evidence of mitral stenosis. 5. The aortic valve is tricuspid. Aortic valve regurgitation is not visualized. No aortic stenosis is present. 6. The inferior vena cava is normal in size with greater than 50% respiratory variability, suggesting right atrial pressure of 3 mmHg.  Reproductive/Obstetrics negative OB ROS                             Anesthesia Physical Anesthesia Plan  ASA: III  Anesthesia Plan: General   Post-op  Pain Management:    Induction:   PONV Risk Score and Plan: Ondansetron  Airway Management Planned:   Additional Equipment:   Intra-op Plan:   Post-operative Plan:   Informed Consent: I have reviewed the patients History and Physical, chart, labs and discussed the procedure including the risks, benefits and alternatives for the proposed anesthesia with the patient or authorized representative who has indicated his/her understanding and acceptance.     Dental Advisory Given  Plan Discussed with: CRNA  Anesthesia Plan Comments:         Anesthesia Quick Evaluation

## 2021-03-15 NOTE — Transfer of Care (Signed)
Immediate Anesthesia Transfer of Care Note  Patient: [name]  Procedure(s) Performed: EXPLORATORY LAPAROTOMY; lysis of adhesions, Incisional Herniorrhaphy with Mesh (N/A Abdomen)  Patient Location: PACU  Anesthesia Type:General  Level of Consciousness: drowsy, patient cooperative and responds to stimulation  Airway & Oxygen Therapy: Patient Spontanous Breathing and Patient connected to face mask oxygen  Post-op Assessment: Report given to RN, Post -op Vital signs reviewed and stable and Patient moving all extremities  Post vital signs: Reviewed and stable  Last Vitals:  Vitals Value Taken Time  BP 134/82 03/15/21 1320  Temp    Pulse 85 03/15/21 1325  Resp 12 03/15/21 1325  SpO2 97 % 03/15/21 1325  Vitals shown include unvalidated device data.  Last Pain:  Vitals:   03/15/21 0842  TempSrc: Oral  PainSc: 0-No pain         Complications: No complications documented.

## 2021-03-15 NOTE — Op Note (Signed)
[facility] Operative Note  03/15/21  Preoperative Diagnosis:  Malrotation, incisional hernia    Postoperative Diagnosis: Malrotation, incarcerated incisional hernia with colon, prior appendectomy possible Ladd's procedure    Procedure(s) Performed: Exploratory laparotomy, lysis of adhesions/reduction of small bowel to the right side of the abdomen and colon to the left side of abdomen (Ladd's procedure), incisional hernia repair with mesh (Ventralight ST Mesh 20.3X 25.4cm)    Surgeon: Leatrice Jewels. Henreitta Leber, MD   Assistants: Franky Macho, MD    Anesthesia: General endotracheal   Anesthesiologist: Dr. Johnnette Litter    Specimens: None    Estimated Blood Loss: Minimal   Blood Replacement: None    Complications: None   Wound Class: Clean    Operative Indications: [name] is a 70 yo with a incisional hernia with multiple defects in her abdominal wall and incidentally found malrotation. She was unsure of her prior surgeries but had midline incision. She came in with abdominal pain and known hernia and also issues with chronic pain, reflux and constipation. Given that this symptoms could be related to the malrotation and potentially the hernia to we discussed a Ladd's procedure to correct her malrotation and prevent future volvulus and ventral hernia repair with possible mesh. We discussed the potential need for appendectomy and bowel resection and risk of bleeding, infection, inability to use mesh to fix hernia.   Findings: Prior appendectomy and hernia repair with mesh, multiple ventral hernias in the abdominal wall midline and on outer edges of mesh    Procedure: The patient was taken to the operating room and placed supine. General endotracheal anesthesia was induced. Intravenous antibiotics were administered per protocol.  An orogastric tube positioned to decompress the stomach. A foley catheter was placed. The abdomen was prepared and draped in the usual sterile fashion.    A midline incision was made and we entered the fascia in the superior portion away from her prior scar with care.  Using scissors we were able to get into the peritoneal cavity and take down omental adhesions with cautery.  There were loops of bowel going into multiple of the ventral wall defects and these were reduced and the adhesions were taken down with scissors. In the inferior portion of the midline there was a hardened spot from prior mesh repair and recurrence hernia with colon forming Richter hernia in the defect. This was carefully taken down with scissors. The hardened piece of mesh and fascia was excised.  Once the anterior abdominal wall was cleared there were multiple ventral wall defects on both sides and in the midline ranging from 1-2cm in size.  There was a midline mesh that was incorporated to a portion of the midline.  A wound protector was placed and we proceeded to eviscerate the bowel. The colon mostly in the left side of the abdomen and the cecum did not have bands to the right upper quadrant and a prior appendectomy had been done which likely indicates she had a ladd's type procedure when she had her prior hernia repair.  She did however have several loops of small intestine with adhesions that were compressing the duodenum and some twisting of the small intestine in the upper abdomen.  The small bowel adhesions were taken down carefully with scissors and the small bowel was ran from the duodenum back to the terminal ileum.  The duodenum was straightened out and the mesentery was scored to widen the base.  The small bowel was ran again and injuries were noted. Some  minor bleeding at the root of the mesentery was noted and Surgical SNOW was placed.    The small bowel was placed in the right side of the abdomen and the colon was placed in the left side of the abdomen. There was a possible serosal tear on the cecum that was oversewn with 3-0 silk Lembert sutures.  An NG was placed given  the manipulation of the bowel.    From here given that no resection was done and no enterotomies made, a Ventralight ST 20.3X25.4cm elliptical mesh was used to cover the multiple swiss cheese ventral wall defects.  The midline fascia was pulled together and the approximate points for the edges of the mesh were determined on the abdominal wall.  2-0 Prolene sutures were used to tack the mesh to the anterior abdominal wall through stab incisions made with a suture passer under direct visualization into the abdomen.  Once these 4 points were fixed to the anterior abdominal wall additional 2-0 Prolene sutures were used to adhere the mesh to the anterior abdominal wall. The slick coated side of the mesh faced toward the bowel.   The fascial defects were covered. The midline was closed with a running 0 looped PDS suture re-approximating the midline fascia and the prior mesh.  Hemostasis was achieved but the patient was overall oozing in nature but there were not obvious sources of bleeding.  Exparel was injected. The skin iof the midline was closed with staples. The stab incision for the suture passer were closed with dermabond. A honeycomb dressing was plaed.   Dr. Lovell Sheehan was assisting throughout the procedure and was present for the critical portions of the case.   Final inspection revealed acceptable hemostasis. All counts were correct at the end of the case. The patient was awakened from anesthesia and extubated without complication.  The patient went to the PACU in stable condition.   [name], MD [facility] [address] [place], [place] [address] [phone] (office)

## 2021-03-15 NOTE — Anesthesia Postprocedure Evaluation (Signed)
Anesthesia Post Note  Patient: AUDRIELLE VANKUREN  Procedure(s) Performed: EXPLORATORY LAPAROTOMY (N/A Abdomen) LYSIS OF ADHESION (N/A Abdomen) INCISIONAL HERNIORRHAPHY WITH MESH (N/A Abdomen)  Patient location during evaluation: Phase II Anesthesia Type: General Level of consciousness: awake Pain management: pain level controlled Vital Signs Assessment: post-procedure vital signs reviewed and stable Respiratory status: spontaneous breathing and respiratory function stable Cardiovascular status: blood pressure returned to baseline and stable Postop Assessment: no headache and no apparent nausea or vomiting Anesthetic complications: no Comments: Late entry   No complications documented.   Last Vitals:  Vitals:   03/15/21 1515 03/15/21 1535  BP: 127/69 122/67  Pulse: 92 96  Resp: 17 17  Temp: 36.8 C 37.2 C  SpO2: 91% (!) 87%    Last Pain:  Vitals:   03/15/21 1535  TempSrc: Oral  PainSc:                  Windell Norfolk

## 2021-03-15 NOTE — Interval H&P Note (Signed)
History and Physical Interval Note:  03/15/2021 10:26 AM  [name]  has presented today for surgery, with the diagnosis of malrotation, ventral hernia.  The various methods of treatment have been discussed with the patient and family. After consideration of risks, benefits and other options for treatment, the patient has consented to  Procedure(s): EXPLORATORY LAPAROTOMY; lysis of adhesions, ladds procedure, repair hernia, possible mesh (N/A) as a surgical intervention.  The patient's history has been reviewed, patient examined, no change in status, stable for surgery.  I have reviewed the patient's chart and labs.  Questions were answered to the patient's satisfaction.    Plan for hernia repair and ladd's procedure. Lucretia Roers

## 2021-03-15 NOTE — Progress Notes (Signed)
Lower part of honeycomb dressing scant red drainage, (1415) marked. 15 minutes checked and drainage is oozing under occlusive dressing from bottom area of dressing, Dr. Henreitta Leber notified and came to bedside to assess. Bottom of dressing reinforced with 2 abdominal pads and taped tightly with micro foam tape and abdominal binder reapplied. Patient's linens and gown changed. 1507 Dr. Henreitta Leber back to assess operative site no further oozing. Dressing dry and intact.

## 2021-03-16 ENCOUNTER — Encounter (HOSPITAL_COMMUNITY): Payer: Self-pay | Admitting: General Surgery

## 2021-03-16 LAB — CBC WITH DIFFERENTIAL/PLATELET
Abs Immature Granulocytes: 0.04 10*3/uL (ref 0.00–0.07)
Basophils Absolute: 0 10*3/uL (ref 0.0–0.1)
Basophils Relative: 0 %
Eosinophils Absolute: 0.1 10*3/uL (ref 0.0–0.5)
Eosinophils Relative: 1 %
HCT: 37.7 % (ref 36.0–46.0)
Hemoglobin: 11.9 g/dL — ABNORMAL LOW (ref 12.0–15.0)
Immature Granulocytes: 0 %
Lymphocytes Relative: 13 %
Lymphs Abs: 1.4 10*3/uL (ref 0.7–4.0)
MCH: 29.9 pg (ref 26.0–34.0)
MCHC: 31.6 g/dL (ref 30.0–36.0)
MCV: 94.7 fL (ref 80.0–100.0)
Monocytes Absolute: 0.7 10*3/uL (ref 0.1–1.0)
Monocytes Relative: 6 %
Neutro Abs: 8.1 10*3/uL — ABNORMAL HIGH (ref 1.7–7.7)
Neutrophils Relative %: 80 %
Platelets: 239 10*3/uL (ref 150–400)
RBC: 3.98 MIL/uL (ref 3.87–5.11)
RDW: 13.5 % (ref 11.5–15.5)
WBC: 10.3 10*3/uL (ref 4.0–10.5)
nRBC: 0 % (ref 0.0–0.2)

## 2021-03-16 LAB — COMPREHENSIVE METABOLIC PANEL
ALT: 29 U/L (ref 0–44)
AST: 27 U/L (ref 15–41)
Albumin: 3 g/dL — ABNORMAL LOW (ref 3.5–5.0)
Alkaline Phosphatase: 42 U/L (ref 38–126)
Anion gap: 7 (ref 5–15)
BUN: 10 mg/dL (ref 8–23)
CO2: 27 mmol/L (ref 22–32)
Calcium: 8 mg/dL — ABNORMAL LOW (ref 8.9–10.3)
Chloride: 103 mmol/L (ref 98–111)
Creatinine, Ser: 0.86 mg/dL (ref 0.44–1.00)
GFR, Estimated: >60 mL/min (ref >60)
Glucose, Bld: 171 mg/dL — ABNORMAL HIGH (ref 70–99)
Potassium: 3.8 mmol/L (ref 3.5–5.1)
Sodium: 137 mmol/L (ref 135–145)
Total Bilirubin: 0.9 mg/dL (ref 0.3–1.2)
Total Protein: 5.6 g/dL — ABNORMAL LOW (ref 6.5–8.1)

## 2021-03-16 LAB — MAGNESIUM: Magnesium: 1.6 mg/dL — ABNORMAL LOW (ref 1.7–2.4)

## 2021-03-16 LAB — GLUCOSE, CAPILLARY
Glucose-Capillary: 185 mg/dL — ABNORMAL HIGH (ref 70–99)
Glucose-Capillary: 150 mg/dL — ABNORMAL HIGH (ref 70–99)
Glucose-Capillary: 179 mg/dL — ABNORMAL HIGH (ref 70–99)
Glucose-Capillary: 194 mg/dL — ABNORMAL HIGH (ref 70–99)

## 2021-03-16 LAB — PHOSPHORUS: Phosphorus: 2.3 mg/dL — ABNORMAL LOW (ref 2.5–4.6)

## 2021-03-16 MED ORDER — MAGNESIUM SULFATE 2 GM/50ML IV SOLN
2.0000 g | Freq: Once | INTRAVENOUS | Status: AC
  Administered 2021-03-16: 2 g via INTRAVENOUS
  Filled 2021-03-16: qty 50

## 2021-03-16 MED ORDER — DIPHENHYDRAMINE HCL 50 MG/ML IJ SOLN
25.0000 mg | Freq: Three times a day (TID) | INTRAMUSCULAR | Status: DC | PRN
  Administered 2021-03-16 – 2021-03-20 (×4): 25 mg via INTRAVENOUS
  Filled 2021-03-16 (×4): qty 1

## 2021-03-16 MED ORDER — POTASSIUM PHOSPHATES 15 MMOLE/5ML IV SOLN
20.0000 mmol | Freq: Once | INTRAVENOUS | Status: AC
  Administered 2021-03-16: 20 mmol via INTRAVENOUS
  Filled 2021-03-16: qty 6.67

## 2021-03-16 NOTE — Care Management Important Message (Signed)
Important Message  Patient Details  Name: [name] MRN: [id number] Date of Birth: September 16, 1951   Medicare Important Message Given:  Yes     Corey Harold 03/16/2021, 10:38 AM

## 2021-03-16 NOTE — Progress Notes (Signed)
PROGRESS NOTE   [name]  RJJ:[id number] DOB: December 29, 1950 DOA: 03/12/2021 PCP: Mirna Mires, MD   Chief Complaint  Patient presents with  . Chest Pain   Level of care: Med-Surg  Brief Admission History:   70 y.o. female with medical history significant for diabetes mellitus, hypertension, asthma.  Patient presented to the ED with complaints of abdominal pain, chest pain, vomiting and diarrhea.  She reports midsternal chest pain has been ongoing since discharge from the hospital, she was recently admitted for same, cardiology was consulted, symptoms were thought not consistent with cardiac etiology, her calculated coronary calcium score was 0 by coronary CT 05/2019, GI evaluation was recommended.  She describes burning midsternal chest pain worse when she vomits.  She reports onset of vomiting today, she has had 6 episodes of vomiting today, no blood.  She also reports 6 episodes of bloody loose stools today.  She reports associated abdominal pain right lower abdomen, and bloating and intermittent burning pain with urination.  Assessment & Plan:   Principal Problem:   Intractable abdominal pain Active Problems:   Diabetes (HCC)   Essential hypertension   Chest pain at rest   Incisional hernia, without obstruction or gangrene   Malrotation of intestine   Abdominal pain   Non-intractable vomiting   Abdominal pain - on 03/15/21 patient underwent  Exploratory laparotomy, lysis of adhesions/reduction of small bowel to the right side of the abdomen and colon to the left side of abdomen (Ladd's procedure), incisional hernia repair with mesh (Ventralight ST Mesh 20.3X 25.4cm)  -No BM, scant flatus -still Awaiting return of bowel function and tolerance of oral intake   Atypical chest pain -her symptoms are suggesting uncontrolled gastritis, continue Protonix and appreciate GI consultation  Type 2 diabetes mellitus with neurological complications - continue SSI coverage and frequent  CBG  Monitoring.    Leukocytosis - reactive, resolved now with normal WBC.   FEN--- continue IV fluids while  Awaiting return of bowel function and tolerance of oral intake  DVT prophylaxis: enoxaparin  Code Status: full  Family Communication: pt Disposition: home  Status is: Inpatient  Remains inpatient appropriate because:--Now Awaiting return of bowel function and tolerance of oral intake   Dispo: The patient is from: Home              Anticipated d/c is to: Home              Patient currently is not medically stable to d/c.   Difficult to place patient No   Consultants:   GI   Surgery   Procedures:   NG placement  -Exploratory laparotomy on 03/15/2021 Antimicrobials:  Ceftriaxone stopped 5/30 metronidazole stopped 5/30  Subjective: 2 female relatives at bedside, no emesis, -No BM, scant flatus  Objective: Vitals:   03/16/21 0603 03/16/21 1338 03/16/21 1406 03/16/21 1408  BP:  134/60 (!) 141/62   Pulse: 86  97   Resp: 19     Temp:   99.1 F (37.3 C)   TempSrc:   Oral   SpO2: 99%  (!) 82% 93%  Weight:      Height:        Intake/Output Summary (Last 24 hours) at 03/16/2021 1826 Last data filed at 03/16/2021 1500 Gross per 24 hour  Intake 1811.94 ml  Output 500 ml  Net 1311.94 ml   Filed Weights   03/12/21 1124  Weight: 85.7 kg    Examination:  General exam: chronically ill appearing, In no apparent distress  Nose- NG tube  Respiratory system: Clear to auscultation. Respiratory effort normal. Cardiovascular system: normal S1 & S2 heard. No JVD, murmurs, rubs,  Gastrointestinal system: Diminished bowel sounds, appropriate postop tenderness Central nervous system: Alert and oriented. No focal neurological deficits. Extremities: Symmetric 5 x 5 power. Skin: No rashes, lesions or ulcers.   Psychiatry: Judgement and insight appear normal. Mood & affect appropriate.   Data Reviewed: I have personally reviewed following labs and imaging  studies  CBC: Recent Labs  Lab 03/12/21 1121 03/13/21 0611 03/14/21 0623 03/16/21 0531  WBC 21.7* 10.7* 7.4 10.3  NEUTROABS 17.7*  --  3.8 8.1*  HGB 14.7 12.5 12.0 11.9*  HCT 46.3* 40.0 38.7 37.7  MCV 93.2 95.0 97.0 94.7  PLT 257 231 211 239    Basic Metabolic Panel: Recent Labs  Lab 03/12/21 1121 03/13/21 0611 03/14/21 0623 03/15/21 0524 03/16/21 0531  NA 134* 136 138 135 137  K 3.7 4.0 3.7 3.6 3.8  CL 97* 101 103 100 103  CO2 27 28 28 29 27   GLUCOSE 178* 195* 150* 184* 171*  BUN 12 15 10 8 10   CREATININE 0.81 0.86 0.75 0.78 0.86  CALCIUM 9.4 8.2* 8.0* 8.3* 8.0*  MG  --   --  1.5*  --  1.6*  PHOS  --   --   --   --  2.3*    GFR: Estimated Creatinine Clearance: 65.8 mL/min (by C-G formula based on SCr of 0.86 mg/dL).  Liver Function Tests: Recent Labs  Lab 03/12/21 1121 03/14/21 0623 03/15/21 0524 03/16/21 0531  AST 24 33 26 27  ALT 20 30 29 29   ALKPHOS 66 46 49 42  BILITOT 0.6 0.8 0.5 0.9  PROT 7.8 5.8* 6.5 5.6*  ALBUMIN 4.3 3.1* 3.5 3.0*    CBG: Recent Labs  Lab 03/15/21 1956 03/16/21 0253 03/16/21 0732 03/16/21 1007 03/16/21 1618  GLUCAP 182* 185* 150* 179* 194*    Recent Results (from the past 240 hour(s))  Culture, Urine     Status: Abnormal   Collection Time: 03/12/21  3:21 PM   Specimen: Urine, Clean Catch  Result Value Ref Range Status   Specimen Description   Final    URINE, CLEAN CATCH Performed at [facility], [address] [place], [facility] [address]    Special Requests   Final    NONE Performed at [facility], [address] [place], [facility] [address]    Culture MULTIPLE SPECIES PRESENT, SUGGEST RECOLLECTION (A)  Final   Report Status 03/14/2021 FINAL  Final  Resp Panel by RT-PCR (Flu A&B, Covid) Nasopharyngeal Swab     Status: None   Collection Time: 03/12/21  3:56 PM   Specimen: Nasopharyngeal Swab; Nasopharyngeal(NP) swabs in vial transport medium  Result Value Ref Range Status   SARS Coronavirus 2 by RT PCR  NEGATIVE NEGATIVE Final    Comment: (NOTE) SARS-CoV-2 target nucleic acids are NOT DETECTED.  The SARS-CoV-2 RNA is generally detectable in upper respiratory specimens during the acute phase of infection. The lowest concentration of SARS-CoV-2 viral copies this assay can detect is 138 copies/mL. A negative result does not preclude SARS-Cov-2 infection and should not be used as the sole basis for treatment or other patient management decisions. A negative result may occur with  improper specimen collection/handling, submission of specimen other than nasopharyngeal swab, presence of viral mutation(s) within the areas targeted by this assay, and inadequate number of viral copies(<138 copies/mL). A negative result must be combined with clinical  observations, patient history, and epidemiological information. The expected result is Negative.  Fact Sheet for Patients:  BloggerCourse.com  Fact Sheet for Healthcare Providers:  SeriousBroker.it  This test is no t yet approved or cleared by the [place] FDA and  has been authorized for detection and/or diagnosis of SARS-CoV-2 by FDA under an Emergency Use Authorization (EUA). This EUA will remain  in effect (meaning this test can be used) for the duration of the COVID-19 declaration under Section 564(b)(1) of the Act, 21 U.S.C.section 360bbb-3(b)(1), unless the authorization is terminated  or revoked sooner.       Influenza A by PCR NEGATIVE NEGATIVE Final   Influenza B by PCR NEGATIVE NEGATIVE Final    Comment: (NOTE) The Xpert Xpress SARS-CoV-2/FLU/RSV plus assay is intended as an aid in the diagnosis of influenza from Nasopharyngeal swab specimens and should not be used as a sole basis for treatment. Nasal washings and aspirates are unacceptable for Xpert Xpress SARS-CoV-2/FLU/RSV testing.  Fact Sheet for Patients: BloggerCourse.com  Fact Sheet for  Healthcare Providers: SeriousBroker.it  This test is not yet approved or cleared by the [place] FDA and has been authorized for detection and/or diagnosis of SARS-CoV-2 by FDA under an Emergency Use Authorization (EUA). This EUA will remain in effect (meaning this test can be used) for the duration of the COVID-19 declaration under Section 564(b)(1) of the Act, 21 U.S.C. section 360bbb-3(b)(1), unless the authorization is terminated or revoked.  Performed at [facility], [address] [place], [place] [address]   Surgical pcr screen     Status: None   Collection Time: 03/15/21 12:41 AM   Specimen: Nasal Mucosa; Nasal Swab  Result Value Ref Range Status   MRSA, PCR NEGATIVE NEGATIVE Final   Staphylococcus aureus NEGATIVE NEGATIVE Final    Comment: (NOTE) The Xpert SA Assay (FDA approved for NASAL specimens in patients 65 years of age and older), is one component of a comprehensive surveillance program. It is not intended to diagnose infection nor to guide or monitor treatment. Performed at [facility], [address] [place], [place] [address]      Radiology Studies: No results found.  Scheduled Meds: . Chlorhexidine Gluconate Cloth  6 each Topical Daily  . enoxaparin (LOVENOX) injection  40 mg Subcutaneous Q24H  . insulin aspart  0-9 Units Subcutaneous Q6H  . ketorolac  15 mg Intravenous Q6H  . pantoprazole (PROTONIX) IV  40 mg Intravenous Q12H   Continuous Infusions: . sodium chloride 75 mL/hr at 03/16/21 1700     LOS: 3 days   Shon Hale, MD How to contact the [facility] Attending or Consulting provider 7A - 7P or covering provider during after hours 7P -7A, for this patient?  1. Check the care team in [facility] and look for a) attending/consulting [facility] provider listed and b) the [facility] team listed 2. Log into [email or web address] and use [place]'s universal password to access. If you do not have the password, please contact the hospital  operator. 3. Locate the [facility] provider you are looking for under [facility] and page to a number that you can be directly reached. 4. If you still have difficulty reaching the provider, please page the [facility] (Director on Call) for the Hospitalists listed on amion for assistance.  03/16/2021, 6:26 PM

## 2021-03-16 NOTE — Progress Notes (Addendum)
[facility] Progress Note  1 Day Post-Op  Subjective: Sore and having flatus. No BMs.   Objective: Vital signs in last 24 hours: Temp:  [97.5 F (36.4 C)-100.9 F (38.3 C)] 100 F (37.8 C) (06/02 0341) Pulse Rate:  [85-96] 86 (06/02 0603) Resp:  [15-19] 19 (06/02 0603) BP: (106-150)/(58-82) 137/58 (06/02 0341) SpO2:  [78 %-100 %] 99 % (06/02 0603) Last BM Date: 03/12/21  Intake/Output from previous day: 06/01 0701 - 06/02 0700 In: 3709.4 [I.V.:3609.4; IV Piggyback:100] Out: 2200 [Urine:2050; Emesis/NG output:150] Intake/Output this shift: No intake/output data recorded.  General appearance: alert, cooperative and no distress Resp: normal work of breathing GI: soft, mildly distended, dressing in place, reinforced in the lower portion intact, more tender in lower abdomen  Lab Results:  Recent Labs    03/14/21 0623 03/16/21 0531  WBC 7.4 10.3  HGB 12.0 11.9*  HCT 38.7 37.7  PLT 211 239   BMET Recent Labs    03/15/21 0524 03/16/21 0531  NA 135 137  K 3.6 3.8  CL 100 103  CO2 29 27  GLUCOSE 184* 171*  BUN 8 10  CREATININE 0.78 0.86  CALCIUM 8.3* 8.0*   PT/INR No results for input(s): LABPROT, INR in the last 72 hours.  Studies/Results: No results found.  Anti-infectives: Anti-infectives (From admission, onward)   Start     Dose/Rate Route Frequency Ordered Stop   03/15/21 0600  cefoTEtan (CEFOTAN) 2 g in sodium chloride 0.9 % 100 mL IVPB        2 g 200 mL/hr over 30 Minutes Intravenous On call to O.R. 03/14/21 1521 03/15/21 2106   03/12/21 1830  metroNIDAZOLE (FLAGYL) IVPB 500 mg  Status:  Discontinued        500 mg 100 mL/hr over 60 Minutes Intravenous Every 8 hours 03/12/21 1755 03/13/21 1636   03/12/21 1800  cefTRIAXone (ROCEPHIN) 2 g in sodium chloride 0.9 % 100 mL IVPB  Status:  Discontinued        2 g 200 mL/hr over 30 Minutes Intravenous Every 24 hours 03/12/21 1755 03/13/21 1636      Assessment/Plan: Ms. Harling is a  70 yo s/p Exploratory laparotomy, lysis of adhesions/reduction of small bowel to the right side of the abdomen and colon to the left side of abdomen (Ladd's procedure), incisional hernia repair with mesh (Ventralight ST Mesh 20.3X 25.4cm). Doing well but sore.  PRN for pain IS, OOB, needs IS to room NPO, NG in place until BM having flatus Lytes good, UOP good, can dc foley H&H stable, no leukocytosis SCDs, lovenox Ambulate today, binder  Low grade fever likely from atelectasis    LOS: 3 days    Lucretia Roers 03/16/2021

## 2021-03-17 ENCOUNTER — Inpatient Hospital Stay (HOSPITAL_COMMUNITY): Payer: Medicare Other

## 2021-03-17 ENCOUNTER — Other Ambulatory Visit: Payer: Self-pay

## 2021-03-17 DIAGNOSIS — I6523 Occlusion and stenosis of bilateral carotid arteries: Secondary | ICD-10-CM

## 2021-03-17 LAB — COMPREHENSIVE METABOLIC PANEL
ALT: 25 U/L (ref 0–44)
AST: 19 U/L (ref 15–41)
Albumin: 2.8 g/dL — ABNORMAL LOW (ref 3.5–5.0)
Alkaline Phosphatase: 41 U/L (ref 38–126)
Anion gap: 8 (ref 5–15)
BUN: 12 mg/dL (ref 8–23)
CO2: 25 mmol/L (ref 22–32)
Calcium: 7.9 mg/dL — ABNORMAL LOW (ref 8.9–10.3)
Chloride: 105 mmol/L (ref 98–111)
Creatinine, Ser: 0.83 mg/dL (ref 0.44–1.00)
GFR, Estimated: >60 mL/min (ref >60)
Glucose, Bld: 176 mg/dL — ABNORMAL HIGH (ref 70–99)
Potassium: 4.1 mmol/L (ref 3.5–5.1)
Sodium: 138 mmol/L (ref 135–145)
Total Bilirubin: 1.3 mg/dL — ABNORMAL HIGH (ref 0.3–1.2)
Total Protein: 5.8 g/dL — ABNORMAL LOW (ref 6.5–8.1)

## 2021-03-17 LAB — RENAL FUNCTION PANEL
Albumin: 2.9 g/dL — ABNORMAL LOW (ref 3.5–5.0)
Anion gap: 7 (ref 5–15)
BUN: 12 mg/dL (ref 8–23)
CO2: 26 mmol/L (ref 22–32)
Calcium: 7.9 mg/dL — ABNORMAL LOW (ref 8.9–10.3)
Chloride: 105 mmol/L (ref 98–111)
Creatinine, Ser: 0.85 mg/dL (ref 0.44–1.00)
GFR, Estimated: >60 mL/min (ref >60)
Glucose, Bld: 176 mg/dL — ABNORMAL HIGH (ref 70–99)
Phosphorus: 2 mg/dL — ABNORMAL LOW (ref 2.5–4.6)
Potassium: 4.1 mmol/L (ref 3.5–5.1)
Sodium: 138 mmol/L (ref 135–145)

## 2021-03-17 LAB — GLUCOSE, CAPILLARY
Glucose-Capillary: 120 mg/dL — ABNORMAL HIGH (ref 70–99)
Glucose-Capillary: 148 mg/dL — ABNORMAL HIGH (ref 70–99)
Glucose-Capillary: 150 mg/dL — ABNORMAL HIGH (ref 70–99)
Glucose-Capillary: 164 mg/dL — ABNORMAL HIGH (ref 70–99)
Glucose-Capillary: 169 mg/dL — ABNORMAL HIGH (ref 70–99)
Glucose-Capillary: 182 mg/dL — ABNORMAL HIGH (ref 70–99)
Glucose-Capillary: 198 mg/dL — ABNORMAL HIGH (ref 70–99)

## 2021-03-17 NOTE — Progress Notes (Signed)
[facility]  Dressing removed. Some SS drainage at bottom. Reports it was more bloody earlier. May have had a little hematoma that evacuated. ABD and papertape to area. Binder.  Will monitor. Labs in AM.  [name], MD [facility] [address]ella R[address] [place], [place] [address] [phone] (office)

## 2021-03-17 NOTE — Progress Notes (Signed)
Patients dressing to abdomen bottom 3rd of honey comb saturated with blood. Small amount of blood oozing out between two lower staples. Surgical dressing reinforced. Made MD Bridges aware.

## 2021-03-17 NOTE — Progress Notes (Signed)
Patient voided once this shift. Up to bedside commode with staff.  Urine was tea colored.

## 2021-03-17 NOTE — Plan of Care (Signed)
  Problem: Acute Rehab PT Goals(only PT should resolve) Goal: Patient Will Transfer Sit To/From Stand Outcome: Progressing Flowsheets (Taken 03/17/2021 1117) Patient will transfer sit to/from stand: with supervision Goal: Pt Will Transfer Bed To Chair/Chair To Bed Outcome: Progressing Flowsheets (Taken 03/17/2021 1117) Pt will Transfer Bed to Chair/Chair to Bed: with supervision Goal: Pt Will Ambulate Outcome: Progressing Flowsheets (Taken 03/17/2021 1117) Pt will Ambulate:  > 125 feet  with supervision  with least restrictive assistive device Goal: Pt/caregiver will Perform Home Exercise Program Outcome: Progressing Flowsheets (Taken 03/17/2021 1117) Pt/caregiver will Perform Home Exercise Program:  For increased strengthening  For improved balance  Independently  11:17 AM, 03/17/21 Wyman Songster PT, DPT Physical Therapist at [facility]

## 2021-03-17 NOTE — Progress Notes (Signed)
Patient was transferred from the bed to bathroom. Dressing to patients abdomen saturated at bottom 3rd. Changed dressing. Made MD Bridges aware.

## 2021-03-17 NOTE — Progress Notes (Signed)
[facility] Progress Note  2 Days Post-Op  Subjective: Having flatus and no nausea, ng without much out. Pain ok.  KUB done and normal bowel gas. RN reported more bleeding from bottom edge of wound after I removed reinforced dressing today.   Objective: Vital signs in last 24 hours: Temp:  [99.1 F (37.3 C)-100.2 F (37.9 C)] 99.3 F (37.4 C) (06/03 0502) Pulse Rate:  [97-109] 101 (06/03 0502) Resp:  [15] 15 (06/03 0502) BP: (134-174)/(60-71) 135/60 (06/03 0502) SpO2:  [82 %-96 %] 96 % (06/03 0502) Last BM Date: 03/12/21  Intake/Output from previous day: 06/02 0701 - 06/03 0700 In: 1002.6 [I.V.:723; IV Piggyback:279.6] Out: 275 [Urine:275] Intake/Output this shift: No intake/output data recorded.  General appearance: alert, cooperative and no distress GI: soft, nondistended, tender appropriately, inferior wound with some bleeding  Lab Results:  Recent Labs    03/16/21 0531  WBC 10.3  HGB 11.9*  HCT 37.7  PLT 239   BMET Recent Labs    03/16/21 0531 03/17/21 0545  NA 137 138  138  K 3.8 4.1  4.1  CL 103 105  105  CO2 27 25  26   GLUCOSE 171* 176*  176*  BUN 10 12  12   CREATININE 0.86 0.83  0.85  CALCIUM 8.0* 7.9*  7.9*   PT/INR No results for input(s): LABPROT, INR in the last 72 hours.  Studies/Results: DG Abd 1 View  Result Date: 03/17/2021 CLINICAL DATA:  Hypoxemia. Abdominal pain. Diabetes. Recent abdominal surgery. EXAM: ABDOMEN - 1 VIEW COMPARISON:  Radiographs and CT 03/12/2021. FINDINGS: 0849 hours. Two portable supine views are submitted. The visualized bowel gas pattern is nonobstructive. There is no supine evidence of free intraperitoneal air. New skin staples are present over the left lower abdominal wall. There are extensive postsurgical changes throughout the thoracolumbar spine. Nasogastric tube has been removed in the interval. IMPRESSION: Evidence of interval abdominal surgery. No acute abdominal findings identified  radiographically. Electronically Signed   By: 05/17/2021 M.D.   On: 03/17/2021 10:33   DG Chest Port 1 View  Result Date: 03/17/2021 CLINICAL DATA:  Hypoxemia. EXAM: PORTABLE CHEST 1 VIEW COMPARISON:  03/13/2021 and 03/12/2021. FINDINGS: 0855 hours. Presumed new endotracheal tube projecting over the mid trachea. The heart size and mediastinal contours are stable. There is stable elevation of the right hemidiaphragm with mild chronic right basilar atelectasis. The lungs are otherwise clear. There is no pleural effusion or pneumothorax. Cholecystectomy clips and prior thoracolumbar fusion are noted. IMPRESSION: Presumed interval intubation with satisfactory position of the endotracheal tube. No other significant changes. Electronically Signed   By: 03/15/2021 M.D.   On: 03/17/2021 10:30    Anti-infectives: Anti-infectives (From admission, onward)   Start     Dose/Rate Route Frequency Ordered Stop   03/15/21 0600  cefoTEtan (CEFOTAN) 2 g in sodium chloride 0.9 % 100 mL IVPB        2 g 200 mL/hr over 30 Minutes Intravenous On call to O.R. 03/14/21 1521 03/15/21 2106   03/12/21 1830  metroNIDAZOLE (FLAGYL) IVPB 500 mg  Status:  Discontinued        500 mg 100 mL/hr over 60 Minutes Intravenous Every 8 hours 03/12/21 1755 03/13/21 1636   03/12/21 1800  cefTRIAXone (ROCEPHIN) 2 g in sodium chloride 0.9 % 100 mL IVPB  Status:  Discontinued        2 g 200 mL/hr over 30 Minutes Intravenous Every 24 hours 03/12/21 1755 03/13/21 1636  Assessment/Plan: [name] is a 70 yo s/p Exploratory laparotomy, lysis of adhesions/reduction of small bowel to the right side of the abdomen and colon to the left side of abdomen (Ladd's procedure),incisionalhernia repair with mesh (Ventralight ST Mesh 20.3X 25.4cm). PRN for pain IS, OOB Clears  Lytes good, UOP good, can dc foley Bleeding from edges wound, reinforced, will monitor  SCDs, lovenox Ambulate today, binder, PT  Low grade fever,  monitor, likely atelectasis    LOS: 4 days    [name] 03/17/2021

## 2021-03-17 NOTE — Evaluation (Signed)
Physical Therapy Evaluation Patient Details Name: [name] MRN: [id number] DOB: Jun 07, 1951 Today's Date: 03/17/2021   History of Present Illness  Ms. Ruark is a 70 yo s/p Exploratory laparotomy, lysis of adhesions/reduction of small bowel to the right side of the abdomen and colon to the left side of abdomen (Ladd's procedure), incisional hernia repair with mesh (Ventralight ST Mesh 20.3X 25.4cm).    Clinical Impression  Patient limited for functional mobility as stated below secondary to BLE weakness, fatigue and impaired balance. Patient with slow labored transition to seated EOB with use of bed rails. She demonstrates good sitting balance and sitting tolerance EOB. Patient requires min A to transfer to standing with use of RW secondary to LE weakness. Patient ambulates with RW without loss of balance. Patient left seated in chair at end of session with sister present. Patient will benefit from continued physical therapy in hospital and recommended venue below to increase strength, balance, endurance for safe ADLs and gait.     Follow Up Recommendations SNF;Supervision - Intermittent;Supervision for mobility/OOB    Equipment Recommendations  None recommended by PT    Recommendations for Other Services       Precautions / Restrictions Precautions Precautions: Fall Restrictions Weight Bearing Restrictions: No      Mobility  Bed Mobility Overal bed mobility: Needs Assistance Bed Mobility: Supine to Sit     Supine to sit: Min guard     General bed mobility comments: slow, labored transition to seated with use of bed rails    Transfers Overall transfer level: Needs assistance Equipment used: Rolling walker (2 wheeled) Transfers: Sit to/from [employer] Sit to Stand: Min assist Stand pivot transfers: Min assist       General transfer comment: requires assist and RW for impaired LE strength  Ambulation/Gait Ambulation/Gait assistance:  Min guard Gait Distance (Feet): 100 Feet Assistive device: Rolling walker (2 wheeled) Gait Pattern/deviations: Decreased step length - left;Decreased step length - right;Decreased stride length Gait velocity: decreased   General Gait Details: slow cadence with use of RW  Stairs            Wheelchair Mobility    Modified Rankin (Stroke Patients Only)       Balance Overall balance assessment: Needs assistance   Sitting balance-Leahy Scale: Good Sitting balance - Comments: seated EOB   Standing balance support: Bilateral upper extremity supported Standing balance-Leahy Scale: Fair Standing balance comment: with RW                             Pertinent Vitals/Pain Pain Assessment: No/denies pain    Home Living Family/patient expects to be discharged to:: Private residence Living Arrangements: Other relatives (sister) Available Help at Discharge: Family (lives with sister who can not phsyically assist) Type of Home: House Home Access: Ramped entrance     Home Layout: One level Home Equipment: Environmental consultant - 2 wheels;Cane - single point;Shower seat;Bedside commode      Prior Function Level of Independence: Independent         Comments: Patient states community ambulation without AD, Independent with basic ADL     Hand Dominance        Extremity/Trunk Assessment   Upper Extremity Assessment Upper Extremity Assessment: Overall WFL for tasks assessed    Lower Extremity Assessment Lower Extremity Assessment: Generalized weakness    Cervical / Trunk Assessment Cervical / Trunk Assessment: Normal  Communication   Communication: No difficulties  Cognition  Arousal/Alertness: Awake/alert Behavior During Therapy: WFL for tasks assessed/performed Overall Cognitive Status: Within Functional Limits for tasks assessed                                        General Comments      Exercises     Assessment/Plan    PT Assessment  Patient needs continued PT services  PT Problem List Decreased strength;Decreased activity tolerance;Decreased balance;Decreased mobility;Decreased skin integrity       PT Treatment Interventions DME instruction;Balance training;Gait training;Neuromuscular re-education;Stair training;Functional mobility training;Patient/family education;Therapeutic activities;Therapeutic exercise    PT Goals (Current goals can be found in the Care Plan section)  Acute Rehab PT Goals Patient Stated Goal: Return home PT Goal Formulation: With patient Time For Goal Achievement: 03/31/21 Potential to Achieve Goals: Good    Frequency Min 3X/week   Barriers to discharge        Co-evaluation               AM-PAC PT "6 Clicks" Mobility  Outcome Measure Help needed turning from your back to your side while in a flat bed without using bedrails?: None Help needed moving from lying on your back to sitting on the side of a flat bed without using bedrails?: A Little Help needed moving to and from a bed to a chair (including a wheelchair)?: A Little Help needed standing up from a chair using your arms (e.g., wheelchair or bedside chair)?: A Little Help needed to walk in hospital room?: A Little Help needed climbing 3-5 steps with a railing? : A Lot 6 Click Score: 18    End of Session Equipment Utilized During Treatment: Gait belt Activity Tolerance: Patient tolerated treatment well;Patient limited by fatigue Patient left: in chair;with call bell/phone within reach;with family/visitor present Nurse Communication: Mobility status PT Visit Diagnosis: Unsteadiness on feet (R26.81);Other abnormalities of gait and mobility (R26.89);Muscle weakness (generalized) (M62.81)    Time: 7619-5093 PT Time Calculation (min) (ACUTE ONLY): 27 min   Charges:   PT Evaluation $PT Eval Low Complexity: 1 Low PT Treatments $Therapeutic Activity: 8-22 mins        11:15 AM, 03/17/21 Wyman Songster PT,  DPT Physical Therapist at [facility]

## 2021-03-17 NOTE — Progress Notes (Signed)
PROGRESS NOTE   [name]  WUJ:[id number] DOB: [birth year] DOA: 03/12/2021 PCP: Mirna MiresHill, Gerald, MD   Chief Complaint  Patient presents with  . Chest Pain   Level of care: Med-Surg  Brief Admission History:   70 y.o. female with medical history significant for diabetes mellitus, hypertension, asthma.  Patient presented to the ED with complaints of abdominal pain, chest pain, vomiting and diarrhea.  She reports midsternal chest pain has been ongoing since discharge from the hospital, she was recently admitted for same, cardiology was consulted, symptoms were thought not consistent with cardiac etiology, her calculated coronary calcium score was 0 by coronary CT 05/2019, GI evaluation was recommended.  She describes burning midsternal chest pain worse when she vomits.  She reports onset of vomiting today, she has had 6 episodes of vomiting today, no blood.  She also reports 6 episodes of bloody loose stools today.  She reports associated abdominal pain right lower abdomen, and bloating and intermittent burning pain with urination.  Assessment & Plan:   Principal Problem:   Intractable abdominal pain Active Problems:   Diabetes (HCC)   Essential hypertension   Chest pain at rest   Incisional hernia, without obstruction or gangrene   Malrotation of intestine   Abdominal pain   Non-intractable vomiting   Abdominal pain - on 03/15/21 patient underwent  Exploratory laparotomy, lysis of adhesions/reduction of small bowel to the right side of the abdomen and colon to the left side of abdomen (Ladd's procedure), incisional hernia repair with mesh (Ventralight ST Mesh 20.3X 25.4cm)  -No BM, scant flatus -still Awaiting return of bowel function and tolerance of oral intake 03/17/21--patient accidentally pulled out NG tube  Atypical chest pain -her symptoms are suggesting uncontrolled gastritis, continue Protonix and appreciate GI consultation  Type 2 diabetes mellitus with neurological  complications - continue SSI coverage and frequent CBG  Monitoring.    Leukocytosis - reactive, resolved now with normal WBC.   FEN--- continue IV fluids while  Awaiting return of bowel function and tolerance of oral intake  DVT prophylaxis: enoxaparin  Code Status: full  Family Communication: pt Disposition: home  Status is: Inpatient  Remains inpatient appropriate because:--Now Awaiting return of bowel function and tolerance of oral intake   Dispo: The patient is from: Home              Anticipated d/c is to: Home              Patient currently is not medically stable to d/c.   Difficult to place patient No   Consultants:   GI   Surgery   Procedures:   NG placement  -Exploratory laparotomy on 03/15/2021 Antimicrobials:  Ceftriaxone stopped 5/30 metronidazole stopped 5/30  Subjective: Older sister at bedside, no emesis, -No BM, scant flatus -Patient accidentally pulled out  NG tube  Objective: Vitals:   03/16/21 1408 03/16/21 2017 03/17/21 0502 03/17/21 1409  BP:  (!) 174/71 135/60 (!) 133/58  Pulse:  (!) 109 (!) 101 81  Resp:  15 15 20   Temp:  100.2 F (37.9 C) 99.3 F (37.4 C) 98.3 F (36.8 C)  TempSrc:   Oral Oral  SpO2: 93% 94% 96% 91%  Weight:      Height:        Intake/Output Summary (Last 24 hours) at 03/17/2021 1908 Last data filed at 03/16/2021 2202 Gross per 24 hour  Intake --  Output 275 ml  Net -275 ml   Filed Weights   03/12/21 1124  Weight: 85.7 kg    Examination:  General exam: chronically ill appearing, In no apparent distress Nose- NG tube out Respiratory system: Clear to auscultation. Respiratory effort normal. Cardiovascular system: normal S1 & S2 heard. No JVD, murmurs, rubs,  Gastrointestinal system: Diminished bowel sounds, appropriate postop tenderness Central nervous system: Alert and oriented. No focal neurological deficits. Extremities: Symmetric 5 x 5 power. Skin: No rashes, lesions or ulcers.   Psychiatry: Judgement  and insight appear normal. Mood & affect appropriate.   Data Reviewed: I have personally reviewed following labs and imaging studies  CBC: Recent Labs  Lab 03/12/21 1121 03/13/21 0611 03/14/21 0623 03/16/21 0531  WBC 21.7* 10.7* 7.4 10.3  NEUTROABS 17.7*  --  3.8 8.1*  HGB 14.7 12.5 12.0 11.9*  HCT 46.3* 40.0 38.7 37.7  MCV 93.2 95.0 97.0 94.7  PLT 257 231 211 239    Basic Metabolic Panel: Recent Labs  Lab 03/13/21 0611 03/14/21 0623 03/15/21 0524 03/16/21 0531 03/17/21 0545  NA 136 138 135 137 138  138  K 4.0 3.7 3.6 3.8 4.1  4.1  CL 101 103 100 103 105  105  CO2 28 28 29 27 25  26   GLUCOSE 195* 150* 184* 171* 176*  176*  BUN 15 10 8 10 12  12   CREATININE 0.86 0.75 0.78 0.86 0.83  0.85  CALCIUM 8.2* 8.0* 8.3* 8.0* 7.9*  7.9*  MG  --  1.5*  --  1.6*  --   PHOS  --   --   --  2.3* 2.0*    GFR: Estimated Creatinine Clearance: 68.2 mL/min (by C-G formula based on SCr of 0.83 mg/dL).  Liver Function Tests: Recent Labs  Lab 03/12/21 1121 03/14/21 0623 03/15/21 0524 03/16/21 0531 03/17/21 0545  AST 24 33 26 27 19   ALT 20 30 29 29 25   ALKPHOS 66 46 49 42 41  BILITOT 0.6 0.8 0.5 0.9 1.3*  PROT 7.8 5.8* 6.5 5.6* 5.8*  ALBUMIN 4.3 3.1* 3.5 3.0* 2.8*  2.9*    CBG: Recent Labs  Lab 03/17/21 0001 03/17/21 0557 03/17/21 0734 03/17/21 1124 03/17/21 1611  GLUCAP 198* 120* 164* 150* 148*    Recent Results (from the past 240 hour(s))  Culture, Urine     Status: Abnormal   Collection Time: 03/12/21  3:21 PM   Specimen: Urine, Clean Catch  Result Value Ref Range Status   Specimen Description   Final    URINE, CLEAN CATCH Performed at [facility], [address], [place], 03/14/21 [facility]    Special Requests   Final    NONE Performed at [facility], [address], [place], [address] [facility]    Culture MULTIPLE SPECIES PRESENT, SUGGEST RECOLLECTION (A)  Final   Report Status 03/14/2021 FINAL  Final  Resp Panel by RT-PCR (Flu A&B, Covid)  Nasopharyngeal Swab     Status: None   Collection Time: 03/12/21  3:56 PM   Specimen: Nasopharyngeal Swab; Nasopharyngeal(NP) swabs in vial transport medium  Result Value Ref Range Status   SARS Coronavirus 2 by RT PCR NEGATIVE NEGATIVE Final    Comment: (NOTE) SARS-CoV-2 target nucleic acids are NOT DETECTED.  The SARS-CoV-2 RNA is generally detectable in upper respiratory specimens during the acute phase of infection. The lowest concentration of SARS-CoV-2 viral copies this assay can detect is 138 copies/mL. A negative result does not preclude SARS-Cov-2 infection and should not be used as the sole basis for treatment or other patient management decisions. A negative  result may occur with  improper specimen collection/handling, submission of specimen other than nasopharyngeal swab, presence of viral mutation(s) within the areas targeted by this assay, and inadequate number of viral copies(<138 copies/mL). A negative result must be combined with clinical observations, patient history, and epidemiological information. The expected result is Negative.  Fact Sheet for Patients:  BloggerCourse.com  Fact Sheet for Healthcare Providers:  SeriousBroker.it  This test is no t yet approved or cleared by the [place] FDA and  has been authorized for detection and/or diagnosis of SARS-CoV-2 by FDA under an Emergency Use Authorization (EUA). This EUA will remain  in effect (meaning this test can be used) for the duration of the COVID-19 declaration under Section 564(b)(1) of the Act, 21 U.S.C.section 360bbb-3(b)(1), unless the authorization is terminated  or revoked sooner.       Influenza A by PCR NEGATIVE NEGATIVE Final   Influenza B by PCR NEGATIVE NEGATIVE Final    Comment: (NOTE) The Xpert Xpress SARS-CoV-2/FLU/RSV plus assay is intended as an aid in the diagnosis of influenza from Nasopharyngeal swab specimens and should not be  used as a sole basis for treatment. Nasal washings and aspirates are unacceptable for Xpert Xpress SARS-CoV-2/FLU/RSV testing.  Fact Sheet for Patients: BloggerCourse.com  Fact Sheet for Healthcare Providers: SeriousBroker.it  This test is not yet approved or cleared by the [place] FDA and has been authorized for detection and/or diagnosis of SARS-CoV-2 by FDA under an Emergency Use Authorization (EUA). This EUA will remain in effect (meaning this test can be used) for the duration of the COVID-19 declaration under Section 564(b)(1) of the Act, 21 U.S.C. section 360bbb-3(b)(1), unless the authorization is terminated or revoked.  Performed at [facility], [address] [place], [place] [address]   Surgical pcr screen     Status: None   Collection Time: 03/15/21 12:41 AM   Specimen: Nasal Mucosa; Nasal Swab  Result Value Ref Range Status   MRSA, PCR NEGATIVE NEGATIVE Final   Staphylococcus aureus NEGATIVE NEGATIVE Final    Comment: (NOTE) The Xpert SA Assay (FDA approved for NASAL specimens in patients 74 years of age and older), is one component of a comprehensive surveillance program. It is not intended to diagnose infection nor to guide or monitor treatment. Performed at [facility], [address] [place], [place] [address]      Radiology Studies: DG Abd 1 View  Result Date: 03/17/2021 CLINICAL DATA:  Hypoxemia. Abdominal pain. Diabetes. Recent abdominal surgery. EXAM: ABDOMEN - 1 VIEW COMPARISON:  Radiographs and CT 03/12/2021. FINDINGS: 0849 hours. Two portable supine views are submitted. The visualized bowel gas pattern is nonobstructive. There is no supine evidence of free intraperitoneal air. New skin staples are present over the left lower abdominal wall. There are extensive postsurgical changes throughout the thoracolumbar spine. Nasogastric tube has been removed in the interval. IMPRESSION: Evidence of interval  abdominal surgery. No acute abdominal findings identified radiographically. Electronically Signed   By: Carey Bullocks M.D.   On: 03/17/2021 10:33   DG Chest Port 1 View  Result Date: 03/17/2021 CLINICAL DATA:  Hypoxemia. EXAM: PORTABLE CHEST 1 VIEW COMPARISON:  03/13/2021 and 03/12/2021. FINDINGS: 0855 hours. Presumed new endotracheal tube projecting over the mid trachea. The heart size and mediastinal contours are stable. There is stable elevation of the right hemidiaphragm with mild chronic right basilar atelectasis. The lungs are otherwise clear. There is no pleural effusion or pneumothorax. Cholecystectomy clips and prior thoracolumbar fusion are noted. IMPRESSION: Presumed interval intubation  with satisfactory position of the endotracheal tube. No other significant changes. Electronically Signed   By: Carey Bullocks M.D.   On: 03/17/2021 10:30    Scheduled Meds: . Chlorhexidine Gluconate Cloth  6 each Topical Daily  . enoxaparin (LOVENOX) injection  40 mg Subcutaneous Q24H  . insulin aspart  0-9 Units Subcutaneous Q6H  . ketorolac  15 mg Intravenous Q6H  . pantoprazole (PROTONIX) IV  40 mg Intravenous Q12H   Continuous Infusions: . sodium chloride 75 mL/hr at 03/17/21 1746     LOS: 4 days   Shon Hale, MD How to contact the [facility] Attending or Consulting provider 7A - 7P or covering provider during after hours 7P -7A, for this patient?  1. Check the care team in [facility] and look for a) attending/consulting TR[facility] provider listed and b) the [facility] team listed 2. Log into [email or web address] and use [employer] universal password to access. If you do not have the password, please contact the hospital operator. 3. Locate the [facility] provider you are looking for under [facility] and page to a number that you can be directly reached. 4. If you still have difficulty reaching the provider, please page the [facility] (Director on Call) for the Hospitalists listed on amion for assistance.  03/17/2021, 7:08  PM

## 2021-03-18 LAB — TYPE AND SCREEN
ABO/RH(D): A POS
Antibody Screen: POSITIVE
Donor AG Type: NEGATIVE
Donor AG Type: NEGATIVE
Unit division: 0
Unit division: 0

## 2021-03-18 LAB — CBC WITH DIFFERENTIAL/PLATELET
Abs Immature Granulocytes: 0.09 10*3/uL — ABNORMAL HIGH (ref 0.00–0.07)
Basophils Absolute: 0 10*3/uL (ref 0.0–0.1)
Basophils Relative: 0 %
Eosinophils Absolute: 0.5 10*3/uL (ref 0.0–0.5)
Eosinophils Relative: 5 %
HCT: 33.6 % — ABNORMAL LOW (ref 36.0–46.0)
Hemoglobin: 10.4 g/dL — ABNORMAL LOW (ref 12.0–15.0)
Immature Granulocytes: 1 %
Lymphocytes Relative: 14 %
Lymphs Abs: 1.3 10*3/uL (ref 0.7–4.0)
MCH: 30.1 pg (ref 26.0–34.0)
MCHC: 31 g/dL (ref 30.0–36.0)
MCV: 97.4 fL (ref 80.0–100.0)
Monocytes Absolute: 0.4 10*3/uL (ref 0.1–1.0)
Monocytes Relative: 5 %
Neutro Abs: 6.7 10*3/uL (ref 1.7–7.7)
Neutrophils Relative %: 75 %
Platelets: 196 10*3/uL (ref 150–400)
RBC: 3.45 MIL/uL — ABNORMAL LOW (ref 3.87–5.11)
RDW: 14.3 % (ref 11.5–15.5)
WBC: 9 10*3/uL (ref 4.0–10.5)
nRBC: 0 % (ref 0.0–0.2)

## 2021-03-18 LAB — BPAM RBC
Blood Product Expiration Date: 202206112359
Unit Type and Rh: 6200

## 2021-03-18 LAB — GLUCOSE, CAPILLARY
Glucose-Capillary: 161 mg/dL — ABNORMAL HIGH (ref 70–99)
Glucose-Capillary: 166 mg/dL — ABNORMAL HIGH (ref 70–99)
Glucose-Capillary: 188 mg/dL — ABNORMAL HIGH (ref 70–99)
Glucose-Capillary: 203 mg/dL — ABNORMAL HIGH (ref 70–99)
Glucose-Capillary: 165 mg/dL — ABNORMAL HIGH (ref 70–99)
Glucose-Capillary: 172 mg/dL — ABNORMAL HIGH (ref 70–99)

## 2021-03-18 LAB — COMPREHENSIVE METABOLIC PANEL
ALT: 32 U/L (ref 0–44)
AST: 28 U/L (ref 15–41)
Albumin: 2.7 g/dL — ABNORMAL LOW (ref 3.5–5.0)
Alkaline Phosphatase: 41 U/L (ref 38–126)
Anion gap: 6 (ref 5–15)
BUN: 13 mg/dL (ref 8–23)
CO2: 28 mmol/L (ref 22–32)
Calcium: 8.1 mg/dL — ABNORMAL LOW (ref 8.9–10.3)
Chloride: 105 mmol/L (ref 98–111)
Creatinine, Ser: 0.79 mg/dL (ref 0.44–1.00)
GFR, Estimated: >60 mL/min (ref >60)
Glucose, Bld: 171 mg/dL — ABNORMAL HIGH (ref 70–99)
Potassium: 4.2 mmol/L (ref 3.5–5.1)
Sodium: 139 mmol/L (ref 135–145)
Total Bilirubin: 0.9 mg/dL (ref 0.3–1.2)
Total Protein: 5.8 g/dL — ABNORMAL LOW (ref 6.5–8.1)

## 2021-03-18 MED ORDER — OXYCODONE HCL 5 MG PO TABS
5.0000 mg | ORAL_TABLET | ORAL | Status: DC | PRN
Start: 2021-03-18 — End: 2021-03-21
  Administered 2021-03-18 – 2021-03-21 (×4): 5 mg via ORAL
  Filled 2021-03-18 (×4): qty 1

## 2021-03-18 MED ORDER — DOCUSATE SODIUM 100 MG PO CAPS
100.0000 mg | ORAL_CAPSULE | Freq: Two times a day (BID) | ORAL | Status: DC
  Administered 2021-03-18 – 2021-03-21 (×7): 100 mg via ORAL
  Filled 2021-03-18 (×7): qty 1

## 2021-03-18 MED ORDER — MORPHINE SULFATE (PF) 2 MG/ML IV SOLN: 2.0000 mg | INTRAVENOUS | Status: DC | PRN

## 2021-03-18 NOTE — Progress Notes (Signed)
[facility]  Doing well and having flatus. Tolerating clears. No drainage from the wound now. ABD in place. Soft, nondistended, appropriately tender.  Will change tomorrow.  Ambulate. Updated RN and Dr. Mariea Clonts.  [name], MD [facility] [address] [place], [place] [address] [phone] (office)

## 2021-03-18 NOTE — Progress Notes (Signed)
PROGRESS NOTE   [name]  WGN:[id number] DOB: September 21, 1951 DOA: 03/12/2021 PCP: Mirna Mires, MD   Chief Complaint  Patient presents with  . Chest Pain   Level of care: Med-Surg  Brief Admission History:   70 y.o. female with medical history significant for diabetes mellitus, hypertension, asthma.  Patient presented to the ED with complaints of abdominal pain, chest pain, vomiting and diarrhea.  She reports midsternal chest pain has been ongoing since discharge from the hospital, she was recently admitted for same, cardiology was consulted, symptoms were thought not consistent with cardiac etiology, her calculated coronary calcium score was 0 by coronary CT 05/2019, GI evaluation was recommended.  She describes burning midsternal chest pain worse when she vomits.  She reports onset of vomiting today, she has had 6 episodes of vomiting today, no blood.  She also reports 6 episodes of bloody loose stools today.  She reports associated abdominal pain right lower abdomen, and bloating and intermittent burning pain with urination.  Assessment & Plan:   Principal Problem:   Intractable abdominal pain Active Problems:   Diabetes (HCC)   Essential hypertension   Chest pain at rest   Incisional hernia, without obstruction or gangrene   Malrotation of intestine   Abdominal pain   Non-intractable vomiting   Abdominal pain - on 03/15/21 patient underwent  Exploratory laparotomy, lysis of adhesions/reduction of small bowel to the right side of the abdomen and colon to the left side of abdomen (Ladd's procedure), incisional hernia repair with mesh (Ventralight ST Mesh 20.3X 25.4cm)  -No BM, scant flatus -still Awaiting return of bowel function and tolerance of oral intake 03/17/21--patient accidentally pulled out NG tube -03/18/21-- clear liquid diet per gen surgeon   Atypical chest pain -her symptoms are suggesting uncontrolled gastritis, continue Protonix and appreciate GI  consultation  Type 2 diabetes mellitus with neurological complications -  -A1c is 9.6 reflecting uncontrolled DM with hyperglycemia PTA Use Novolog/Humalog Sliding scale insulin with Accu-Cheks/Fingersticks as ordered  Leukocytosis - reactive, resolved now with normal WBC.   FEN--- continue IV fluids while  Awaiting return of bowel function and tolerance of oral intake  -Acute anemia due to acute perioperative blood loss --hemoglobin currently greater than 10, transfuse as clinically indicated  DVT prophylaxis: enoxaparin  Code Status: full  Family Communication: pt and sister Disposition: home  Status is: Inpatient  Remains inpatient appropriate because:--Now Awaiting return of bowel function and tolerance of oral intake   Dispo: The patient is from: Home              Anticipated d/c is to: Home              Patient currently is not medically stable to d/c.   Difficult to place patient No   Consultants:   GI   Surgery   Procedures:   NG placement  -Exploratory laparotomy on 03/15/2021 Antimicrobials:  Ceftriaxone stopped 5/30 metronidazole stopped 5/30  Subjective: -tolerating liquid diet ok -No further significant oozing from abdominal wound  Objective: Vitals:   03/17/21 0502 03/17/21 1409 03/17/21 1930 03/18/21 0515  BP: 135/60 (!) 133/58 (!) 142/69 128/63  Pulse: (!) 101 81 89 87  Resp: 15 20 16 17   Temp: 99.3 F (37.4 C) 98.3 F (36.8 C) 98.9 F (37.2 C) 98.8 F (37.1 C)  TempSrc: Oral Oral Oral Oral  SpO2: 96% 91% 98% 100%  Weight:      Height:        Intake/Output Summary (Last 24  hours) at 03/18/2021 1204 Last data filed at 03/18/2021 0150 Gross per 24 hour  Intake 2572.68 ml  Output --  Net 2572.68 ml   Filed Weights   03/12/21 1124  Weight: 85.7 kg    Examination:  General exam: chronically ill appearing, In no apparent distress Nose- [place] 2L/min Respiratory system: Clear to auscultation. Respiratory effort normal. Cardiovascular  system: normal S1 & S2 heard. No JVD, murmurs, rubs,  Gastrointestinal system: Diminished bowel sounds, appropriate postop tenderness, postop wound mostly intact Central nervous system: Alert and oriented. No focal neurological deficits. Extremities: Symmetric 5 x 5 power. Skin: No rashes, lesions or ulcers.   Psychiatry: Judgement and insight appear normal. Mood & affect appropriate.   Data Reviewed: I have personally reviewed following labs and imaging studies  CBC: Recent Labs  Lab 03/12/21 1121 03/13/21 0611 03/14/21 0623 03/16/21 0531 03/18/21 0616  WBC 21.7* 10.7* 7.4 10.3 9.0  NEUTROABS 17.7*  --  3.8 8.1* 6.7  HGB 14.7 12.5 12.0 11.9* 10.4*  HCT 46.3* 40.0 38.7 37.7 33.6*  MCV 93.2 95.0 97.0 94.7 97.4  PLT 257 231 211 239 196    Basic Metabolic Panel: Recent Labs  Lab 03/14/21 0623 03/15/21 0524 03/16/21 0531 03/17/21 0545 03/18/21 0616  NA 138 135 137 138  138 139  K 3.7 3.6 3.8 4.1  4.1 4.2  CL 103 100 103 105  105 105  CO2 28 29 27 25  26 28   GLUCOSE 150* 184* 171* 176*  176* 171*  BUN 10 8 10 12  12 13   CREATININE 0.75 0.78 0.86 0.83  0.85 0.79  CALCIUM 8.0* 8.3* 8.0* 7.9*  7.9* 8.1*  MG 1.5*  --  1.6*  --   --   PHOS  --   --  2.3* 2.0*  --     GFR: Estimated Creatinine Clearance: 70.8 mL/min (by C-G formula based on SCr of 0.79 mg/dL).  Liver Function Tests: Recent Labs  Lab 03/14/21 0623 03/15/21 0524 03/16/21 0531 03/17/21 0545 03/18/21 0616  AST 33 26 27 19 28   ALT 30 29 29 25  32  ALKPHOS 46 49 42 41 41  BILITOT 0.8 0.5 0.9 1.3* 0.9  PROT 5.8* 6.5 5.6* 5.8* 5.8*  ALBUMIN 3.1* 3.5 3.0* 2.8*  2.9* 2.7*    CBG: Recent Labs  Lab 03/17/21 2222 03/18/21 0414 03/18/21 0728 03/18/21 0937 03/18/21 1128  GLUCAP 169* 161* 166* 188* 203*    Recent Results (from the past 240 hour(s))  Culture, Urine     Status: Abnormal   Collection Time: 03/12/21  3:21 PM   Specimen: Urine, Clean Catch  Result Value Ref Range Status    Specimen Description   Final    URINE, CLEAN CATCH Performed at [facility], [address] [place], [place] [address]    Special Requests   Final    NONE Performed at [facility], [address] [place], [place] [address]    Culture MULTIPLE SPECIES PRESENT, SUGGEST RECOLLECTION (A)  Final   Report Status 03/14/2021 FINAL  Final  Resp Panel by RT-PCR (Flu A&B, Covid) Nasopharyngeal Swab     Status: None   Collection Time: 03/12/21  3:56 PM   Specimen: Nasopharyngeal Swab; Nasopharyngeal(NP) swabs in vial transport medium  Result Value Ref Range Status   SARS Coronavirus 2 by RT PCR NEGATIVE NEGATIVE Final    Comment: (NOTE) SARS-CoV-2 target nucleic acids are NOT DETECTED.  The SARS-CoV-2 RNA is generally detectable in upper respiratory specimens during  the acute phase of infection. The lowest concentration of SARS-CoV-2 viral copies this assay can detect is 138 copies/mL. A negative result does not preclude SARS-Cov-2 infection and should not be used as the sole basis for treatment or other patient management decisions. A negative result may occur with  improper specimen collection/handling, submission of specimen other than nasopharyngeal swab, presence of viral mutation(s) within the areas targeted by this assay, and inadequate number of viral copies(<138 copies/mL). A negative result must be combined with clinical observations, patient history, and epidemiological information. The expected result is Negative.  Fact Sheet for Patients:  BloggerCourse.com  Fact Sheet for Healthcare Providers:  SeriousBroker.it  This test is no t yet approved or cleared by the [place] FDA and  has been authorized for detection and/or diagnosis of SARS-CoV-2 by FDA under an Emergency Use Authorization (EUA). This EUA will remain  in effect (meaning this test can be used) for the duration of the COVID-19 declaration under Section  564(b)(1) of the Act, 21 U.S.C.section 360bbb-3(b)(1), unless the authorization is terminated  or revoked sooner.       Influenza A by PCR NEGATIVE NEGATIVE Final   Influenza B by PCR NEGATIVE NEGATIVE Final    Comment: (NOTE) The Xpert Xpress SARS-CoV-2/FLU/RSV plus assay is intended as an aid in the diagnosis of influenza from Nasopharyngeal swab specimens and should not be used as a sole basis for treatment. Nasal washings and aspirates are unacceptable for Xpert Xpress SARS-CoV-2/FLU/RSV testing.  Fact Sheet for Patients: BloggerCourse.com  Fact Sheet for Healthcare Providers: SeriousBroker.it  This test is not yet approved or cleared by the [place] FDA and has been authorized for detection and/or diagnosis of SARS-CoV-2 by FDA under an Emergency Use Authorization (EUA). This EUA will remain in effect (meaning this test can be used) for the duration of the COVID-19 declaration under Section 564(b)(1) of the Act, 21 U.S.C. section 360bbb-3(b)(1), unless the authorization is terminated or revoked.  Performed at [facility], [address] [place], [place] [address]   Surgical pcr screen     Status: None   Collection Time: 03/15/21 12:41 AM   Specimen: Nasal Mucosa; Nasal Swab  Result Value Ref Range Status   MRSA, PCR NEGATIVE NEGATIVE Final   Staphylococcus aureus NEGATIVE NEGATIVE Final    Comment: (NOTE) The Xpert SA Assay (FDA approved for NASAL specimens in patients 22 years of age and older), is one component of a comprehensive surveillance program. It is not intended to diagnose infection nor to guide or monitor treatment. Performed at [facility], [address] [place], [place] [address]      Radiology Studies: DG Abd 1 View  Result Date: 03/17/2021 CLINICAL DATA:  Hypoxemia. Abdominal pain. Diabetes. Recent abdominal surgery. EXAM: ABDOMEN - 1 VIEW COMPARISON:  Radiographs and CT 03/12/2021. FINDINGS:  0849 hours. Two portable supine views are submitted. The visualized bowel gas pattern is nonobstructive. There is no supine evidence of free intraperitoneal air. New skin staples are present over the left lower abdominal wall. There are extensive postsurgical changes throughout the thoracolumbar spine. Nasogastric tube has been removed in the interval. IMPRESSION: Evidence of interval abdominal surgery. No acute abdominal findings identified radiographically. Electronically Signed   By: Carey Bullocks M.D.   On: 03/17/2021 10:33   DG Chest Port 1 View  Result Date: 03/17/2021 CLINICAL DATA:  Hypoxemia. EXAM: PORTABLE CHEST 1 VIEW COMPARISON:  03/13/2021 and 03/12/2021. FINDINGS: 0855 hours. Presumed new endotracheal tube projecting over the mid trachea. The  heart size and mediastinal contours are stable. There is stable elevation of the right hemidiaphragm with mild chronic right basilar atelectasis. The lungs are otherwise clear. There is no pleural effusion or pneumothorax. Cholecystectomy clips and prior thoracolumbar fusion are noted. IMPRESSION: Presumed interval intubation with satisfactory position of the endotracheal tube. No other significant changes. Electronically Signed   By: Carey Bullocks M.D.   On: 03/17/2021 10:30    Scheduled Meds: . Chlorhexidine Gluconate Cloth  6 each Topical Daily  . enoxaparin (LOVENOX) injection  40 mg Subcutaneous Q24H  . insulin aspart  0-9 Units Subcutaneous Q6H  . ketorolac  15 mg Intravenous Q6H  . pantoprazole (PROTONIX) IV  40 mg Intravenous Q12H   Continuous Infusions: . sodium chloride 75 mL/hr at 03/18/21 0420     LOS: 5 days   Shon Hale, MD How to contact the [facility] Attending or Consulting provider 7A - 7P or covering provider during after hours 7P -7A, for this patient?  1. Check the care team in [facility] and look for a) attending/consulting TRH provider listed and b) the [facility] team listed 2. Log into [email or web address] and use [place]'s  universal password to access. If you do not have the password, please contact the hospital operator. 3. Locate the [facility] provider you are looking for under [facility] and page to a number that you can be directly reached. 4. If you still have difficulty reaching the provider, please page the [facility] (Director on Call) for the Hospitalists listed on amion for assistance.  03/18/2021, 12:04 PM

## 2021-03-19 LAB — GLUCOSE, CAPILLARY
Glucose-Capillary: 179 mg/dL — ABNORMAL HIGH (ref 70–99)
Glucose-Capillary: 187 mg/dL — ABNORMAL HIGH (ref 70–99)
Glucose-Capillary: 170 mg/dL — ABNORMAL HIGH (ref 70–99)
Glucose-Capillary: 211 mg/dL — ABNORMAL HIGH (ref 70–99)

## 2021-03-19 MED ORDER — POLYETHYLENE GLYCOL 3350 17 G PO PACK
17.0000 g | PACK | Freq: Every day | ORAL | Status: DC
  Administered 2021-03-19 – 2021-03-21 (×3): 17 g via ORAL
  Filled 2021-03-19 (×3): qty 1

## 2021-03-19 NOTE — Progress Notes (Signed)
[facility]  Wound looking good, some SS drainage inferior but minor. ABD replaced. Binder replaced. Miralax today. On soft diet, maybe home tomorrow if having BM. Needs IS so can get off O2. Low grade fever likely from atelectasis.  [name], MD [facility] [address] [place], [place] [address] [phone] (office)

## 2021-03-19 NOTE — TOC Initial Note (Signed)
Transition of Care [facility]) - Initial/Assessment Note    Patient Details  Name: [name] MRN: [id number] Date of Birth: 08-28-1951  Transition [facility] Care [facility]) CM/SW Contact:    Barry Brunner, LCSW Phone Number: 03/19/2021, 4:47 PM  Clinical Narrative:                 [name] is a 70 year old female admitted for Intractable abdominal pain. CSW conducted initial assessment. Patient reported that she lives at home with her sister. Patient reported that prior to surgery, she was able to perform her ADL's independently. Patient is agreeable to SNF referrals sent to SNF[facility] between [facility] and [facility]. CSW stated auth and faxed patient to facilities. TOC to follow.   Expected Discharge Plan: Skilled Nursing Facility Barriers to Discharge: Continued Medical Work up   Patient Goals and CMS Choice Patient states their goals for this hospitalization and ongoing recovery are:: Return home with [facility] CMS Medicare.gov Compare Post Acute Care list provided to:: Patient Choice offered to / list presented to : Patient  Expected Discharge Plan and Services Expected Discharge Plan: Skilled Nursing Facility     Post Acute Care Choice: Skilled Nursing Facility Living arrangements for the past 2 months: Single Family Home                                      Prior Living Arrangements/Services Living arrangements for the past 2 months: Single Family Home Lives with:: Self,Siblings Patient language and need for interpreter reviewed:: Yes Do you feel safe going back to the place where you live?: Yes      Need for Family Participation in Patient Care: Yes (Comment) Care giver support system in place?: Yes (comment)   Criminal Activity/Legal Involvement Pertinent to Current Situation/Hospitalization: No - Comment as needed  Activities of Daily Living Home Assistive Devices/Equipment: Cane (specify quad or straight),Walker (specify type) ADL Screening (condition at time of  admission) Patient's cognitive ability adequate to safely complete daily activities?: Yes Is the patient deaf or have difficulty hearing?: No Does the patient have difficulty seeing, even when wearing glasses/contacts?: No Does the patient have difficulty concentrating, remembering, or making decisions?: No Patient able to express need for assistance with ADLs?: Yes Does the patient have difficulty dressing or bathing?: No Independently performs ADLs?: Yes (appropriate for developmental age) Does the patient have difficulty walking or climbing stairs?: No Weakness of Legs: None Weakness of Arms/Hands: None  Permission Sought/Granted   Permission granted to share information with : Yes, Verbal Permission Granted     Permission granted to share info w AGENCY: [facility]        Emotional Assessment     Affect (typically observed): Accepting,Adaptable,Appropriate Orientation: : Oriented to Self,Oriented to Place,Oriented to  Time,Oriented to Situation Alcohol / Substance Use: Not Applicable Psych Involvement: No (comment)  Admission diagnosis:  Intractable abdominal pain [R10.9] Non-intractable vomiting with nausea, unspecified vomiting type [R11.2] Abdominal pain [R10.9] Patient Active Problem List   Diagnosis Date Noted  . Non-intractable vomiting   . Abdominal pain 03/13/2021  . Intractable abdominal pain 03/12/2021  . Chest pain 02/14/2021  . Incisional hernia, without obstruction or gangrene 12/08/2020  . Malrotation of intestine 12/08/2020  . GERD (gastroesophageal reflux disease) 09/22/2020  . Gastritis 09/19/2020  . Other chest pain 09/19/2020  . Reducible bulge of abdominal wall 09/19/2020  . Obesity 02/23/2020  . Chest pain  with moderate risk for cardiac etiology 02/22/2020  . Dyspnea on exertion   . Carotid artery stenosis 01/07/2020  . Constipation 09/03/2019  . Dysphagia 08/11/2018  . Leg swelling 08/11/2018  . Bilateral carotid bruits 08/11/2018   . Abnormal EKG 08/06/2018  . Acute blood loss as cause of postoperative anemia 03/07/2018    Class: Acute  . Spinal stenosis, lumbar region with neurogenic claudication 03/04/2018    Class: Chronic  . Multilevel degenerative disc disease 03/04/2018    Class: Chronic  . Forestier's disease of thoracolumbar region 03/04/2018    Class: Chronic  . Fusion of spine of thoracolumbar region 03/04/2018  . Pain in right hip 03/18/2017  . Stiffness of joint, not elsewhere classified, other specified site 08/04/2013  . Leg weakness, bilateral 08/04/2013  . Chest pain at rest 05/10/2009  . BENIGN POSITIONAL VERTIGO 07/30/2007  . VARICOSE VEINS, LOWER EXTREMITIES 07/30/2007  . SCIATICA, CHRONIC 07/30/2007  . Diabetes (HCC) 11/18/2006  . Hyperlipidemia 11/18/2006  . MIGRAINE HEADACHE 11/18/2006  . LESION, SCIATIC NERVE 11/18/2006  . Essential hypertension 11/18/2006  . HYPOTENSION, ORTHOSTATIC 11/18/2006  . BRONCHITIS NOS 11/18/2006  . Other emphysema (HCC) 11/18/2006  . ASTHMA 11/18/2006  . OSTEOARTHRITIS 11/18/2006  . LOW BACK PAIN 11/18/2006  . VERTIGO 11/18/2006   PCP:  Mirna Mires, MD Pharmacy:   [facility] [address] [place] [address] [place] [address] [place] [address] [place] [address] Phone: [phone] Fax: [phone]     Social Determinants of Health (SDOH) Interventions    Readmission Risk Interventions No flowsheet data found.

## 2021-03-19 NOTE — [place] FL2 (Signed)
[place] MEDICAID FL2 LEVEL OF CARE SCREENING TOOL     IDENTIFICATION  Patient Name: [name] Birthdate: 04-28-1951 Sex: female Admission Date (Current Location): 03/12/2021  [facility] [place] and [place] Number:  [employer] and Address:  [facility] [address],  [address] [place] [address]      Provider Number: [id number]  Attending Physician Name and Address:  Shon Hale, MD  Relative Name and Phone Number:  [place], Flamm (Sister)   [phone]    Current Level of Care: Hospital Recommended Level of Care: Skilled Nursing Facility Prior Approval Number:    Date Approved/Denied: 03/05/18 PASRR Number: [id number]  Discharge Plan: SNF    Current Diagnoses: Patient Active Problem List   Diagnosis Date Noted  . Non-intractable vomiting   . Abdominal pain 03/13/2021  . Intractable abdominal pain 03/12/2021  . Chest pain 02/14/2021  . Incisional hernia, without obstruction or gangrene 12/08/2020  . Malrotation of intestine 12/08/2020  . GERD (gastroesophageal reflux disease) 09/22/2020  . Gastritis 09/19/2020  . Other chest pain 09/19/2020  . Reducible bulge of abdominal wall 09/19/2020  . Obesity 02/23/2020  . Chest pain with moderate risk for cardiac etiology 02/22/2020  . Dyspnea on exertion   . Carotid artery stenosis 01/07/2020  . Constipation 09/03/2019  . Dysphagia 08/11/2018  . Leg swelling 08/11/2018  . Bilateral carotid bruits 08/11/2018  . Abnormal EKG 08/06/2018  . Acute blood loss as cause of postoperative anemia 03/07/2018  . Spinal stenosis, lumbar region with neurogenic claudication 03/04/2018  . Multilevel degenerative disc disease 03/04/2018  . Forestier's disease of thoracolumbar region 03/04/2018  . Fusion of spine of thoracolumbar region 03/04/2018  . Pain in right hip 03/18/2017  . Stiffness of joint, not elsewhere classified, other specified site 08/04/2013  . Leg weakness, bilateral 08/04/2013  .  Chest pain at rest 05/10/2009  . BENIGN POSITIONAL VERTIGO 07/30/2007  . VARICOSE VEINS, LOWER EXTREMITIES 07/30/2007  . SCIATICA, CHRONIC 07/30/2007  . Diabetes (HCC) 11/18/2006  . Hyperlipidemia 11/18/2006  . MIGRAINE HEADACHE 11/18/2006  . LESION, SCIATIC NERVE 11/18/2006  . Essential hypertension 11/18/2006  . HYPOTENSION, ORTHOSTATIC 11/18/2006  . BRONCHITIS NOS 11/18/2006  . Other emphysema (HCC) 11/18/2006  . ASTHMA 11/18/2006  . OSTEOARTHRITIS 11/18/2006  . LOW BACK PAIN 11/18/2006  . VERTIGO 11/18/2006    Orientation RESPIRATION BLADDER Height & Weight     Self,Time,Situation,Place  Normal Continent Weight: 189 lb (85.7 kg) Height:  5\' 5"  (165.1 cm)  BEHAVIORAL SYMPTOMS/MOOD NEUROLOGICAL BOWEL NUTRITION STATUS      Continent Diet (DIET SOFT Room service appropriate? Yes; Fluid consistency: Thin)  AMBULATORY STATUS COMMUNICATION OF NEEDS Skin   Extensive Assist Verbally Surgical wounds (Abdominal incision)                       Personal Care Assistance Level of Assistance  Bathing,Feeding,Dressing Bathing Assistance: Maximum assistance   Dressing Assistance: Maximum assistance     Functional Limitations Info  Sight,Hearing,Speech Sight Info: Adequate Hearing Info: Adequate Speech Info: Adequate    SPECIAL CARE FACTORS FREQUENCY  PT (By licensed PT)     PT Frequency: 5x              Contractures Contractures Info: Not present    Additional Factors Info  Code Status,Allergies Code Status Info: DNR Allergies Info: N/A           Current Medications (03/19/2021):  This is the current hospital active medication list Current Facility-Administered Medications  Medication Dose  Route Frequency Provider Last Rate Last Admin  . 0.9 %  sodium chloride infusion   Intravenous Continuous Lucretia Roers, MD 75 mL/hr at 03/19/21 0227 Infusion Verify at 03/19/21 0227  . acetaminophen (TYLENOL) tablet 650 mg  650 mg Oral Q6H PRN Lucretia Roers, MD    650 mg at 03/16/21 2044   Or  . acetaminophen (TYLENOL) suppository 650 mg  650 mg Rectal Q6H PRN Lucretia Roers, MD      . Chlorhexidine Gluconate Cloth 2 % PADS 6 each  6 each Topical Daily Shon Hale, MD   6 each at 03/19/21 1455  . diphenhydrAMINE (BENADRYL) injection 25 mg  25 mg Intravenous Q8H PRN Mariea Clonts, Courage, MD   25 mg at 03/18/21 0415  . docusate sodium (COLACE) capsule 100 mg  100 mg Oral BID Lucretia Roers, MD   100 mg at 03/19/21 1015  . enoxaparin (LOVENOX) injection 40 mg  40 mg Subcutaneous Q24H Lucretia Roers, MD   40 mg at 03/19/21 1015  . insulin aspart (novoLOG) injection 0-9 Units  0-9 Units Subcutaneous Q6H Lucretia Roers, MD   2 Units at 03/19/21 1000  . ketorolac (TORADOL) 30 MG/ML injection 15 mg  15 mg Intravenous Q6H Emokpae, Courage, MD   15 mg at 03/19/21 1134  . labetalol (NORMODYNE) injection 10 mg  10 mg Intravenous Q2H PRN Lucretia Roers, MD      . morphine 2 MG/ML injection 2 mg  2 mg Intravenous Q3H PRN Lucretia Roers, MD      . ondansetron [facility]) tablet 4 mg  4 mg Oral Q6H PRN Lucretia Roers, MD       Or  . ondansetron [facility]) injection 4 mg  4 mg Intravenous Q6H PRN Lucretia Roers, MD      . oxyCODONE (Oxy IR/ROXICODONE) immediate release tablet 5 mg  5 mg Oral Q4H PRN Lucretia Roers, MD   5 mg at 03/18/21 2146  . pantoprazole (PROTONIX) injection 40 mg  40 mg Intravenous Q12H Lucretia Roers, MD   40 mg at 03/19/21 1015  . polyethylene glycol (MIRALAX / GLYCOLAX) packet 17 g  17 g Oral Daily Lucretia Roers, MD         Discharge Medications: Please see discharge summary for a list of discharge medications.  Relevant Imaging Results:  Relevant Lab Results:   Additional Information Pt SSN: [id number]  Barry Brunner, LCSW

## 2021-03-19 NOTE — Progress Notes (Signed)
PROGRESS NOTE   [name]  ZOX:[id number] DOB: [birth year] DOA: 03/12/2021 PCP: Mirna Mires, MD   Chief Complaint  Patient presents with  . Chest Pain   Level of care: Med-Surg  Brief Admission History:   70 y.o. female with medical history significant for diabetes mellitus, hypertension, asthma.  Patient presented to the ED with complaints of abdominal pain, chest pain, vomiting and diarrhea.  She reports midsternal chest pain has been ongoing since discharge from the hospital, she was recently admitted for same, cardiology was consulted, symptoms were thought not consistent with cardiac etiology, her calculated coronary calcium score was 0 by coronary CT 05/2019, GI evaluation was recommended.  She describes burning midsternal chest pain worse when she vomits.  She reports onset of vomiting today, she has had 6 episodes of vomiting today, no blood.  She also reports 6 episodes of bloody loose stools today.  She reports associated abdominal pain right lower abdomen, and bloating and intermittent burning pain with urination.  Assessment & Plan:   Principal Problem:   Intractable abdominal pain Active Problems:   Diabetes (HCC)   Essential hypertension   Chest pain at rest   Incisional hernia, without obstruction or gangrene   Malrotation of intestine   Abdominal pain   Non-intractable vomiting   Abdominal pain - on 03/15/21 patient underwent  Exploratory laparotomy, lysis of adhesions/reduction of small bowel to the right side of the abdomen and colon to the left side of abdomen (Ladd's procedure), incisional hernia repair with mesh (Ventralight ST Mesh 20.3X 25.4cm)  -Passing gas, NG tube out, tolerating diet   Atypical chest pain -her symptoms are suggesting uncontrolled gastritis, continue Protonix and appreciate GI consultation  Type 2 diabetes mellitus with neurological complications -  -A1c is 9.6 reflecting uncontrolled DM with hyperglycemia PTA Use Novolog/Humalog  Sliding scale insulin with Accu-Cheks/Fingersticks as ordered  Leukocytosis - reactive, resolved now with normal WBC.   FEN--- okay to taper IV fluids as patient is tolerating oral intake better  -Acute anemia due to acute perioperative blood loss --hemoglobin currently greater than 10, transfuse as clinically indicated  Generalized weakness and ambulatory dysfunction--- PT recommends SNF rehab -Awaiting insurance approval for transfer to SNF rehab -  DVT prophylaxis: enoxaparin  Code Status: full  Family Communication: pt and sister Disposition: home  Status is: Inpatient  Remains inpatient appropriate because:Awaiting insurance approval for transfer to SNF rehab   Dispo: The patient is from: Home              Anticipated d/c is to: Home              Patient currently is medically stable to d/c.   Difficult to place patient No   Consultants:   GI   Surgery   Procedures:   NG placement  -Exploratory laparotomy on 03/15/2021 Antimicrobials:  Ceftriaxone stopped 5/30 metronidazole stopped 5/30  Subjective: -Feeling better, tolerating oral intake better, passing gas,  Objective: Vitals:   03/18/21 1314 03/18/21 1925 03/19/21 0414 03/19/21 1425  BP: (!) 141/63 (!) 141/67 112/73 (!) 142/65  Pulse: 84 82 97 79  Resp: 18 15 16 20   Temp: 98.1 F (36.7 C) 98.7 F (37.1 C) 99.2 F (37.3 C) 98.2 F (36.8 C)  TempSrc: Oral Oral Oral Oral  SpO2: 100% 100% 100% 92%  Weight:      Height:        Intake/Output Summary (Last 24 hours) at 03/19/2021 1759 Last data filed at 03/19/2021 1300 Gross per  24 hour  Intake 2753.3 ml  Output --  Net 2753.3 ml   Filed Weights   03/12/21 1124  Weight: 85.7 kg    Examination:  General exam: chronically ill appearing, In no apparent distress Nose- Watkins 2L/min Respiratory system: Mostly clear, air movement is fair and symmetrical  cardiovascular system: normal S1 & S2 heard. RRR Gastrointestinal system: Diminished bowel sounds,  appropriate postop tenderness, postop wound mostly intact Central nervous system: Alert and oriented.  Generalized weakness without new focal neurological deficits. Extremities: Symmetric 5 x 5 power. Skin: No rashes, lesions or ulcers.   Psychiatry: Judgement and insight appear normal. Mood & affect appropriate.   Data Reviewed: I have personally reviewed following labs and imaging studies  CBC: Recent Labs  Lab 03/13/21 0611 03/14/21 0623 03/16/21 0531 03/18/21 0616  WBC 10.7* 7.4 10.3 9.0  NEUTROABS  --  3.8 8.1* 6.7  HGB 12.5 12.0 11.9* 10.4*  HCT 40.0 38.7 37.7 33.6*  MCV 95.0 97.0 94.7 97.4  PLT 231 211 239 196    Basic Metabolic Panel: Recent Labs  Lab 03/14/21 0623 03/15/21 0524 03/16/21 0531 03/17/21 0545 03/18/21 0616  NA 138 135 137 138  138 139  K 3.7 3.6 3.8 4.1  4.1 4.2  CL 103 100 103 105  105 105  CO2 28 29 27 25  26 28   GLUCOSE 150* 184* 171* 176*  176* 171*  BUN 10 8 10 12  12 13   CREATININE 0.75 0.78 0.86 0.83  0.85 0.79  CALCIUM 8.0* 8.3* 8.0* 7.9*  7.9* 8.1*  MG 1.5*  --  1.6*  --   --   PHOS  --   --  2.3* 2.0*  --     GFR: Estimated Creatinine Clearance: 70.8 mL/min (by C-G formula based on SCr of 0.79 mg/dL).  Liver Function Tests: Recent Labs  Lab 03/14/21 0623 03/15/21 0524 03/16/21 0531 03/17/21 0545 03/18/21 0616  AST 33 26 27 19 28   ALT 30 29 29 25  32  ALKPHOS 46 49 42 41 41  BILITOT 0.8 0.5 0.9 1.3* 0.9  PROT 5.8* 6.5 5.6* 5.8* 5.8*  ALBUMIN 3.1* 3.5 3.0* 2.8*  2.9* 2.7*    CBG: Recent Labs  Lab 03/18/21 1629 03/18/21 2121 03/19/21 0727 03/19/21 1152 03/19/21 1710  GLUCAP 165* 172* 179* 187* 170*    Recent Results (from the past 240 hour(s))  Culture, Urine     Status: Abnormal   Collection Time: 03/12/21  3:21 PM   Specimen: Urine, Clean Catch  Result Value Ref Range Status   Specimen Description   Final    URINE, CLEAN CATCH Performed at [facility], [address] [place], [place] [address]     Special Requests   Final    NONE Performed at [facility], [address] [place], [place] [address]    Culture MULTIPLE SPECIES PRESENT, SUGGEST RECOLLECTION (A)  Final   Report Status 03/14/2021 FINAL  Final  Resp Panel by RT-PCR (Flu A&B, Covid) Nasopharyngeal Swab     Status: None   Collection Time: 03/12/21  3:56 PM   Specimen: Nasopharyngeal Swab; Nasopharyngeal(NP) swabs in vial transport medium  Result Value Ref Range Status   SARS Coronavirus 2 by RT PCR NEGATIVE NEGATIVE Final    Comment: (NOTE) SARS-CoV-2 target nucleic acids are NOT DETECTED.  The SARS-CoV-2 RNA is generally detectable in upper respiratory specimens during the acute phase of infection. The lowest concentration of SARS-CoV-2 viral copies this assay can detect is  138 copies/mL. A negative result does not preclude SARS-Cov-2 infection and should not be used as the sole basis for treatment or other patient management decisions. A negative result may occur with  improper specimen collection/handling, submission of specimen other than nasopharyngeal swab, presence of viral mutation(s) within the areas targeted by this assay, and inadequate number of viral copies(<138 copies/mL). A negative result must be combined with clinical observations, patient history, and epidemiological information. The expected result is Negative.  Fact Sheet for Patients:  BloggerCourse.com  Fact Sheet for Healthcare Providers:  SeriousBroker.it  This test is no t yet approved or cleared by the [place] FDA and  has been authorized for detection and/or diagnosis of SARS-CoV-2 by FDA under an Emergency Use Authorization (EUA). This EUA will remain  in effect (meaning this test can be used) for the duration of the COVID-19 declaration under Section 564(b)(1) of the Act, 21 U.S.C.section 360bbb-3(b)(1), unless the authorization is terminated  or revoked sooner.        Influenza A by PCR NEGATIVE NEGATIVE Final   Influenza B by PCR NEGATIVE NEGATIVE Final    Comment: (NOTE) The Xpert Xpress SARS-CoV-2/FLU/RSV plus assay is intended as an aid in the diagnosis of influenza from Nasopharyngeal swab specimens and should not be used as a sole basis for treatment. Nasal washings and aspirates are unacceptable for Xpert Xpress SARS-CoV-2/FLU/RSV testing.  Fact Sheet for Patients: BloggerCourse.com  Fact Sheet for Healthcare Providers: SeriousBroker.it  This test is not yet approved or cleared by the [place] FDA and has been authorized for detection and/or diagnosis of SARS-CoV-2 by FDA under an Emergency Use Authorization (EUA). This EUA will remain in effect (meaning this test can be used) for the duration of the COVID-19 declaration under Section 564(b)(1) of the Act, 21 U.S.C. section 360bbb-3(b)(1), unless the authorization is terminated or revoked.  Performed at [facility], [address] [place], [place] [address]   Surgical pcr screen     Status: None   Collection Time: 03/15/21 12:41 AM   Specimen: Nasal Mucosa; Nasal Swab  Result Value Ref Range Status   MRSA, PCR NEGATIVE NEGATIVE Final   Staphylococcus aureus NEGATIVE NEGATIVE Final    Comment: (NOTE) The Xpert SA Assay (FDA approved for NASAL specimens in patients 8 years of age and older), is one component of a comprehensive surveillance program. It is not intended to diagnose infection nor to guide or monitor treatment. Performed at [facility], [address], [place], [place] [address]      Radiology Studies: No results found.  Scheduled Meds: . Chlorhexidine Gluconate Cloth  6 each Topical Daily  . docusate sodium  100 mg Oral BID  . enoxaparin (LOVENOX) injection  40 mg Subcutaneous Q24H  . insulin aspart  0-9 Units Subcutaneous Q6H  . ketorolac  15 mg Intravenous Q6H  . pantoprazole (PROTONIX) IV  40 mg  Intravenous Q12H  . polyethylene glycol  17 g Oral Daily   Continuous Infusions: . sodium chloride 75 mL/hr at 03/19/21 0227     LOS: 6 days   Shon Hale, MD How to contact the [facility] Attending or Consulting provider 7A - 7P or covering provider during after hours 7P -7A, for this patient?  1. Check the care team in [facility] and look for a) attending/consulting TR[facility] provider listed and b) the [facility] team listed 2. Log into [email or web address] and use [facility] universal password to access. If you do not have the password, please contact the hospital operator.  3. Locate the [facility] provider you are looking for under [facility] and page to a number that you can be directly reached. 4. If you still have difficulty reaching the provider, please page the [facility] (Director on Call) for the Hospitalists listed on amion for assistance.  03/19/2021, 5:59 PM

## 2021-03-20 DIAGNOSIS — K432 Incisional hernia without obstruction or gangrene: Secondary | ICD-10-CM

## 2021-03-20 LAB — BASIC METABOLIC PANEL
Anion gap: 6 (ref 5–15)
BUN: 12 mg/dL (ref 8–23)
CO2: 29 mmol/L (ref 22–32)
Calcium: 8.4 mg/dL — ABNORMAL LOW (ref 8.9–10.3)
Chloride: 105 mmol/L (ref 98–111)
Creatinine, Ser: 0.7 mg/dL (ref 0.44–1.00)
GFR, Estimated: >60 mL/min (ref >60)
Glucose, Bld: 187 mg/dL — ABNORMAL HIGH (ref 70–99)
Potassium: 3.9 mmol/L (ref 3.5–5.1)
Sodium: 140 mmol/L (ref 135–145)

## 2021-03-20 LAB — CBC
HCT: 32 % — ABNORMAL LOW (ref 36.0–46.0)
Hemoglobin: 9.8 g/dL — ABNORMAL LOW (ref 12.0–15.0)
MCH: 29.8 pg (ref 26.0–34.0)
MCHC: 30.6 g/dL (ref 30.0–36.0)
MCV: 97.3 fL (ref 80.0–100.0)
Platelets: 230 10*3/uL (ref 150–400)
RBC: 3.29 MIL/uL — ABNORMAL LOW (ref 3.87–5.11)
RDW: 14.3 % (ref 11.5–15.5)
WBC: 8.2 10*3/uL (ref 4.0–10.5)
nRBC: 0 % (ref 0.0–0.2)

## 2021-03-20 LAB — RESP PANEL BY RT-PCR (FLU A&B, COVID) ARPGX2
Influenza A by PCR: NEGATIVE
Influenza B by PCR: NEGATIVE
SARS Coronavirus 2 by RT PCR: NEGATIVE

## 2021-03-20 LAB — GLUCOSE, CAPILLARY
Glucose-Capillary: 174 mg/dL — ABNORMAL HIGH (ref 70–99)
Glucose-Capillary: 183 mg/dL — ABNORMAL HIGH (ref 70–99)
Glucose-Capillary: 201 mg/dL — ABNORMAL HIGH (ref 70–99)
Glucose-Capillary: 190 mg/dL — ABNORMAL HIGH (ref 70–99)

## 2021-03-20 NOTE — Discharge Instructions (Signed)
Discharge Open Abdominal Surgery Instructions:  Common Complaints: Pain at the incision site is common. This will improve with time. Take your pain medications as described below. Some nausea is common and poor appetite. The main goal is to stay hydrated the first few days after surgery.   Diet/ Activity: Diet as tolerated.  Pack wound with saline dampened gauze on the inferior part and cover the entire wound with ABD and papertape. Seroma was evacuated in this area.  Wear an abdominal binder daily with activity. You do not have to wear this while sleeping or sitting.  Rest and listen to your body, but do not remain in bed all day.  Walk everyday for at least 15-20 minutes. Deep cough and move around every 1-2 hours in the first few days after surgery.  Do not lift > 10 lbs, perform excessive bending, pushing, pulling, squatting for 6-8 weeks after surgery.  The activity restrictions and the abdominal binder are to prevent hernia formation at your incision while you are healing.  Do not place lotions or balms on your incision unless instructed to specifically by Dr. Henreitta Leber.    Contact Information: If you have questions or concerns, please call our office, [phone], Monday- Thursday 8AM-5PM and Friday 8AM-12Noon.  If it is after hours or on the weekend, please call [facility] Main Number, [phone], [phone], and ask to speak to the surgeon on call for Dr. Henreitta Leber at [facility].    Open Hernia Repair, Adult, Care After The following information offers guidance on how to care for yourself after your procedure. Your health care provider may also give you more specific instructions. If you have problems or questions, contact your health care provider. What can I expect after the procedure? After the procedure, it is common to have:  Mild discomfort.  Slight bruising.  Minor swelling.  Pain in the abdomen.  A small amount of blood from the incision. Follow these instructions at  home: Medicines  Take over-the-counter and prescription medicines only as told by your health care provider.  Ask your health care provider if the medicine prescribed to you: ? Requires you to avoid driving or using machinery. ? Can cause constipation. You may need to take these actions to prevent or treat constipation:  Drink enough fluid to keep your urine pale yellow.  Take over-the-counter or prescription medicines.  Eat foods that are high in fiber, such as beans, whole grains, and fresh fruits and vegetables.  Limit foods that are high in fat and processed sugars, such as fried or sweet foods.  If you were prescribed an antibiotic medicine, take it as told by your health care provider. Do not stop using the antibiotic even if you start to feel better. Incision care  Follow instructions from your health care provider about how to take care of your incision. Make sure you: ? Wash your hands with soap and water for at least 20 seconds before and after you change your bandage (dressing). If soap and water are not available, use hand sanitizer. ? Change your dressing as told by your health care provider. ? Leave stitches (sutures), skin glue, or adhesive strips in place. These skin closures may need to stay in place for 2 weeks or longer. If adhesive strip edges start to loosen and curl up, you may trim the loose edges. Do not remove adhesive strips completely unless your health care provider tells you to do that.  Check your incision area every day for signs of infection. Check  for: ? More redness, swelling, or pain. ? More fluid or blood. ? Warmth. ? Pus or a bad smell.  Wear loose, soft clothing while your incision heals.   Activity  Rest as told by your health care provider.  Do not lift anything that is heavier than 10 lb (4.5 kg), or the limit that you are told, until your health care provider says that it is safe.  Do not play contact sports until your health care  provider says that this is safe.  If you were given a sedative during the procedure, it can affect you for several hours. Do not drive or operate machinery until your health care provider says that it is safe.  Return to your normal activities as told by your health care provider. Ask your health care provider what activities are safe for you.   General instructions  Do not take baths, swim, or use a hot tub until your health care provider approves. You may shower.   Hold a pillow over your abdomen when you cough or sneeze. This helps with pain.  Do not use any products that contain nicotine or tobacco. These products include cigarettes, chewing tobacco, and vaping devices, such as e-cigarettes. If you need help quitting, ask your health care provider.  Keep all follow-up visits. This is important. Contact a health care provider if:  You have any of these signs of infection: ? More redness, swelling, or pain around your incision. ? More fluid or blood coming from your incision. ? Warmth coming from your incision. ? Pus or a bad smell coming from your incision. ? A fever or chills.  You have blood in your stool (feces).  You have not had a bowel movement in 2-3 days.  Your pain is not controlled with medicine. Get help right away if:  You have chest pain or shortness of breath.  You feel faint or light-headed.  You have severe pain.  You vomit and your pain is worse.  You have pain, swelling, or redness in a leg. These symptoms may represent a serious problem that is an emergency. Do not wait to see if the symptoms will go away. Get medical help right away. Call your local emergency services (911 in the [place] Do not drive yourself to the hospital. Summary  After an open hernia repair, it is common to have mild discomfort, slight bruising, and minor swelling.  Follow instructions from your health care provider about how to take care of your incision. Check every day for  signs of infection.  Do not lift heavy objects or play contact sports until your health care provider says it is safe.  Return to your normal activities as told by your health care provider. Ask your health care provider what activities are safe for you. This information is not intended to replace advice given to you by your health care provider. Make sure you discuss any questions you have with your health care provider. Document Revised: 05/16/2020 Document Reviewed: 05/16/2020 Elsevier Patient Education  2021 [employer].

## 2021-03-20 NOTE — Progress Notes (Signed)
[facility]  Bottom of wound opened and seroma /hematoma evacuated. Saline dampened gauze to the inferior portion of wound and cover with ABD and papertape.  Diet as tolerated. Chronic constipation. Having flatus. Continue bowel regimen.   Will see 6/14 to check wound and get staples out. No heavy lifting > 10 lbs, excessive bending, pushing, pulling, or squatting for 6-8 weeks after surgery.   [name], MD [facility] [address] [place], [place] [address] [phone] (office)

## 2021-03-20 NOTE — TOC Progression Note (Addendum)
Transition of Care [facility]) - Progression Note    Patient Details  Name: [name] MRN: [id number] Date of Birth: Oct 01, 1951  Transition of [facility] CM/SW Contact  Leitha Bleak, RN Phone Number: 03/20/2021, 3:42 PM  Clinical Narrative:   Patient and sister accepted the bed offer from [facility].  Firefighter received. Patient is vaccinated for COVID times 3.  DC plan for patient is tomorrow. COVID test ordered.      Expected Discharge Plan: Skilled Nursing Facility Barriers to Discharge: Continued Medical Work up  Expected Discharge Plan and Services Expected Discharge Plan: Skilled Nursing Facility     Post Acute Care Choice: Skilled Nursing Facility Living arrangements for the past 2 months: Single Family Home Expected Discharge Date: 03/20/21                  Readmission Risk Interventions Readmission Risk Prevention Plan 03/20/2021  Post Dischage Appt Complete  Medication Screening Complete  Transportation Screening Complete  Some recent data might be hidden

## 2021-03-20 NOTE — Progress Notes (Signed)
Per MD Henreitta Leber, patients abdominal dressing changed, packed with wet gauze and covered with abd pad and surgical tape. Clean abdominal binder put on as well.

## 2021-03-20 NOTE — Care Management Important Message (Signed)
Important Message  Patient Details  Name: [name] MRN: [id number] Date of Birth: 06-13-1951   Medicare Important Message Given:  Yes     Corey Harold 03/20/2021, 1:25 PM

## 2021-03-20 NOTE — Progress Notes (Addendum)
PROGRESS NOTE   [name]  RFX:[id number] DOB: 06/10/1951 DOA: 03/12/2021 PCP: Mirna Mires, MD   Chief Complaint  Patient presents with  . Chest Pain   Level of care: Med-Surg  Brief Admission History:   70 y.o. female with medical history significant for diabetes mellitus, hypertension, asthma.  Patient presented to the ED with complaints of abdominal pain, chest pain, vomiting and diarrhea.  She reports midsternal chest pain has been ongoing since discharge from the hospital, she was recently admitted for same, cardiology was consulted, symptoms were thought not consistent with cardiac etiology, her calculated coronary calcium score was 0 by coronary CT 05/2019, GI evaluation was recommended.  She describes burning midsternal chest pain worse when she vomits.  She reports onset of vomiting today, she has had 6 episodes of vomiting today, no blood.  She also reports 6 episodes of bloody loose stools today.  She reports associated abdominal pain right lower abdomen, and bloating and intermittent burning pain with urination.  Assessment & Plan:   Principal Problem:   Intractable abdominal pain Active Problems:   Diabetes (HCC)   Essential hypertension   Chest pain at rest   Incisional hernia, without obstruction or gangrene   Malrotation of intestine   Abdominal pain   Non-intractable vomiting   Abdominal pain - on 03/15/21 patient underwent  Exploratory laparotomy, lysis of adhesions/reduction of small bowel to the right side of the abdomen and colon to the left side of abdomen (Ladd's procedure), incisional hernia repair with mesh (Ventralight ST Mesh 20.3X 25.4cm)  -Passing gas, NG tube out, tolerating diet   Atypical chest pain -her symptoms are suggesting uncontrolled gastritis, continue Protonix and appreciate GI consultation  Type 2 diabetes mellitus with neurological complications -  -A1c is 9.6 reflecting uncontrolled DM with hyperglycemia PTA Use Novolog/Humalog  Sliding scale insulin with Accu-Cheks/Fingersticks as ordered  Leukocytosis - reactive, resolved now with normal WBC.   FEN--- okay to taper IV fluids as patient is tolerating oral intake better  -Acute anemia due to acute perioperative blood loss --hemoglobin currently greater than 10, transfuse as clinically indicated  Generalized weakness and ambulatory dysfunction--- PT recommends SNF rehab -Awaiting insurance approval for transfer to SNF rehab - --Hypoxia resolved  DVT prophylaxis: enoxaparin  Code Status: full  Family Communication: pt and sister Disposition: home  Status is: Inpatient  Remains inpatient appropriate because:Awaiting insurance approval for transfer to SNF rehab   Dispo: The patient is from: Home              Anticipated d/c is to: SNF 03/21/21              Patient currently is medically stable to d/c.   Difficult to place patient No   Consultants:   GI   Surgery   Procedures:   NG placement  -Exploratory laparotomy on 03/15/2021 Antimicrobials:  Ceftriaxone stopped 5/30 metronidazole stopped 5/30  Subjective: -Feeling better, tolerating oral intake better, passing gas, -Anticipate discharge to SNF rehab on 03/21/2021  Objective: Vitals:   03/19/21 1425 03/19/21 2022 03/20/21 0439 03/20/21 1324  BP: (!) 142/65 (!) 148/66 (!) 141/68 (!) 171/81  Pulse: 79 85 76 81  Resp: 20 16 13 17   Temp: 98.2 F (36.8 C) 98.9 F (37.2 C) 98.2 F (36.8 C) 99.7 F (37.6 C)  TempSrc: Oral Oral Oral Oral  SpO2: 92% 93% 92% 95%  Weight:      Height:        Intake/Output Summary (Last 24 hours)  at 03/20/2021 1951 Last data filed at 03/20/2021 1809 Gross per 24 hour  Intake 660 ml  Output --  Net 660 ml   Filed Weights   03/12/21 1124  Weight: 85.7 kg    Examination:  General exam: chronically ill appearing, In no apparent distress Respiratory system: Mostly clear, air movement is fair and symmetrical  cardiovascular system: normal S1 & S2 heard.  RRR Gastrointestinal system: Diminished bowel sounds, appropriate postop tenderness, postop wound mostly intact Central nervous system: Alert and oriented.  Generalized weakness without new focal neurological deficits. Extremities: Symmetric 5 x 5 power. Skin: No rashes, lesions or ulcers.   Psychiatry: Judgement and insight appear normal. Mood & affect appropriate.   Data Reviewed: I have personally reviewed following labs and imaging studies  CBC: Recent Labs  Lab 03/14/21 0623 03/16/21 0531 03/18/21 0616 03/20/21 0452  WBC 7.4 10.3 9.0 8.2  NEUTROABS 3.8 8.1* 6.7  --   HGB 12.0 11.9* 10.4* 9.8*  HCT 38.7 37.7 33.6* 32.0*  MCV 97.0 94.7 97.4 97.3  PLT 211 239 196 230    Basic Metabolic Panel: Recent Labs  Lab 03/14/21 0623 03/15/21 0524 03/16/21 0531 03/17/21 0545 03/18/21 0616 03/20/21 0452  NA 138 135 137 138  138 139 140  K 3.7 3.6 3.8 4.1  4.1 4.2 3.9  CL 103 100 103 105  105 105 105  CO2 28 29 27 25  26 28 29   GLUCOSE 150* 184* 171* 176*  176* 171* 187*  BUN 10 8 10 12  12 13 12   CREATININE 0.75 0.78 0.86 0.83  0.85 0.79 0.70  CALCIUM 8.0* 8.3* 8.0* 7.9*  7.9* 8.1* 8.4*  MG 1.5*  --  1.6*  --   --   --   PHOS  --   --  2.3* 2.0*  --   --     GFR: Estimated Creatinine Clearance: 70.8 mL/min (by C-G formula based on SCr of 0.7 mg/dL).  Liver Function Tests: Recent Labs  Lab 03/14/21 0623 03/15/21 0524 03/16/21 0531 03/17/21 0545 03/18/21 0616  AST 33 26 27 19 28   ALT 30 29 29 25  32  ALKPHOS 46 49 42 41 41  BILITOT 0.8 0.5 0.9 1.3* 0.9  PROT 5.8* 6.5 5.6* 5.8* 5.8*  ALBUMIN 3.1* 3.5 3.0* 2.8*  2.9* 2.7*    CBG: Recent Labs  Lab 03/19/21 1710 03/19/21 2031 03/20/21 0714 03/20/21 1124 03/20/21 1628  GLUCAP 170* 211* 174* 183* 201*    Recent Results (from the past 240 hour(s))  Culture, Urine     Status: Abnormal   Collection Time: 03/12/21  3:21 PM   Specimen: Urine, Clean Catch  Result Value Ref Range Status   Specimen  Description   Final    URINE, CLEAN CATCH Performed at [facility], [address] [place], 03/14/21 [facility]    Special Requests   Final    NONE Performed at [facility], [address] [place], [address] [facility]    Culture MULTIPLE SPECIES PRESENT, SUGGEST RECOLLECTION (A)  Final   Report Status 03/14/2021 FINAL  Final  Resp Panel by RT-PCR (Flu A&B, Covid) Nasopharyngeal Swab     Status: None   Collection Time: 03/12/21  3:56 PM   Specimen: Nasopharyngeal Swab; Nasopharyngeal(NP) swabs in vial transport medium  Result Value Ref Range Status   SARS Coronavirus 2 by RT PCR NEGATIVE NEGATIVE Final    Comment: (NOTE) SARS-CoV-2 target nucleic acids are NOT DETECTED.  The SARS-CoV-2 RNA is  generally detectable in upper respiratory specimens during the acute phase of infection. The lowest concentration of SARS-CoV-2 viral copies this assay can detect is 138 copies/mL. A negative result does not preclude SARS-Cov-2 infection and should not be used as the sole basis for treatment or other patient management decisions. A negative result may occur with  improper specimen collection/handling, submission of specimen other than nasopharyngeal swab, presence of viral mutation(s) within the areas targeted by this assay, and inadequate number of viral copies(<138 copies/mL). A negative result must be combined with clinical observations, patient history, and epidemiological information. The expected result is Negative.  Fact Sheet for Patients:  BloggerCourse.comhttps://www.fda.gov/media/152166/download  Fact Sheet for Healthcare Providers:  SeriousBroker.ithttps://www.fda.gov/media/152162/download  This test is no t yet approved or cleared by the [place] FDA and  has been authorized for detection and/or diagnosis of SARS-CoV-2 by FDA under an Emergency Use Authorization (EUA). This EUA will remain  in effect (meaning this test can be used) for the duration of the COVID-19 declaration under Section 564(b)(1) of  the Act, 21 U.S.C.section 360bbb-3(b)(1), unless the authorization is terminated  or revoked sooner.       Influenza A by PCR NEGATIVE NEGATIVE Final   Influenza B by PCR NEGATIVE NEGATIVE Final    Comment: (NOTE) The Xpert Xpress SARS-CoV-2/FLU/RSV plus assay is intended as an aid in the diagnosis of influenza from Nasopharyngeal swab specimens and should not be used as a sole basis for treatment. Nasal washings and aspirates are unacceptable for Xpert Xpress SARS-CoV-2/FLU/RSV testing.  Fact Sheet for Patients: BloggerCourse.comhttps://www.fda.gov/media/152166/download  Fact Sheet for Healthcare Providers: SeriousBroker.ithttps://www.fda.gov/media/152162/download  This test is not yet approved or cleared by the [place] FDA and has been authorized for detection and/or diagnosis of SARS-CoV-2 by FDA under an Emergency Use Authorization (EUA). This EUA will remain in effect (meaning this test can be used) for the duration of the COVID-19 declaration under Section 564(b)(1) of the Act, 21 U.S.C. section 360bbb-3(b)(1), unless the authorization is terminated or revoked.  Performed at [facility], [address] [place], [place] [address]   Surgical pcr screen     Status: None   Collection Time: 03/15/21 12:41 AM   Specimen: Nasal Mucosa; Nasal Swab  Result Value Ref Range Status   MRSA, PCR NEGATIVE NEGATIVE Final   Staphylococcus aureus NEGATIVE NEGATIVE Final    Comment: (NOTE) The Xpert SA Assay (FDA approved for NASAL specimens in patients 70 years of age and older), is one component of a comprehensive surveillance program. It is not intended to diagnose infection nor to guide or monitor treatment. Performed at [facility], [address] [place], [place] [address]   Resp Panel by RT-PCR (Flu A&B, Covid) Nasopharyngeal Swab     Status: None   Collection Time: 03/20/21  3:57 PM   Specimen: Nasopharyngeal Swab; Nasopharyngeal(NP) swabs in vial transport medium  Result Value Ref Range Status    SARS Coronavirus 2 by RT PCR NEGATIVE NEGATIVE Final    Comment: (NOTE) SARS-CoV-2 target nucleic acids are NOT DETECTED.  The SARS-CoV-2 RNA is generally detectable in upper respiratory specimens during the acute phase of infection. The lowest concentration of SARS-CoV-2 viral copies this assay can detect is 138 copies/mL. A negative result does not preclude SARS-Cov-2 infection and should not be used as the sole basis for treatment or other patient management decisions. A negative result may occur with  improper specimen collection/handling, submission of specimen other than nasopharyngeal swab, presence of viral mutation(s) within the areas targeted by  this assay, and inadequate number of viral copies(<138 copies/mL). A negative result must be combined with clinical observations, patient history, and epidemiological information. The expected result is Negative.  Fact Sheet for Patients:  BloggerCourse.com  Fact Sheet for Healthcare Providers:  SeriousBroker.it  This test is no t yet approved or cleared by the [place] FDA and  has been authorized for detection and/or diagnosis of SARS-CoV-2 by FDA under an Emergency Use Authorization (EUA). This EUA will remain  in effect (meaning this test can be used) for the duration of the COVID-19 declaration under Section 564(b)(1) of the Act, 21 U.S.C.section 360bbb-3(b)(1), unless the authorization is terminated  or revoked sooner.       Influenza A by PCR NEGATIVE NEGATIVE Final   Influenza B by PCR NEGATIVE NEGATIVE Final    Comment: (NOTE) The Xpert Xpress SARS-CoV-2/FLU/RSV plus assay is intended as an aid in the diagnosis of influenza from Nasopharyngeal swab specimens and should not be used as a sole basis for treatment. Nasal washings and aspirates are unacceptable for Xpert Xpress SARS-CoV-2/FLU/RSV testing.  Fact Sheet for  Patients: BloggerCourse.com  Fact Sheet for Healthcare Providers: SeriousBroker.it  This test is not yet approved or cleared by the [place] FDA and has been authorized for detection and/or diagnosis of SARS-CoV-2 by FDA under an Emergency Use Authorization (EUA). This EUA will remain in effect (meaning this test can be used) for the duration of the COVID-19 declaration under Section 564(b)(1) of the Act, 21 U.S.C. section 360bbb-3(b)(1), unless the authorization is terminated or revoked.  Performed at [facility], [address] [place], [place] [address]      Radiology Studies: No results found.  Scheduled Meds: . Chlorhexidine Gluconate Cloth  6 each Topical Daily  . docusate sodium  100 mg Oral BID  . enoxaparin (LOVENOX) injection  40 mg Subcutaneous Q24H  . insulin aspart  0-9 Units Subcutaneous Q6H  . pantoprazole (PROTONIX) IV  40 mg Intravenous Q12H  . polyethylene glycol  17 g Oral Daily   Continuous Infusions: . sodium chloride 10 mL/hr at 03/20/21 1757     LOS: 7 days   Shon Hale, MD How to contact the [facility] Attending or Consulting provider 7A - 7P or covering provider during after hours 7P -7A, for this patient?  1. Check the care team in [facility] and look for a) attending/consulting TRH provider listed and b) the [facility] team listed 2. Log into [email or web address] and use [place]'s universal password to access. If you do not have the password, please contact the hospital operator. 3. Locate the [facility] provider you are looking for under [facility] and page to a number that you can be directly reached. 4. If you still have difficulty reaching the provider, please page the [facility] (Director on Call) for the Hospitalists listed on amion for assistance.  03/20/2021, 7:51 PM

## 2021-03-21 DIAGNOSIS — E119 Type 2 diabetes mellitus without complications: Secondary | ICD-10-CM | POA: Diagnosis not present

## 2021-03-21 DIAGNOSIS — K56609 Unspecified intestinal obstruction, unspecified as to partial versus complete obstruction: Secondary | ICD-10-CM | POA: Diagnosis not present

## 2021-03-21 DIAGNOSIS — K432 Incisional hernia without obstruction or gangrene: Secondary | ICD-10-CM | POA: Diagnosis not present

## 2021-03-21 DIAGNOSIS — E1140 Type 2 diabetes mellitus with diabetic neuropathy, unspecified: Secondary | ICD-10-CM | POA: Diagnosis not present

## 2021-03-21 DIAGNOSIS — R2689 Other abnormalities of gait and mobility: Secondary | ICD-10-CM | POA: Diagnosis not present

## 2021-03-21 DIAGNOSIS — J45909 Unspecified asthma, uncomplicated: Secondary | ICD-10-CM | POA: Diagnosis not present

## 2021-03-21 DIAGNOSIS — Z48815 Encounter for surgical aftercare following surgery on the digestive system: Secondary | ICD-10-CM | POA: Diagnosis not present

## 2021-03-21 DIAGNOSIS — R6889 Other general symptoms and signs: Secondary | ICD-10-CM | POA: Diagnosis not present

## 2021-03-21 DIAGNOSIS — R1310 Dysphagia, unspecified: Secondary | ICD-10-CM | POA: Diagnosis not present

## 2021-03-21 DIAGNOSIS — D649 Anemia, unspecified: Secondary | ICD-10-CM | POA: Diagnosis not present

## 2021-03-21 DIAGNOSIS — M6281 Muscle weakness (generalized): Secondary | ICD-10-CM | POA: Diagnosis not present

## 2021-03-21 DIAGNOSIS — Z7401 Bed confinement status: Secondary | ICD-10-CM | POA: Diagnosis not present

## 2021-03-21 DIAGNOSIS — I10 Essential (primary) hypertension: Secondary | ICD-10-CM | POA: Diagnosis not present

## 2021-03-21 LAB — GLUCOSE, CAPILLARY
Glucose-Capillary: 182 mg/dL — ABNORMAL HIGH (ref 70–99)
Glucose-Capillary: 184 mg/dL — ABNORMAL HIGH (ref 70–99)

## 2021-03-21 MED ORDER — HYDROCHLOROTHIAZIDE 12.5 MG PO CAPS: 12.5000 mg | ORAL_CAPSULE | Freq: Every day | ORAL | 0 refills | Status: DC

## 2021-03-21 MED ORDER — ACETAMINOPHEN 325 MG PO TABS: 650.0000 mg | ORAL_TABLET | Freq: Four times a day (QID) | ORAL | 2 refills | Status: DC | PRN

## 2021-03-21 MED ORDER — ONDANSETRON HCL 4 MG PO TABS: 4.0000 mg | ORAL_TABLET | Freq: Four times a day (QID) | ORAL | 0 refills | Status: DC | PRN

## 2021-03-21 MED ORDER — OXYCODONE HCL 5 MG PO TABS: 5.0000 mg | ORAL_TABLET | ORAL | 0 refills | Status: DC | PRN

## 2021-03-21 MED ORDER — HUMULIN 70/30 (70-30) 100 UNIT/ML ~~LOC~~ SUSP: 45.0000 [IU] | Freq: Two times a day (BID) | SUBCUTANEOUS | 11 refills | Status: DC

## 2021-03-21 MED ORDER — SENNOSIDES-DOCUSATE SODIUM 8.6-50 MG PO TABS: 2.0000 | ORAL_TABLET | Freq: Every day | ORAL | 11 refills | Status: AC

## 2021-03-21 MED ORDER — POLYETHYLENE GLYCOL 3350 17 G PO PACK: 17.0000 g | PACK | Freq: Every day | ORAL | 1 refills | Status: AC

## 2021-03-21 NOTE — Progress Notes (Signed)
Patient abdominal dressing changed with wet gauze, abd pads and surgical tape.

## 2021-03-21 NOTE — TOC Transition Note (Signed)
Transition of Care [facility]) - CM/SW Discharge Note   Patient Details  Name: [name] MRN: [id number] Date of Birth: January 16, 1951  [facility] of [facility]) CM/SW Contact:  Leitha Bleak, RN Phone Number: 03/21/2021, 9:14 AM   Clinical Narrative:   Insurance auth received, Elease Hashimoto at [facility] is ready for patient to admit today. RN to call report. Medical necessity completed. EMS will be called when RN is ready.    Final next level of care: Skilled Nursing Facility Barriers to Discharge: Continued Medical Work up  Patient Goals and CMS Choice Patient states their goals for this hospitalization and ongoing recovery are:: to go to rehab short term CMS Medicare.gov Compare Post Acute Care list provided to:: Patient Choice offered to / list presented to : Patient  Discharge Placement              Patient chooses bed at:  [facility]) Patient to be transferred to facility by: EMS   Patient and family notified of of transfer: 03/21/21  Discharge Plan and Services     Post Acute Care Choice: Skilled Nursing Facility            Social Determinants of Health (SDOH) Interventions   Readmission Risk Interventions Readmission Risk Prevention Plan 03/20/2021  Post Dischage Appt Complete  Medication Screening Complete  Transportation Screening Complete  Some recent data might be hidden

## 2021-03-21 NOTE — Discharge Summary (Signed)
[name], is a 70 y.o. female  DOB 1950-11-18  MRN [id number].  Admission date:  03/12/2021  Admitting Physician  Cleora Fleet, MD  Discharge Date:  03/21/2021   Primary MD  Mirna Mires, MD  Recommendations for primary care physician for things to follow:   1)Avoid ibuprofen/Advil/Aleve/Motrin/Goody Powders/Naproxen/BC powders/Meloxicam/Diclofenac/Indomethacin and other Nonsteroidal anti-inflammatory medications as these will make you more likely to bleed and can cause stomach ulcers, can also cause Kidney problems.   2)Repeat CBC and BMP blood test on Friday 03/24/21  3)follow up with general surgeon Dr. Henreitta Leber on 03/28/2021 for wound check and for staple removal  4)No heavy lifting > 10 lbs, excessive bending, pushing, pulling, or squatting for 6-8 weeks after surgery.  5) please check blood sugars before meals and at bedtime  6)ABD pads and paper tape with dressing to midline wound daily may change more frequently if soiled   Admission Diagnosis  Intractable abdominal pain [R10.9] Non-intractable vomiting with nausea, unspecified vomiting type [R11.2] Abdominal pain [R10.9]  Discharge Diagnosis  Intractable abdominal pain [R10.9] Non-intractable vomiting with nausea, unspecified vomiting type [R11.2] Abdominal pain [R10.9]    Principal Problem:   Incisional hernia, without obstruction or gangrene Active Problems:   Intractable abdominal pain   Diabetes (HCC)   Essential hypertension   Malrotation of intestine   Abdominal pain   Non-intractable vomiting   Chest pain at rest     Past Medical History:  Diagnosis Date  . Asthma   . Bronchitis   . Diabetes mellitus   . Hyperlipidemia   . Hypertension   . Interatrial cardiac shunt    a. small secundum ASD vs pulmonary shunt.  . Low back pain   . Migraine   . Mild carotid artery disease (HCC)   . Osteoarthritis   .  Sciatica   . Varicose veins   . Vertigo     Past Surgical History:  Procedure Laterality Date  . ABDOMINAL HYSTERECTOMY    . BIOPSY  05/12/2019   Procedure: BIOPSY;  Surgeon: West Bali, MD;  [facility]: [facility];  Service: Endoscopy;;  . CARPAL TUNNEL RELEASE Bilateral   . CESAREAN SECTION    . ESOPHAGOGASTRODUODENOSCOPY (EGD) WITH PROPOFOL N/A 05/12/2019   Procedure: ESOPHAGOGASTRODUODENOSCOPY (EGD) WITH PROPOFOL;  Surgeon: West Bali, MD;  normal-appearing esophagus s/p empiric dilation, moderate gastritis due to ASA s/p biopsy, normal examined duodenum.  Biopsies with gastritis.   Sherald Hess HERNIA REPAIR N/A 03/15/2021   Procedure: Sherald Hess HERNIORRHAPHY WITH MESH;  Surgeon: Lucretia Roers, MD;  Location: [facility];  Service: General;  Laterality: N/A;  . INGUINAL HERNIA REPAIR Left   . LAPAROTOMY N/A 03/15/2021   Procedure: EXPLORATORY LAPAROTOMY;  Surgeon: Lucretia Roers, MD;  Location: [facility];  Service: General;  Laterality: N/A;  . LUMBAR FUSION    . LYSIS OF ADHESION N/A 03/15/2021   Procedure: LYSIS OF ADHESION;  Surgeon: Lucretia Roers, MD;  Location: [facility];  Service: General;  Laterality: N/A;  . POSTERIOR  LUMBAR FUSION 4 LEVEL N/A 03/04/2018   Procedure: Right transforaminal lumbar interbody fusion L1-2, L2-3 Posterior fusion T10, T11, T12, L1, L2 with T 11, T12 pedicle screws, superior sublaminar hooks T10, local bone graft, allograft cancellous chips Vivigen;  Surgeon: Kerrin Champagne, MD;  Location: [facility];  Service: Orthopedics;  Laterality: N/A;  . SAVORY DILATION N/A 05/12/2019   Procedure: SAVORY DILATION;  Surgeon: West Bali, MD;  [facility];  Service: Endoscopy;  Laterality: N/A;     HPI  from the history and physical done on the day of admission:    Chief Complaint: Vomiting  HPI: [name] is a 70 y.o. female with medical history significant for diabetes mellitus, hypertension, asthma.  Patient presented to the  ED with complaints of abdominal pain, chest pain, vomiting and diarrhea.  She reports midsternal chest pain has been ongoing since discharge from the hospital, she was recently admitted for same, cardiology was consulted, symptoms were thought not consistent with cardiac etiology, her calculated coronary calcium score was 0 by coronary CT 05/2019, GI evaluation was recommended. She describes burning midsternal chest pain worse when she vomits.  She reports onset of vomiting today, she has had 6 episodes of vomiting today, no blood.  She also reports 6 episodes of bloody loose stools today.  She reports associated abdominal pain right lower abdomen, and bloating and intermittent burning pain with urination.  ED Course: Temperature 98.5.  Heart rate 95-103, respiratory rate 14-24.  Blood pressure 130s to 170s.  O2 sats 90 to 95% on room air.  Troponin 7 > 9.  EKG unchanged.  Normal lipase 29.  Abdominal CT without contrast (patient vomited oral contrast)-anterior abdominal wall hernia without evidence of incarceration, and new malnutrition of the small bowel.  No evidence of obstruction. EDP talked to Dr. Henreitta Leber, recommended NG tube placement for now.  Review of Systems: As per HPI all other systems reviewed and negative      Hospital Course:     A/p 1)Abdominal Pain - on 03/15/21 patient underwent  Exploratory laparotomy, lysis of adhesions/reduction of small bowel to the right side of the abdomen and colon to the left side of abdomen (Ladd's procedure),incisionalhernia repair with mesh (Ventralight ST Mesh 20.3X 25.4cm) -Passing gas, NG tube out, tolerating diet -No BM yet---as per Gen surgeon its ok to try Miralax   2)Atypical chest pain -her symptoms are suggesting uncontrolled gastritis, -No further chest pains -- continue Protonix and appreciate GI consultation  3)Type 2 diabetes mellitus with neurological complications -  -A1c is 9.6 reflecting uncontrolled DM with hyperglycemia  PTA Resume 70/30 insulin  4)Leukocytosis - reactive, resolved now with normal WBC.   5)Acute anemia due to acute perioperative blood loss --hemoglobin currently around  10,   Generalized weakness and ambulatory dysfunction--- PT recommends SNF rehab - transfer to SNF rehab - --Hypoxia resolved  Code Status: full  Family Communication: pt and sister Disposition: home   Dispo: The patient is from: Home  Anticipated d/c is to: SNF 03/21/21  Patient currently is medically stable to d/c.              Difficult to place patient No   Consultants:   GI   Surgery   Procedures:   NG placement  -Exploratory laparotomy on 03/15/2021 Antimicrobials:  Ceftriaxone stopped 5/30 metronidazole stopped 5/30  Discharge Condition: stable  Follow UP   Contact information for follow-up providers    Lucretia Roers, MD Follow up on  03/28/2021.   Specialty: General Surgery Why: staple removal wound check  Contact information: [address] [place] [facility] [address] [phone]            Contact information for after-discharge care    Destination    [facility] Preferred SNF .   Service: Skilled Nursing Contact information: [address] [place] [address] [phone]                  Diet and Activity recommendation:  As advised  Discharge Instructions    Discharge Instructions    Call MD for:  difficulty breathing, headache or visual disturbances   Complete by: As directed    Call MD for:  persistant dizziness or light-headedness   Complete by: As directed    Call MD for:  persistant nausea and vomiting   Complete by: As directed    Call MD for:  temperature >100.4   Complete by: As directed    Diet - low sodium heart healthy   Complete by: As directed    Diet Carb Modified   Complete by: As directed    Discharge instructions   Complete by: As directed     1)Avoid ibuprofen/Advil/Aleve/Motrin/Goody Powders/Naproxen/BC powders/Meloxicam/Diclofenac/Indomethacin and other Nonsteroidal anti-inflammatory medications as these will make you more likely to bleed and can cause stomach ulcers, can also cause Kidney problems.   2)Repeat CBC and BMP blood test on Friday 03/24/21  3)follow up with general surgeon Dr. Henreitta Leber on 03/28/2021 for wound check and for staple removal  4)No heavy lifting > 10 lbs, excessive bending, pushing, pulling, or squatting for 6-8 weeks after surgery.  5) please check blood sugars before meals and at bedtime  6)ABD pads and paper tape with dressing to midline wound daily may change more frequently if soiled   Discharge wound care:   Complete by: As directed    ABD pads and paper tape with dressing to midline wound daily may change more frequently if soiled   Increase activity slowly   Complete by: As directed         Discharge Medications     Allergies as of 03/21/2021   No Known Allergies     Medication List    STOP taking these medications   predniSONE 5 MG (48) Tbpk tablet Commonly known as: STERAPRED UNI-PAK 48 TAB     TAKE these medications   acetaminophen 325 MG tablet Commonly known as: TYLENOL Take 2 tablets (650 mg total) by mouth every 6 (six) hours as needed for mild pain (or Fever >/= 101).   amLODipine 10 MG tablet Commonly known as: NORVASC Take 1 tablet by mouth daily.   aspirin EC 81 MG tablet Take 1 tablet (81 mg total) by mouth daily with breakfast.   fluorometholone 0.1 % ophthalmic suspension Commonly known as: FML 1 drop 3 (three) times daily.   gabapentin 100 MG capsule Commonly known as: NEURONTIN Take 100 mg by mouth daily as needed (pain).   HumuLIN 70/30 (70-30) 100 UNIT/ML injection Generic drug: insulin NPH-regular Human Inject 45 Units into the skin 2 (two) times daily with a meal. What changed:   how much to take  when to take this   hydrochlorothiazide 12.5 MG  capsule Commonly known as: MICROZIDE Take 1 capsule (12.5 mg total) by mouth daily. What changed: how much to take   losartan 50 MG tablet Commonly known as: COZAAR Take 50 mg by mouth daily.   metFORMIN  1000 MG tablet Commonly known as: GLUCOPHAGE Take 1,000 mg by mouth at bedtime.   nitroGLYCERIN 0.4 MG SL tablet Commonly known as: NITROSTAT Place 0.4 mg under the tongue every 5 (five) minutes as needed for chest pain. Dissolve one tablet under the tongue every 5 mintues as needed for chest pain.   ondansetron 4 MG tablet Commonly known as: ZOFRAN Take 1 tablet (4 mg total) by mouth every 6 (six) hours as needed for nausea.   oxyCODONE 5 MG immediate release tablet Commonly known as: Oxy IR/ROXICODONE Take 1 tablet (5 mg total) by mouth every 4 (four) hours as needed for moderate pain.   pantoprazole 40 MG tablet Commonly known as: Protonix Take 1 tablet (40 mg total) by mouth daily.   polyethylene glycol 17 g packet Commonly known as: MIRALAX / GLYCOLAX Take 17 g by mouth daily. Start taking on: March 22, 2021   Rybelsus 3 MG Tabs Generic drug: Semaglutide Take 1 tablet by mouth daily.   senna-docusate 8.6-50 MG tablet Commonly known as: Senokot-S Take 2 tablets by mouth at bedtime.   simvastatin 20 MG tablet Commonly known as: ZOCOR Take 20 mg by mouth at bedtime.            Discharge Care Instructions  (From admission, onward)         Start     Ordered   03/21/21 0000  Discharge wound care:       Comments: ABD pads and paper tape with dressing to midline wound daily may change more frequently if soiled   03/21/21 1116          Major procedures and Radiology Reports - PLEASE review detailed and final reports for all details, in brief -   CT ABDOMEN PELVIS WO CONTRAST  Result Date: 03/12/2021 CLINICAL DATA:  Abdominal pain, nausea vomiting. EXAM: CT ABDOMEN AND PELVIS WITHOUT CONTRAST TECHNIQUE: Multidetector CT imaging of the abdomen and pelvis  was performed following the standard protocol without IV contrast. COMPARISON:  November 25, 2020 FINDINGS: Lower chest: Right middle lobe atelectasis. Hepatobiliary: No focal liver abnormality is seen. Status post cholecystectomy. No biliary dilatation. Pancreas: Unremarkable. No pancreatic ductal dilatation or surrounding inflammatory changes. Spleen: Normal in size without focal abnormality. Adrenals/Urinary Tract: Adrenal glands are unremarkable. Kidneys are normal, without renal calculi, focal lesion, or hydronephrosis. Bladder is unremarkable. Stomach/Bowel: The stomach appears normal. There is known malrotation of the small bowel. No evidence of small-bowel obstruction. There is diastasis safe the anterior abdominal wall. There is also an anterior abdominal wall periumbilical hernia which contains short segments of colon and small bowel. There is no definite evidence of incarceration. Vascular/Lymphatic: Aortic atherosclerosis. No enlarged abdominal or pelvic lymph nodes. Reproductive: Status post hysterectomy. No adnexal masses. Other: No abdominopelvic ascites. Musculoskeletal: Prior spinal fusion.  No acute osseous findings. IMPRESSION: 1. Anterior abdominal wall periumbilical hernia which contains short segments of colon and small bowel. No definite evidence of incarceration. 2. Known malrotation of the small bowel. No evidence of small-bowel obstruction. 3. No evidence of acute abnormalities within the solid abdominal organs. Aortic Atherosclerosis (ICD10-I70.0). Electronically Signed   By: Ted Mcalpine M.D.   On: 03/12/2021 15:02   DG Abd 1 View  Result Date: 03/17/2021 CLINICAL DATA:  Hypoxemia. Abdominal pain. Diabetes. Recent abdominal surgery. EXAM: ABDOMEN - 1 VIEW COMPARISON:  Radiographs and CT 03/12/2021. FINDINGS: 0849 hours. Two portable supine views are submitted. The visualized bowel gas pattern is nonobstructive. There is no supine evidence of free intraperitoneal  air. New skin  staples are present over the left lower abdominal wall. There are extensive postsurgical changes throughout the thoracolumbar spine. Nasogastric tube has been removed in the interval. IMPRESSION: Evidence of interval abdominal surgery. No acute abdominal findings identified radiographically. Electronically Signed   By: Carey BullocksWilliam  Veazey M.D.   On: 03/17/2021 10:33   DG Abdomen 1 View  Result Date: 03/12/2021 CLINICAL DATA:  Check gastric catheter placement EXAM: ABDOMEN - 1 VIEW COMPARISON:  None. FINDINGS: Gastric catheter is noted deep within the stomach. Postsurgical changes in the thoracolumbar spine are noted. No obstructive changes or free air is seen. IMPRESSION: Gastric catheter deep within the stomach. Electronically Signed   By: Alcide CleverMark  Lukens M.D.   On: 03/12/2021 16:09   DG Chest Port 1 View  Result Date: 03/17/2021 CLINICAL DATA:  Hypoxemia. EXAM: PORTABLE CHEST 1 VIEW COMPARISON:  03/13/2021 and 03/12/2021. FINDINGS: 0855 hours. Presumed new endotracheal tube projecting over the mid trachea. The heart size and mediastinal contours are stable. There is stable elevation of the right hemidiaphragm with mild chronic right basilar atelectasis. The lungs are otherwise clear. There is no pleural effusion or pneumothorax. Cholecystectomy clips and prior thoracolumbar fusion are noted. IMPRESSION: Presumed interval intubation with satisfactory position of the endotracheal tube. No other significant changes. Electronically Signed   By: Carey BullocksWilliam  Veazey M.D.   On: 03/17/2021 10:30   DG CHEST PORT 1 VIEW  Result Date: 03/13/2021 CLINICAL DATA:  Shortness of breath EXAM: PORTABLE CHEST 1 VIEW COMPARISON:  Mar 12, 2021 FINDINGS: The cardiomediastinal silhouette is unchanged in contour.Unchanged elevation of the RIGHT hemidiaphragm. Unchanged obscuration of the LEFT heart border, likely due to pericardial fat. No pleural effusion. No pneumothorax. No acute pleuroparenchymal abnormality. Visualized abdomen is  unremarkable. Multilevel degenerative changes of the thoracic spine. Status post posterior fixation of the thoracolumbar spine. IMPRESSION: No acute cardiopulmonary abnormality. Electronically Signed   By: Meda KlinefelterStephanie  Peacock MD   On: 03/13/2021 14:51   DG Chest Portable 1 View  Result Date: 03/12/2021 CLINICAL DATA:  Chest pain EXAM: PORTABLE CHEST 1 VIEW COMPARISON:  Feb 14, 2021 FINDINGS: The cardiomediastinal silhouette is unchanged in contour.Unchanged elevation of the RIGHT hemidiaphragm. No pleural effusion. No pneumothorax. Obscure a shin of the LEFT costophrenic angle is likely due to technique and pericardial fat. No acute pleuroparenchymal abnormality. Visualized abdomen is unremarkable. Multilevel degenerative changes of the thoracic spine. Status post posterior fixation of the lower thoracic spine. IMPRESSION: No acute cardiopulmonary abnormality. Electronically Signed   By: Meda KlinefelterStephanie  Peacock MD   On: 03/12/2021 12:01    Micro Results   Recent Results (from the past 240 hour(s))  Culture, Urine     Status: Abnormal   Collection Time: 03/12/21  3:21 PM   Specimen: Urine, Clean Catch  Result Value Ref Range Status   Specimen Description   Final    URINE, CLEAN CATCH Performed at [facility], [address] [place], [place] [address]    Special Requests   Final    NONE Performed at [facility], [address] [place], [place] [address]    Culture MULTIPLE SPECIES PRESENT, SUGGEST RECOLLECTION (A)  Final   Report Status 03/14/2021 FINAL  Final  Resp Panel by RT-PCR (Flu A&B, Covid) Nasopharyngeal Swab     Status: None   Collection Time: 03/12/21  3:56 PM   Specimen: Nasopharyngeal Swab; Nasopharyngeal(NP) swabs in vial transport medium  Result Value Ref Range Status   SARS Coronavirus 2 by RT PCR NEGATIVE NEGATIVE Final  Comment: (NOTE) SARS-CoV-2 target nucleic acids are NOT DETECTED.  The SARS-CoV-2 RNA is generally detectable in upper respiratory specimens during the  acute phase of infection. The lowest concentration of SARS-CoV-2 viral copies this assay can detect is 138 copies/mL. A negative result does not preclude SARS-Cov-2 infection and should not be used as the sole basis for treatment or other patient management decisions. A negative result may occur with  improper specimen collection/handling, submission of specimen other than nasopharyngeal swab, presence of viral mutation(s) within the areas targeted by this assay, and inadequate number of viral copies(<138 copies/mL). A negative result must be combined with clinical observations, patient history, and epidemiological information. The expected result is Negative.  Fact Sheet for Patients:  BloggerCourse.com  Fact Sheet for Healthcare Providers:  SeriousBroker.it  This test is no t yet approved or cleared by the [place] FDA and  has been authorized for detection and/or diagnosis of SARS-CoV-2 by FDA under an Emergency Use Authorization (EUA). This EUA will remain  in effect (meaning this test can be used) for the duration of the COVID-19 declaration under Section 564(b)(1) of the Act, 21 U.S.C.section 360bbb-3(b)(1), unless the authorization is terminated  or revoked sooner.       Influenza A by PCR NEGATIVE NEGATIVE Final   Influenza B by PCR NEGATIVE NEGATIVE Final    Comment: (NOTE) The Xpert Xpress SARS-CoV-2/FLU/RSV plus assay is intended as an aid in the diagnosis of influenza from Nasopharyngeal swab specimens and should not be used as a sole basis for treatment. Nasal washings and aspirates are unacceptable for Xpert Xpress SARS-CoV-2/FLU/RSV testing.  Fact Sheet for Patients: BloggerCourse.com  Fact Sheet for Healthcare Providers: SeriousBroker.it  This test is not yet approved or cleared by the [place] FDA and has been authorized for detection and/or  diagnosis of SARS-CoV-2 by FDA under an Emergency Use Authorization (EUA). This EUA will remain in effect (meaning this test can be used) for the duration of the COVID-19 declaration under Section 564(b)(1) of the Act, 21 U.S.C. section 360bbb-3(b)(1), unless the authorization is terminated or revoked.  Performed at [facility], [address] [place], [place] [address]   Surgical pcr screen     Status: None   Collection Time: 03/15/21 12:41 AM   Specimen: Nasal Mucosa; Nasal Swab  Result Value Ref Range Status   MRSA, PCR NEGATIVE NEGATIVE Final   Staphylococcus aureus NEGATIVE NEGATIVE Final    Comment: (NOTE) The Xpert SA Assay (FDA approved for NASAL specimens in patients 24 years of age and older), is one component of a comprehensive surveillance program. It is not intended to diagnose infection nor to guide or monitor treatment. Performed at [facility], [address], [place], [place] [address]   Resp Panel by RT-PCR (Flu A&B, Covid) Nasopharyngeal Swab     Status: None   Collection Time: 03/20/21  3:57 PM   Specimen: Nasopharyngeal Swab; Nasopharyngeal(NP) swabs in vial transport medium  Result Value Ref Range Status   SARS Coronavirus 2 by RT PCR NEGATIVE NEGATIVE Final    Comment: (NOTE) SARS-CoV-2 target nucleic acids are NOT DETECTED.  The SARS-CoV-2 RNA is generally detectable in upper respiratory specimens during the acute phase of infection. The lowest concentration of SARS-CoV-2 viral copies this assay can detect is 138 copies/mL. A negative result does not preclude SARS-Cov-2 infection and should not be used as the sole basis for treatment or other patient management decisions. A negative result may occur with  improper specimen collection/handling, submission of  specimen other than nasopharyngeal swab, presence of viral mutation(s) within the areas targeted by this assay, and inadequate number of viral copies(<138 copies/mL). A negative result must be  combined with clinical observations, patient history, and epidemiological information. The expected result is Negative.  Fact Sheet for Patients:  BloggerCourse.com  Fact Sheet for Healthcare Providers:  SeriousBroker.it  This test is no t yet approved or cleared by the [place] FDA and  has been authorized for detection and/or diagnosis of SARS-CoV-2 by FDA under an Emergency Use Authorization (EUA). This EUA will remain  in effect (meaning this test can be used) for the duration of the COVID-19 declaration under Section 564(b)(1) of the Act, 21 U.S.C.section 360bbb-3(b)(1), unless the authorization is terminated  or revoked sooner.       Influenza A by PCR NEGATIVE NEGATIVE Final   Influenza B by PCR NEGATIVE NEGATIVE Final    Comment: (NOTE) The Xpert Xpress SARS-CoV-2/FLU/RSV plus assay is intended as an aid in the diagnosis of influenza from Nasopharyngeal swab specimens and should not be used as a sole basis for treatment. Nasal washings and aspirates are unacceptable for Xpert Xpress SARS-CoV-2/FLU/RSV testing.  Fact Sheet for Patients: BloggerCourse.com  Fact Sheet for Healthcare Providers: SeriousBroker.it  This test is not yet approved or cleared by the [place] FDA and has been authorized for detection and/or diagnosis of SARS-CoV-2 by FDA under an Emergency Use Authorization (EUA). This EUA will remain in effect (meaning this test can be used) for the duration of the COVID-19 declaration under Section 564(b)(1) of the Act, 21 U.S.C. section 360bbb-3(b)(1), unless the authorization is terminated or revoked.  Performed at [facility], [address] [place], [place] [address]        Today   Subjective    [name] today has no new complaints  No fever  Or chills   No Nausea, Vomiting or Diarrhea        Patient has been seen and  examined prior to discharge   Objective   Blood pressure (!) 151/69, pulse 79, temperature 99 F (37.2 C), temperature source Oral, resp. rate 16, height 5\' 5"  (1.651 m), weight 85.7 kg, SpO2 94 %.   Intake/Output Summary (Last 24 hours) at 03/21/2021 1117 Last data filed at 03/20/2021 1809 Gross per 24 hour  Intake 420 ml  Output --  Net 420 ml    Exam General exam: chronically ill appearing, In no apparent distress Respiratory system: CTAB, air movement is fair and symmetrical  cardiovascular system: normal S1 & S2 heard. RRR Gastrointestinal system: +ve bowel sounds, appropriate postop tenderness, postop wound mostly intact , not distended Central nervous system: Alert and oriented.  Generalized weakness without new focal neurological deficits. Extremities: Symmetric 5 x 5 power. Skin: No rashes, lesions or ulcers.   Psychiatry: Judgement and insight appear normal. Mood & affect appropriate.   Data Review   CBC w Diff:  Lab Results  Component Value Date   WBC 8.2 03/20/2021   HGB 9.8 (L) 03/20/2021   HCT 32.0 (L) 03/20/2021   PLT 230 03/20/2021   LYMPHOPCT 14 03/18/2021   MONOPCT 5 03/18/2021   EOSPCT 5 03/18/2021   BASOPCT 0 03/18/2021   CMP:  Lab Results  Component Value Date   NA 140 03/20/2021   K 3.9 03/20/2021   CL 105 03/20/2021   CO2 29 03/20/2021   BUN 12 03/20/2021   CREATININE 0.70 03/20/2021   PROT 5.8 (L) 03/18/2021   ALBUMIN 2.7 (L)  03/18/2021   BILITOT 0.9 03/18/2021   ALKPHOS 41 03/18/2021   AST 28 03/18/2021   ALT 32 03/18/2021  . Total Discharge time is about 33 minutes  Shon Hale M.D on 03/21/2021 at 11:17 AM  Go to [email or web address] -  for contact info  [facility] - Office  [phone]

## 2021-03-22 DIAGNOSIS — I10 Essential (primary) hypertension: Secondary | ICD-10-CM | POA: Diagnosis not present

## 2021-03-22 DIAGNOSIS — K56609 Unspecified intestinal obstruction, unspecified as to partial versus complete obstruction: Secondary | ICD-10-CM | POA: Diagnosis not present

## 2021-03-22 DIAGNOSIS — E119 Type 2 diabetes mellitus without complications: Secondary | ICD-10-CM | POA: Diagnosis not present

## 2021-03-22 DIAGNOSIS — D649 Anemia, unspecified: Secondary | ICD-10-CM | POA: Diagnosis not present

## 2021-03-22 LAB — GLUCOSE, CAPILLARY: Glucose-Capillary: 180 mg/dL — ABNORMAL HIGH (ref 70–99)

## 2021-03-28 ENCOUNTER — Ambulatory Visit (INDEPENDENT_AMBULATORY_CARE_PROVIDER_SITE_OTHER): Payer: Medicare Other | Admitting: General Surgery

## 2021-03-28 ENCOUNTER — Other Ambulatory Visit: Payer: Self-pay

## 2021-03-28 ENCOUNTER — Encounter: Payer: Self-pay | Admitting: General Surgery

## 2021-03-28 VITALS — BP 110/72 | HR 87 | Temp 98.1°F | Resp 12 | Ht 65.0 in | Wt 189.0 lb

## 2021-03-28 DIAGNOSIS — K439 Ventral hernia without obstruction or gangrene: Secondary | ICD-10-CM

## 2021-03-28 DIAGNOSIS — Q433 Congenital malformations of intestinal fixation: Secondary | ICD-10-CM

## 2021-03-28 NOTE — Patient Instructions (Signed)
Continue packing with saline dampened gauze and cover with ABD and papertape daily Steri strips to remain in place, half of staples removed, will remove other half next week Continue rehab Continue to increase activity and diet Continue to keep stools soft and regular with bowel regimen

## 2021-03-28 NOTE — Progress Notes (Signed)
[facility] Note   HPI:  70 y.o. Female presents to clinic for post-op follow-up evaluation after ventral hernia repair and reduction of malrotation. She is doing well at [facility] rehab. She says she is doing therapy in her room. The wound is being packed daily. She is still having some drainage. She says she has an appetite and is having Bms regularly. She says the food is not the best though.   Review of Systems:  No fevers or chills Serous drainage from inferior wound, no redness All other review of systems: otherwise negative   Vital Signs:  BP 110/72   Pulse 87   Temp 98.1 F (36.7 C) (Other (Comment))   Resp 12   Ht 5\' 5"  (1.651 m)   Wt 189 lb (85.7 kg)   SpO2 94%   BMI 31.45 kg/m    Physical Exam:  Physical Exam Vitals reviewed.  HENT:     Head: Normocephalic.  Cardiovascular:     Rate and Rhythm: Normal rate.  Pulmonary:     Effort: Pulmonary effort is normal.  Abdominal:     Palpations: Abdomen is soft.     Tenderness: There is abdominal tenderness.     Hernia: No hernia is present.     Comments: Midline healing, packing removed, serous drainage on ABD pad from inferior open portion, half staples removed, steri strips placed, packing and ABD replaced, no erythema    Neurological:     Mental Status: She is alert.     Assessment:  70 y.o. yo Female S/p ex lap, ventral hernia repair and reduction of malrotation. Doing well and eating and having Bms. Inferior portion of wound with granulation, some serous drainage.  Plan:  Continue packing with saline dampened gauze and cover with ABD and papertape daily Steri strips to remain in place, half of staples removed, will remove other half next week Continue rehab Continue to increase activity and diet Continue to keep stools soft and regular with bowel regimen  Future Appointments  Date Time Provider Department Center  04/04/2021 10:15 AM 04/06/2021, MD [facility] None     Lucretia Roers,  MD [facility] [address] [place], [place] [phone] (office)

## 2021-04-04 ENCOUNTER — Ambulatory Visit (INDEPENDENT_AMBULATORY_CARE_PROVIDER_SITE_OTHER): Payer: Medicare Other | Admitting: General Surgery

## 2021-04-04 ENCOUNTER — Other Ambulatory Visit: Payer: Self-pay

## 2021-04-04 VITALS — BP 97/64 | HR 87 | Temp 98.4°F | Resp 12 | Ht 65.0 in | Wt 189.0 lb

## 2021-04-04 DIAGNOSIS — K439 Ventral hernia without obstruction or gangrene: Secondary | ICD-10-CM

## 2021-04-04 DIAGNOSIS — Q433 Congenital malformations of intestinal fixation: Secondary | ICD-10-CM

## 2021-04-04 NOTE — Progress Notes (Signed)
[facility] Note   HPI:  70 y.o. Female presents to clinic for post-op follow-up evaluation of after Ex lap, ventral hernia repair, reduction of malrotation.  Patient reports doing well.    Review of Systems:  Having Bms and tolerating diet  All other review of systems: otherwise negative   Vital Signs:  BP 97/64   Pulse 87   Temp 98.4 F (36.9 C) (Other (Comment))   Resp 12   Ht 5\' 5"  (1.651 m)   Wt 189 lb (85.7 kg)   SpO2 92%   BMI 31.45 kg/m    Physical Exam:  Physical Exam Vitals reviewed.  Cardiovascular:     Rate and Rhythm: Normal rate.  Pulmonary:     Effort: Pulmonary effort is normal.  Abdominal:     Tenderness: There is no abdominal tenderness.     Hernia: No hernia is present.     Comments: Induration on the right lateral lower aspect of wound, monitor, likely seroma in the hernia cavity, packing replaced, steri strips placed after remaining staples removed, no erythema or drainage    Assessment:  70 y.o. yo Female with s/p ventral hernia repair and malrotation reduction. Doing well.  Plan:  Continue to pack wound with saline dampened gauze daily. Sister or family will need to help pack once home from [facility]. Steri strips will peel up after 5-7 days, ok to remove them at that time. No heavy lifting > 10 lbs, excessive bending, pushing, pulling, or squatting for 6-8 weeks after surgery.  Ok to shower and replace packing after showering. Do not submerge in a tub.   Will check on next week after your Dc from [facility].   Future Appointments  Date Time Provider Department Center  04/13/2021  2:15 PM 04/15/2021, MD [facility] None      Lucretia Roers, MD [facility] [address] [place], [place] [phone] (office)

## 2021-04-04 NOTE — Patient Instructions (Signed)
Continue to pack wound with saline dampened gauze daily. Sister or family will need to help pack once home from [facility]. Steri strips will peel up after 5-7 days, ok to remove them at that time. No heavy lifting > 10 lbs, excessive bending, pushing, pulling, or squatting for 6-8 weeks after surgery.  Ok to shower and replace packing after showering. Do not submerge in a tub.

## 2021-04-10 DIAGNOSIS — E785 Hyperlipidemia, unspecified: Secondary | ICD-10-CM | POA: Diagnosis not present

## 2021-04-10 DIAGNOSIS — E1140 Type 2 diabetes mellitus with diabetic neuropathy, unspecified: Secondary | ICD-10-CM | POA: Diagnosis not present

## 2021-04-10 DIAGNOSIS — Z981 Arthrodesis status: Secondary | ICD-10-CM | POA: Diagnosis not present

## 2021-04-10 DIAGNOSIS — K5900 Constipation, unspecified: Secondary | ICD-10-CM | POA: Diagnosis not present

## 2021-04-10 DIAGNOSIS — K219 Gastro-esophageal reflux disease without esophagitis: Secondary | ICD-10-CM | POA: Diagnosis not present

## 2021-04-10 DIAGNOSIS — Z4801 Encounter for change or removal of surgical wound dressing: Secondary | ICD-10-CM | POA: Diagnosis not present

## 2021-04-10 DIAGNOSIS — M5430 Sciatica, unspecified side: Secondary | ICD-10-CM | POA: Diagnosis not present

## 2021-04-10 DIAGNOSIS — J45909 Unspecified asthma, uncomplicated: Secondary | ICD-10-CM | POA: Diagnosis not present

## 2021-04-10 DIAGNOSIS — Z48815 Encounter for surgical aftercare following surgery on the digestive system: Secondary | ICD-10-CM | POA: Diagnosis not present

## 2021-04-10 DIAGNOSIS — I10 Essential (primary) hypertension: Secondary | ICD-10-CM | POA: Diagnosis not present

## 2021-04-10 DIAGNOSIS — M1990 Unspecified osteoarthritis, unspecified site: Secondary | ICD-10-CM | POA: Diagnosis not present

## 2021-04-11 DIAGNOSIS — Z4801 Encounter for change or removal of surgical wound dressing: Secondary | ICD-10-CM | POA: Diagnosis not present

## 2021-04-11 DIAGNOSIS — E785 Hyperlipidemia, unspecified: Secondary | ICD-10-CM | POA: Diagnosis not present

## 2021-04-11 DIAGNOSIS — Z981 Arthrodesis status: Secondary | ICD-10-CM | POA: Diagnosis not present

## 2021-04-11 DIAGNOSIS — I10 Essential (primary) hypertension: Secondary | ICD-10-CM | POA: Diagnosis not present

## 2021-04-11 DIAGNOSIS — K219 Gastro-esophageal reflux disease without esophagitis: Secondary | ICD-10-CM | POA: Diagnosis not present

## 2021-04-11 DIAGNOSIS — M1990 Unspecified osteoarthritis, unspecified site: Secondary | ICD-10-CM | POA: Diagnosis not present

## 2021-04-11 DIAGNOSIS — M5430 Sciatica, unspecified side: Secondary | ICD-10-CM | POA: Diagnosis not present

## 2021-04-11 DIAGNOSIS — K5900 Constipation, unspecified: Secondary | ICD-10-CM | POA: Diagnosis not present

## 2021-04-11 DIAGNOSIS — E1140 Type 2 diabetes mellitus with diabetic neuropathy, unspecified: Secondary | ICD-10-CM | POA: Diagnosis not present

## 2021-04-11 DIAGNOSIS — J45909 Unspecified asthma, uncomplicated: Secondary | ICD-10-CM | POA: Diagnosis not present

## 2021-04-11 DIAGNOSIS — Z48815 Encounter for surgical aftercare following surgery on the digestive system: Secondary | ICD-10-CM | POA: Diagnosis not present

## 2021-04-13 ENCOUNTER — Ambulatory Visit (INDEPENDENT_AMBULATORY_CARE_PROVIDER_SITE_OTHER): Payer: Medicare Other | Admitting: General Surgery

## 2021-04-13 ENCOUNTER — Other Ambulatory Visit: Payer: Self-pay

## 2021-04-13 ENCOUNTER — Encounter: Payer: Self-pay | Admitting: General Surgery

## 2021-04-13 VITALS — BP 96/61 | HR 95 | Temp 97.6°F | Resp 14 | Ht 65.0 in | Wt 178.8 lb

## 2021-04-13 DIAGNOSIS — E1165 Type 2 diabetes mellitus with hyperglycemia: Secondary | ICD-10-CM | POA: Diagnosis not present

## 2021-04-13 DIAGNOSIS — I10 Essential (primary) hypertension: Secondary | ICD-10-CM | POA: Diagnosis not present

## 2021-04-13 DIAGNOSIS — S31609A Unspecified open wound of abdominal wall, unspecified quadrant with penetration into peritoneal cavity, initial encounter: Secondary | ICD-10-CM | POA: Diagnosis not present

## 2021-04-13 DIAGNOSIS — Z794 Long term (current) use of insulin: Secondary | ICD-10-CM | POA: Diagnosis not present

## 2021-04-13 DIAGNOSIS — K439 Ventral hernia without obstruction or gangrene: Secondary | ICD-10-CM

## 2021-04-13 DIAGNOSIS — Q433 Congenital malformations of intestinal fixation: Secondary | ICD-10-CM

## 2021-04-13 DIAGNOSIS — T8131XD Disruption of external operation (surgical) wound, not elsewhere classified, subsequent encounter: Secondary | ICD-10-CM | POA: Diagnosis not present

## 2021-04-13 DIAGNOSIS — E785 Hyperlipidemia, unspecified: Secondary | ICD-10-CM | POA: Diagnosis not present

## 2021-04-13 NOTE — Patient Instructions (Signed)
Continue dressing changes and therapy.

## 2021-04-13 NOTE — Progress Notes (Signed)
[facility] Note  Patient has been released from rehab and is suppose to be starting PT at home. Her sister is packing the inferior portion of her wound daily. She is eating well and having regular daily Bms which is new for her.   BP 96/61   Pulse 95   Temp 97.6 F (36.4 C) (Oral)   Resp 14   Ht 5\' 5"  (1.651 m)   Wt 178 lb 12.8 oz (81.1 kg)   SpO2 92%   BMI 29.75 kg/m  Healing incision, inferior portion with granulation, packing replaced, no erythema or drainage, no signs of hernia formation  [name] is a 70 yo s/p Ex lap, malrotation reduced, Ventral hernia repair with mesh and evacuation of hematoma from the inferior portion of the wound. Doing well overall.   Continue dressing changes and therapy.  Will see you at the end of the month.  Future Appointments  Date Time Provider Department Center  05/11/2021  2:15 PM 05/13/2021, MD [facility] None   Angela Roers, MD [facility] [address] [place], [place] [phone] (office)

## 2021-04-15 DIAGNOSIS — E785 Hyperlipidemia, unspecified: Secondary | ICD-10-CM | POA: Diagnosis not present

## 2021-04-15 DIAGNOSIS — M5430 Sciatica, unspecified side: Secondary | ICD-10-CM | POA: Diagnosis not present

## 2021-04-15 DIAGNOSIS — I10 Essential (primary) hypertension: Secondary | ICD-10-CM | POA: Diagnosis not present

## 2021-04-15 DIAGNOSIS — Z48815 Encounter for surgical aftercare following surgery on the digestive system: Secondary | ICD-10-CM | POA: Diagnosis not present

## 2021-04-15 DIAGNOSIS — K219 Gastro-esophageal reflux disease without esophagitis: Secondary | ICD-10-CM | POA: Diagnosis not present

## 2021-04-15 DIAGNOSIS — J45909 Unspecified asthma, uncomplicated: Secondary | ICD-10-CM | POA: Diagnosis not present

## 2021-04-15 DIAGNOSIS — M1990 Unspecified osteoarthritis, unspecified site: Secondary | ICD-10-CM | POA: Diagnosis not present

## 2021-04-15 DIAGNOSIS — Z4801 Encounter for change or removal of surgical wound dressing: Secondary | ICD-10-CM | POA: Diagnosis not present

## 2021-04-15 DIAGNOSIS — Z981 Arthrodesis status: Secondary | ICD-10-CM | POA: Diagnosis not present

## 2021-04-15 DIAGNOSIS — K5900 Constipation, unspecified: Secondary | ICD-10-CM | POA: Diagnosis not present

## 2021-04-15 DIAGNOSIS — E1140 Type 2 diabetes mellitus with diabetic neuropathy, unspecified: Secondary | ICD-10-CM | POA: Diagnosis not present

## 2021-04-18 ENCOUNTER — Ambulatory Visit: Payer: Medicare Other | Admitting: General Surgery

## 2021-04-18 DIAGNOSIS — J45909 Unspecified asthma, uncomplicated: Secondary | ICD-10-CM | POA: Diagnosis not present

## 2021-04-18 DIAGNOSIS — K219 Gastro-esophageal reflux disease without esophagitis: Secondary | ICD-10-CM | POA: Diagnosis not present

## 2021-04-18 DIAGNOSIS — M5430 Sciatica, unspecified side: Secondary | ICD-10-CM | POA: Diagnosis not present

## 2021-04-18 DIAGNOSIS — Z4801 Encounter for change or removal of surgical wound dressing: Secondary | ICD-10-CM | POA: Diagnosis not present

## 2021-04-18 DIAGNOSIS — M1990 Unspecified osteoarthritis, unspecified site: Secondary | ICD-10-CM | POA: Diagnosis not present

## 2021-04-18 DIAGNOSIS — I10 Essential (primary) hypertension: Secondary | ICD-10-CM | POA: Diagnosis not present

## 2021-04-18 DIAGNOSIS — K5900 Constipation, unspecified: Secondary | ICD-10-CM | POA: Diagnosis not present

## 2021-04-18 DIAGNOSIS — Z981 Arthrodesis status: Secondary | ICD-10-CM | POA: Diagnosis not present

## 2021-04-18 DIAGNOSIS — E1140 Type 2 diabetes mellitus with diabetic neuropathy, unspecified: Secondary | ICD-10-CM | POA: Diagnosis not present

## 2021-04-18 DIAGNOSIS — E785 Hyperlipidemia, unspecified: Secondary | ICD-10-CM | POA: Diagnosis not present

## 2021-04-18 DIAGNOSIS — Z48815 Encounter for surgical aftercare following surgery on the digestive system: Secondary | ICD-10-CM | POA: Diagnosis not present

## 2021-04-19 DIAGNOSIS — K219 Gastro-esophageal reflux disease without esophagitis: Secondary | ICD-10-CM | POA: Diagnosis not present

## 2021-04-19 DIAGNOSIS — Z981 Arthrodesis status: Secondary | ICD-10-CM | POA: Diagnosis not present

## 2021-04-19 DIAGNOSIS — K5900 Constipation, unspecified: Secondary | ICD-10-CM | POA: Diagnosis not present

## 2021-04-19 DIAGNOSIS — I10 Essential (primary) hypertension: Secondary | ICD-10-CM | POA: Diagnosis not present

## 2021-04-19 DIAGNOSIS — M1990 Unspecified osteoarthritis, unspecified site: Secondary | ICD-10-CM | POA: Diagnosis not present

## 2021-04-19 DIAGNOSIS — M5430 Sciatica, unspecified side: Secondary | ICD-10-CM | POA: Diagnosis not present

## 2021-04-19 DIAGNOSIS — Z4801 Encounter for change or removal of surgical wound dressing: Secondary | ICD-10-CM | POA: Diagnosis not present

## 2021-04-19 DIAGNOSIS — E785 Hyperlipidemia, unspecified: Secondary | ICD-10-CM | POA: Diagnosis not present

## 2021-04-19 DIAGNOSIS — J45909 Unspecified asthma, uncomplicated: Secondary | ICD-10-CM | POA: Diagnosis not present

## 2021-04-19 DIAGNOSIS — Z48815 Encounter for surgical aftercare following surgery on the digestive system: Secondary | ICD-10-CM | POA: Diagnosis not present

## 2021-04-19 DIAGNOSIS — E1140 Type 2 diabetes mellitus with diabetic neuropathy, unspecified: Secondary | ICD-10-CM | POA: Diagnosis not present

## 2021-04-20 DIAGNOSIS — I10 Essential (primary) hypertension: Secondary | ICD-10-CM | POA: Diagnosis not present

## 2021-04-20 DIAGNOSIS — E1140 Type 2 diabetes mellitus with diabetic neuropathy, unspecified: Secondary | ICD-10-CM | POA: Diagnosis not present

## 2021-04-20 DIAGNOSIS — K219 Gastro-esophageal reflux disease without esophagitis: Secondary | ICD-10-CM | POA: Diagnosis not present

## 2021-04-20 DIAGNOSIS — M5430 Sciatica, unspecified side: Secondary | ICD-10-CM | POA: Diagnosis not present

## 2021-04-20 DIAGNOSIS — E785 Hyperlipidemia, unspecified: Secondary | ICD-10-CM | POA: Diagnosis not present

## 2021-04-20 DIAGNOSIS — Z4801 Encounter for change or removal of surgical wound dressing: Secondary | ICD-10-CM | POA: Diagnosis not present

## 2021-04-20 DIAGNOSIS — J45909 Unspecified asthma, uncomplicated: Secondary | ICD-10-CM | POA: Diagnosis not present

## 2021-04-20 DIAGNOSIS — K5900 Constipation, unspecified: Secondary | ICD-10-CM | POA: Diagnosis not present

## 2021-04-20 DIAGNOSIS — Z48815 Encounter for surgical aftercare following surgery on the digestive system: Secondary | ICD-10-CM | POA: Diagnosis not present

## 2021-04-20 DIAGNOSIS — M1990 Unspecified osteoarthritis, unspecified site: Secondary | ICD-10-CM | POA: Diagnosis not present

## 2021-04-20 DIAGNOSIS — Z981 Arthrodesis status: Secondary | ICD-10-CM | POA: Diagnosis not present

## 2021-04-21 DIAGNOSIS — E1140 Type 2 diabetes mellitus with diabetic neuropathy, unspecified: Secondary | ICD-10-CM | POA: Diagnosis not present

## 2021-04-21 DIAGNOSIS — K219 Gastro-esophageal reflux disease without esophagitis: Secondary | ICD-10-CM | POA: Diagnosis not present

## 2021-04-21 DIAGNOSIS — Z981 Arthrodesis status: Secondary | ICD-10-CM | POA: Diagnosis not present

## 2021-04-21 DIAGNOSIS — E785 Hyperlipidemia, unspecified: Secondary | ICD-10-CM | POA: Diagnosis not present

## 2021-04-21 DIAGNOSIS — I10 Essential (primary) hypertension: Secondary | ICD-10-CM | POA: Diagnosis not present

## 2021-04-21 DIAGNOSIS — M1990 Unspecified osteoarthritis, unspecified site: Secondary | ICD-10-CM | POA: Diagnosis not present

## 2021-04-21 DIAGNOSIS — K5900 Constipation, unspecified: Secondary | ICD-10-CM | POA: Diagnosis not present

## 2021-04-21 DIAGNOSIS — Z4801 Encounter for change or removal of surgical wound dressing: Secondary | ICD-10-CM | POA: Diagnosis not present

## 2021-04-21 DIAGNOSIS — Z48815 Encounter for surgical aftercare following surgery on the digestive system: Secondary | ICD-10-CM | POA: Diagnosis not present

## 2021-04-21 DIAGNOSIS — J45909 Unspecified asthma, uncomplicated: Secondary | ICD-10-CM | POA: Diagnosis not present

## 2021-04-21 DIAGNOSIS — M5430 Sciatica, unspecified side: Secondary | ICD-10-CM | POA: Diagnosis not present

## 2021-04-22 DIAGNOSIS — Z981 Arthrodesis status: Secondary | ICD-10-CM | POA: Diagnosis not present

## 2021-04-22 DIAGNOSIS — E785 Hyperlipidemia, unspecified: Secondary | ICD-10-CM | POA: Diagnosis not present

## 2021-04-22 DIAGNOSIS — J45909 Unspecified asthma, uncomplicated: Secondary | ICD-10-CM | POA: Diagnosis not present

## 2021-04-22 DIAGNOSIS — E1140 Type 2 diabetes mellitus with diabetic neuropathy, unspecified: Secondary | ICD-10-CM | POA: Diagnosis not present

## 2021-04-22 DIAGNOSIS — K219 Gastro-esophageal reflux disease without esophagitis: Secondary | ICD-10-CM | POA: Diagnosis not present

## 2021-04-22 DIAGNOSIS — I10 Essential (primary) hypertension: Secondary | ICD-10-CM | POA: Diagnosis not present

## 2021-04-22 DIAGNOSIS — Z48815 Encounter for surgical aftercare following surgery on the digestive system: Secondary | ICD-10-CM | POA: Diagnosis not present

## 2021-04-22 DIAGNOSIS — Z4801 Encounter for change or removal of surgical wound dressing: Secondary | ICD-10-CM | POA: Diagnosis not present

## 2021-04-22 DIAGNOSIS — M5430 Sciatica, unspecified side: Secondary | ICD-10-CM | POA: Diagnosis not present

## 2021-04-22 DIAGNOSIS — M1990 Unspecified osteoarthritis, unspecified site: Secondary | ICD-10-CM | POA: Diagnosis not present

## 2021-04-22 DIAGNOSIS — K5900 Constipation, unspecified: Secondary | ICD-10-CM | POA: Diagnosis not present

## 2021-04-25 DIAGNOSIS — E1140 Type 2 diabetes mellitus with diabetic neuropathy, unspecified: Secondary | ICD-10-CM | POA: Diagnosis not present

## 2021-04-25 DIAGNOSIS — E785 Hyperlipidemia, unspecified: Secondary | ICD-10-CM | POA: Diagnosis not present

## 2021-04-25 DIAGNOSIS — Z981 Arthrodesis status: Secondary | ICD-10-CM | POA: Diagnosis not present

## 2021-04-25 DIAGNOSIS — M5430 Sciatica, unspecified side: Secondary | ICD-10-CM | POA: Diagnosis not present

## 2021-04-25 DIAGNOSIS — Z48815 Encounter for surgical aftercare following surgery on the digestive system: Secondary | ICD-10-CM | POA: Diagnosis not present

## 2021-04-25 DIAGNOSIS — K5900 Constipation, unspecified: Secondary | ICD-10-CM | POA: Diagnosis not present

## 2021-04-25 DIAGNOSIS — Z4801 Encounter for change or removal of surgical wound dressing: Secondary | ICD-10-CM | POA: Diagnosis not present

## 2021-04-25 DIAGNOSIS — K219 Gastro-esophageal reflux disease without esophagitis: Secondary | ICD-10-CM | POA: Diagnosis not present

## 2021-04-25 DIAGNOSIS — J45909 Unspecified asthma, uncomplicated: Secondary | ICD-10-CM | POA: Diagnosis not present

## 2021-04-25 DIAGNOSIS — I10 Essential (primary) hypertension: Secondary | ICD-10-CM | POA: Diagnosis not present

## 2021-04-25 DIAGNOSIS — M1990 Unspecified osteoarthritis, unspecified site: Secondary | ICD-10-CM | POA: Diagnosis not present

## 2021-04-27 DIAGNOSIS — M5430 Sciatica, unspecified side: Secondary | ICD-10-CM | POA: Diagnosis not present

## 2021-04-27 DIAGNOSIS — M1990 Unspecified osteoarthritis, unspecified site: Secondary | ICD-10-CM | POA: Diagnosis not present

## 2021-04-27 DIAGNOSIS — Z4801 Encounter for change or removal of surgical wound dressing: Secondary | ICD-10-CM | POA: Diagnosis not present

## 2021-04-27 DIAGNOSIS — Z981 Arthrodesis status: Secondary | ICD-10-CM | POA: Diagnosis not present

## 2021-04-27 DIAGNOSIS — I10 Essential (primary) hypertension: Secondary | ICD-10-CM | POA: Diagnosis not present

## 2021-04-27 DIAGNOSIS — Z48815 Encounter for surgical aftercare following surgery on the digestive system: Secondary | ICD-10-CM | POA: Diagnosis not present

## 2021-04-27 DIAGNOSIS — J45909 Unspecified asthma, uncomplicated: Secondary | ICD-10-CM | POA: Diagnosis not present

## 2021-04-27 DIAGNOSIS — E1140 Type 2 diabetes mellitus with diabetic neuropathy, unspecified: Secondary | ICD-10-CM | POA: Diagnosis not present

## 2021-04-27 DIAGNOSIS — K5900 Constipation, unspecified: Secondary | ICD-10-CM | POA: Diagnosis not present

## 2021-04-27 DIAGNOSIS — E785 Hyperlipidemia, unspecified: Secondary | ICD-10-CM | POA: Diagnosis not present

## 2021-04-27 DIAGNOSIS — K219 Gastro-esophageal reflux disease without esophagitis: Secondary | ICD-10-CM | POA: Diagnosis not present

## 2021-04-29 DIAGNOSIS — Z48815 Encounter for surgical aftercare following surgery on the digestive system: Secondary | ICD-10-CM | POA: Diagnosis not present

## 2021-04-29 DIAGNOSIS — I10 Essential (primary) hypertension: Secondary | ICD-10-CM | POA: Diagnosis not present

## 2021-04-29 DIAGNOSIS — Z4801 Encounter for change or removal of surgical wound dressing: Secondary | ICD-10-CM | POA: Diagnosis not present

## 2021-04-29 DIAGNOSIS — J45909 Unspecified asthma, uncomplicated: Secondary | ICD-10-CM | POA: Diagnosis not present

## 2021-04-29 DIAGNOSIS — E1140 Type 2 diabetes mellitus with diabetic neuropathy, unspecified: Secondary | ICD-10-CM | POA: Diagnosis not present

## 2021-04-29 DIAGNOSIS — E785 Hyperlipidemia, unspecified: Secondary | ICD-10-CM | POA: Diagnosis not present

## 2021-04-29 DIAGNOSIS — K5900 Constipation, unspecified: Secondary | ICD-10-CM | POA: Diagnosis not present

## 2021-04-29 DIAGNOSIS — M1990 Unspecified osteoarthritis, unspecified site: Secondary | ICD-10-CM | POA: Diagnosis not present

## 2021-04-29 DIAGNOSIS — Z981 Arthrodesis status: Secondary | ICD-10-CM | POA: Diagnosis not present

## 2021-04-29 DIAGNOSIS — M5430 Sciatica, unspecified side: Secondary | ICD-10-CM | POA: Diagnosis not present

## 2021-04-29 DIAGNOSIS — K219 Gastro-esophageal reflux disease without esophagitis: Secondary | ICD-10-CM | POA: Diagnosis not present

## 2021-05-01 ENCOUNTER — Other Ambulatory Visit: Payer: Self-pay | Admitting: Specialist

## 2021-05-01 DIAGNOSIS — E1169 Type 2 diabetes mellitus with other specified complication: Secondary | ICD-10-CM | POA: Diagnosis not present

## 2021-05-01 DIAGNOSIS — S31109A Unspecified open wound of abdominal wall, unspecified quadrant without penetration into peritoneal cavity, initial encounter: Secondary | ICD-10-CM | POA: Diagnosis not present

## 2021-05-01 DIAGNOSIS — I10 Essential (primary) hypertension: Secondary | ICD-10-CM | POA: Diagnosis not present

## 2021-05-01 NOTE — Telephone Encounter (Signed)
Please advise 

## 2021-05-02 DIAGNOSIS — E1140 Type 2 diabetes mellitus with diabetic neuropathy, unspecified: Secondary | ICD-10-CM | POA: Diagnosis not present

## 2021-05-02 DIAGNOSIS — K219 Gastro-esophageal reflux disease without esophagitis: Secondary | ICD-10-CM | POA: Diagnosis not present

## 2021-05-02 DIAGNOSIS — Z981 Arthrodesis status: Secondary | ICD-10-CM | POA: Diagnosis not present

## 2021-05-02 DIAGNOSIS — Z48815 Encounter for surgical aftercare following surgery on the digestive system: Secondary | ICD-10-CM | POA: Diagnosis not present

## 2021-05-02 DIAGNOSIS — K5900 Constipation, unspecified: Secondary | ICD-10-CM | POA: Diagnosis not present

## 2021-05-02 DIAGNOSIS — J45909 Unspecified asthma, uncomplicated: Secondary | ICD-10-CM | POA: Diagnosis not present

## 2021-05-02 DIAGNOSIS — Z4801 Encounter for change or removal of surgical wound dressing: Secondary | ICD-10-CM | POA: Diagnosis not present

## 2021-05-02 DIAGNOSIS — E785 Hyperlipidemia, unspecified: Secondary | ICD-10-CM | POA: Diagnosis not present

## 2021-05-02 DIAGNOSIS — M5430 Sciatica, unspecified side: Secondary | ICD-10-CM | POA: Diagnosis not present

## 2021-05-02 DIAGNOSIS — I10 Essential (primary) hypertension: Secondary | ICD-10-CM | POA: Diagnosis not present

## 2021-05-02 DIAGNOSIS — M1990 Unspecified osteoarthritis, unspecified site: Secondary | ICD-10-CM | POA: Diagnosis not present

## 2021-05-03 DIAGNOSIS — I10 Essential (primary) hypertension: Secondary | ICD-10-CM | POA: Diagnosis not present

## 2021-05-03 DIAGNOSIS — K219 Gastro-esophageal reflux disease without esophagitis: Secondary | ICD-10-CM | POA: Diagnosis not present

## 2021-05-03 DIAGNOSIS — J45909 Unspecified asthma, uncomplicated: Secondary | ICD-10-CM | POA: Diagnosis not present

## 2021-05-03 DIAGNOSIS — E1140 Type 2 diabetes mellitus with diabetic neuropathy, unspecified: Secondary | ICD-10-CM | POA: Diagnosis not present

## 2021-05-03 DIAGNOSIS — Z48815 Encounter for surgical aftercare following surgery on the digestive system: Secondary | ICD-10-CM | POA: Diagnosis not present

## 2021-05-03 DIAGNOSIS — Z4801 Encounter for change or removal of surgical wound dressing: Secondary | ICD-10-CM | POA: Diagnosis not present

## 2021-05-03 DIAGNOSIS — M5430 Sciatica, unspecified side: Secondary | ICD-10-CM | POA: Diagnosis not present

## 2021-05-03 DIAGNOSIS — M1990 Unspecified osteoarthritis, unspecified site: Secondary | ICD-10-CM | POA: Diagnosis not present

## 2021-05-03 DIAGNOSIS — K5900 Constipation, unspecified: Secondary | ICD-10-CM | POA: Diagnosis not present

## 2021-05-03 DIAGNOSIS — Z981 Arthrodesis status: Secondary | ICD-10-CM | POA: Diagnosis not present

## 2021-05-03 DIAGNOSIS — E785 Hyperlipidemia, unspecified: Secondary | ICD-10-CM | POA: Diagnosis not present

## 2021-05-04 DIAGNOSIS — M5430 Sciatica, unspecified side: Secondary | ICD-10-CM | POA: Diagnosis not present

## 2021-05-04 DIAGNOSIS — J45909 Unspecified asthma, uncomplicated: Secondary | ICD-10-CM | POA: Diagnosis not present

## 2021-05-04 DIAGNOSIS — K219 Gastro-esophageal reflux disease without esophagitis: Secondary | ICD-10-CM | POA: Diagnosis not present

## 2021-05-04 DIAGNOSIS — I10 Essential (primary) hypertension: Secondary | ICD-10-CM | POA: Diagnosis not present

## 2021-05-04 DIAGNOSIS — E785 Hyperlipidemia, unspecified: Secondary | ICD-10-CM | POA: Diagnosis not present

## 2021-05-04 DIAGNOSIS — M1990 Unspecified osteoarthritis, unspecified site: Secondary | ICD-10-CM | POA: Diagnosis not present

## 2021-05-04 DIAGNOSIS — Z981 Arthrodesis status: Secondary | ICD-10-CM | POA: Diagnosis not present

## 2021-05-04 DIAGNOSIS — Z4801 Encounter for change or removal of surgical wound dressing: Secondary | ICD-10-CM | POA: Diagnosis not present

## 2021-05-04 DIAGNOSIS — Z48815 Encounter for surgical aftercare following surgery on the digestive system: Secondary | ICD-10-CM | POA: Diagnosis not present

## 2021-05-04 DIAGNOSIS — E1140 Type 2 diabetes mellitus with diabetic neuropathy, unspecified: Secondary | ICD-10-CM | POA: Diagnosis not present

## 2021-05-04 DIAGNOSIS — K5900 Constipation, unspecified: Secondary | ICD-10-CM | POA: Diagnosis not present

## 2021-05-05 DIAGNOSIS — E1140 Type 2 diabetes mellitus with diabetic neuropathy, unspecified: Secondary | ICD-10-CM | POA: Diagnosis not present

## 2021-05-05 DIAGNOSIS — Z981 Arthrodesis status: Secondary | ICD-10-CM | POA: Diagnosis not present

## 2021-05-05 DIAGNOSIS — Z48815 Encounter for surgical aftercare following surgery on the digestive system: Secondary | ICD-10-CM | POA: Diagnosis not present

## 2021-05-05 DIAGNOSIS — M5430 Sciatica, unspecified side: Secondary | ICD-10-CM | POA: Diagnosis not present

## 2021-05-05 DIAGNOSIS — K5900 Constipation, unspecified: Secondary | ICD-10-CM | POA: Diagnosis not present

## 2021-05-05 DIAGNOSIS — E785 Hyperlipidemia, unspecified: Secondary | ICD-10-CM | POA: Diagnosis not present

## 2021-05-05 DIAGNOSIS — Z4801 Encounter for change or removal of surgical wound dressing: Secondary | ICD-10-CM | POA: Diagnosis not present

## 2021-05-05 DIAGNOSIS — J45909 Unspecified asthma, uncomplicated: Secondary | ICD-10-CM | POA: Diagnosis not present

## 2021-05-05 DIAGNOSIS — M1990 Unspecified osteoarthritis, unspecified site: Secondary | ICD-10-CM | POA: Diagnosis not present

## 2021-05-05 DIAGNOSIS — I10 Essential (primary) hypertension: Secondary | ICD-10-CM | POA: Diagnosis not present

## 2021-05-05 DIAGNOSIS — K219 Gastro-esophageal reflux disease without esophagitis: Secondary | ICD-10-CM | POA: Diagnosis not present

## 2021-05-06 DIAGNOSIS — E785 Hyperlipidemia, unspecified: Secondary | ICD-10-CM | POA: Diagnosis not present

## 2021-05-06 DIAGNOSIS — Z48815 Encounter for surgical aftercare following surgery on the digestive system: Secondary | ICD-10-CM | POA: Diagnosis not present

## 2021-05-06 DIAGNOSIS — M5430 Sciatica, unspecified side: Secondary | ICD-10-CM | POA: Diagnosis not present

## 2021-05-06 DIAGNOSIS — M1990 Unspecified osteoarthritis, unspecified site: Secondary | ICD-10-CM | POA: Diagnosis not present

## 2021-05-06 DIAGNOSIS — K219 Gastro-esophageal reflux disease without esophagitis: Secondary | ICD-10-CM | POA: Diagnosis not present

## 2021-05-06 DIAGNOSIS — Z4801 Encounter for change or removal of surgical wound dressing: Secondary | ICD-10-CM | POA: Diagnosis not present

## 2021-05-06 DIAGNOSIS — K5900 Constipation, unspecified: Secondary | ICD-10-CM | POA: Diagnosis not present

## 2021-05-06 DIAGNOSIS — E1140 Type 2 diabetes mellitus with diabetic neuropathy, unspecified: Secondary | ICD-10-CM | POA: Diagnosis not present

## 2021-05-06 DIAGNOSIS — I10 Essential (primary) hypertension: Secondary | ICD-10-CM | POA: Diagnosis not present

## 2021-05-06 DIAGNOSIS — J45909 Unspecified asthma, uncomplicated: Secondary | ICD-10-CM | POA: Diagnosis not present

## 2021-05-06 DIAGNOSIS — Z981 Arthrodesis status: Secondary | ICD-10-CM | POA: Diagnosis not present

## 2021-05-08 DIAGNOSIS — E1140 Type 2 diabetes mellitus with diabetic neuropathy, unspecified: Secondary | ICD-10-CM | POA: Diagnosis not present

## 2021-05-08 DIAGNOSIS — E785 Hyperlipidemia, unspecified: Secondary | ICD-10-CM | POA: Diagnosis not present

## 2021-05-08 DIAGNOSIS — J45909 Unspecified asthma, uncomplicated: Secondary | ICD-10-CM | POA: Diagnosis not present

## 2021-05-08 DIAGNOSIS — M5430 Sciatica, unspecified side: Secondary | ICD-10-CM | POA: Diagnosis not present

## 2021-05-08 DIAGNOSIS — Z48815 Encounter for surgical aftercare following surgery on the digestive system: Secondary | ICD-10-CM | POA: Diagnosis not present

## 2021-05-08 DIAGNOSIS — Z4801 Encounter for change or removal of surgical wound dressing: Secondary | ICD-10-CM | POA: Diagnosis not present

## 2021-05-08 DIAGNOSIS — M1990 Unspecified osteoarthritis, unspecified site: Secondary | ICD-10-CM | POA: Diagnosis not present

## 2021-05-08 DIAGNOSIS — K5900 Constipation, unspecified: Secondary | ICD-10-CM | POA: Diagnosis not present

## 2021-05-08 DIAGNOSIS — Z981 Arthrodesis status: Secondary | ICD-10-CM | POA: Diagnosis not present

## 2021-05-08 DIAGNOSIS — K219 Gastro-esophageal reflux disease without esophagitis: Secondary | ICD-10-CM | POA: Diagnosis not present

## 2021-05-08 DIAGNOSIS — I10 Essential (primary) hypertension: Secondary | ICD-10-CM | POA: Diagnosis not present

## 2021-05-11 ENCOUNTER — Other Ambulatory Visit: Payer: Self-pay

## 2021-05-11 ENCOUNTER — Ambulatory Visit (INDEPENDENT_AMBULATORY_CARE_PROVIDER_SITE_OTHER): Payer: Medicare Other | Admitting: General Surgery

## 2021-05-11 ENCOUNTER — Encounter: Payer: Self-pay | Admitting: General Surgery

## 2021-05-11 VITALS — BP 106/96 | HR 84 | Temp 99.3°F | Resp 14 | Ht 65.0 in | Wt 177.0 lb

## 2021-05-11 DIAGNOSIS — K5900 Constipation, unspecified: Secondary | ICD-10-CM | POA: Diagnosis not present

## 2021-05-11 DIAGNOSIS — M5430 Sciatica, unspecified side: Secondary | ICD-10-CM | POA: Diagnosis not present

## 2021-05-11 DIAGNOSIS — Z4801 Encounter for change or removal of surgical wound dressing: Secondary | ICD-10-CM | POA: Diagnosis not present

## 2021-05-11 DIAGNOSIS — Z48815 Encounter for surgical aftercare following surgery on the digestive system: Secondary | ICD-10-CM | POA: Diagnosis not present

## 2021-05-11 DIAGNOSIS — Z981 Arthrodesis status: Secondary | ICD-10-CM | POA: Diagnosis not present

## 2021-05-11 DIAGNOSIS — E1140 Type 2 diabetes mellitus with diabetic neuropathy, unspecified: Secondary | ICD-10-CM | POA: Diagnosis not present

## 2021-05-11 DIAGNOSIS — M1990 Unspecified osteoarthritis, unspecified site: Secondary | ICD-10-CM | POA: Diagnosis not present

## 2021-05-11 DIAGNOSIS — K439 Ventral hernia without obstruction or gangrene: Secondary | ICD-10-CM

## 2021-05-11 DIAGNOSIS — E785 Hyperlipidemia, unspecified: Secondary | ICD-10-CM | POA: Diagnosis not present

## 2021-05-11 DIAGNOSIS — K219 Gastro-esophageal reflux disease without esophagitis: Secondary | ICD-10-CM | POA: Diagnosis not present

## 2021-05-11 DIAGNOSIS — J45909 Unspecified asthma, uncomplicated: Secondary | ICD-10-CM | POA: Diagnosis not present

## 2021-05-11 DIAGNOSIS — I10 Essential (primary) hypertension: Secondary | ICD-10-CM | POA: Diagnosis not present

## 2021-05-11 NOTE — Progress Notes (Signed)
[facility]   Lower part of midline wound being packed. 1 cm size defect and 1 cm deep, granulation, repacked. Minor induration of wound. No erythema or drainage.  She is eating and having regular Bms.   BP (!) 106/96   Pulse 84   Temp 99.3 F (37.4 C) (Other (Comment))   Resp 14   Ht 5\' 5"  (1.651 m)   Wt 177 lb (80.3 kg)   SpO2 92%   BMI 29.45 kg/m   Continue packing wound can cover with bandaid.  If gets to be flat then do not need to pack any more.  Will check on in 1 month.  Future Appointments  Date Time Provider Department Center  06/08/2021  1:00 PM 06/10/2021, MD [facility] None   Lucretia Roers, MD [facility] [address] [place], [place] [phone] (office)

## 2021-05-11 NOTE — Patient Instructions (Signed)
Continue packing wound can cover with bandaid. If gets to be flat then do not need to pack any more. Doing a great job.

## 2021-05-12 DIAGNOSIS — I10 Essential (primary) hypertension: Secondary | ICD-10-CM | POA: Diagnosis not present

## 2021-05-12 DIAGNOSIS — Z48815 Encounter for surgical aftercare following surgery on the digestive system: Secondary | ICD-10-CM | POA: Diagnosis not present

## 2021-05-12 DIAGNOSIS — K219 Gastro-esophageal reflux disease without esophagitis: Secondary | ICD-10-CM | POA: Diagnosis not present

## 2021-05-12 DIAGNOSIS — M1990 Unspecified osteoarthritis, unspecified site: Secondary | ICD-10-CM | POA: Diagnosis not present

## 2021-05-12 DIAGNOSIS — M5430 Sciatica, unspecified side: Secondary | ICD-10-CM | POA: Diagnosis not present

## 2021-05-12 DIAGNOSIS — E1140 Type 2 diabetes mellitus with diabetic neuropathy, unspecified: Secondary | ICD-10-CM | POA: Diagnosis not present

## 2021-05-12 DIAGNOSIS — Z4801 Encounter for change or removal of surgical wound dressing: Secondary | ICD-10-CM | POA: Diagnosis not present

## 2021-05-12 DIAGNOSIS — E785 Hyperlipidemia, unspecified: Secondary | ICD-10-CM | POA: Diagnosis not present

## 2021-05-12 DIAGNOSIS — K5900 Constipation, unspecified: Secondary | ICD-10-CM | POA: Diagnosis not present

## 2021-05-12 DIAGNOSIS — J45909 Unspecified asthma, uncomplicated: Secondary | ICD-10-CM | POA: Diagnosis not present

## 2021-05-12 DIAGNOSIS — Z981 Arthrodesis status: Secondary | ICD-10-CM | POA: Diagnosis not present

## 2021-05-14 DIAGNOSIS — E785 Hyperlipidemia, unspecified: Secondary | ICD-10-CM | POA: Diagnosis not present

## 2021-05-14 DIAGNOSIS — I10 Essential (primary) hypertension: Secondary | ICD-10-CM | POA: Diagnosis not present

## 2021-05-14 DIAGNOSIS — E1165 Type 2 diabetes mellitus with hyperglycemia: Secondary | ICD-10-CM | POA: Diagnosis not present

## 2021-05-14 DIAGNOSIS — Z794 Long term (current) use of insulin: Secondary | ICD-10-CM | POA: Diagnosis not present

## 2021-05-19 DIAGNOSIS — Z48815 Encounter for surgical aftercare following surgery on the digestive system: Secondary | ICD-10-CM | POA: Diagnosis not present

## 2021-05-19 DIAGNOSIS — Z4801 Encounter for change or removal of surgical wound dressing: Secondary | ICD-10-CM | POA: Diagnosis not present

## 2021-05-19 DIAGNOSIS — M1990 Unspecified osteoarthritis, unspecified site: Secondary | ICD-10-CM | POA: Diagnosis not present

## 2021-05-19 DIAGNOSIS — I10 Essential (primary) hypertension: Secondary | ICD-10-CM | POA: Diagnosis not present

## 2021-05-19 DIAGNOSIS — M5430 Sciatica, unspecified side: Secondary | ICD-10-CM | POA: Diagnosis not present

## 2021-05-19 DIAGNOSIS — E1140 Type 2 diabetes mellitus with diabetic neuropathy, unspecified: Secondary | ICD-10-CM | POA: Diagnosis not present

## 2021-05-19 DIAGNOSIS — J45909 Unspecified asthma, uncomplicated: Secondary | ICD-10-CM | POA: Diagnosis not present

## 2021-05-19 DIAGNOSIS — K219 Gastro-esophageal reflux disease without esophagitis: Secondary | ICD-10-CM | POA: Diagnosis not present

## 2021-05-19 DIAGNOSIS — K5900 Constipation, unspecified: Secondary | ICD-10-CM | POA: Diagnosis not present

## 2021-05-19 DIAGNOSIS — E785 Hyperlipidemia, unspecified: Secondary | ICD-10-CM | POA: Diagnosis not present

## 2021-05-19 DIAGNOSIS — Z981 Arthrodesis status: Secondary | ICD-10-CM | POA: Diagnosis not present

## 2021-06-01 DIAGNOSIS — J45909 Unspecified asthma, uncomplicated: Secondary | ICD-10-CM | POA: Diagnosis not present

## 2021-06-01 DIAGNOSIS — Z4801 Encounter for change or removal of surgical wound dressing: Secondary | ICD-10-CM | POA: Diagnosis not present

## 2021-06-01 DIAGNOSIS — Z48815 Encounter for surgical aftercare following surgery on the digestive system: Secondary | ICD-10-CM | POA: Diagnosis not present

## 2021-06-01 DIAGNOSIS — I10 Essential (primary) hypertension: Secondary | ICD-10-CM | POA: Diagnosis not present

## 2021-06-01 DIAGNOSIS — E1140 Type 2 diabetes mellitus with diabetic neuropathy, unspecified: Secondary | ICD-10-CM | POA: Diagnosis not present

## 2021-06-01 DIAGNOSIS — E785 Hyperlipidemia, unspecified: Secondary | ICD-10-CM | POA: Diagnosis not present

## 2021-06-08 ENCOUNTER — Ambulatory Visit (INDEPENDENT_AMBULATORY_CARE_PROVIDER_SITE_OTHER): Payer: Medicare Other | Admitting: General Surgery

## 2021-06-08 ENCOUNTER — Other Ambulatory Visit: Payer: Self-pay

## 2021-06-08 VITALS — BP 103/68 | HR 85 | Temp 98.9°F | Resp 12 | Ht 65.0 in | Wt 181.0 lb

## 2021-06-08 DIAGNOSIS — Q433 Congenital malformations of intestinal fixation: Secondary | ICD-10-CM

## 2021-06-08 NOTE — Patient Instructions (Signed)
Activity and diet as tolerated.    

## 2021-06-12 NOTE — Progress Notes (Signed)
[facility]  Midline wound is all healed. Eating and having regular Bms. Overall happy.   BP 103/68   Pulse 85   Temp 98.9 F (37.2 C) (Other (Comment))   Resp 12   Ht 5\' 5"  (1.651 m)   Wt 181 lb (82.1 kg)   SpO2 92%   BMI 30.12 kg/m  Healed midline after packing. No hernia noted Minor induration on right side of wound  Patient s/p Ex lap, ladd's ventral hernia repair with mesh.  Activity and diet as tolerated.  Patient wants me to check on her next year, told her to call the office for an appt at that time.  , MD [facility] [address] [place], [place] [phone] (office)

## 2021-06-14 DIAGNOSIS — E785 Hyperlipidemia, unspecified: Secondary | ICD-10-CM | POA: Diagnosis not present

## 2021-06-14 DIAGNOSIS — Z794 Long term (current) use of insulin: Secondary | ICD-10-CM | POA: Diagnosis not present

## 2021-06-14 DIAGNOSIS — E1165 Type 2 diabetes mellitus with hyperglycemia: Secondary | ICD-10-CM | POA: Diagnosis not present

## 2021-06-14 DIAGNOSIS — I10 Essential (primary) hypertension: Secondary | ICD-10-CM | POA: Diagnosis not present

## 2021-07-14 DIAGNOSIS — I10 Essential (primary) hypertension: Secondary | ICD-10-CM | POA: Diagnosis not present

## 2021-07-14 DIAGNOSIS — Z794 Long term (current) use of insulin: Secondary | ICD-10-CM | POA: Diagnosis not present

## 2021-07-14 DIAGNOSIS — E1165 Type 2 diabetes mellitus with hyperglycemia: Secondary | ICD-10-CM | POA: Diagnosis not present

## 2021-07-14 DIAGNOSIS — E785 Hyperlipidemia, unspecified: Secondary | ICD-10-CM | POA: Diagnosis not present

## 2021-07-25 NOTE — Progress Notes (Signed)
Cardiology Office Note   Date:  07/26/2021   ID:  [name], DOB 1951/09/04, MRN [id number]  PCP:  Mirna Mires, MD  Cardiologist:  Was Dr. Lonn Georgia     Chief Complaint  Patient presents with   Follow-up   Chest Pain    [facility] of chest for 1 week        History of Present Illness: [name] is a 70 y.o. female who presents for follow up of hx chest pain.   History of mild carotid artery disease (followed by [facility]), HTN, HLD (followed in primary care), DM, asthma, migraines, varicose veins with mild chronic edema, arthritis who presents for follow-up.    She has history of coronary CTA in 05/2019 showing no significant CAD or incidental findings, small secundum ASD noted. She recently saw GI and reported inspirational chest pain so CTA was ordered 02/11/20 and was negative for PE or acute abnormalities. She was recommended to take ibuprofen. She was subsequently admitted 02/22/20 with worsening SOB and continued intermittent chest pain, more with exertion and recumbency. hsTroponins were normal, BNP normal, and Covid was negative. LE venous duplex showed no DVT. There was no obvious cardiac etiology for her chest pain. She had an echo performed 02/23/20 to evaluate for the reported ASD on study last year. This showed EF 60-65%, mild LVH, normal RV, no significant valvular disease, bubble study positive within 3-6 cardiac cycles suggestive of intraatrial shunt, right to left shunt is small at rest, moderate following the Valsalva maneuver. This was not felt significantly different than prior study.    Since last clinic visit, reports intermittent chest pain, occur about once per week and lasting for several minutes.  Occurs at rest.  Feels sharp pain across whole chest.  Pain resolved with nitroglycerin.  No change in pain over the last several years.  She denies any dyspnea, lightheadedness, syncope, lower extreme edema, or palpitations.  She walks daily for 2 hours.   Denies chest pain while walking.  Past Medical History:  Diagnosis Date   Asthma    Bronchitis    Diabetes mellitus    Hyperlipidemia    Hypertension    Interatrial cardiac shunt    a. small secundum ASD vs pulmonary shunt.   Low back pain    Migraine    Mild carotid artery disease (HCC)    Osteoarthritis    Sciatica    Varicose veins    Vertigo     Past Surgical History:  Procedure Laterality Date   ABDOMINAL HYSTERECTOMY     BIOPSY  05/12/2019   Procedure: BIOPSY;  Surgeon: West Bali, MD;  Location: [facility] SUITE;  Service: Endoscopy;;   CARPAL TUNNEL RELEASE Bilateral    CESAREAN SECTION     ESOPHAGOGASTRODUODENOSCOPY (EGD) WITH PROPOFOL N/A 05/12/2019   Procedure: ESOPHAGOGASTRODUODENOSCOPY (EGD) WITH PROPOFOL;  Surgeon: West Bali, MD;  normal-appearing esophagus s/p empiric dilation, moderate gastritis due to ASA s/p biopsy, normal examined duodenum.  Biopsies with gastritis.    INCISIONAL HERNIA REPAIR N/A 03/15/2021   Procedure: Sherald Hess HERNIORRHAPHY WITH MESH;  Surgeon: Lucretia Roers, MD;  Location: [facility] ORS;  Service: General;  Laterality: N/A;   INGUINAL HERNIA REPAIR Left    LAPAROTOMY N/A 03/15/2021   Procedure: EXPLORATORY LAPAROTOMY;  Surgeon: Lucretia Roers, MD;  Location: [facility] ORS;  Service: General;  Laterality: N/A;   LUMBAR FUSION     LYSIS OF ADHESION N/A 03/15/2021   Procedure: LYSIS OF ADHESION;  Surgeon: Lucretia Roers, MD;  Location: [facility] ORS;  Service: General;  Laterality: N/A;   POSTERIOR LUMBAR FUSION 4 LEVEL N/A 03/04/2018   Procedure: Right transforaminal lumbar interbody fusion L1-2, L2-3 Posterior fusion T10, T11, T12, L1, L2 with T 11, T12 pedicle screws, superior sublaminar hooks T10, local bone graft, allograft cancellous chips Vivigen;  Surgeon: Kerrin Champagne, MD;  Location: [facility] OR;  Service: Orthopedics;  Laterality: N/A;   SAVORY DILATION N/A 05/12/2019   Procedure: SAVORY DILATION;  Surgeon: West Bali, MD;  Location: [facility]  ENDO SUITE;  Service: Endoscopy;  Laterality: N/A;     Current Outpatient Medications  Medication Sig Dispense Refill   acetaminophen (TYLENOL) 325 MG tablet Take 2 tablets (650 mg total) by mouth every 6 (six) hours as needed for mild pain (or Fever >/= 101). 30 tablet 2   amLODipine (NORVASC) 10 MG tablet Take 1 tablet by mouth daily.     aspirin EC 81 MG tablet Take 1 tablet (81 mg total) by mouth daily with breakfast. 30 tablet 5   atorvastatin (LIPITOR) 20 MG tablet Take 1 tablet (20 mg total) by mouth daily with supper. 90 tablet 3   fluorometholone (FML) 0.1 % ophthalmic suspension 1 drop 3 (three) times daily.     gabapentin (NEURONTIN) 100 MG capsule Take 100 mg by mouth daily as needed (pain).     HUMULIN 70/30 (70-30) 100 UNIT/ML injection Inject 45 Units into the skin 2 (two) times daily with a meal. 10 mL 11   hydrochlorothiazide (MICROZIDE) 12.5 MG capsule Take 1 capsule (12.5 mg total) by mouth daily. 30 capsule 0   isosorbide mononitrate (IMDUR) 30 MG 24 hr tablet Take 0.5 tablets (15 mg total) by mouth daily. 45 tablet 3   losartan (COZAAR) 50 MG tablet Take 50 mg by mouth daily.  4   metFORMIN (GLUCOPHAGE) 1000 MG tablet Take 1,000 mg by mouth at bedtime.     ondansetron (ZOFRAN) 4 MG tablet Take 1 tablet (4 mg total) by mouth every 6 (six) hours as needed for nausea. 20 tablet 0   pantoprazole (PROTONIX) 40 MG tablet Take 1 tablet (40 mg total) by mouth daily. 30 tablet 1   polyethylene glycol (MIRALAX / GLYCOLAX) 17 g packet Take 17 g by mouth daily. 30 each 1   RYBELSUS 14 MG TABS Take 1 tablet by mouth daily.     senna-docusate (SENOKOT-S) 8.6-50 MG tablet Take 2 tablets by mouth at bedtime. 60 tablet 11   nitroGLYCERIN (NITROSTAT) 0.4 MG SL tablet Place 1 tablet (0.4 mg total) under the tongue every 5 (five) minutes as needed for chest pain. Dissolve one tablet under the tongue every 5 mintues as needed for chest pain. 25 tablet 2   No current facility-administered  medications for this visit.    Allergies:   Patient has no known allergies.    Social History:  The patient  reports that she has never smoked. She has never used smokeless tobacco. She reports that she does not drink alcohol and does not use drugs.   Family History:  The patient's family history includes Breast cancer in her mother; Diabetes in her sister and sister; Heart failure in her sister and sister; Lung cancer in her mother; Stroke in her father.    ROS:  General:no colds or fevers, no weight changes Skin:no rashes or ulcers HEENT:no blurred vision, no congestion CV:see HPI PUL:see HPI GI:no diarrhea constipation or melena, no indigestion GU:no hematuria, + dysuria and  difficulty passing urine. MS:no joint pain, no claudication Neuro:no syncope, no lightheadedness Endo:+ diabetes, no thyroid disease  Wt Readings from Last 3 Encounters:  07/26/21 181 lb 3.2 oz (82.2 kg)  06/08/21 181 lb (82.1 kg)  05/11/21 177 lb (80.3 kg)     PHYSICAL EXAM: VS:  BP 122/68   Ht 5\' 5"  (1.651 m)   Wt 181 lb 3.2 oz (82.2 kg)   SpO2 98%   BMI 30.15 kg/m  , BMI Body mass index is 30.15 kg/m. General:Pleasant affect, NAD Skin:Warm and dry, brisk capillary refill HEENT:normocephalic, sclera clear, mucus membranes moist Neck:supple, no JVD, + b/l bruits Heart:S1S2 RRR, 2/6 systolic murmur Lungs:clear without rales, rhonchi, or wheezes , non tender, + BS, do not palpate liver spleen or masses Ext:no lower ext edema, 2+ pedal pulses, 2+ radial pulses Neuro:alert and oriented X 3, MAE, follows commands, + facial symmetry    EKG:  EKG today shows sinus rhythm, rate 80, no ST abnormalities    Recent Labs: 02/14/2021: B Natriuretic Peptide 18.0 03/14/2021: TSH 1.530 03/16/2021: Magnesium 1.6 03/18/2021: ALT 32 03/20/2021: BUN 12; Creatinine, Ser 0.70; Hemoglobin 9.8; Platelets 230; Potassium 3.9; Sodium 140    Lipid Panel    Component Value Date/Time   CHOL 92 03/14/2021 0623    TRIG 170 (H) 03/14/2021 0623   HDL 20 (L) 03/14/2021 0623   CHOLHDL 4.6 03/14/2021 0623   VLDL 34 03/14/2021 0623   LDLCALC 38 03/14/2021 03/16/2021       Other studies Reviewed: Additional studies/ records that were reviewed today include:  . 2D Echo 02/2020 1. Left ventricular ejection fraction, by estimation, is 60 to 65%. The  left ventricle has normal function. The left ventricle has no regional  wall motion abnormalities. There is mild concentric left ventricular  hypertrophy. Left ventricular diastolic  parameters were normal.   2. Right ventricular systolic function is normal. The right ventricular  size is normal. Tricuspid regurgitation signal is inadequate for assessing  PA pressure.   3. The mitral valve is normal in structure. No evidence of mitral valve  regurgitation. No evidence of mitral stenosis.   4. The aortic valve is normal in structure. Aortic valve regurgitation is  not visualized. No aortic stenosis is present.   5. The inferior vena cava is normal in size with greater than 50%  respiratory variability, suggesting right atrial pressure of 3 mmHg.   6. Agitated saline contrast bubble study was positive with shunting  observed within 3-6 cardiac cycles suggestive of interatrial shunt. The  right to left shunt is small at rest, moderate following the Valsalva  maneuver.   Comparison(s): No significant change from prior study.   Cor CT 05/2019  ADDENDUM REPORT: 06/11/2019 11:36   CLINICAL DATA:  65F with dyspnea and atypical chest pain.   EXAM: Cardiac/Coronary  CT   TECHNIQUE: The patient was scanned on a 06/13/2019.   FINDINGS: A 120 kV prospective scan was triggered in the descending thoracic aorta at 111 HU's. Axial non-contrast 3 mm slices were carried out through the heart. The data set was analyzed on a dedicated work station and scored using the Agatson method. Gantry rotation speed was 250 msecs and collimation was .6 mm. No beta  blockade and 0.8 mg of sl NTG was given. The 3D data set was reconstructed in 5% intervals of the 67-82 % of the R-R cycle. Diastolic phases were analyzed on a dedicated work station using MPR, MIP and VRT modes. The patient  received 80 cc of contrast.   Aorta: Normal size. Ascending aorta 3.3 cm. No calcifications. No dissection.   Aortic Valve: Trileaflet. Minimal calcification of the aortic annulus.   Coronary Arteries:  Normal coronary origin.  Right dominance.   RCA is a dominant artery that gives rise to PDA and PLVB. There is minimal soft attenuation plaque distally.   Left main is a large artery that gives rise to LAD, LCX, and RI arteries.   LAD is a large vessel that has no plaque. There is a small D1 without plaque.   LCX is a small, non-dominant artery.   There is no plaque.   Other findings:   Normal pulmonary vein drainage into the left atrium.   Normal let atrial appendage without a thrombus.   Small secundum ASD noted.  7.20mm x 3.2 mm.   Normal size of the pulmonary artery.   IMPRESSION: 1. Coronary calcium score of 0. This was 0 percentile for age and sex matched control.   2. Normal coronary origin with right dominance.   3. No evidence of CAD.   4.  Small secundum ASD noted.   Chilton Si, MD       ASSESSMENT AND PLAN:  Chest pain: Her cardiac CTA in 2020 was normal, no CAD.  She continues to have intermittent chest pain, unchanged from when she had her CTA done.  Pain does improve with nitroglycerin.  Could represent microvascular disease.  Given improvement with nitroglycerin, will schedule Imdur 15 mg daily  Carotid artery stenosis: 1 to 39% bilateral stenosis on duplex 01/07/2020.  Follows with [facility]  DOE now improved.  Follows with pulmonary.  Abnormal echo with intraatrial shunt, likely PFO.    HTN: Continue losartan 50 mg daily, amlodipine 10 mg daily, HCTZ 12.5 mg daily.  BP well controlled.  DM-2 per IM  Hyperlipidemia:  Continue simvastatin 20 mg daily.  LDL 104 on 05/01/2021.  Given her carotid stenosis, recommend goal LDL less than 70.  Will change to atorvastatin 20 mg daily   RTC in 3 months  Signed, Little Ishikawa, MD  07/26/2021 2:45 PM

## 2021-07-26 ENCOUNTER — Ambulatory Visit: Payer: Medicare Other | Admitting: Cardiology

## 2021-07-26 ENCOUNTER — Other Ambulatory Visit: Payer: Self-pay

## 2021-07-26 VITALS — BP 122/68 | Ht 65.0 in | Wt 181.2 lb

## 2021-07-26 DIAGNOSIS — I6523 Occlusion and stenosis of bilateral carotid arteries: Secondary | ICD-10-CM | POA: Diagnosis not present

## 2021-07-26 DIAGNOSIS — I10 Essential (primary) hypertension: Secondary | ICD-10-CM

## 2021-07-26 DIAGNOSIS — R079 Chest pain, unspecified: Secondary | ICD-10-CM

## 2021-07-26 DIAGNOSIS — E785 Hyperlipidemia, unspecified: Secondary | ICD-10-CM

## 2021-07-26 MED ORDER — ATORVASTATIN CALCIUM 20 MG PO TABS: 20.0000 mg | ORAL_TABLET | Freq: Every day | ORAL | 3 refills | Status: DC

## 2021-07-26 MED ORDER — NITROGLYCERIN 0.4 MG SL SUBL: 0.4000 mg | SUBLINGUAL_TABLET | SUBLINGUAL | 2 refills | Status: DC | PRN

## 2021-07-26 MED ORDER — ISOSORBIDE MONONITRATE ER 30 MG PO TB24: 15.0000 mg | ORAL_TABLET | Freq: Every day | ORAL | 3 refills | Status: DC

## 2021-07-26 NOTE — Patient Instructions (Signed)
Medication Instructions:  STOP Simvastatin  START Atorvastatin 20 mg daily at dinner  START Imdur 15 mg daily    *If you need a refill on your cardiac medications before your next appointment, please call your pharmacy*   Lab Work: None today  If you have labs (blood work) drawn today and your tests are completely normal, you will receive your results only by: MyChart Message (if you have MyChart) OR A paper copy in the mail If you have any lab test that is abnormal or we need to change your treatment, we will call you to review the results.   Testing/Procedures: None today    Follow-Up: At [facility], you and your health needs are our priority.  As part of our continuing mission to provide you with exceptional heart care, we have created designated Provider Care Teams.  These Care Teams include your primary Cardiologist (physician) and Advanced Practice Providers (APPs -  Physician Assistants and Nurse Practitioners) who all work together to provide you with the care you need, when you need it.  We recommend signing up for the patient portal called "MyChart".  Sign up information is provided on this After Visit Summary.  MyChart is used to connect with patients for Virtual Visits (Telemedicine).  Patients are able to view lab/test results, encounter notes, upcoming appointments, etc.  Non-urgent messages can be sent to your provider as well.   To learn more about what you can do with MyChart, go to [email or web address].    Your next appointment:   3 month(s)  The format for your next appointment:   In Person  Provider:   Dr.Schumann    Other Instructions None

## 2021-07-31 DIAGNOSIS — R21 Rash and other nonspecific skin eruption: Secondary | ICD-10-CM | POA: Diagnosis not present

## 2021-07-31 DIAGNOSIS — I10 Essential (primary) hypertension: Secondary | ICD-10-CM | POA: Diagnosis not present

## 2021-07-31 DIAGNOSIS — E1169 Type 2 diabetes mellitus with other specified complication: Secondary | ICD-10-CM | POA: Diagnosis not present

## 2021-07-31 DIAGNOSIS — E782 Mixed hyperlipidemia: Secondary | ICD-10-CM | POA: Diagnosis not present

## 2021-08-14 ENCOUNTER — Ambulatory Visit: Payer: Medicare Other | Admitting: Physician Assistant

## 2021-08-14 ENCOUNTER — Ambulatory Visit (HOSPITAL_COMMUNITY)
Admission: RE | Admit: 2021-08-14 | Discharge: 2021-08-14 | Disposition: A | Discharge status: Home / Self Care | Payer: Medicare Other | Source: Ambulatory Visit | Attending: Surgery | Admitting: Surgery

## 2021-08-14 ENCOUNTER — Other Ambulatory Visit: Payer: Self-pay

## 2021-08-14 VITALS — BP 123/65 | HR 80 | Temp 98.4°F | Resp 20 | Ht 65.0 in | Wt 181.1 lb

## 2021-08-14 DIAGNOSIS — I6523 Occlusion and stenosis of bilateral carotid arteries: Secondary | ICD-10-CM

## 2021-08-14 NOTE — Progress Notes (Signed)
HISTORY AND PHYSICAL     CC:  follow up. Requesting Provider:  Mirna Mires, MD  HPI: This is a 70 y.o. female here for follow up for carotid artery stenosis.    She has no history of CVA or TIA and no prior intervention to either carotid artery.  She had a carotid duplex in 04/2017 which demonstrated moderate velocities in left ICA however these have not been replicated on ultrasound since that study was performed.    Pt returns today for follow up.  She states that earlier this year, she had a hernia rupture and she became really sick.  She says she is still having some issues but much better.   Pt denies any amaurosis fugax, speech difficulties, weakness, numbness, paralysis or clumsiness or facial droop.   She states she was supposed to have cataract surgery, but just couldn't go through with it after her issues above.   The pt is on a statin for cholesterol management.  The pt is on a daily aspirin.   Other AC:  none The pt is on CCB, ARB, HCTZ for hypertension.   The pt is diabetic.   Tobacco hx:  never   Past Medical History:  Diagnosis Date   Asthma    Bronchitis    Diabetes mellitus    Hyperlipidemia    Hypertension    Interatrial cardiac shunt    a. small secundum ASD vs pulmonary shunt.   Low back pain    Migraine    Mild carotid artery disease (HCC)    Osteoarthritis    Sciatica    Varicose veins    Vertigo     Past Surgical History:  Procedure Laterality Date   ABDOMINAL HYSTERECTOMY     BIOPSY  05/12/2019   Procedure: BIOPSY;  Surgeon: West Bali, MD;  Location: [facility];  Service: Endoscopy;;   CARPAL TUNNEL RELEASE Bilateral    CESAREAN SECTION     ESOPHAGOGASTRODUODENOSCOPY (EGD) WITH PROPOFOL N/A 05/12/2019   Procedure: ESOPHAGOGASTRODUODENOSCOPY (EGD) WITH PROPOFOL;  Surgeon: West Bali, MD;  normal-appearing esophagus s/p empiric dilation, moderate gastritis due to ASA s/p biopsy, normal examined duodenum.  Biopsies with gastritis.     INCISIONAL HERNIA REPAIR N/A 03/15/2021   Procedure: Sherald Hess HERNIORRHAPHY WITH MESH;  Surgeon: Lucretia Roers, MD;  Location: [facility];  Service: General;  Laterality: N/A;   INGUINAL HERNIA REPAIR Left    LAPAROTOMY N/A 03/15/2021   Procedure: EXPLORATORY LAPAROTOMY;  Surgeon: Lucretia Roers, MD;  Location: [facility];  Service: General;  Laterality: N/A;   LUMBAR FUSION     LYSIS OF ADHESION N/A 03/15/2021   Procedure: LYSIS OF ADHESION;  Surgeon: Lucretia Roers, MD;  Location: [facility];  Service: General;  Laterality: N/A;   POSTERIOR LUMBAR FUSION 4 LEVEL N/A 03/04/2018   Procedure: Right transforaminal lumbar interbody fusion L1-2, L2-3 Posterior fusion T10, T11, T12, L1, L2 with T 11, T12 pedicle screws, superior sublaminar hooks T10, local bone graft, allograft cancellous chips Vivigen;  Surgeon: Kerrin Champagne, MD;  Location: [facility];  Service: Orthopedics;  Laterality: N/A;   SAVORY DILATION N/A 05/12/2019   Procedure: SAVORY DILATION;  Surgeon: West Bali, MD;  Location: [facility];  Service: Endoscopy;  Laterality: N/A;    No Known Allergies  Current Outpatient Medications  Medication Sig Dispense Refill   acetaminophen (TYLENOL) 325 MG tablet Take 2 tablets (650 mg total) by mouth every 6 (six) hours as needed for mild  pain (or Fever >/= 101). 30 tablet 2   amLODipine (NORVASC) 10 MG tablet Take 1 tablet by mouth daily.     aspirin EC 81 MG tablet Take 1 tablet (81 mg total) by mouth daily with breakfast. 30 tablet 5   atorvastatin (LIPITOR) 20 MG tablet Take 1 tablet (20 mg total) by mouth daily with supper. 90 tablet 3   fluorometholone (FML) 0.1 % ophthalmic suspension 1 drop 3 (three) times daily.     gabapentin (NEURONTIN) 100 MG capsule Take 100 mg by mouth daily as needed (pain).     HUMULIN 70/30 (70-30) 100 UNIT/ML injection Inject 45 Units into the skin 2 (two) times daily with a meal. 10 mL 11   hydrochlorothiazide (MICROZIDE) 12.5 MG capsule Take 1 capsule  (12.5 mg total) by mouth daily. 30 capsule 0   isosorbide mononitrate (IMDUR) 30 MG 24 hr tablet Take 0.5 tablets (15 mg total) by mouth daily. 45 tablet 3   losartan (COZAAR) 50 MG tablet Take 50 mg by mouth daily.  4   metFORMIN (GLUCOPHAGE) 1000 MG tablet Take 1,000 mg by mouth at bedtime.     nitroGLYCERIN (NITROSTAT) 0.4 MG SL tablet Place 1 tablet (0.4 mg total) under the tongue every 5 (five) minutes as needed for chest pain. Dissolve one tablet under the tongue every 5 mintues as needed for chest pain. 25 tablet 2   ondansetron (ZOFRAN) 4 MG tablet Take 1 tablet (4 mg total) by mouth every 6 (six) hours as needed for nausea. 20 tablet 0   pantoprazole (PROTONIX) 40 MG tablet Take 1 tablet (40 mg total) by mouth daily. 30 tablet 1   polyethylene glycol (MIRALAX / GLYCOLAX) 17 g packet Take 17 g by mouth daily. 30 each 1   RYBELSUS 14 MG TABS Take 1 tablet by mouth daily.     senna-docusate (SENOKOT-S) 8.6-50 MG tablet Take 2 tablets by mouth at bedtime. 60 tablet 11   Alcohol Swabs (ALCOHOL PREP) 70 % PADS      AquaLance Lancets 30G MISC      methocarbamol (ROBAXIN) 500 MG tablet Take 500 mg by mouth every 8 (eight) hours as needed.     SURE COMFORT INSULIN SYRINGE 31G X 5/16" 1 ML MISC      triamcinolone ointment (KENALOG) 0.1 % Apply topically 2 (two) times daily.     No current facility-administered medications for this visit.    Family History  Problem Relation Age of Onset   Lung cancer Mother    Breast cancer Mother    Stroke Father    Heart failure Sister    Diabetes Sister    Heart failure Sister    Diabetes Sister    Colon cancer Neg Hx    Gastric cancer Neg Hx    Esophageal cancer Neg Hx     Social History   Socioeconomic History   Marital status: Single    Spouse name: Not on file   Number of children: Not on file   Years of education: 12th    Highest education level: Not on file  Occupational History   Occupation: Disabled    Employer: UNEMPLOYED   Tobacco Use   Smoking status: Never   Smokeless tobacco: Never  Vaping Use   Vaping Use: Never used  Substance and Sexual Activity   Alcohol use: No    Alcohol/week: 0.0 standard drinks   Drug use: No   Sexual activity: Yes    Birth control/protection: Surgical  Other  Topics Concern   Not on file  Social History Narrative   No caffeine use. 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 2 CHILDREN: 1 IN [place], 1 IN [employer] CURRENTLY STATIONED IN [place].   Social Determinants of Health   Financial Resource Strain: Not on file  Food Insecurity: Not on file  Transportation Needs: Not on file  Physical Activity: Not on file  Stress: Not on file  Social Connections: Not on file  Intimate Partner Violence: Not on file     REVIEW OF SYSTEMS:   [X]  denotes positive finding, [ ]  denotes negative finding Cardiac  Comments:  Chest pain or chest pressure:    Shortness of breath upon exertion:    Short of breath when lying flat:    Irregular heart rhythm:        Vascular    Pain in calf, thigh, or hip brought on by ambulation:    Pain in feet at night that wakes you up from your sleep:     Blood clot in your veins:    Leg swelling:         Pulmonary    Oxygen at home:    Productive cough:     Wheezing:         Neurologic    Sudden weakness in arms or legs:     Sudden numbness in arms or legs:     Sudden onset of difficulty speaking or slurred speech:    Temporary loss of vision in one eye:     Problems with dizziness:         Gastrointestinal    Blood in stool:     Vomited blood:         Genitourinary    Burning when urinating:     Blood in urine:        Psychiatric    Major depression:         Hematologic    Bleeding problems:    Problems with blood clotting too easily:        Skin    Rashes or ulcers:        Constitutional    Fever or chills:      PHYSICAL EXAMINATION:  Today's Vitals   08/14/21 1051 08/14/21  1054  BP: 122/64 123/65  Pulse: 80   Resp: 20   Temp: 98.4 F (36.9 C)   TempSrc: Temporal   SpO2: 95%   Weight: 181 lb 1.6 oz (82.1 kg)   Height: 5\' 5"  (1.651 m)   PainSc: 0-No pain    Body mass index is 30.14 kg/m.   General:  WDWN in NAD; vital signs documented above Gait: Not observed HENT: WNL, normocephalic Pulmonary: normal non-labored breathing Cardiac: regular HR, without carotid bruits Abdomen: obese Skin: without rashes Vascular Exam/Pulses:  Right Left  Radial 2+ (normal) 2+ (normal)  DP 2+ (normal) 2+ (normal)   Extremities: without ischemic changes, without Gangrene , without cellulitis; without open wounds Musculoskeletal: no muscle wasting or atrophy  Neurologic: A&O X 3; moving all extremities equally ; speech is fluent/normal Psychiatric:  The pt has Normal affect.   Non-Invasive Vascular Imaging:   Carotid Duplex on 08/14/2021: Right:  1-39% ICA stenosis Left:  1-39% ICA stenosis   Previous Carotid duplex on 01/07/2020: Right: 1-39% ICA stenosis Left:   1-39% ICA stenosis    ASSESSMENT/PLAN:: 70 y.o. female here for follow up carotid artery stenosis   -duplex today reveals bilateral ICA stenosis of 1-39%.  She remains asymptomatic.  Her velocities were up slightly so she will return in 18 months for repeat u/s.   -discussed s/s of stroke with pt and she understands should she develop any of these sx, she will go to the nearest ER or call 911. -pt will call sooner should they have any issues. -continue statin/asa   Doreatha Massed, [facility] [phone]  Clinic MD:  Myra Gianotti

## 2021-09-13 DIAGNOSIS — Z794 Long term (current) use of insulin: Secondary | ICD-10-CM | POA: Diagnosis not present

## 2021-09-13 DIAGNOSIS — E785 Hyperlipidemia, unspecified: Secondary | ICD-10-CM | POA: Diagnosis not present

## 2021-09-13 DIAGNOSIS — E1165 Type 2 diabetes mellitus with hyperglycemia: Secondary | ICD-10-CM | POA: Diagnosis not present

## 2021-09-13 DIAGNOSIS — I10 Essential (primary) hypertension: Secondary | ICD-10-CM | POA: Diagnosis not present

## 2021-09-21 ENCOUNTER — Ambulatory Visit: Payer: Medicare Other | Admitting: General Surgery

## 2021-09-21 ENCOUNTER — Other Ambulatory Visit: Payer: Self-pay

## 2021-09-21 ENCOUNTER — Encounter: Payer: Self-pay | Admitting: General Surgery

## 2021-09-21 ENCOUNTER — Telehealth: Payer: Self-pay | Admitting: *Deleted

## 2021-09-21 VITALS — BP 122/74 | HR 90 | Temp 98.3°F | Resp 16 | Ht 65.0 in | Wt 178.0 lb

## 2021-09-21 DIAGNOSIS — R1030 Lower abdominal pain, unspecified: Secondary | ICD-10-CM | POA: Diagnosis not present

## 2021-09-21 DIAGNOSIS — K439 Ventral hernia without obstruction or gangrene: Secondary | ICD-10-CM

## 2021-09-21 DIAGNOSIS — R1084 Generalized abdominal pain: Secondary | ICD-10-CM

## 2021-09-21 NOTE — Progress Notes (Signed)
[facility] Note   HPI:  70 y.o. Female presents to clinic for follow-up evaluation of her abdomen. She is hurting on the right lower side of her incision after her ventral hernia repair with mesh. She says that there is hard and dark spot and she is worried about this. Continues to eat well and having more regular Bms since surgery.   Review of Systems:  Pain in the right lower aspect of her incision  All other review of systems: otherwise negative   Vital Signs:  BP 122/74   Pulse 90   Temp 98.3 F (36.8 C) (Other (Comment))   Resp 16   Ht 5\' 5"  (1.651 m)   Wt 178 lb (80.7 kg)   SpO2 95%   BMI 29.62 kg/m    Physical Exam:  Physical Exam Vitals reviewed.  Cardiovascular:     Rate and Rhythm: Normal rate and regular rhythm.  Pulmonary:     Effort: Pulmonary effort is normal.  Abdominal:     General: There is no distension.     Palpations: Abdomen is soft.     Tenderness: There is abdominal tenderness.     Comments: Incision healed midline, lower right abdomen right of midline with a darkened indurated circle of skin and hardness under the skin, no obvious hernia but difficult to palpate, tender over this area      Assessment:  70 y.o. yo Female with s/p ventral hernia repair with mesh that is having some more pain and a darkened/ indurated area of skin right of midline. Her original hernia was right of midline too. I am worried she could have a recurrence versus this is where she had a hematoma and it is just reactive.   Plan:  Will get CT ordered and will call with the results.   All of the above recommendations were discussed with the patient, and all of patient's questions were answered to herr expressed satisfaction.  66, MD [facility] [address] [place], [place] [phone] (office)

## 2021-09-21 NOTE — Telephone Encounter (Signed)
Call placed to patient and patient made aware.   Agreeable to plan.   Letter sent to patient with instructions.

## 2021-09-21 NOTE — Patient Instructions (Signed)
Will get CT ordered and will call with the results.

## 2021-09-21 NOTE — Telephone Encounter (Signed)
-----   Message from Durwin Nora Lindon Kiel, LPN sent at 40/12/4740  2:35 PM EST ----- Regarding: RE: ct a/p ordered r/o recurrence hernia 10/30/2021 @ 8:45am @ [facility]. ----- Message ----- From: Lucretia Roers, MD Sent: 09/21/2021   2:14 PM EST To: Ricard Dillon, RMA, Nahima Ales Rexene Edison Amelia Macken, LPN Subject: ct a/p ordered r/o recurrence hernia

## 2021-10-02 SURGERY — Surgical Case
Anesthesia: *Unknown

## 2021-10-10 ENCOUNTER — Encounter: Payer: Self-pay | Admitting: Internal Medicine

## 2021-10-12 ENCOUNTER — Telehealth: Payer: Self-pay | Admitting: Physical Medicine and Rehabilitation

## 2021-10-12 NOTE — Telephone Encounter (Signed)
Pt calling about arthritis pain in her back that goes down to her legs. Nitka pt , never seen [facility]ton. She would like to get an injection. OV?  CB [phone]

## 2021-10-20 ENCOUNTER — Ambulatory Visit: Payer: Medicare Other | Admitting: Cardiology

## 2021-10-30 ENCOUNTER — Ambulatory Visit (HOSPITAL_COMMUNITY)
Admission: RE | Admit: 2021-10-30 | Discharge: 2021-10-30 | Disposition: A | Discharge status: Home / Self Care | Payer: Medicare Other | Source: Ambulatory Visit | Attending: General Surgery | Admitting: General Surgery

## 2021-10-30 ENCOUNTER — Other Ambulatory Visit: Payer: Self-pay

## 2021-10-30 DIAGNOSIS — R1084 Generalized abdominal pain: Secondary | ICD-10-CM | POA: Insufficient documentation

## 2021-10-30 DIAGNOSIS — R109 Unspecified abdominal pain: Secondary | ICD-10-CM | POA: Diagnosis not present

## 2021-10-30 DIAGNOSIS — I700 Atherosclerosis of aorta: Secondary | ICD-10-CM | POA: Diagnosis not present

## 2021-10-30 LAB — POCT I-STAT CREATININE: Creatinine, Ser: 0.9 mg/dL (ref 0.44–1.00)

## 2021-10-30 MED ORDER — IOHEXOL 300 MG/ML  SOLN
100.0000 mL | Freq: Once | INTRAMUSCULAR | Status: AC | PRN
  Administered 2021-10-30: 100 mL via INTRAVENOUS

## 2021-10-31 ENCOUNTER — Telehealth (INDEPENDENT_AMBULATORY_CARE_PROVIDER_SITE_OTHER): Payer: Medicare Other | Admitting: General Surgery

## 2021-10-31 DIAGNOSIS — K439 Ventral hernia without obstruction or gangrene: Secondary | ICD-10-CM

## 2021-10-31 DIAGNOSIS — R1030 Lower abdominal pain, unspecified: Secondary | ICD-10-CM

## 2021-10-31 NOTE — Telephone Encounter (Signed)
[facility]  Reviewed the chart and the pain is "rough at times."   Told her that there is a recurrence on the right side with some fat. Told her that we can fix it if she wants. She says she wants to wait right now. Discussed incarceration symptoms and risk and reasons to go to ED.  She will call if she wants to get this repaired, and I think best option would be a local open repair with some additional mesh if needed given the large mesh placed last time and recurrence on the lateral edge.    CLINICAL DATA:  Postop lower abdominal pain after hernia repair.   EXAM: CT ABDOMEN AND PELVIS WITH CONTRAST   TECHNIQUE: Multidetector CT imaging of the abdomen and pelvis was performed using the standard protocol following bolus administration of intravenous contrast.   RADIATION DOSE REDUCTION: This exam was performed according to the departmental dose-optimization program which includes automated exposure control, adjustment of the mA and/or kV according to patient size and/or use of iterative reconstruction technique.   CONTRAST:  140mL OMNIPAQUE IOHEXOL 300 MG/ML  SOLN   COMPARISON:  03/12/2021.   FINDINGS: Lower chest: Minimal scarring in the lung bases. Heart is at the upper limits of normal in size. No pericardial or pleural effusion. Distal esophagus is grossly unremarkable.   Hepatobiliary: Tiny blushes of hyperattenuation in the liver dome (2/13-14), too small to characterize, likely perfusion anomalies. Subcentimeter faint low-attenuation lesion in the posterior dome of the liver (2/18), too small to characterize. Liver is otherwise unremarkable. Cholecystectomy. No unexpected biliary ductal dilatation.   Pancreas: Negative.   Spleen: Negative.   Adrenals/Urinary Tract: Adrenal glands and kidneys are unremarkable. Ureters are decompressed. Bladder is grossly unremarkable.   Stomach/Bowel: Stomach is unremarkable. Small bowel and colon appear non  rotated. Stool throughout the colon.   Vascular/Lymphatic: Atherosclerotic calcification of the aorta. No pathologically enlarged lymph nodes.   Reproductive: Hysterectomy.  No adnexal mass.   Other: Small bilateral inguinal hernias contain fat. Interval periumbilical hernia repair with persistent laxity of the ventral midline abdominal wall. Along the right lateral margin of the mesh is a small fat containing hernia. No free fluid. Mesenteries and peritoneum are otherwise unremarkable.   Musculoskeletal: Degenerative and postoperative changes in the spine. No worrisome lytic or sclerotic lesions.   IMPRESSION: 1. Previous periumbilical hernia repair with persistent laxity of the ventral abdominal wall. 2. Small fat containing hernia along the right lateral margin of the mesh. 3.  Aortic atherosclerosis (ICD10-I70.0).     Electronically Signed   By: Lorin Picket M.D.   On: 10/30/2021 11:09

## 2021-11-06 ENCOUNTER — Encounter: Payer: Self-pay | Admitting: Physical Medicine and Rehabilitation

## 2021-11-06 ENCOUNTER — Other Ambulatory Visit: Payer: Self-pay

## 2021-11-06 ENCOUNTER — Ambulatory Visit: Payer: Medicare Other | Admitting: Physical Medicine and Rehabilitation

## 2021-11-06 VITALS — BP 116/75 | HR 87

## 2021-11-06 DIAGNOSIS — G8929 Other chronic pain: Secondary | ICD-10-CM | POA: Diagnosis not present

## 2021-11-06 DIAGNOSIS — M461 Sacroiliitis, not elsewhere classified: Secondary | ICD-10-CM

## 2021-11-06 DIAGNOSIS — Z981 Arthrodesis status: Secondary | ICD-10-CM

## 2021-11-06 DIAGNOSIS — M5416 Radiculopathy, lumbar region: Secondary | ICD-10-CM

## 2021-11-06 DIAGNOSIS — M5441 Lumbago with sciatica, right side: Secondary | ICD-10-CM

## 2021-11-06 NOTE — Progress Notes (Signed)
Pt state lower back pain that travels down her right leg. Pt state sitting makes the pain worse. Pt state she uses pain cream and heat to help ease her pain.  Numeric Pain Rating Scale and Functional Assessment Average Pain 10 Pain Right Now 8 My pain is intermittent, sharp, burning, stabbing, and tingling Pain is worse with: sitting Pain improves with: heat/ice   In the last MONTH (on 0-10 scale) has pain interfered with the following?  1. General activity like being  able to carry out your everyday physical activities such as walking, climbing stairs, carrying groceries, or moving a chair?  Rating(7)  2. Relation with others like being able to carry out your usual social activities and roles such as  activities at home, at work and in your community. Rating(8)  3. Enjoyment of life such that you have  been bothered by emotional problems such as feeling anxious, depressed or irritable?  Rating(9)

## 2021-11-06 NOTE — Progress Notes (Signed)
[name] - 71 y.o. female MRN [id number]  Date of birth: 1951-09-03  Office Visit Note: Visit Date: 11/06/2021 PCP: Mirna Mires, MD Referred by: Mirna Mires, MD  Subjective: Chief Complaint  Patient presents with   Lower Back - Pain   Right Leg - Pain   HPI: [name] is a 71 y.o. female who comes in today as a self referral for evaluation of chronic, worsening and severe right sided lower back pain radiating to buttock and anterior thigh region, also reports intermittent radiation to right anterior leg and down to toes. Patient reports pain has been ongoing for several months, states her pain is exacerbated by prolonged walking/standing and activity. Patient describes pain as constant sore and aching sensation, currently rates as 8 out of 10. Patient reports some relief of pain with home exercise regimen, rest, use of Tylenol and topical pain creams and heating pad. Patient has a history of multiple lumbar surgical procedures by Dr. Joya San ultimately ending in fusion from T10-S1. Patients lumbar x-ray images from 2021 exhibit solid fusion of T10-S1 and right greater than left sacroiliac joint arthritis. Patient did have right sacroiliac joint injection by Dr. Lavada Mesi in 2021 and reports significant relief of pain with this procedure. Patient states her pain has become progressively worse over the last several months and is now affecting her ability to perform daily tasks. Patient currently using cane to assist with ambulation and prevent falls. Patient denies focal weakness, numbness and tingling. Patient denies recent trauma or falls.   Review of Systems  Musculoskeletal:  Positive for back pain.  Neurological:  Negative for tingling, sensory change, focal weakness and weakness.  All other systems reviewed and are negative. Otherwise per HPI.  Assessment & Plan: Visit Diagnoses:    ICD-10-CM   1. Sacroiliitis (HCC)  M46.1 Ambulatory referral to  Physical Medicine Rehab    2. Chronic right-sided low back pain with right-sided sciatica  M54.41 Ambulatory referral to Orthopedic Surgery   G89.29     3. Lumbar radiculopathy  M54.16 Ambulatory referral to Orthopedic Surgery    4. S/P lumbar fusion  Z98.1 Ambulatory referral to Orthopedic Surgery       Plan: Findings:  Chronic, worsening and severe right sided lower back pain radiating to buttock and anterior thigh. There is also intermittent radiation of pain right to anterior leg and down to toes. Patient continues to have severe pain despite good conservative therapies such as home exercise regimen, rest, use of heating pad, use of topical pain creams and Tylenol. Patients clinical presentation and exam are consistent with sacroiliitis, however the intermittent radiation of pain to anterior leg and down to toes could be nerve related. We feel the next step would be to perform a diagnostic and hopefully therapeutic right sacroiliac joint injection under fluoroscopic guidance. We also placed a referral for follow-up with Dr. [name] to discuss further treatment options such as new lumbar MRI imaging. Patient encouraged to remain active and to continue home exercise regimen as tolerated. No red flag symptoms noted upon exam today.    Meds & Orders: No orders of the defined types were placed in this encounter.   Orders Placed This Encounter  Procedures   Ambulatory referral to Physical Medicine Rehab   Ambulatory referral to Orthopedic Surgery    Follow-up: Return for Right SI joint injection.   Procedures: No procedures performed      Clinical History: MRI LUMBAR SPINE WITHOUT AND WITH CONTRAST  TECHNIQUE: Multiplanar and multiecho pulse sequences of the lumbar spine were obtained without and with intravenous contrast.   CONTRAST:  66mL MULTIHANCE GADOBENATE DIMEGLUMINE 529 MG/ML IV SOLN   COMPARISON:  Lumbar CT myelogram 10/13/2015   FINDINGS: Segmentation: Numbering as  on the prior myelogram designating the lowest fully open disc space L5-S1. Small ribs at L1.   Alignment: Grade 1 retrolisthesis of L1 on L2 and L2 on L3 and grade 1 anterolisthesis of L5 on S1, unchanged.   Vertebrae: No fracture or suspicious osseous lesion. Prominent type 3 degenerative endplate changes at X33443. Prior L3-S1 posterior and interbody fusion.   Conus medullaris: Extends to the upper L1 level and appears normal.   Paraspinal and other soft tissues: Postoperative changes in the posterior lumbar soft tissues. A chronic dorsal epidural/laminectomy bed fluid collection from L3-S1 measures 5.4 x 1.3 x 6.2 cm (transverse by AP by craniocaudal).   Disc levels:   T12-L1: Mild facet arthrosis without disc herniation or stenosis, unchanged   L1-2: Mild-to-moderate disc space narrowing. Mild disc bulging, small left paracentral disc protrusion (calcified on CT), and mild facet and ligamentum flavum hypertrophy without stenosis, unchanged.   L2-3: Retrolisthesis, disc bulging, spurring, mild ligamentum flavum thickening, and advanced facet hypertrophy result in mild to moderate bilateral lateral recess stenosis and mild bilateral neural foraminal stenosis, similar to the prior myelogram. A small central disc extrusion with mild superior migration is unchanged and does not contribute to significant stenosis.   L3-4:  Prior posterior decompression and fusion.  No stenosis.   L4-5:  Prior posterior decompression and fusion.  No stenosis.   L5-S1: Prior posterior decompression and fusion. No evidence of significant stenosis, with right neural foraminal assessment mildly limited by susceptibility artifact.   IMPRESSION: 1. L3-S1 fusion without stenosis. 2. Severe L2-3 disc degeneration. Unchanged lateral recess stenosis.     Electronically Signed   By: Logan Bores M.D.   On: 05/23/2017 14:20   She reports that she has never smoked. She has never used smokeless  tobacco.  Recent Labs    02/14/21 1148  HGBA1C 9.6*    Objective:  VS:  HT:     WT:    BMI:      BP:116/75   HR:87bpm   TEMP: ( )   RESP:  Physical Exam Vitals and nursing note reviewed.  HENT:     Head: Normocephalic and atraumatic.     Right Ear: External ear normal.     Left Ear: External ear normal.     Nose: Nose normal.     Mouth/Throat:     Mouth: Mucous membranes are moist.  Eyes:     Extraocular Movements: Extraocular movements intact.  Cardiovascular:     Rate and Rhythm: Normal rate.     Pulses: Normal pulses.  Pulmonary:     Effort: Pulmonary effort is normal.  Abdominal:     General: Abdomen is flat. There is no distension.  Musculoskeletal:        General: Tenderness present.     Cervical back: Normal range of motion.     Comments: Pt is slow to rise from seated position to standing. Good lumbar range of motion. Strong distal strength without clonus, no pain upon palpation of greater trochanters. Sensation intact bilaterally. Ambulates with cane, gait slow and unsteady. Positive Fortin finger sign on the right.     Skin:    General: Skin is warm and dry.     Capillary Refill: Capillary refill takes  less than 2 seconds.  Neurological:     Mental Status: She is alert and oriented to person, place, and time.     Gait: Gait abnormal.  Psychiatric:        Mood and Affect: Mood normal.        Behavior: Behavior normal.    Ortho Exam  Imaging: No results found.  Past Medical/Family/Surgical/Social History: Medications & Allergies reviewed per EMR, new medications updated. Patient Active Problem List   Diagnosis Date Noted   Ventral hernia without obstruction or gangrene 09/21/2021   Non-intractable vomiting    Lower abdominal pain 03/13/2021   Intractable abdominal pain 03/12/2021   Chest pain 02/14/2021   Incisional hernia, without obstruction or gangrene 12/08/2020   Malrotation of intestine 12/08/2020   GERD (gastroesophageal reflux disease)  09/22/2020   Gastritis 09/19/2020   Other chest pain 09/19/2020   Reducible bulge of abdominal wall 09/19/2020   Obesity 02/23/2020   Chest pain with moderate risk for cardiac etiology 02/22/2020   Dyspnea on exertion    Carotid artery stenosis 01/07/2020   Constipation 09/03/2019   Dysphagia 08/11/2018   Leg swelling 08/11/2018   Bilateral carotid bruits 08/11/2018   Abnormal EKG 08/06/2018   Acute blood loss as cause of postoperative anemia 03/07/2018    Class: Acute   Spinal stenosis, lumbar region with neurogenic claudication 03/04/2018    Class: Chronic   Multilevel degenerative disc disease 03/04/2018    Class: Chronic   Forestier's disease of thoracolumbar region 03/04/2018    Class: Chronic   Fusion of spine of thoracolumbar region 03/04/2018   Pain in right hip 03/18/2017   Stiffness of joint, not elsewhere classified, other specified site 08/04/2013   Leg weakness, bilateral 08/04/2013   Chest pain at rest 05/10/2009   BENIGN POSITIONAL VERTIGO 07/30/2007   VARICOSE VEINS, LOWER EXTREMITIES 07/30/2007   SCIATICA, CHRONIC 07/30/2007   Diabetes ([place]) 11/18/2006   Hyperlipidemia 11/18/2006   MIGRAINE HEADACHE 11/18/2006   LESION, SCIATIC NERVE 11/18/2006   Essential hypertension 11/18/2006   HYPOTENSION, ORTHOSTATIC 11/18/2006   BRONCHITIS NOS 11/18/2006   Other emphysema ([place]) 11/18/2006   ASTHMA 11/18/2006   OSTEOARTHRITIS 11/18/2006   LOW BACK PAIN 11/18/2006   VERTIGO 11/18/2006   Past Medical History:  Diagnosis Date   Asthma    Bronchitis    Diabetes mellitus    Hyperlipidemia    Hypertension    Interatrial cardiac shunt    a. small secundum ASD vs pulmonary shunt.   Low back pain    Migraine    Mild carotid artery disease (HCC)    Osteoarthritis    Sciatica    Varicose veins    Vertigo    Family History  Problem Relation Age of Onset   Lung cancer Mother    Breast cancer Mother    Stroke Father    Heart failure Sister    Diabetes Sister     Heart failure Sister    Diabetes Sister    Colon cancer Neg Hx    Gastric cancer Neg Hx    Esophageal cancer Neg Hx    Past Surgical History:  Procedure Laterality Date   ABDOMINAL HYSTERECTOMY     BIOPSY  05/12/2019   Procedure: BIOPSY;  Surgeon: Danie Binder, MD;  Location: [facility];  Service: Endoscopy;;   CARPAL TUNNEL RELEASE Bilateral    CESAREAN SECTION     ESOPHAGOGASTRODUODENOSCOPY (EGD) WITH PROPOFOL N/A 05/12/2019   Procedure: ESOPHAGOGASTRODUODENOSCOPY (EGD) WITH PROPOFOL;  Surgeon:  Fields, Marga Melnick, MD;  normal-appearing esophagus s/p empiric dilation, moderate gastritis due to ASA s/p biopsy, normal examined duodenum.  Biopsies with gastritis.    INCISIONAL HERNIA REPAIR N/A 03/15/2021   Procedure: Fatima Blank HERNIORRHAPHY WITH MESH;  Surgeon: Virl Cagey, MD;  Location: [facility];  Service: General;  Laterality: N/A;   INGUINAL HERNIA REPAIR Left    LAPAROTOMY N/A 03/15/2021   Procedure: EXPLORATORY LAPAROTOMY;  Surgeon: Virl Cagey, MD;  Location: [facility];  Service: General;  Laterality: N/A;   LUMBAR FUSION     LYSIS OF ADHESION N/A 03/15/2021   Procedure: LYSIS OF ADHESION;  Surgeon: Virl Cagey, MD;  Location: [facility];  Service: General;  Laterality: N/A;   POSTERIOR LUMBAR FUSION 4 LEVEL N/A 03/04/2018   Procedure: Right transforaminal lumbar interbody fusion L1-2, L2-3 Posterior fusion T10, T11, T12, L1, L2 with T 11, T12 pedicle screws, superior sublaminar hooks T10, local bone graft, allograft cancellous chips Vivigen;  Surgeon: Jessy Oto, MD;  Location: [facility];  Service: Orthopedics;  Laterality: N/A;   SAVORY DILATION N/A 05/12/2019   Procedure: SAVORY DILATION;  Surgeon: Danie Binder, MD;  Location: [facility];  Service: Endoscopy;  Laterality: N/A;   Social History   Occupational History   Occupation: Disabled    Employer: UNEMPLOYED  Tobacco Use   Smoking status: Never   Smokeless tobacco: Never  Vaping Use   Vaping Use: Never  used  Substance and Sexual Activity   Alcohol use: No    Alcohol/week: 0.0 standard drinks   Drug use: No   Sexual activity: Yes    Birth control/protection: Surgical

## 2021-11-09 ENCOUNTER — Other Ambulatory Visit: Payer: Self-pay

## 2021-11-09 ENCOUNTER — Ambulatory Visit: Payer: Self-pay

## 2021-11-09 ENCOUNTER — Encounter: Payer: Self-pay | Admitting: Physical Medicine and Rehabilitation

## 2021-11-09 ENCOUNTER — Ambulatory Visit: Payer: Medicare Other | Admitting: Physical Medicine and Rehabilitation

## 2021-11-09 DIAGNOSIS — M461 Sacroiliitis, not elsewhere classified: Secondary | ICD-10-CM

## 2021-11-09 NOTE — Patient Instructions (Signed)

## 2021-11-09 NOTE — Progress Notes (Signed)
[name] - 71 y.o. female MRN [id number]  Date of birth: 1950-11-14  Office Visit Note: Visit Date: 11/09/2021 PCP: Mirna Mires, MD Referred by: Mirna Mires, MD  Subjective: Chief Complaint  Patient presents with   Lower Back - Pain   HPI:  [name] is a 71 y.o. female who comes in today at the request of Ellin Goodie, FNP for planned Right anesthetic sacroiliac joint arthrogram with fluoroscopic guidance.  The patient has failed conservative care including home exercise, medications, time and activity modification.  This injection will be diagnostic and hopefully therapeutic.  Please see requesting physician notes for further details and justification.   ROS Otherwise per HPI.  Assessment & Plan: Visit Diagnoses:    ICD-10-CM   1. Sacroiliitis (HCC)  M46.1 XR C-ARM NO REPORT      Plan: No additional findings.   Meds & Orders: No orders of the defined types were placed in this encounter.   Orders Placed This Encounter  Procedures   Sacroiliac Joint Inj   XR C-ARM NO REPORT    Follow-up: Return for Review Pain Diary.   Procedures: Sacroiliac Joint Inj on 11/09/2021 2:30 PM Indications: pain and diagnostic evaluation Details: 22 G 3.5 in needle, fluoroscopy-guided posterior approach Medications: 2 mL bupivacaine 0.5 %; 80 mg methylPREDNISolone acetate 80 MG/ML Outcome: tolerated well, no immediate complications  Sacroiliac Joint Intra-Articular Injection - Posterior Approach with Fluoroscopic Guidance   Position: PRONE  Additional Comments: Vital signs were monitored before and after the procedure. Patient was prepped and draped in the usual sterile fashion. The correct patient, procedure, and site was verified.   Injection Procedure Details:   Location/Site:  Sacroiliac joint  Needle size: 3.5 in Spinal Needle  Needle type: Spinal  Needle Placement: Intra-articular  Findings:  -Comments: There was excellent flow of contrast  producing a partial arthrogram of the sacroiliac joint.   Procedure Details: Starting with a 90 degree vertical and midline orientation the fluoroscope was tilted cranially 20 to 25 degrees and the target area of the inferior most part of the SI joint on the side mentioned above was visualized.  The soft tissues overlying this target were infiltrated with 4 ml. of 1% Lidocaine without Epinephrine. A #22 gauge spinal needle was inserted perpendicular to the fluoroscope table and advanced into the posterior inferior joint space using fluoroscopic guidance.  Position in the joint space was confirmed by obtaining a partial arthrogram using a 2 ml. volume of Isovue-250 contrast agent. After negative aspirate for gross pus or blood, the injectate was delivered to the joint. Radiographs were obtained for documentation purposes.   Additional Comments:   Dressing: Bandaid    Post-procedure details: Patient was observed during the procedure. Post-procedure instructions were reviewed.  Patient left the clinic in stable condition.    There was excellent flow of contrast producing a partial arthrogram of the sacroiliac joint.  Procedure, treatment alternatives, risks and benefits explained, specific risks discussed. Consent was given by the patient. Immediately prior to procedure a time out was called to verify the correct patient, procedure, equipment, support staff and site/side marked as required. Patient was prepped and draped in the usual sterile fashion.         Clinical History: MRI LUMBAR SPINE WITHOUT AND WITH CONTRAST   TECHNIQUE: Multiplanar and multiecho pulse sequences of the lumbar spine were obtained without and with intravenous contrast.   CONTRAST:  69mL MULTIHANCE GADOBENATE DIMEGLUMINE 529 MG/ML IV SOLN   COMPARISON:  Lumbar CT myelogram 10/13/2015   FINDINGS: Segmentation: Numbering as on the prior myelogram designating the lowest fully open disc space L5-S1. Small ribs  at L1.   Alignment: Grade 1 retrolisthesis of L1 on L2 and L2 on L3 and grade 1 anterolisthesis of L5 on S1, unchanged.   Vertebrae: No fracture or suspicious osseous lesion. Prominent type 3 degenerative endplate changes at X33443. Prior L3-S1 posterior and interbody fusion.   Conus medullaris: Extends to the upper L1 level and appears normal.   Paraspinal and other soft tissues: Postoperative changes in the posterior lumbar soft tissues. A chronic dorsal epidural/laminectomy bed fluid collection from L3-S1 measures 5.4 x 1.3 x 6.2 cm (transverse by AP by craniocaudal).   Disc levels:   T12-L1: Mild facet arthrosis without disc herniation or stenosis, unchanged   L1-2: Mild-to-moderate disc space narrowing. Mild disc bulging, small left paracentral disc protrusion (calcified on CT), and mild facet and ligamentum flavum hypertrophy without stenosis, unchanged.   L2-3: Retrolisthesis, disc bulging, spurring, mild ligamentum flavum thickening, and advanced facet hypertrophy result in mild to moderate bilateral lateral recess stenosis and mild bilateral neural foraminal stenosis, similar to the prior myelogram. A small central disc extrusion with mild superior migration is unchanged and does not contribute to significant stenosis.   L3-4:  Prior posterior decompression and fusion.  No stenosis.   L4-5:  Prior posterior decompression and fusion.  No stenosis.   L5-S1: Prior posterior decompression and fusion. No evidence of significant stenosis, with right neural foraminal assessment mildly limited by susceptibility artifact.   IMPRESSION: 1. L3-S1 fusion without stenosis. 2. Severe L2-3 disc degeneration. Unchanged lateral recess stenosis.     Electronically Signed   By: Logan Bores M.D.   On: 05/23/2017 14:20     Objective:  VS:  HT:     WT:    BMI:      BP:    HR: bpm   TEMP: ( )   RESP:  Physical Exam Musculoskeletal:     Comments: Positive Fortin finger sign,  Patrick's testing and lateral compression test.       Imaging: XR C-ARM NO REPORT  Result Date: 11/09/2021 Please see Notes tab for imaging impression.

## 2021-11-09 NOTE — Progress Notes (Signed)
Pt state lower back pain. Pt state walking and bending makes the pain worse. Pt state she uses heating to help ease her pain.  Numeric Pain Rating Scale and Functional Assessment Average Pain 8   In the last MONTH (on 0-10 scale) has pain interfered with the following?  1. General activity like being  able to carry out your everyday physical activities such as walking, climbing stairs, carrying groceries, or moving a chair?  Rating(10)   +Driver, -BT, -Dye Allergies.

## 2021-11-10 MED ORDER — BUPIVACAINE HCL 0.5 % IJ SOLN
2.0000 mL | INTRAMUSCULAR | Status: AC | PRN
  Administered 2021-11-09: 2 mL via INTRA_ARTICULAR

## 2021-11-10 MED ORDER — METHYLPREDNISOLONE ACETATE 80 MG/ML IJ SUSP
80.0000 mg | INTRAMUSCULAR | Status: AC | PRN
  Administered 2021-11-09: 80 mg via INTRA_ARTICULAR

## 2021-11-12 DIAGNOSIS — E1165 Type 2 diabetes mellitus with hyperglycemia: Secondary | ICD-10-CM | POA: Diagnosis not present

## 2021-11-12 DIAGNOSIS — I10 Essential (primary) hypertension: Secondary | ICD-10-CM | POA: Diagnosis not present

## 2021-11-12 DIAGNOSIS — E785 Hyperlipidemia, unspecified: Secondary | ICD-10-CM | POA: Diagnosis not present

## 2021-11-12 DIAGNOSIS — Z794 Long term (current) use of insulin: Secondary | ICD-10-CM | POA: Diagnosis not present

## 2021-11-30 ENCOUNTER — Other Ambulatory Visit: Payer: Self-pay

## 2021-11-30 ENCOUNTER — Ambulatory Visit: Payer: Medicare Other | Admitting: Specialist

## 2021-11-30 ENCOUNTER — Ambulatory Visit: Payer: Self-pay

## 2021-11-30 ENCOUNTER — Encounter: Payer: Self-pay | Admitting: Specialist

## 2021-11-30 VITALS — BP 156/73 | HR 76 | Ht 65.0 in | Wt 178.0 lb

## 2021-11-30 DIAGNOSIS — M4326 Fusion of spine, lumbar region: Secondary | ICD-10-CM

## 2021-11-30 DIAGNOSIS — M47818 Spondylosis without myelopathy or radiculopathy, sacral and sacrococcygeal region: Secondary | ICD-10-CM | POA: Diagnosis not present

## 2021-11-30 DIAGNOSIS — M4325 Fusion of spine, thoracolumbar region: Secondary | ICD-10-CM | POA: Diagnosis not present

## 2021-11-30 MED ORDER — MELOXICAM 15 MG PO TBDP: 1.0000 | ORAL_TABLET | Freq: Every day | ORAL | 4 refills | Status: DC

## 2021-11-30 NOTE — Progress Notes (Signed)
Office Visit Note   Patient: [name]           Date of Birth: Oct 31, 1950           MRN: [id number] Visit Date: 11/30/2021              Requested by: Juanda Chance, NP [address],  [place] [address] PCP: Mirna Mires, MD   Assessment & Plan: Visit Diagnoses:  1. Fusion of spine of lumbar region   2. SI joint arthritis   3. Fusion of spine of thoracolumbar region     Plan: Avoid bending, stooping and avoid lifting weights greater than 10 lbs. Avoid prolong standing and walking. Avoid frequent bending and stooping  No lifting greater than 10 lbs. May use ice or moist heat for pain. Weight loss is of benefit. Handicap license is approved. Dr. Scarbro Blas secretary/Assistant will call to arrange for further sacroiliac injections.   If the pain recurrs call us and we would have Dr. Duck Blas secretary/Assistant call to arrange for further sacroiliac injections.    Follow-Up Instructions: No follow-ups on file.   Orders:  Orders Placed This Encounter  Procedures   XR Lumbar Spine 2-3 Views   No orders of the defined types were placed in this encounter.     Procedures: No procedures performed   Clinical Data: No additional findings.   Subjective: Chief Complaint  Patient presents with   Lower Back - Follow-up    71 year old female with history of T-L fusion for progressive DDD and stenosis. She underwent bilateral SI joint injections with good relief of pain. She has problems with an abdomenal hernia and considering having it repaired, in the past she had an incarcerated hernia with emergency surgery so she is going to have the hernia repaired before it too becomes a concern. No bowel or bladder incontinence. Not taking narcotics.  Review of Systems  Constitutional: Negative.   HENT: Negative.    Eyes: Negative.   Respiratory: Negative.    Cardiovascular: Negative.   Gastrointestinal: Negative.   Endocrine: Negative.   Genitourinary:  Negative.   Musculoskeletal: Negative.   Skin: Negative.   Allergic/Immunologic: Negative.   Neurological: Negative.   Hematological: Negative.   Psychiatric/Behavioral: Negative.      Objective: Vital Signs: BP (!) 156/73    Pulse 76    Ht 5\' 5"  (1.651 m)    Wt 178 lb (80.7 kg)    BMI 29.62 kg/m   Physical Exam Constitutional:      Appearance: She is well-developed.  HENT:     Head: Normocephalic and atraumatic.  Eyes:     Pupils: Pupils are equal, round, and reactive to light.  Pulmonary:     Effort: Pulmonary effort is normal.     Breath sounds: Normal breath sounds.  Abdominal:     General: Bowel sounds are normal.     Palpations: Abdomen is soft.  Musculoskeletal:     Cervical back: Normal range of motion and neck supple.  Skin:    General: Skin is warm and dry.  Neurological:     Mental Status: She is alert and oriented to person, place, and time.  Psychiatric:        Behavior: Behavior normal.        Thought Content: Thought content normal.        Judgment: Judgment normal.   Back Exam   Tenderness  The patient is experiencing tenderness in the lumbar.  Range of  Motion  Extension:  abnormal  Lateral bend right:  abnormal  Lateral bend left:  abnormal  Rotation right:  abnormal  Rotation left:  abnormal   Muscle Strength  Right Quadriceps:  5/5  Left Quadriceps:  5/5  Right Hamstrings:  5/5  Left Hamstrings:  5/5     Specialty Comments:  No specialty comments available.  Imaging: No results found.   PMFS History: Patient Active Problem List   Diagnosis Date Noted   Acute blood loss as cause of postoperative anemia 03/07/2018    Priority: High    Class: Acute   Spinal stenosis, lumbar region with neurogenic claudication 03/04/2018    Priority: High    Class: Chronic   Multilevel degenerative disc disease 03/04/2018    Priority: High    Class: Chronic   Forestier's disease of thoracolumbar region 03/04/2018    Priority: High    Class:  Chronic   Sacroiliitis (HCC) 11/09/2021   Ventral hernia without obstruction or gangrene 09/21/2021   Non-intractable vomiting    Lower abdominal pain 03/13/2021   Intractable abdominal pain 03/12/2021   Chest pain 02/14/2021   Incisional hernia, without obstruction or gangrene 12/08/2020   Malrotation of intestine 12/08/2020   GERD (gastroesophageal reflux disease) 09/22/2020   Gastritis 09/19/2020   Other chest pain 09/19/2020   Reducible bulge of abdominal wall 09/19/2020   Obesity 02/23/2020   Chest pain with moderate risk for cardiac etiology 02/22/2020   Dyspnea on exertion    Carotid artery stenosis 01/07/2020   Constipation 09/03/2019   Dysphagia 08/11/2018   Leg swelling 08/11/2018   Bilateral carotid bruits 08/11/2018   Abnormal EKG 08/06/2018   Fusion of spine of thoracolumbar region 03/04/2018   Pain in right hip 03/18/2017   Stiffness of joint, not elsewhere classified, other specified site 08/04/2013   Leg weakness, bilateral 08/04/2013   Chest pain at rest 05/10/2009   BENIGN POSITIONAL VERTIGO 07/30/2007   VARICOSE VEINS, LOWER EXTREMITIES 07/30/2007   SCIATICA, CHRONIC 07/30/2007   Diabetes (HCC) 11/18/2006   Hyperlipidemia 11/18/2006   MIGRAINE HEADACHE 11/18/2006   LESION, SCIATIC NERVE 11/18/2006   Essential hypertension 11/18/2006   HYPOTENSION, ORTHOSTATIC 11/18/2006   BRONCHITIS NOS 11/18/2006   Other emphysema (HCC) 11/18/2006   ASTHMA 11/18/2006   OSTEOARTHRITIS 11/18/2006   LOW BACK PAIN 11/18/2006   VERTIGO 11/18/2006   Past Medical History:  Diagnosis Date   Asthma    Bronchitis    Diabetes mellitus    Hyperlipidemia    Hypertension    Interatrial cardiac shunt    a. small secundum ASD vs pulmonary shunt.   Low back pain    Migraine    Mild carotid artery disease (HCC)    Osteoarthritis    Sciatica    Varicose veins    Vertigo     Family History  Problem Relation Age of Onset   Lung cancer Mother    Breast cancer Mother     Stroke Father    Heart failure Sister    Diabetes Sister    Heart failure Sister    Diabetes Sister    Colon cancer Neg Hx    Gastric cancer Neg Hx    Esophageal cancer Neg Hx     Past Surgical History:  Procedure Laterality Date   ABDOMINAL HYSTERECTOMY     BIOPSY  05/12/2019   Procedure: BIOPSY;  Surgeon: West Bali, MD;  [facility]: [facility];  Service: Endoscopy;;   CARPAL TUNNEL RELEASE Bilateral  CESAREAN SECTION     ESOPHAGOGASTRODUODENOSCOPY (EGD) WITH PROPOFOL N/A 05/12/2019   Procedure: ESOPHAGOGASTRODUODENOSCOPY (EGD) WITH PROPOFOL;  Surgeon: West Bali, MD;  normal-appearing esophagus s/p empiric dilation, moderate gastritis due to ASA s/p biopsy, normal examined duodenum.  Biopsies with gastritis.    INCISIONAL HERNIA REPAIR N/A 03/15/2021   Procedure: Sherald Hess HERNIORRHAPHY WITH MESH;  Surgeon: Lucretia Roers, MD;  Location: [facility];  Service: General;  Laterality: N/A;   INGUINAL HERNIA REPAIR Left    LAPAROTOMY N/A 03/15/2021   Procedure: EXPLORATORY LAPAROTOMY;  Surgeon: Lucretia Roers, MD;  Location: [facility];  Service: General;  Laterality: N/A;   LUMBAR FUSION     LYSIS OF ADHESION N/A 03/15/2021   Procedure: LYSIS OF ADHESION;  Surgeon: Lucretia Roers, MD;  Location: [facility];  Service: General;  Laterality: N/A;   POSTERIOR LUMBAR FUSION 4 LEVEL N/A 03/04/2018   Procedure: Right transforaminal lumbar interbody fusion L1-2, L2-3 Posterior fusion T10, T11, T12, L1, L2 with T 11, T12 pedicle screws, superior sublaminar hooks T10, local bone graft, allograft cancellous chips Vivigen;  Surgeon: Kerrin Champagne, MD;  Location: [facility];  Service: Orthopedics;  Laterality: N/A;   SAVORY DILATION N/A 05/12/2019   Procedure: SAVORY DILATION;  Surgeon: West Bali, MD;  [facility]: [facility];  Service: Endoscopy;  Laterality: N/A;   Social History   Occupational History   Occupation: Disabled    Employer: UNEMPLOYED  Tobacco Use   Smoking status: Never    Smokeless tobacco: Never  Vaping Use   Vaping Use: Never used  Substance and Sexual Activity   Alcohol use: No    Alcohol/week: 0.0 standard drinks   Drug use: No   Sexual activity: Yes    Birth control/protection: Surgical

## 2021-11-30 NOTE — Patient Instructions (Signed)
Plan: Avoid bending, stooping and avoid lifting weights greater than 10 lbs. Avoid prolong standing and walking. Avoid frequent bending and stooping  No lifting greater than 10 lbs. May use ice or moist heat for pain. Weight loss is of benefit. Handicap license is approved. If the pain recurrs call us and we would have Dr. Canovanas Blas secretary/Assistant call to arrange for further sacroiliac injections.

## 2021-12-04 ENCOUNTER — Telehealth: Payer: Self-pay | Admitting: Physical Medicine and Rehabilitation

## 2021-12-04 DIAGNOSIS — K439 Ventral hernia without obstruction or gangrene: Secondary | ICD-10-CM | POA: Diagnosis not present

## 2021-12-04 DIAGNOSIS — E1169 Type 2 diabetes mellitus with other specified complication: Secondary | ICD-10-CM | POA: Diagnosis not present

## 2021-12-04 DIAGNOSIS — N390 Urinary tract infection, site not specified: Secondary | ICD-10-CM | POA: Diagnosis not present

## 2021-12-04 DIAGNOSIS — I10 Essential (primary) hypertension: Secondary | ICD-10-CM | POA: Diagnosis not present

## 2021-12-04 DIAGNOSIS — E785 Hyperlipidemia, unspecified: Secondary | ICD-10-CM | POA: Diagnosis not present

## 2021-12-04 NOTE — Telephone Encounter (Signed)
Pt returned call to [facility] from last week. Pt asking for a call back at [phone].

## 2021-12-21 ENCOUNTER — Other Ambulatory Visit: Payer: Self-pay

## 2021-12-21 ENCOUNTER — Encounter: Payer: Self-pay | Admitting: General Surgery

## 2021-12-21 ENCOUNTER — Ambulatory Visit: Payer: Medicare Other | Admitting: General Surgery

## 2021-12-21 VITALS — BP 108/70 | HR 88 | Temp 98.7°F | Resp 16 | Ht 65.0 in | Wt 182.0 lb

## 2021-12-21 DIAGNOSIS — K432 Incisional hernia without obstruction or gangrene: Secondary | ICD-10-CM

## 2021-12-21 NOTE — H&P (Signed)
[facility] History and Physical  Chief Complaint   Follow-up     [name] is a 71 y.o. female.  HPI: Ms .[name] is a known to me for a prior history of malrotation and hernia repair. She now has a small recurrence on the right side that was confirmed on CT scan. She is having pain in the area and feels like it is getting larger. She is otherwise doing well and has no other new medical issues. She is having Bms but does have to take prune juice.  She wants to proceed with repair.   Past Medical History:  Diagnosis Date   Asthma    Bronchitis    Diabetes mellitus    Hyperlipidemia    Hypertension    Interatrial cardiac shunt    a. small secundum ASD vs pulmonary shunt.   Low back pain    Migraine    Mild carotid artery disease (HCC)    Osteoarthritis    Sciatica    Varicose veins    Vertigo     Past Surgical History:  Procedure Laterality Date   ABDOMINAL HYSTERECTOMY     BIOPSY  05/12/2019   Procedure: BIOPSY;  Surgeon: West Bali, MD;  [facility];  Service: Endoscopy;;   CARPAL TUNNEL RELEASE Bilateral    CESAREAN SECTION     ESOPHAGOGASTRODUODENOSCOPY (EGD) WITH PROPOFOL N/A 05/12/2019   Procedure: ESOPHAGOGASTRODUODENOSCOPY (EGD) WITH PROPOFOL;  Surgeon: West Bali, MD;  normal-appearing esophagus s/p empiric dilation, moderate gastritis due to ASA s/p biopsy, normal examined duodenum.  Biopsies with gastritis.    INCISIONAL HERNIA REPAIR N/A 03/15/2021   Procedure: Sherald Hess HERNIORRHAPHY WITH MESH;  Surgeon: Lucretia Roers, MD;  Location: [facility];  Service: General;  Laterality: N/A;   INGUINAL HERNIA REPAIR Left    LAPAROTOMY N/A 03/15/2021   Procedure: EXPLORATORY LAPAROTOMY;  Surgeon: Lucretia Roers, MD;  Location: [facility];  Service: General;  Laterality: N/A;   LUMBAR FUSION     LYSIS OF ADHESION N/A 03/15/2021   Procedure: LYSIS OF ADHESION;  Surgeon: Lucretia Roers, MD;  Location: [facility];  Service:  General;  Laterality: N/A;   POSTERIOR LUMBAR FUSION 4 LEVEL N/A 03/04/2018   Procedure: Right transforaminal lumbar interbody fusion L1-2, L2-3 Posterior fusion T10, T11, T12, L1, L2 with T 11, T12 pedicle screws, superior sublaminar hooks T10, local bone graft, allograft cancellous chips Vivigen;  Surgeon: Kerrin Champagne, MD;  Location: [facility];  Service: Orthopedics;  Laterality: N/A;   SAVORY DILATION N/A 05/12/2019   Procedure: SAVORY DILATION;  Surgeon: West Bali, MD;  [facility]: [facility];  Service: Endoscopy;  Laterality: N/A;    Family History  Problem Relation Age of Onset   Lung cancer Mother    Breast cancer Mother    Stroke Father    Heart failure Sister    Diabetes Sister    Heart failure Sister    Diabetes Sister    Colon cancer Neg Hx    Gastric cancer Neg Hx    Esophageal cancer Neg Hx     Social History   Tobacco Use   Smoking status: Never   Smokeless tobacco: Never  Vaping Use   Vaping Use: Never used  Substance Use Topics   Alcohol use: No    Alcohol/week: 0.0 standard drinks   Drug use: No    Medications: I have reviewed the patient's current medications. Allergies as of 12/21/2021   No Known  Allergies      Medication List        Accurate as of December 21, 2021  1:22 PM. If you have any questions, ask your nurse or doctor.          STOP taking these medications    Alcohol Prep 70 % Pads Stopped by: Lucretia Roers, MD       TAKE these medications    acetaminophen 325 MG tablet Commonly known as: TYLENOL Take 2 tablets (650 mg total) by mouth every 6 (six) hours as needed for mild pain (or Fever >/= 101).   amLODipine 10 MG tablet Commonly known as: NORVASC Take 1 tablet by mouth daily.   AquaLance Lancets 30G Misc   aspirin EC 81 MG tablet Take 1 tablet (81 mg total) by mouth daily with breakfast.   atorvastatin 40 MG tablet Commonly known as: LIPITOR Take 40 mg by mouth daily. What changed: Another medication with  the same name was removed. Continue taking this medication, and follow the directions you see here. Changed by: Lucretia Roers, MD   fluorometholone 0.1 % ophthalmic suspension Commonly known as: FML 1 drop 3 (three) times daily.   HumuLIN 70/30 (70-30) 100 UNIT/ML injection Generic drug: insulin NPH-regular Human Inject 45 Units into the skin 2 (two) times daily with a meal.   hydrochlorothiazide 12.5 MG capsule Commonly known as: MICROZIDE Take 1 capsule (12.5 mg total) by mouth daily.   isosorbide mononitrate 30 MG 24 hr tablet Commonly known as: IMDUR Take 0.5 tablets (15 mg total) by mouth daily.   losartan 50 MG tablet Commonly known as: COZAAR Take 50 mg by mouth daily.   Meloxicam 15 MG Tbdp Take 1 tablet by mouth daily with breakfast.   metFORMIN 1000 MG tablet Commonly known as: GLUCOPHAGE Take 1,000 mg by mouth at bedtime.   methocarbamol 500 MG tablet Commonly known as: ROBAXIN Take 500 mg by mouth every 8 (eight) hours as needed.   nitroGLYCERIN 0.4 MG SL tablet Commonly known as: NITROSTAT Place 1 tablet (0.4 mg total) under the tongue every 5 (five) minutes as needed for chest pain. Dissolve one tablet under the tongue every 5 mintues as needed for chest pain.   ondansetron 4 MG tablet Commonly known as: ZOFRAN Take 1 tablet (4 mg total) by mouth every 6 (six) hours as needed for nausea.   pantoprazole 40 MG tablet Commonly known as: Protonix Take 1 tablet (40 mg total) by mouth daily.   polyethylene glycol 17 g packet Commonly known as: MIRALAX / GLYCOLAX Take 17 g by mouth daily.   Rybelsus 14 MG Tabs Generic drug: Semaglutide Take 1 tablet by mouth daily.   senna-docusate 8.6-50 MG tablet Commonly known as: Senokot-S Take 2 tablets by mouth at bedtime.   Sure Comfort Insulin Syringe 31G X 5/16" 1 ML Misc Generic drug: Insulin Syringe-Needle U-100   triamcinolone ointment 0.1 % Commonly known as: KENALOG Apply topically 2 (two) times  daily.         ROS:  A comprehensive review of systems was negative except for: Gastrointestinal: positive for abdominal pain  Blood pressure 108/70, pulse 88, temperature 98.7 F (37.1 C), temperature source Oral, resp. rate 16, height 5\' 5"  (1.651 m), weight 182 lb (82.6 kg), SpO2 96 %. Physical Exam Vitals reviewed.  Constitutional:      Appearance: She is obese.  HENT:     Head: Normocephalic.     Nose: Nose normal.  Eyes:  Extraocular Movements: Extraocular movements intact.  Cardiovascular:     Rate and Rhythm: Normal rate and regular rhythm.  Pulmonary:     Effort: Pulmonary effort is normal.  Abdominal:     General: There is no distension.     Palpations: Abdomen is soft.     Tenderness: There is no abdominal tenderness.     Hernia: A hernia is present.     Comments: Right of midline lower abdomen, below umbilicus a non reducible hernia  Musculoskeletal:        General: Normal range of motion.  Skin:    General: Skin is warm.  Neurological:     General: No focal deficit present.     Mental Status: She is alert.  Psychiatric:        Mood and Affect: Mood normal.    Results: Personally reviewed CT- hernia right of the repair with fat about 2cm in size, her midline is essentially the other mesh  CLINICAL DATA:  Postop lower abdominal pain after hernia repair.   EXAM: CT ABDOMEN AND PELVIS WITH CONTRAST   TECHNIQUE: Multidetector CT imaging of the abdomen and pelvis was performed using the standard protocol following bolus administration of intravenous contrast.   RADIATION DOSE REDUCTION: This exam was performed according to the departmental dose-optimization program which includes automated exposure control, adjustment of the mA and/or kV according to patient size and/or use of iterative reconstruction technique.   CONTRAST:  100mL OMNIPAQUE IOHEXOL 300 MG/ML  SOLN   COMPARISON:  03/12/2021.   FINDINGS: Lower chest: Minimal scarring in the lung  bases. Heart is at the upper limits of normal in size. No pericardial or pleural effusion. Distal esophagus is grossly unremarkable.   Hepatobiliary: Tiny blushes of hyperattenuation in the liver dome (2/13-14), too small to characterize, likely perfusion anomalies. Subcentimeter faint low-attenuation lesion in the posterior dome of the liver (2/18), too small to characterize. Liver is otherwise unremarkable. Cholecystectomy. No unexpected biliary ductal dilatation.   Pancreas: Negative.   Spleen: Negative.   Adrenals/Urinary Tract: Adrenal glands and kidneys are unremarkable. Ureters are decompressed. Bladder is grossly unremarkable.   Stomach/Bowel: Stomach is unremarkable. Small bowel and colon appear non rotated. Stool throughout the colon.   Vascular/Lymphatic: Atherosclerotic calcification of the aorta. No pathologically enlarged lymph nodes.   Reproductive: Hysterectomy.  No adnexal mass.   Other: Small bilateral inguinal hernias contain fat. Interval periumbilical hernia repair with persistent laxity of the ventral midline abdominal wall. Along the right lateral margin of the mesh is a small fat containing hernia. No free fluid. Mesenteries and peritoneum are otherwise unremarkable.   Musculoskeletal: Degenerative and postoperative changes in the spine. No worrisome lytic or sclerotic lesions.   IMPRESSION: 1. Previous periumbilical hernia repair with persistent laxity of the ventral abdominal wall. 2. Small fat containing hernia along the right lateral margin of the mesh. 3.  Aortic atherosclerosis (ICD10-I70.0).     Electronically Signed   By: Leanna BattlesMelinda  Blietz M.D.  Assessment & Plan:  [name] is a 71 y.o. female with an incisional hernia. This will need open repair and will probably need to be right of midline incision given that the midline is completely mesh. Discussed risk of bleeding, infection, recurrence, use of mesh and right of midline  incision.    All questions were answered to the satisfaction of the patient.   Lucretia RoersLindsay C Kamden Reber 12/21/2021, 1:22 PM

## 2021-12-21 NOTE — Progress Notes (Signed)
[facility] History and Physical  Chief Complaint   Follow-up     [name] is a 71 y.o. female.  HPI: [name] is a known to me for a prior history of malrotation and hernia repair. She now has a small recurrence on the right side that was confirmed on CT scan. She is having pain in the area and feels like it is getting larger. She is otherwise doing well and has no other new medical issues. She is having Bms but does have to take prune juice.  She wants to proceed with repair.   Past Medical History:  Diagnosis Date   Asthma    Bronchitis    Diabetes mellitus    Hyperlipidemia    Hypertension    Interatrial cardiac shunt    a. small secundum ASD vs pulmonary shunt.   Low back pain    Migraine    Mild carotid artery disease (HCC)    Osteoarthritis    Sciatica    Varicose veins    Vertigo     Past Surgical History:  Procedure Laterality Date   ABDOMINAL HYSTERECTOMY     BIOPSY  05/12/2019   Procedure: BIOPSY;  Surgeon: West Bali, MD;  Location: [facility];  Service: Endoscopy;;   CARPAL TUNNEL RELEASE Bilateral    CESAREAN SECTION     ESOPHAGOGASTRODUODENOSCOPY (EGD) WITH PROPOFOL N/A 05/12/2019   Procedure: ESOPHAGOGASTRODUODENOSCOPY (EGD) WITH PROPOFOL;  Surgeon: West Bali, MD;  normal-appearing esophagus s/p empiric dilation, moderate gastritis due to ASA s/p biopsy, normal examined duodenum.  Biopsies with gastritis.    INCISIONAL HERNIA REPAIR N/A 03/15/2021   Procedure: Sherald Hess HERNIORRHAPHY WITH MESH;  Surgeon: Lucretia Roers, MD;  Location: [facility];  Service: General;  Laterality: N/A;   INGUINAL HERNIA REPAIR Left    LAPAROTOMY N/A 03/15/2021   Procedure: EXPLORATORY LAPAROTOMY;  Surgeon: Lucretia Roers, MD;  Location: [facility];  Service: General;  Laterality: N/A;   LUMBAR FUSION     LYSIS OF ADHESION N/A 03/15/2021   Procedure: LYSIS OF ADHESION;  Surgeon: Lucretia Roers, MD;  Location: [facility];  Service:  General;  Laterality: N/A;   POSTERIOR LUMBAR FUSION 4 LEVEL N/A 03/04/2018   Procedure: Right transforaminal lumbar interbody fusion L1-2, L2-3 Posterior fusion T10, T11, T12, L1, L2 with T 11, T12 pedicle screws, superior sublaminar hooks T10, local bone graft, allograft cancellous chips Vivigen;  Surgeon: Kerrin Champagne, MD;  Location: [facility];  Service: Orthopedics;  Laterality: N/A;   SAVORY DILATION N/A 05/12/2019   Procedure: SAVORY DILATION;  Surgeon: West Bali, MD;  Location: [facility];  Service: Endoscopy;  Laterality: N/A;    Family History  Problem Relation Age of Onset   Lung cancer Mother    Breast cancer Mother    Stroke Father    Heart failure Sister    Diabetes Sister    Heart failure Sister    Diabetes Sister    Colon cancer Neg Hx    Gastric cancer Neg Hx    Esophageal cancer Neg Hx     Social History   Tobacco Use   Smoking status: Never   Smokeless tobacco: Never  Vaping Use   Vaping Use: Never used  Substance Use Topics   Alcohol use: No    Alcohol/week: 0.0 standard drinks   Drug use: No    Medications: I have reviewed the patient's current medications. Allergies as of 12/21/2021   No Known  Allergies      Medication List        Accurate as of December 21, 2021  1:22 PM. If you have any questions, ask your nurse or doctor.          STOP taking these medications    Alcohol Prep 70 % Pads Stopped by: Lucretia Roers, MD       TAKE these medications    acetaminophen 325 MG tablet Commonly known as: TYLENOL Take 2 tablets (650 mg total) by mouth every 6 (six) hours as needed for mild pain (or Fever >/= 101).   amLODipine 10 MG tablet Commonly known as: NORVASC Take 1 tablet by mouth daily.   AquaLance Lancets 30G Misc   aspirin EC 81 MG tablet Take 1 tablet (81 mg total) by mouth daily with breakfast.   atorvastatin 40 MG tablet Commonly known as: LIPITOR Take 40 mg by mouth daily. What changed: Another medication with  the same name was removed. Continue taking this medication, and follow the directions you see here. Changed by: Lucretia Roers, MD   fluorometholone 0.1 % ophthalmic suspension Commonly known as: FML 1 drop 3 (three) times daily.   HumuLIN 70/30 (70-30) 100 UNIT/ML injection Generic drug: insulin NPH-regular Human Inject 45 Units into the skin 2 (two) times daily with a meal.   hydrochlorothiazide 12.5 MG capsule Commonly known as: MICROZIDE Take 1 capsule (12.5 mg total) by mouth daily.   isosorbide mononitrate 30 MG 24 hr tablet Commonly known as: IMDUR Take 0.5 tablets (15 mg total) by mouth daily.   losartan 50 MG tablet Commonly known as: COZAAR Take 50 mg by mouth daily.   Meloxicam 15 MG Tbdp Take 1 tablet by mouth daily with breakfast.   metFORMIN 1000 MG tablet Commonly known as: GLUCOPHAGE Take 1,000 mg by mouth at bedtime.   methocarbamol 500 MG tablet Commonly known as: ROBAXIN Take 500 mg by mouth every 8 (eight) hours as needed.   nitroGLYCERIN 0.4 MG SL tablet Commonly known as: NITROSTAT Place 1 tablet (0.4 mg total) under the tongue every 5 (five) minutes as needed for chest pain. Dissolve one tablet under the tongue every 5 mintues as needed for chest pain.   ondansetron 4 MG tablet Commonly known as: ZOFRAN Take 1 tablet (4 mg total) by mouth every 6 (six) hours as needed for nausea.   pantoprazole 40 MG tablet Commonly known as: Protonix Take 1 tablet (40 mg total) by mouth daily.   polyethylene glycol 17 g packet Commonly known as: MIRALAX / GLYCOLAX Take 17 g by mouth daily.   Rybelsus 14 MG Tabs Generic drug: Semaglutide Take 1 tablet by mouth daily.   senna-docusate 8.6-50 MG tablet Commonly known as: Senokot-S Take 2 tablets by mouth at bedtime.   Sure Comfort Insulin Syringe 31G X 5/16" 1 ML Misc Generic drug: Insulin Syringe-Needle U-100   triamcinolone ointment 0.1 % Commonly known as: KENALOG Apply topically 2 (two) times  daily.         ROS:  A comprehensive review of systems was negative except for: Gastrointestinal: positive for abdominal pain  Blood pressure 108/70, pulse 88, temperature 98.7 F (37.1 C), temperature source Oral, resp. rate 16, height 5\' 5"  (1.651 m), weight 182 lb (82.6 kg), SpO2 96 %. Physical Exam Vitals reviewed.  Constitutional:      Appearance: She is obese.  HENT:     Head: Normocephalic.     Nose: Nose normal.  Eyes:  Extraocular Movements: Extraocular movements intact.  Cardiovascular:     Rate and Rhythm: Normal rate and regular rhythm.  Pulmonary:     Effort: Pulmonary effort is normal.  Abdominal:     General: There is no distension.     Palpations: Abdomen is soft.     Tenderness: There is no abdominal tenderness.     Hernia: A hernia is present.     Comments: Right of midline lower abdomen, below umbilicus a non reducible hernia  Musculoskeletal:        General: Normal range of motion.  Skin:    General: Skin is warm.  Neurological:     General: No focal deficit present.     Mental Status: She is alert.  Psychiatric:        Mood and Affect: Mood normal.    Results: Personally reviewed CT- hernia right of the repair with fat about 2cm in size, her midline is essentially the other mesh  CLINICAL DATA:  Postop lower abdominal pain after hernia repair.   EXAM: CT ABDOMEN AND PELVIS WITH CONTRAST   TECHNIQUE: Multidetector CT imaging of the abdomen and pelvis was performed using the standard protocol following bolus administration of intravenous contrast.   RADIATION DOSE REDUCTION: This exam was performed according to the departmental dose-optimization program which includes automated exposure control, adjustment of the mA and/or kV according to patient size and/or use of iterative reconstruction technique.   CONTRAST:  OMNIPAQUE IOHEXOL 300 MG/ML  SOLN   COMPARISON:  03/12/2021.   FINDINGS: Lower chest: Minimal scarring in the lung  bases. Heart is at the upper limits of normal in size. No pericardial or pleural effusion. Distal esophagus is grossly unremarkable.   Hepatobiliary: Tiny blushes of hyperattenuation in the liver dome (2/13-14), too small to characterize, likely perfusion anomalies. Subcentimeter faint low-attenuation lesion in the posterior dome of the liver (2/18), too small to characterize. Liver is otherwise unremarkable. Cholecystectomy. No unexpected biliary ductal dilatation.   Pancreas: Negative.   Spleen: Negative.   Adrenals/Urinary Tract: Adrenal glands and kidneys are unremarkable. Ureters are decompressed. Bladder is grossly unremarkable.   Stomach/Bowel: Stomach is unremarkable. Small bowel and colon appear non rotated. Stool throughout the colon.   Vascular/Lymphatic: Atherosclerotic calcification of the aorta. No pathologically enlarged lymph nodes.   Reproductive: Hysterectomy.  No adnexal mass.   Other: Small bilateral inguinal hernias contain fat. Interval periumbilical hernia repair with persistent laxity of the ventral midline abdominal wall. Along the right lateral margin of the mesh is a small fat containing hernia. No free fluid. Mesenteries and peritoneum are otherwise unremarkable.   Musculoskeletal: Degenerative and postoperative changes in the spine. No worrisome lytic or sclerotic lesions.   IMPRESSION: 1. Previous periumbilical hernia repair with persistent laxity of the ventral abdominal wall. 2. Small fat containing hernia along the right lateral margin of the mesh. 3.  Aortic atherosclerosis (ICD10-I70.0).     Electronically Signed   By: Leanna Battles M.D.  Assessment & Plan:  [name] is a 71 y.o. female with an incisional hernia. This will need open repair and will probably need to be right of midline incision given that the midline is completely mesh. Discussed risk of bleeding, infection, recurrence, use of mesh and right of midline  incision.    All questions were answered to the satisfaction of the patient.   Lucretia Roers 12/21/2021, 1:22 PM

## 2021-12-21 NOTE — Patient Instructions (Signed)

## 2021-12-27 ENCOUNTER — Telehealth: Payer: Self-pay | Admitting: *Deleted

## 2021-12-27 NOTE — Telephone Encounter (Addendum)
Received call from patient.  ? ?Reports that she has scheduled surgery for incisional hernia on 01/18/2022. Reports that she would prefer to stay overnight in hospital for observation.  ? ?Reports that her blood sugars have been running elevated for her. Highest sugar noted at 196 on 12/27/2021. Also reports increased itching and pain at prior hernia surgery in 03/2021. ? ?Patient also reports x1 week of increased dizziness when she gets up. Reports that room feels like it is spinning when she stands from sitting or lying down. Reports that she has been evaluated at PCP for vertigo, but was advised that she did not have vertigo. No medications were prescribed at that time.  ? ?States that sister is living with her, but is disabled and would not be able to care for her appropriately after surgery.  ? ?Please advise.  ?

## 2021-12-28 NOTE — Telephone Encounter (Signed)
Call placed to patient and patient made aware.  ? ?Surgery changed to outpatient and obs bed. ?

## 2022-01-01 ENCOUNTER — Other Ambulatory Visit: Payer: Self-pay

## 2022-01-01 ENCOUNTER — Encounter: Payer: Self-pay | Admitting: Physical Medicine and Rehabilitation

## 2022-01-01 ENCOUNTER — Ambulatory Visit: Payer: Medicare Other | Admitting: Physical Medicine and Rehabilitation

## 2022-01-01 ENCOUNTER — Ambulatory Visit: Payer: Self-pay

## 2022-01-01 VITALS — BP 109/70 | HR 87

## 2022-01-01 DIAGNOSIS — M461 Sacroiliitis, not elsewhere classified: Secondary | ICD-10-CM

## 2022-01-01 NOTE — Progress Notes (Signed)
Pt state lower back pain that travels to her right leg. Pt state walking and bending makes the pain worse. Pt state she uses heating to help ease her pain. ? ?Numeric Pain Rating Scale and Functional Assessment ?Average Pain 8 ? ? ?In the last MONTH (on 0-10 scale) has pain interfered with the following? ? ?1. General activity like being  able to carry out your everyday physical activities such as walking, climbing stairs, carrying groceries, or moving a chair?  ?Rating(10) ? ? ?+Driver, -BT, -Dye Allergies. ? ?

## 2022-01-01 NOTE — Progress Notes (Signed)
? ?[name] - 71 y.o. female MRN [id number]  Date of birth: 11/13/1950 ? ?Office Visit Note: ?Visit Date: 01/01/2022 ?PCP: Mirna Mires, MD ?Referred by: Mirna Mires, MD ? ?Subjective: ?Chief Complaint  ?Patient presents with  ? Lower Back - Pain  ? Right Leg - Pain  ? ?HPI:  [name] is a 71 y.o. female who comes in today at the request of Dr. [name] for planned Right anesthetic sacroiliac joint arthrogram with fluoroscopic guidance.  The patient has failed conservative care including home exercise, medications, time and activity modification.  This injection will be diagnostic and hopefully therapeutic.  Please see requesting physician notes for further details and justification.  She did have prior diagnostic sacroiliac joint injection with 80% relief temporarily.  Hopefully this injection will be therapeutic.  As per the new guidelines of Medicare and the [employer] we cannot offer her radiofrequency ablation anymore.  This has been turned to the investigational procedure. ?  ? ?ROS Otherwise per HPI. ? ?Assessment & Plan: ?Visit Diagnoses:  ?  ICD-10-CM   ?1. Sacroiliitis (HCC)  M46.1 Sacroiliac Joint Inj  ?  XR C-ARM NO REPORT  ?  ?  ?Plan: No additional findings.  ? ?Meds & Orders: No orders of the defined types were placed in this encounter. ?  ?Orders Placed This Encounter  ?Procedures  ? Sacroiliac Joint Inj  ? XR C-ARM NO REPORT  ?  ?Follow-up: Return if symptoms worsen or fail to improve.  ? ?Procedures: ?Sacroiliac Joint Inj on 01/01/2022 9:01 AM ?Indications: pain and diagnostic evaluation ?Details: 22 G 3.5 in needle, fluoroscopy-guided posterior approach ?Medications: 2 mL bupivacaine 0.5 %; 80 mg methylPREDNISolone acetate 80 MG/ML ?Outcome: tolerated well, no immediate complications ? ?Sacroiliac Joint Intra-Articular Injection - Posterior Approach with Fluoroscopic Guidance  ? ?Position: PRONE ? ?Additional Comments: ?Vital signs were monitored before and after the  procedure. ?Patient was prepped and draped in the usual sterile fashion. ?The correct patient, procedure, and site was verified. ? ? ?Injection Procedure Details:  ? ?Location/Site:  ?Sacroiliac joint ? ?Needle size: 3.5 in Spinal Needle ? ?Needle type: Spinal ? ?Needle Placement: Intra-articular ? ?Findings: ? -Comments: There was excellent flow of contrast producing a partial arthrogram of the sacroiliac joint.  ? ?Procedure Details: ?Starting with a 90 degree vertical and midline orientation the fluoroscope was tilted cranially 20 to 25 degrees and the target area of the inferior most part of the SI joint on the side mentioned above was visualized.  The soft tissues overlying this target were infiltrated with 4 ml. of 1% Lidocaine without Epinephrine. A #22 gauge spinal needle was inserted perpendicular to the fluoroscope table and advanced into the posterior inferior joint space using fluoroscopic guidance. ? ?Position in the joint space was confirmed by obtaining a partial arthrogram using a 2 ml. volume of Isovue-250 contrast agent. After negative aspirate for gross pus or blood, the injectate was delivered to the joint. Radiographs were obtained for documentation purposes. ? ? ?Additional Comments:  ? ?Dressing: Bandaid ?  ? ?Post-procedure details: ?Patient was observed during the procedure. ?Post-procedure instructions were reviewed. ? ?Patient left the clinic in stable condition. ? ? ? There was excellent flow of contrast producing a partial arthrogram of the sacroiliac joint.  ?Procedure, treatment alternatives, risks and benefits explained, specific risks discussed. Consent was given by the patient. Immediately prior to procedure a time out was called to verify the correct patient, procedure, equipment, support staff and site/side marked  as required. Patient was prepped and draped in the usual sterile fashion.  ? ?  ?   ? ?Clinical History: ?No specialty comments available.  ? ? ? ?Objective:  VS:  HT:     WT:   BMI:     BP:109/70  HR:87bpm  TEMP: ( )  RESP:  ?Physical Exam ?Vitals and nursing note reviewed.  ?Constitutional:   ?   General: She is not in acute distress. ?   Appearance: Normal appearance. She is not ill-appearing.  ?HENT:  ?   Head: Normocephalic and atraumatic.  ?   Right Ear: External ear normal.  ?   Left Ear: External ear normal.  ?Eyes:  ?   Extraocular Movements: Extraocular movements intact.  ?Cardiovascular:  ?   Rate and Rhythm: Normal rate.  ?   Pulses: Normal pulses.  ?Pulmonary:  ?   Effort: Pulmonary effort is normal. No respiratory distress.  ?Abdominal:  ?   General: There is no distension.  ?   Palpations: Abdomen is soft.  ?Musculoskeletal:     ?   General: Tenderness present.  ?   Cervical back: Neck supple.  ?   Right lower leg: No edema.  ?   Left lower leg: No edema.  ?   Comments: Patient has good distal strength with no pain over the greater trochanters.  No clonus or focal weakness. Positive Fortin finger sign, Patrick's testing and lateral compression test. ?   ?Skin: ?   Findings: No erythema, lesion or rash.  ?Neurological:  ?   General: No focal deficit present.  ?   Mental Status: She is alert and oriented to person, place, and time.  ?   Sensory: No sensory deficit.  ?   Motor: No weakness or abnormal muscle tone.  ?   Coordination: Coordination normal.  ?Psychiatric:     ?   Mood and Affect: Mood normal.     ?   Behavior: Behavior normal.  ?  ? ?Imaging: ?No results found. ?

## 2022-01-01 NOTE — Patient Instructions (Signed)

## 2022-01-02 MED ORDER — BUPIVACAINE HCL 0.5 % IJ SOLN
2.0000 mL | INTRAMUSCULAR | Status: AC | PRN
  Administered 2022-01-01: 2 mL via INTRA_ARTICULAR

## 2022-01-02 MED ORDER — METHYLPREDNISOLONE ACETATE 80 MG/ML IJ SUSP
80.0000 mg | INTRAMUSCULAR | Status: AC | PRN
  Administered 2022-01-01: 80 mg via INTRA_ARTICULAR

## 2022-01-15 NOTE — Patient Instructions (Signed)
? ? ? ? ? ? ? ? [name] ? 01/15/2022  ?  ? @PREFPERIOPPHARMACY @ ? ? Your procedure is scheduled on  01/18/2022. ? ? Report to 03/20/2022 at  0600 A.M. ? ? Call this number if you have problems the morning of surgery: ? [phone] ? ? Remember: ? Do not eat or drink after midnight. ?  ?  ? Take these medicines the morning of surgery with A SIP OF WATER  ? ?amlodipine, imdur, meloxicam, robaxin(if needed). ?  ? Do not wear jewelry, make-up or nail polish. ? Do not wear lotions, powders, or perfumes, or deodorant. ? Do not shave 48 hours prior to surgery.  Men may shave face and neck. ? Do not bring valuables to the hospital. ?  is not responsible for any belongings or valuables. ? ?Contacts, dentures or bridgework may not be worn into surgery.  Leave your suitcase in the car.  After surgery it may be brought to your room. ? ?For patients admitted to the hospital, discharge time will be determined by your treatment team. ? ?Patients discharged the day of surgery will not be allowed to drive home and must have someone with them for 24 hours.  ? ? ?Special instructions:   DO NOT smoke tobacco or vape for 24 hours before your procedure. ? ?Please read over the following fact sheets that you were given. ?Coughing and Deep Breathing, Surgical Site Infection Prevention, Anesthesia Post-op Instructions, and Care and Recovery After Surgery ?  ? ? ? Open Hernia Repair, Adult, Care After ?What can I expect after the procedure? ?After the procedure, it is common to have: ?Mild discomfort. ?Slight bruising. ?Mild swelling. ?Pain in the belly (abdomen). ?A small amount of blood from the cut from surgery (incision). ?Follow these instructions at home: ?Your doctor may give you more specific instructions. If you have problems, call your doctor. ?Medicines ?Take over-the-counter and prescription medicines only as told by your doctor. ?If told, take steps to prevent problems with pooping (constipation). You may  need to: ?Drink enough fluid to keep your pee (urine) pale yellow. ?Take medicines. You will be told what medicines to take. ?Eat foods that are high in fiber. These include beans, whole grains, and fresh fruits and vegetables. ?Limit foods that are high in fat and sugar. These include fried or sweet foods. ?Ask your doctor if you should avoid driving or using machines while you are taking your medicine. ?Incision care ? ?Follow instructions from your doctor about how to take care of your incision. Make sure you: ?Wash your hands with soap and water for at least 20 seconds before and after you change your bandage (dressing). If you cannot use soap and water, use hand sanitizer. ?Change your bandage. ?Leave stitches or skin glue in place for at least 2 weeks. ?Leave tape strips alone unless you are told to take them off. You may trim the edges of the tape strips if they curl up. ?Check your incision every day for signs of infection. Check for: ?More redness, swelling, or pain. ?More fluid or blood. ?Warmth. ?Pus or a bad smell. ?Wear loose, soft clothing while your incision heals. ?Activity ? ?Rest as told by your doctor. ?Do not lift anything that is heavier than 10 lb (4.5 kg), or the limit that you are told. ?Do not play contact sports until your doctor says that this is safe. ?If you were given a sedative during your procedure, do not drive or use machines  until your doctor says that it is safe. A sedative is a medicine that helps you relax. ?Return to your normal activities when your doctor says that it is safe. ?General instructions ?Do not take baths, swim, or use a hot tub. Ask your doctor about taking showers or sponge baths. ?Hold a pillow over your belly when you cough or sneeze. This helps with pain. ?Do not smoke or use any products that contain nicotine or tobacco. If you need help quitting, ask your doctor. ?Keep all follow-up visits. ?Contact a doctor if: ?You have any of these signs of infection in  or around your incision: ?More redness, swelling, or pain. ?More fluid or blood. ?Warmth. ?Pus. ?A bad smell. ?You have a fever or chills. ?You have blood in your poop (stool). ?You have not pooped (had a bowel movement) in 2-3 days. ?Medicine does not help your pain. ?Get help right away if: ?You have chest pain, or you are short of breath. ?You feel faint or light-headed. ?You have very bad pain. ?You vomit and your pain is worse. ?You have pain, swelling, or redness in a leg. ?These symptoms may be an emergency. Get help right away. Call your local emergency services (911 in the [place] ?Do not wait to see if the symptoms will go away. ?Do not drive yourself to the hospital. ?Summary ?After this procedure, it is common to have mild discomfort, slight bruising, and mild swelling. ?Follow instructions from your doctor about how to take care of your cut from surgery (incision). Check every day for signs of infection. ?Do not lift heavy objects or play contact sports until your doctor says it is safe. ?Return to your normal activities as told by your doctor. ?This information is not intended to replace advice given to you by your health care provider. Make sure you discuss any questions you have with your health care provider. ?Document Revised: 05/16/2020 Document Reviewed: 05/16/2020 ?Elsevier Patient Education ? 2022 [employer] ?General Anesthesia, Adult, Care After ?This sheet gives you information about how to care for yourself after your procedure. Your health care provider may also give you more specific instructions. If you have problems or questions, contact your health care provider. ?What can I expect after the procedure? ?After the procedure, the following side effects are common: ?Pain or discomfort at the IV site. ?Nausea. ?Vomiting. ?Sore throat. ?Trouble concentrating. ?Feeling cold or chills. ?Feeling weak or tired. ?Sleepiness and fatigue. ?Soreness and body aches. These side effects can affect  parts of the body that were not involved in surgery. ?Follow these instructions at home: ?For the time period you were told by your health care provider: ? ?Rest. ?Do not participate in activities where you could fall or become injured. ?Do not drive or use machinery. ?Do not drink alcohol. ?Do not take sleeping pills or medicines that cause drowsiness. ?Do not make important decisions or sign legal documents. ?Do not take care of children on your own. ?Eating and drinking ?Follow any instructions from your health care provider about eating or drinking restrictions. ?When you feel hungry, start by eating small amounts of foods that are soft and easy to digest (bland), such as toast. Gradually return to your regular diet. ?Drink enough fluid to keep your urine pale yellow. ?If you vomit, rehydrate by drinking water, juice, or clear broth. ?General instructions ?If you have sleep apnea, surgery and certain medicines can increase your risk for breathing problems. Follow instructions from your health care provider about wearing your sleep  device: ?Anytime you are sleeping, including during daytime naps. ?While taking prescription pain medicines, sleeping medicines, or medicines that make you drowsy. ?Have a responsible adult stay with you for the time you are told. It is important to have someone help care for you until you are awake and alert. ?Return to your normal activities as told by your health care provider. Ask your health care provider what activities are safe for you. ?Take over-the-counter and prescription medicines only as told by your health care provider. ?If you smoke, do not smoke without supervision. ?Keep all follow-up visits as told by your health care provider. This is important. ?Contact a health care provider if: ?You have nausea or vomiting that does not get better with medicine. ?You cannot eat or drink without vomiting. ?You have pain that does not get better with medicine. ?You are unable to  pass urine. ?You develop a skin rash. ?You have a fever. ?You have redness around your IV site that gets worse. ?Get help right away if: ?You have difficulty breathing. ?You have chest pain. ?You have bloo

## 2022-01-16 ENCOUNTER — Other Ambulatory Visit (HOSPITAL_COMMUNITY)
Admission: RE | Admit: 2022-01-16 | Discharge: 2022-01-16 | Disposition: A | Discharge status: Home / Self Care | Payer: Medicare Other | Source: Ambulatory Visit | Attending: General Surgery | Admitting: General Surgery

## 2022-01-16 ENCOUNTER — Encounter (HOSPITAL_COMMUNITY)
Admission: RE | Admit: 2022-01-16 | Discharge: 2022-01-16 | Disposition: A | Discharge status: Home / Self Care | Payer: Medicare Other | Source: Ambulatory Visit | Attending: General Surgery | Admitting: General Surgery

## 2022-01-16 VITALS — BP 153/54 | HR 88 | Temp 98.6°F | Resp 18 | Ht 65.0 in | Wt 185.0 lb

## 2022-01-16 DIAGNOSIS — Z01812 Encounter for preprocedural laboratory examination: Secondary | ICD-10-CM | POA: Insufficient documentation

## 2022-01-16 DIAGNOSIS — D62 Acute posthemorrhagic anemia: Secondary | ICD-10-CM | POA: Diagnosis not present

## 2022-01-16 DIAGNOSIS — Z794 Long term (current) use of insulin: Secondary | ICD-10-CM | POA: Diagnosis not present

## 2022-01-16 DIAGNOSIS — E1169 Type 2 diabetes mellitus with other specified complication: Secondary | ICD-10-CM | POA: Diagnosis not present

## 2022-01-16 DIAGNOSIS — D649 Anemia, unspecified: Secondary | ICD-10-CM

## 2022-01-16 LAB — BASIC METABOLIC PANEL
Anion gap: 10 (ref 5–15)
BUN: 12 mg/dL (ref 8–23)
CO2: 25 mmol/L (ref 22–32)
Calcium: 9.1 mg/dL (ref 8.9–10.3)
Chloride: 100 mmol/L (ref 98–111)
Creatinine, Ser: 0.82 mg/dL (ref 0.44–1.00)
GFR, Estimated: >60 mL/min (ref >60)
Glucose, Bld: 289 mg/dL — ABNORMAL HIGH (ref 70–99)
Potassium: 3.9 mmol/L (ref 3.5–5.1)
Sodium: 135 mmol/L (ref 135–145)

## 2022-01-16 LAB — CBC WITH DIFFERENTIAL/PLATELET
Abs Immature Granulocytes: 0.05 10*3/uL (ref 0.00–0.07)
Basophils Absolute: 0 10*3/uL (ref 0.0–0.1)
Basophils Relative: 0 %
Eosinophils Absolute: 0.3 10*3/uL (ref 0.0–0.5)
Eosinophils Relative: 2 %
HCT: 43.1 % (ref 36.0–46.0)
Hemoglobin: 13.8 g/dL (ref 12.0–15.0)
Immature Granulocytes: 1 %
Lymphocytes Relative: 28 %
Lymphs Abs: 2.9 10*3/uL (ref 0.7–4.0)
MCH: 29.2 pg (ref 26.0–34.0)
MCHC: 32 g/dL (ref 30.0–36.0)
MCV: 91.1 fL (ref 80.0–100.0)
Monocytes Absolute: 0.7 10*3/uL (ref 0.1–1.0)
Monocytes Relative: 7 %
Neutro Abs: 6.4 10*3/uL (ref 1.7–7.7)
Neutrophils Relative %: 62 %
Platelets: 260 10*3/uL (ref 150–400)
RBC: 4.73 MIL/uL (ref 3.87–5.11)
RDW: 13.7 % (ref 11.5–15.5)
WBC: 10.3 10*3/uL (ref 4.0–10.5)
nRBC: 0 % (ref 0.0–0.2)

## 2022-01-17 LAB — HEMOGLOBIN A1C
Hgb A1c MFr Bld: 7.9 % — ABNORMAL HIGH (ref 4.8–5.6)
Mean Plasma Glucose: 180 mg/dL

## 2022-01-18 ENCOUNTER — Ambulatory Visit (HOSPITAL_COMMUNITY): Payer: Medicare Other | Admitting: Anesthesiology

## 2022-01-18 ENCOUNTER — Encounter (HOSPITAL_COMMUNITY): Payer: Self-pay | Admitting: General Surgery

## 2022-01-18 ENCOUNTER — Encounter (HOSPITAL_COMMUNITY)
Admission: AD | Disposition: A | Discharge status: Home / Self Care | Payer: Self-pay | Source: Home / Self Care | Attending: General Surgery

## 2022-01-18 ENCOUNTER — Inpatient Hospital Stay (HOSPITAL_COMMUNITY)
Admission: AD | Admit: 2022-01-18 | Discharge: 2022-01-23 | DRG: 354 | Disposition: A | Discharge status: Home / Self Care | Payer: Medicare Other | Attending: General Surgery | Admitting: General Surgery

## 2022-01-18 ENCOUNTER — Ambulatory Visit (HOSPITAL_BASED_OUTPATIENT_CLINIC_OR_DEPARTMENT_OTHER): Payer: Medicare Other | Admitting: Anesthesiology

## 2022-01-18 ENCOUNTER — Other Ambulatory Visit: Payer: Self-pay

## 2022-01-18 DIAGNOSIS — Z01818 Encounter for other preprocedural examination: Principal | ICD-10-CM

## 2022-01-18 DIAGNOSIS — D72829 Elevated white blood cell count, unspecified: Secondary | ICD-10-CM | POA: Diagnosis not present

## 2022-01-18 DIAGNOSIS — K6389 Other specified diseases of intestine: Secondary | ICD-10-CM | POA: Diagnosis not present

## 2022-01-18 DIAGNOSIS — M1990 Unspecified osteoarthritis, unspecified site: Secondary | ICD-10-CM | POA: Diagnosis not present

## 2022-01-18 DIAGNOSIS — E1165 Type 2 diabetes mellitus with hyperglycemia: Secondary | ICD-10-CM

## 2022-01-18 DIAGNOSIS — K30 Functional dyspepsia: Secondary | ICD-10-CM | POA: Diagnosis not present

## 2022-01-18 DIAGNOSIS — J449 Chronic obstructive pulmonary disease, unspecified: Secondary | ICD-10-CM | POA: Diagnosis present

## 2022-01-18 DIAGNOSIS — Z794 Long term (current) use of insulin: Secondary | ICD-10-CM

## 2022-01-18 DIAGNOSIS — R079 Chest pain, unspecified: Secondary | ICD-10-CM | POA: Diagnosis not present

## 2022-01-18 DIAGNOSIS — Z90710 Acquired absence of both cervix and uterus: Secondary | ICD-10-CM

## 2022-01-18 DIAGNOSIS — E119 Type 2 diabetes mellitus without complications: Secondary | ICD-10-CM | POA: Diagnosis not present

## 2022-01-18 DIAGNOSIS — K567 Ileus, unspecified: Secondary | ICD-10-CM | POA: Diagnosis not present

## 2022-01-18 DIAGNOSIS — I10 Essential (primary) hypertension: Secondary | ICD-10-CM | POA: Diagnosis present

## 2022-01-18 DIAGNOSIS — Z833 Family history of diabetes mellitus: Secondary | ICD-10-CM

## 2022-01-18 DIAGNOSIS — Z803 Family history of malignant neoplasm of breast: Secondary | ICD-10-CM

## 2022-01-18 DIAGNOSIS — Z8249 Family history of ischemic heart disease and other diseases of the circulatory system: Secondary | ICD-10-CM | POA: Diagnosis not present

## 2022-01-18 DIAGNOSIS — Z823 Family history of stroke: Secondary | ICD-10-CM | POA: Diagnosis not present

## 2022-01-18 DIAGNOSIS — Z981 Arthrodesis status: Secondary | ICD-10-CM | POA: Diagnosis not present

## 2022-01-18 DIAGNOSIS — K432 Incisional hernia without obstruction or gangrene: Principal | ICD-10-CM | POA: Diagnosis present

## 2022-01-18 DIAGNOSIS — K439 Ventral hernia without obstruction or gangrene: Secondary | ICD-10-CM | POA: Diagnosis present

## 2022-01-18 DIAGNOSIS — E785 Hyperlipidemia, unspecified: Secondary | ICD-10-CM | POA: Diagnosis not present

## 2022-01-18 DIAGNOSIS — Z7984 Long term (current) use of oral hypoglycemic drugs: Secondary | ICD-10-CM

## 2022-01-18 DIAGNOSIS — Z801 Family history of malignant neoplasm of trachea, bronchus and lung: Secondary | ICD-10-CM | POA: Diagnosis not present

## 2022-01-18 DIAGNOSIS — K219 Gastro-esophageal reflux disease without esophagitis: Secondary | ICD-10-CM | POA: Diagnosis present

## 2022-01-18 DIAGNOSIS — T83728A Exposure of other implanted mesh and other prosthetic materials to surrounding organ or tissue, initial encounter: Secondary | ICD-10-CM | POA: Diagnosis not present

## 2022-01-18 DIAGNOSIS — K668 Other specified disorders of peritoneum: Secondary | ICD-10-CM | POA: Diagnosis not present

## 2022-01-18 DIAGNOSIS — J45909 Unspecified asthma, uncomplicated: Secondary | ICD-10-CM | POA: Diagnosis not present

## 2022-01-18 DIAGNOSIS — K659 Peritonitis, unspecified: Secondary | ICD-10-CM | POA: Diagnosis not present

## 2022-01-18 HISTORY — PX: EXCISION OF MESH: SHX6268

## 2022-01-18 HISTORY — PX: INCISIONAL HERNIA REPAIR: SHX193

## 2022-01-18 LAB — GLUCOSE, CAPILLARY
Glucose-Capillary: 314 mg/dL — ABNORMAL HIGH (ref 70–99)
Glucose-Capillary: 376 mg/dL — ABNORMAL HIGH (ref 70–99)
Glucose-Capillary: 282 mg/dL — ABNORMAL HIGH (ref 70–99)
Glucose-Capillary: 269 mg/dL — ABNORMAL HIGH (ref 70–99)
Glucose-Capillary: 207 mg/dL — ABNORMAL HIGH (ref 70–99)
Glucose-Capillary: 209 mg/dL — ABNORMAL HIGH (ref 70–99)

## 2022-01-18 SURGERY — REPAIR, HERNIA, INCISIONAL
Anesthesia: General | Site: Abdomen

## 2022-01-18 MED ORDER — SIMETHICONE 80 MG PO CHEW
40.0000 mg | CHEWABLE_TABLET | Freq: Four times a day (QID) | ORAL | Status: DC | PRN
  Administered 2022-01-21 – 2022-01-22 (×2): 40 mg via ORAL
  Filled 2022-01-18 (×2): qty 1

## 2022-01-18 MED ORDER — ISOSORBIDE MONONITRATE ER 30 MG PO TB24
15.0000 mg | ORAL_TABLET | Freq: Every day | ORAL | Status: DC
  Administered 2022-01-19 – 2022-01-23 (×4): 15 mg via ORAL
  Filled 2022-01-18 (×5): qty 1

## 2022-01-18 MED ORDER — ONDANSETRON 4 MG PO TBDP
4.0000 mg | ORAL_TABLET | Freq: Four times a day (QID) | ORAL | Status: DC | PRN
  Administered 2022-01-20 – 2022-01-23 (×3): 4 mg via ORAL
  Filled 2022-01-18 (×3): qty 1

## 2022-01-18 MED ORDER — PANTOPRAZOLE SODIUM 40 MG PO TBEC
40.0000 mg | DELAYED_RELEASE_TABLET | Freq: Every day | ORAL | Status: DC
  Administered 2022-01-18 – 2022-01-23 (×6): 40 mg via ORAL
  Filled 2022-01-18 (×5): qty 1

## 2022-01-18 MED ORDER — HYDROCHLOROTHIAZIDE 12.5 MG PO TABS
12.5000 mg | ORAL_TABLET | Freq: Every day | ORAL | Status: DC
  Administered 2022-01-19 – 2022-01-23 (×4): 12.5 mg via ORAL
  Filled 2022-01-18 (×5): qty 1

## 2022-01-18 MED ORDER — FENTANYL CITRATE (PF) 100 MCG/2ML IJ SOLN
INTRAMUSCULAR | Status: DC | PRN
  Administered 2022-01-18 (×2): 50 ug via INTRAVENOUS

## 2022-01-18 MED ORDER — EPHEDRINE SULFATE-NACL 50-0.9 MG/10ML-% IV SOSY
PREFILLED_SYRINGE | INTRAVENOUS | Status: DC | PRN
  Administered 2022-01-18: 5 mg via INTRAVENOUS

## 2022-01-18 MED ORDER — ATORVASTATIN CALCIUM 40 MG PO TABS
40.0000 mg | ORAL_TABLET | Freq: Every day | ORAL | Status: DC
  Administered 2022-01-18 – 2022-01-23 (×5): 40 mg via ORAL
  Filled 2022-01-18 (×6): qty 1

## 2022-01-18 MED ORDER — INSULIN ASPART 100 UNIT/ML IJ SOLN
10.0000 [IU] | Freq: Once | INTRAMUSCULAR | Status: AC
  Administered 2022-01-18: 10 [IU] via SUBCUTANEOUS
  Filled 2022-01-18: qty 0.1

## 2022-01-18 MED ORDER — PROPOFOL 10 MG/ML IV BOLUS
INTRAVENOUS | Status: DC | PRN
  Administered 2022-01-18: 150 mg via INTRAVENOUS

## 2022-01-18 MED ORDER — LOSARTAN POTASSIUM 50 MG PO TABS
50.0000 mg | ORAL_TABLET | Freq: Every day | ORAL | Status: DC
  Administered 2022-01-19 – 2022-01-23 (×4): 50 mg via ORAL
  Filled 2022-01-18 (×5): qty 1

## 2022-01-18 MED ORDER — DIPHENHYDRAMINE HCL 12.5 MG/5ML PO ELIX
12.5000 mg | ORAL_SOLUTION | Freq: Four times a day (QID) | ORAL | Status: DC | PRN
  Administered 2022-01-20 – 2022-01-21 (×3): 12.5 mg via ORAL
  Filled 2022-01-18 (×3): qty 5

## 2022-01-18 MED ORDER — BISACODYL 10 MG RE SUPP
10.0000 mg | Freq: Every day | RECTAL | Status: DC | PRN
  Administered 2022-01-18: 10 mg via RECTAL
  Filled 2022-01-18: qty 1

## 2022-01-18 MED ORDER — LIDOCAINE HCL (PF) 2 % IJ SOLN
INTRAMUSCULAR | Status: AC
  Filled 2022-01-18: qty 5

## 2022-01-18 MED ORDER — BUPIVACAINE LIPOSOME 1.3 % IJ SUSP
INTRAMUSCULAR | Status: DC | PRN
Start: 2022-01-18 — End: 2022-01-18
  Administered 2022-01-18: 20 mL

## 2022-01-18 MED ORDER — CHLORHEXIDINE GLUCONATE CLOTH 2 % EX PADS: 6.0000 | MEDICATED_PAD | Freq: Once | CUTANEOUS | Status: DC

## 2022-01-18 MED ORDER — INSULIN ASPART PROT & ASPART (70-30 MIX) 100 UNIT/ML ~~LOC~~ SUSP
30.0000 [IU] | Freq: Two times a day (BID) | SUBCUTANEOUS | Status: DC
  Administered 2022-01-18 – 2022-01-19 (×2): 30 [IU] via SUBCUTANEOUS
  Filled 2022-01-18: qty 10

## 2022-01-18 MED ORDER — PHENYLEPHRINE 40 MCG/ML (10ML) SYRINGE FOR IV PUSH (FOR BLOOD PRESSURE SUPPORT)
PREFILLED_SYRINGE | INTRAVENOUS | Status: DC | PRN
  Administered 2022-01-18: 80 ug via INTRAVENOUS
  Administered 2022-01-18 (×2): 40 ug via INTRAVENOUS
  Administered 2022-01-18: 20 ug via INTRAVENOUS
  Administered 2022-01-18: 80 ug via INTRAVENOUS
  Administered 2022-01-18: 40 ug via INTRAVENOUS
  Administered 2022-01-18: 20 ug via INTRAVENOUS
  Administered 2022-01-18: 40 ug via INTRAVENOUS
  Administered 2022-01-18: 20 ug via INTRAVENOUS
  Administered 2022-01-18 (×2): 80 ug via INTRAVENOUS
  Administered 2022-01-18: 40 ug via INTRAVENOUS
  Administered 2022-01-18 (×2): 80 ug via INTRAVENOUS
  Administered 2022-01-18: 20 ug via INTRAVENOUS
  Administered 2022-01-18: 40 ug via INTRAVENOUS

## 2022-01-18 MED ORDER — LACTATED RINGERS IV SOLN: INTRAVENOUS | Status: DC

## 2022-01-18 MED ORDER — INSULIN ASPART 100 UNIT/ML IJ SOLN
0.0000 [IU] | Freq: Three times a day (TID) | INTRAMUSCULAR | Status: DC
  Administered 2022-01-18: 5 [IU] via SUBCUTANEOUS
  Administered 2022-01-18: 8 [IU] via SUBCUTANEOUS
  Administered 2022-01-19: 11 [IU] via SUBCUTANEOUS
  Administered 2022-01-19: 5 [IU] via SUBCUTANEOUS
  Administered 2022-01-19: 3 [IU] via SUBCUTANEOUS
  Administered 2022-01-20: 5 [IU] via SUBCUTANEOUS
  Administered 2022-01-21: 2 [IU] via SUBCUTANEOUS
  Administered 2022-01-21: 3 [IU] via SUBCUTANEOUS
  Administered 2022-01-21 – 2022-01-22 (×2): 2 [IU] via SUBCUTANEOUS
  Administered 2022-01-22 – 2022-01-23 (×2): 3 [IU] via SUBCUTANEOUS

## 2022-01-18 MED ORDER — FLUOROMETHOLONE 0.1 % OP SUSP: 1.0000 [drp] | Freq: Every day | OPHTHALMIC | Status: DC | PRN

## 2022-01-18 MED ORDER — ZOLPIDEM TARTRATE 5 MG PO TABS: 5.0000 mg | ORAL_TABLET | Freq: Every evening | ORAL | Status: DC | PRN

## 2022-01-18 MED ORDER — DOCUSATE SODIUM 100 MG PO CAPS
100.0000 mg | ORAL_CAPSULE | Freq: Two times a day (BID) | ORAL | Status: DC
  Administered 2022-01-18 – 2022-01-22 (×8): 100 mg via ORAL
  Filled 2022-01-18 (×10): qty 1

## 2022-01-18 MED ORDER — ONDANSETRON HCL 4 MG/2ML IJ SOLN
4.0000 mg | Freq: Four times a day (QID) | INTRAMUSCULAR | Status: DC | PRN
  Administered 2022-01-21 (×2): 4 mg via INTRAVENOUS
  Filled 2022-01-18 (×2): qty 2

## 2022-01-18 MED ORDER — PHENYLEPHRINE HCL (PRESSORS) 10 MG/ML IV SOLN
INTRAVENOUS | Status: AC
Start: 2022-01-18 — End: ?
  Filled 2022-01-18: qty 1

## 2022-01-18 MED ORDER — ROCURONIUM BROMIDE 10 MG/ML (PF) SYRINGE
PREFILLED_SYRINGE | INTRAVENOUS | Status: DC | PRN
  Administered 2022-01-18: 30 mg via INTRAVENOUS
  Administered 2022-01-18: 50 mg via INTRAVENOUS
  Administered 2022-01-18: 20 mg via INTRAVENOUS
  Administered 2022-01-18: 10 mg via INTRAVENOUS

## 2022-01-18 MED ORDER — AMLODIPINE BESYLATE 5 MG PO TABS
10.0000 mg | ORAL_TABLET | Freq: Every day | ORAL | Status: DC
  Administered 2022-01-19 – 2022-01-23 (×4): 10 mg via ORAL
  Filled 2022-01-18 (×5): qty 2

## 2022-01-18 MED ORDER — BUPIVACAINE LIPOSOME 1.3 % IJ SUSP
INTRAMUSCULAR | Status: AC
  Filled 2022-01-18: qty 20

## 2022-01-18 MED ORDER — PROPOFOL 10 MG/ML IV BOLUS
INTRAVENOUS | Status: AC
  Filled 2022-01-18: qty 20

## 2022-01-18 MED ORDER — METHOCARBAMOL 500 MG PO TABS: 500.0000 mg | ORAL_TABLET | Freq: Three times a day (TID) | ORAL | Status: DC | PRN

## 2022-01-18 MED ORDER — ONDANSETRON HCL 4 MG/2ML IJ SOLN
INTRAMUSCULAR | Status: DC | PRN
  Administered 2022-01-18: 4 mg via INTRAVENOUS

## 2022-01-18 MED ORDER — METOPROLOL TARTRATE 5 MG/5ML IV SOLN: 5.0000 mg | Freq: Four times a day (QID) | INTRAVENOUS | Status: DC | PRN

## 2022-01-18 MED ORDER — ROCURONIUM BROMIDE 10 MG/ML (PF) SYRINGE
PREFILLED_SYRINGE | INTRAVENOUS | Status: AC
  Filled 2022-01-18: qty 10

## 2022-01-18 MED ORDER — DEXAMETHASONE SODIUM PHOSPHATE 10 MG/ML IJ SOLN
INTRAMUSCULAR | Status: AC
  Filled 2022-01-18: qty 1

## 2022-01-18 MED ORDER — LIDOCAINE HCL (CARDIAC) PF 100 MG/5ML IV SOSY
PREFILLED_SYRINGE | INTRAVENOUS | Status: DC | PRN
  Administered 2022-01-18: 60 mg via INTRAVENOUS

## 2022-01-18 MED ORDER — ACETAMINOPHEN 500 MG PO TABS
1000.0000 mg | ORAL_TABLET | Freq: Four times a day (QID) | ORAL | Status: DC
  Administered 2022-01-18 – 2022-01-21 (×13): 1000 mg via ORAL
  Filled 2022-01-18 (×16): qty 2

## 2022-01-18 MED ORDER — NITROGLYCERIN 0.4 MG SL SUBL: 0.4000 mg | SUBLINGUAL_TABLET | SUBLINGUAL | Status: DC | PRN

## 2022-01-18 MED ORDER — HYDROMORPHONE HCL 1 MG/ML IJ SOLN
0.2500 mg | INTRAMUSCULAR | Status: DC | PRN
  Administered 2022-01-18: 0.5 mg via INTRAVENOUS
  Filled 2022-01-18: qty 0.5

## 2022-01-18 MED ORDER — ONDANSETRON HCL 4 MG/2ML IJ SOLN
INTRAMUSCULAR | Status: AC
  Filled 2022-01-18: qty 2

## 2022-01-18 MED ORDER — DIPHENHYDRAMINE HCL 50 MG/ML IJ SOLN: 12.5000 mg | Freq: Four times a day (QID) | INTRAMUSCULAR | Status: DC | PRN

## 2022-01-18 MED ORDER — OXYCODONE HCL 5 MG PO TABS
5.0000 mg | ORAL_TABLET | ORAL | Status: DC | PRN
  Administered 2022-01-18 – 2022-01-20 (×2): 5 mg via ORAL
  Filled 2022-01-18 (×2): qty 1

## 2022-01-18 MED ORDER — ONDANSETRON HCL 4 MG/2ML IJ SOLN: 4.0000 mg | Freq: Once | INTRAMUSCULAR | Status: DC | PRN

## 2022-01-18 MED ORDER — ORAL CARE MOUTH RINSE: 15.0000 mL | Freq: Once | OROMUCOSAL | Status: AC

## 2022-01-18 MED ORDER — POLYETHYLENE GLYCOL 3350 17 G PO PACK
17.0000 g | PACK | Freq: Every day | ORAL | Status: DC
  Administered 2022-01-18 – 2022-01-19 (×2): 17 g via ORAL
  Filled 2022-01-18 (×2): qty 1

## 2022-01-18 MED ORDER — FENTANYL CITRATE (PF) 100 MCG/2ML IJ SOLN
INTRAMUSCULAR | Status: AC
  Filled 2022-01-18: qty 2

## 2022-01-18 MED ORDER — SODIUM CHLORIDE 0.9 % IV SOLN
2.0000 g | INTRAVENOUS | Status: AC
  Administered 2022-01-18: 2 g via INTRAVENOUS
  Filled 2022-01-18: qty 2

## 2022-01-18 MED ORDER — SUGAMMADEX SODIUM 200 MG/2ML IV SOLN
INTRAVENOUS | Status: DC | PRN
  Administered 2022-01-18: 200 mg via INTRAVENOUS

## 2022-01-18 MED ORDER — MORPHINE SULFATE (PF) 2 MG/ML IV SOLN
2.0000 mg | INTRAVENOUS | Status: DC | PRN
  Administered 2022-01-18 – 2022-01-22 (×3): 2 mg via INTRAVENOUS
  Filled 2022-01-18 (×3): qty 1

## 2022-01-18 MED ORDER — 0.9 % SODIUM CHLORIDE (POUR BTL) OPTIME
TOPICAL | Status: DC | PRN
  Administered 2022-01-18 (×2): 1000 mL

## 2022-01-18 MED ORDER — PHENYLEPHRINE HCL (PRESSORS) 10 MG/ML IV SOLN
INTRAVENOUS | Status: AC
  Filled 2022-01-18: qty 1

## 2022-01-18 MED ORDER — CHLORHEXIDINE GLUCONATE 0.12 % MT SOLN
15.0000 mL | Freq: Once | OROMUCOSAL | Status: AC
  Administered 2022-01-18: 15 mL via OROMUCOSAL

## 2022-01-18 SURGICAL SUPPLY — 54 items
ADH SKN CLS APL DERMABOND .7 (GAUZE/BANDAGES/DRESSINGS) ×1
APL PRP STRL LF DISP 70% ISPRP (MISCELLANEOUS) ×1
BINDER ABDOMINAL 12 ML 46-62 (SOFTGOODS) ×1 IMPLANT
BLADE SURG SZ11 CARB STEEL (BLADE) ×2 IMPLANT
CHLORAPREP W/TINT 26 (MISCELLANEOUS) ×2 IMPLANT
CLOTH BEACON ORANGE TIMEOUT ST (SAFETY) ×2 IMPLANT
COVER LIGHT HANDLE STERIS (MISCELLANEOUS) ×4 IMPLANT
DECANTER SPIKE VIAL GLASS SM (MISCELLANEOUS) IMPLANT
DERMABOND ADVANCED (GAUZE/BANDAGES/DRESSINGS) ×1
DERMABOND ADVANCED .7 DNX12 (GAUZE/BANDAGES/DRESSINGS) ×1 IMPLANT
DRSG OPSITE POSTOP 4X10 (GAUZE/BANDAGES/DRESSINGS) ×1 IMPLANT
ELECT REM PT RETURN 9FT ADLT (ELECTROSURGICAL) ×2
ELECTRODE REM PT RTRN 9FT ADLT (ELECTROSURGICAL) ×1 IMPLANT
GAUZE 4X4 16PLY ~~LOC~~+RFID DBL (SPONGE) ×1 IMPLANT
GAUZE SPONGE 4X4 12PLY STRL (GAUZE/BANDAGES/DRESSINGS) ×2 IMPLANT
GLOVE SURG ENC MOIS LTX SZ6.5 (GLOVE) ×2 IMPLANT
GLOVE SURG POLYISO LF SZ7 (GLOVE) ×1 IMPLANT
GLOVE SURG POLYISO LF SZ7.5 (GLOVE) ×2 IMPLANT
GLOVE SURG UNDER POLY LF SZ7 (GLOVE) ×4 IMPLANT
GLOVE SURG UNDER POLY LF SZ7.5 (GLOVE) ×1 IMPLANT
GOWN STRL REUS W/TWL LRG LVL3 (GOWN DISPOSABLE) ×6 IMPLANT
INST SET MAJOR GENERAL (KITS) IMPLANT
INST SET MINOR GENERAL (KITS) ×2 IMPLANT
KIT TURNOVER KIT A (KITS) ×2 IMPLANT
LIGASURE IMPACT 36 18CM CVD LR (INSTRUMENTS) ×1 IMPLANT
MANIFOLD NEPTUNE II (INSTRUMENTS) ×2 IMPLANT
MESH VENTRALIGHT ST 4X6IN (Mesh General) ×1 IMPLANT
NDL HYPO 21X1.5 SAFETY (NEEDLE) ×1 IMPLANT
NEEDLE HYPO 21X1.5 SAFETY (NEEDLE) ×2 IMPLANT
NS IRRIG 1000ML POUR BTL (IV SOLUTION) ×2 IMPLANT
PACK MAJOR ABDOMINAL (CUSTOM PROCEDURE TRAY) IMPLANT
PACK MINOR (CUSTOM PROCEDURE TRAY) ×2 IMPLANT
PAD ARMBOARD 7.5X6 YLW CONV (MISCELLANEOUS) ×2 IMPLANT
SET BASIN LINEN APH (SET/KITS/TRAYS/PACK) ×2 IMPLANT
SPONGE T-LAP 18X18 ~~LOC~~+RFID (SPONGE) ×1 IMPLANT
STAPLER VISISTAT (STAPLE) ×1 IMPLANT
SUT ETHIBOND 0 MO6 C/R (SUTURE) IMPLANT
SUT MNCRL AB 4-0 PS2 18 (SUTURE) ×2 IMPLANT
SUT NOVA NAB GS-21 1 T12 (SUTURE) IMPLANT
SUT NOVA NAB GS-22 2 2-0 T-19 (SUTURE) IMPLANT
SUT NOVA NAB GS-26 0 60 (SUTURE) IMPLANT
SUT PROLENE 0 CT 1 CR/8 (SUTURE) ×6 IMPLANT
SUT PROLENE 2 0 SH 30 (SUTURE) IMPLANT
SUT SILK 2 0 (SUTURE)
SUT SILK 2-0 18XBRD TIE 12 (SUTURE) IMPLANT
SUT SILK 3 0 (SUTURE)
SUT SILK 3-0 18XBRD TIE 12 (SUTURE) IMPLANT
SUT VIC AB 2-0 CT1 27 (SUTURE) ×2
SUT VIC AB 2-0 CT1 TAPERPNT 27 (SUTURE) ×1 IMPLANT
SUT VIC AB 3-0 SH 27 (SUTURE) ×8
SUT VIC AB 3-0 SH 27X BRD (SUTURE) ×1 IMPLANT
SUT VIC AB 4-0 PS2 27 (SUTURE) IMPLANT
SUT VICRYL AB 2 0 TIES (SUTURE) IMPLANT
SYR 20ML LL LF (SYRINGE) ×2 IMPLANT

## 2022-01-18 NOTE — Transfer of Care (Signed)
Immediate Anesthesia Transfer of Care Note ? ?Patient: [name] ? ?Procedure(s) Performed: HERNIA REPAIR INCISIONAL W/ MESH (Abdomen) ?PARTIAL EXCISION OF PRIOR MESH (Abdomen) ? ?Patient Location: PACU ? ?Anesthesia Type:General ? ?Level of Consciousness: awake ? ?Airway & Oxygen Therapy: Patient Spontanous Breathing and Patient connected to face mask oxygen ? ?Post-op Assessment: Report given to RN and Post -op Vital signs reviewed and stable ? ?Post vital signs: Reviewed and stable ? ?Last Vitals:  ?Vitals Value Taken Time  ?BP    ?Temp    ?Pulse 90 01/18/22 1031  ?Resp 22 01/18/22 1031  ?SpO2 94 % 01/18/22 1031  ?Vitals shown include unvalidated device data. ? ?Last Pain:  ?Vitals:  ? 01/18/22 0648  ?TempSrc: Oral  ?PainSc: 0-No pain  ?   ? ?Patients Stated Pain Goal: 5 (01/18/22 6789) ? ?Complications: No notable events documented. ?

## 2022-01-18 NOTE — Progress Notes (Signed)
[facility] ? ?Updated family that surgery went well. Longer than expected. Plan for her to stay overnight. ? ?Binder ?PRN for pain ?IS, OOB ?Diet ?SSI and meal time insulin ordered ( 30 down from her home 45) ?Labs in AM ?LR @ 50 ?SCDs ?Bowel regimen to get her to have BM ? ?RN will update family once she gets a room ? ?Malva Cogan [phone] first cousin ? ?[name], MD ?[facility] ?[address] [place] [address] ?[place], [place] [address] ?[phone] (office) ? ?

## 2022-01-18 NOTE — Interval H&P Note (Signed)
History and Physical Interval Note: ? ?01/18/2022 ?7:23 AM ? ?[name]  has presented today for surgery, with the diagnosis of Incisional Hernia 3cm.  The various methods of treatment have been discussed with the patient and family. After consideration of risks, benefits and other options for treatment, the patient has consented to  Procedure(s): ?HERNIA REPAIR INCISIONAL W/ MESH (N/A) as a surgical intervention.  The patient's history has been reviewed, patient examined, no change in status, stable for surgery.  I have reviewed the patient's chart and labs.  Questions were answered to the patient's satisfaction.   ? ?Been constipated but having alot gas. No BM in a few days. Has been taking miralax. No issues with nausea/vomiting or issues with eating. Recommended suppository and maybe MOM.  ? ?Lucretia Roers ? ? ?

## 2022-01-18 NOTE — Anesthesia Procedure Notes (Signed)
Procedure Name: Intubation ?Date/Time: 01/18/2022 7:36 AM ?Performed by: Lieutenant Diego, CRNA ?Pre-anesthesia Checklist: Patient identified, Emergency Drugs available, Suction available and Patient being monitored ?Patient Re-evaluated:Patient Re-evaluated prior to induction ?Oxygen Delivery Method: Circle system utilized ?Preoxygenation: Pre-oxygenation with 100% oxygen ?Induction Type: IV induction ?Ventilation: Mask ventilation without difficulty ?Laryngoscope Size: Sabra Heck and 2 ?Grade View: Grade I ?Tube type: Oral ?Tube size: 7.0 mm ?Number of attempts: 1 ?Airway Equipment and Method: Stylet ?Placement Confirmation: ETT inserted through vocal cords under direct vision, positive ETCO2 and breath sounds checked- equal and bilateral ?Secured at: 23 cm ?Tube secured with: Tape ?Dental Injury: Teeth and Oropharynx as per pre-operative assessment  ? ? ? ? ?

## 2022-01-18 NOTE — Anesthesia Preprocedure Evaluation (Signed)
Anesthesia Evaluation  ?Patient identified by MRN, date of birth, ID band ?Patient awake ? ? ? ?Reviewed: ?Allergy & Precautions, NPO status , Patient's Chart, lab work & pertinent test results ? ?Airway ?Mallampati: II ? ?TM Distance: >3 FB ?Neck ROM: Full ? ? ? Dental ? ?(+) Edentulous Upper, Edentulous Lower ?  ?Pulmonary ?asthma , COPD,  ?  ?Pulmonary exam normal ?breath sounds clear to auscultation ? ? ? ? ? ? Cardiovascular ?Exercise Tolerance: Good ?hypertension, Pt. on medications ?Normal cardiovascular exam ?Rhythm:Regular Rate:Normal ? ? ?  ?Neuro/Psych ? Headaches,  Neuromuscular disease negative psych ROS  ? GI/Hepatic ?Neg liver ROS, GERD  Controlled and Medicated,  ?Endo/Other  ?diabetes (did not take her insulin last night), Poorly Controlled, Type 2, Insulin Dependent, Oral Hypoglycemic Agents ? Renal/GU ?negative Renal ROS  ?negative genitourinary ?  ?Musculoskeletal ? ?(+) Arthritis , Osteoarthritis,   ? Abdominal ?  ?Peds ?negative pediatric ROS ?(+)  Hematology ? ?(+) Blood dyscrasia, anemia ,   ?Anesthesia Other Findings ? ? Reproductive/Obstetrics ?negative OB ROS ? ?  ? ? ? ? ? ? ? ? ? ? ? ? ? ?  ?  ? ? ? ? ? ? ?Anesthesia Physical ?Anesthesia Plan ? ?ASA: 3 ? ?Anesthesia Plan: General  ? ?Post-op Pain Management: Dilaudid IV  ? ?Induction: Intravenous ? ?PONV Risk Score and Plan: 4 or greater and Ondansetron ? ?Airway Management Planned: Oral ETT ? ?Additional Equipment:  ? ?Intra-op Plan:  ? ?Post-operative Plan: Extubation in OR ? ?Informed Consent: I have reviewed the patients History and Physical, chart, labs and discussed the procedure including the risks, benefits and alternatives for the proposed anesthesia with the patient or authorized representative who has indicated his/her understanding and acceptance.  ? ? ? ?Dental advisory given ? ?Plan Discussed with: CRNA and Surgeon ? ?Anesthesia Plan Comments:   ? ? ? ? ? ?Anesthesia Quick Evaluation ? ?

## 2022-01-18 NOTE — Anesthesia Postprocedure Evaluation (Signed)
Anesthesia Post Note ? ?Patient: MICHELINA MEXICANO ? ?Procedure(s) Performed: HERNIA REPAIR INCISIONAL W/ MESH (Abdomen) ?PARTIAL EXCISION OF PRIOR MESH (Abdomen) ? ?Patient location during evaluation: Phase II ?Anesthesia Type: General ?Level of consciousness: awake and alert and oriented ?Pain management: pain level controlled ?Vital Signs Assessment: post-procedure vital signs reviewed and stable ?Respiratory status: spontaneous breathing, nonlabored ventilation and respiratory function stable ?Cardiovascular status: blood pressure returned to baseline and stable ?Postop Assessment: no apparent nausea or vomiting ?Anesthetic complications: no ? ? ?No notable events documented. ? ? ?Last Vitals:  ?Vitals:  ? 01/18/22 0648 01/18/22 1032  ?BP: (!) 152/67   ?Pulse: 84   ?Resp: 18   ?Temp: 37.1 ?C 36.9 ?C  ?SpO2: 97%   ?  ?Last Pain:  ?Vitals:  ? 01/18/22 1032  ?TempSrc:   ?PainSc: 0-No pain  ? ? ?  ?  ?  ?  ?  ?  ? ?Crystle Carelli C Amillion Macchia ? ? ? ? ?

## 2022-01-18 NOTE — Op Note (Addendum)
[facility] ?Operative Note ? ?01/18/22 ? ?Preoperative Diagnosis: Recurrent Incisional hernia  ?  ?Postoperative Diagnosis: Recurrent incisional hernia, unincorporated mesh ?  ?Procedure(s) Performed: Repair of incisional hernia and excisional debridement of subcutaneous tissue, skin, mesh/fascia with explantation of part of prior mesh total area 20 sq cm; defect size after debridement 6 cm X4cm; implantation of Ventralight ST 10.2X15.2 cm mesh (long axis horizontal)  ?  ?Surgeon: Angela Jewels. Henreitta Leber, MD ?  ?Assistants: No qualified resident was available  ?  ?Anesthesia: General endotracheal ?  ?Anesthesiologist: Dr. Pilar Plate ?  ?Specimens:  Omentum and mesh explant ?  ?Estimated Blood Loss: Minimal ?  ?Blood Replacement: None  ?  ?Complications: None  ? ?Wound Class: Clean  ?  ?Operative Indications: [name] is a 71 yo with a prior exploration and mesh repair who now has a recurrence on the right lateral edge of the mesh. We discussed exploration and plans for repair with replacement of mesh and discussed risk of bleeding, infection, injury to bowel and needing more extensive surgery.  ? ?Findings: Right lateral edge of mesh dehisced and folded into and incorporated with hernia sac, most of mesh incorporated into thin fascia but some not incorporated and explanted  ?  ?Procedure: The patient was taken to the operating room and placed supine. General endotracheal anesthesia was induced. Intravenous antibiotics were  administered per protocol.  An orogastric tube positioned to decompress the stomach. The abdomen was prepared and draped in the usual sterile fashion.  ? ?Given that her entire abdominal wall in the midline was mesh and possible some very thin fascia, I did not want to go through this layer and instead did an incision right of midline over the hernia defect.  There was extensive scarring and no distinct hernia sac that could be delineated. The great care, I was able to get down  to the fascia/ mesh edge and get into the peritoneal cavity, excising what was partial hernia sac, omentum and explanting some of the lateral mesh that had folded up. This allowed me access into the abdomen and I extended the incision on the skin superior and inferior and extended the defect medial to the lower edge of the prior mesh repair where there was another separated portion of fascia from the mesh.  I cleared all of the bowel omentum off the there prior large mesh, and excised any mesh that was not incorporated mostly being on the inferior edge and the right lateral edge. This left a defect about 6 cm in size horizontal and 4cm in size vertical but with mesh and fascia from the prior repair that could easily be pulled to midline for additional coverage given her laxity.   ? ?I opted to place a new Ventralight ST 10.2X15.2 cm mesh with the long axis horizontal position to cover the right lateral edge and the inferior edge where the defect was. This was tacked to the fascia and fascia/mesh incorporated tissue all around the defect using 0 Prolene sutures. Ensuring no bowel was incorporated and getting a flat covering under the fascia and prior mesh/ fascia incorporation.  There were well over 3-4cm overlap of the new mesh in all directions. I then tacked the mesh to the prior mesh using 0 Prolene preventing any pockets or separation between these two layers. The defect was closed entirely and given the laxity of her abdomen wall, I was able to cover this new mesh with the old mesh/ fascia by bringing the left side across  midline to the right side and this was re-approximated with some overlap with horizontal mattress suture using 0 Prolene. There was no tension. Exparel was injected. The skin and subcutaneous flaps were then created and some skin from the midline was excision on the left side.  The subcutaneous tissue  and dermis were closed with 3-0 Vicryl sutures after irrigation and hemostasis to prevent  seroma or hematoma formation.  The left skin edge was brought over to the right skin edge (the prior right of midline incision edge), and all came to the true midline without tension. Staples were used to closed the skin. A honeycomb dressing was placed. A binder was placed.  ? ? ?Final inspection revealed acceptable hemostasis. All counts were correct at the end of the case. The patient was awakened from anesthesia and extubated without complication.  The patient went to the PACU in stable condition. ?  ?Angela Greenhouse, MD ?[facility] ?[address] ?[place], [place] [address] ?[phone] (office) ? ? ?

## 2022-01-19 DIAGNOSIS — K439 Ventral hernia without obstruction or gangrene: Secondary | ICD-10-CM | POA: Diagnosis not present

## 2022-01-19 LAB — CBC
HCT: 36.4 % (ref 36.0–46.0)
Hemoglobin: 11.4 g/dL — ABNORMAL LOW (ref 12.0–15.0)
MCH: 29.1 pg (ref 26.0–34.0)
MCHC: 31.3 g/dL (ref 30.0–36.0)
MCV: 92.9 fL (ref 80.0–100.0)
Platelets: 208 10*3/uL (ref 150–400)
RBC: 3.92 MIL/uL (ref 3.87–5.11)
RDW: 14.1 % (ref 11.5–15.5)
WBC: 10.2 10*3/uL (ref 4.0–10.5)
nRBC: 0 % (ref 0.0–0.2)

## 2022-01-19 LAB — GLUCOSE, CAPILLARY
Glucose-Capillary: 215 mg/dL — ABNORMAL HIGH (ref 70–99)
Glucose-Capillary: 197 mg/dL — ABNORMAL HIGH (ref 70–99)
Glucose-Capillary: 304 mg/dL — ABNORMAL HIGH (ref 70–99)
Glucose-Capillary: 131 mg/dL — ABNORMAL HIGH (ref 70–99)

## 2022-01-19 LAB — BASIC METABOLIC PANEL
Anion gap: 8 (ref 5–15)
BUN: 13 mg/dL (ref 8–23)
CO2: 26 mmol/L (ref 22–32)
Calcium: 8.4 mg/dL — ABNORMAL LOW (ref 8.9–10.3)
Chloride: 102 mmol/L (ref 98–111)
Creatinine, Ser: 0.87 mg/dL (ref 0.44–1.00)
GFR, Estimated: >60 mL/min (ref >60)
Glucose, Bld: 208 mg/dL — ABNORMAL HIGH (ref 70–99)
Potassium: 4 mmol/L (ref 3.5–5.1)
Sodium: 136 mmol/L (ref 135–145)

## 2022-01-19 LAB — SURGICAL PATHOLOGY

## 2022-01-19 MED ORDER — MAGNESIUM HYDROXIDE 400 MG/5ML PO SUSP
30.0000 mL | Freq: Every day | ORAL | Status: DC
  Administered 2022-01-19 – 2022-01-21 (×3): 30 mL via ORAL
  Filled 2022-01-19 (×5): qty 30

## 2022-01-19 MED ORDER — INSULIN ASPART PROT & ASPART (70-30 MIX) 100 UNIT/ML ~~LOC~~ SUSP
35.0000 [IU] | Freq: Two times a day (BID) | SUBCUTANEOUS | Status: DC
  Administered 2022-01-19 – 2022-01-23 (×6): 35 [IU] via SUBCUTANEOUS
  Filled 2022-01-19: qty 10

## 2022-01-19 NOTE — Progress Notes (Signed)
[facility] Progress Note ? ?1 Day Post-Op  ?Subjective: ?Sore. Has been up in room. Had a small BM X3 but she says nothing major. She is nervous about her Bms.  ? ?Objective: ?Vital signs in last 24 hours: ?Temp:  [97.7 ?F (36.5 ?C)-99.6 ?F (37.6 ?C)] 98.3 ?F (36.8 ?C) (04/07 1231) ?Pulse Rate:  [78-94] 94 (04/07 1231) ?Resp:  [18-20] 18 (04/07 1231) ?BP: (99-118)/(52-60) 118/57 (04/07 1231) ?SpO2:  [96 %-97 %] 97 % (04/07 1231) ?Last BM Date : 01/18/22 ? ?Intake/Output from previous day: ?04/06 0701 - 04/07 0700 ?In: 2829.8 [P.O.:1200; I.V.:1529.8; IV Piggyback:100] ?Out: 210 [Urine:200; Blood:10] ?Intake/Output this shift: ?Total I/O ?In: 480 [P.O.:480] ?Out: 200 [Urine:200] ? ?General appearance: alert and no distress ?GI: soft, tender around incision, mildly distended, no rebound or guarding, binder in place  ? ?Lab Results:  ?Recent Labs  ?  01/19/22 ?0436  ?WBC 10.2  ?HGB 11.4*  ?HCT 36.4  ?PLT 208  ? ?BMET ?Recent Labs  ?  01/19/22 ?0436  ?NA 136  ?K 4.0  ?CL 102  ?CO2 26  ?GLUCOSE 208*  ?BUN 13  ?CREATININE 0.87  ?CALCIUM 8.4*  ? ?PT/INR ?No results for input(s): LABPROT, INR in the last 72 hours. ? ?Studies/Results: ?No results found. ? ?Anti-infectives: ?Anti-infectives (From admission, onward)  ? ? Start     Dose/Rate Route Frequency Ordered Stop  ? 01/18/22 0700  cefoTEtan (CEFOTAN) 2 g in sodium chloride 0.9 % 100 mL IVPB       ? 2 g ?200 mL/hr over 30 Minutes Intravenous On call to O.R. 01/18/22 8657 01/18/22 0807  ? ?  ? ? ?Assessment/Plan: ?Patient s/p hernia repair with mesh. Doing fair but having pain and on O2.  ?Tylenol scheduled, PRN narcotic ?IS needs to be started, OOB and ambulate ?Wean O2 ?HD ok ?Home meds ?Diabetic diet, up the novolog to 35 per Diabetes RN rec ?Labs in AM ?MOM now and prune juice  ?SCDs ? ? LOS: 0 days  ? ? ?[name] ?01/19/2022 ? ?

## 2022-01-19 NOTE — TOC Progression Note (Signed)
? ?  Transition of Care (TOC) Screening Note ? ? ?Patient Details  ?Name: [name] ?Date of Birth: 03/17/1951 ? ? ?Transition of Care (TOC) CM/SW Contact:    ?Leitha Bleak, RN ?Phone Number: ?01/19/2022, 1:06 PM ? ? ? ?Transition of Care Department [facility]) has reviewed patient and no TOC needs have been identified at this time. We will continue to monitor patient advancement through interdisciplinary progression rounds. If new patient transition needs arise, please place a TOC consult. ? ? ?  ?Barriers to Discharge: Continued Medical Work up ?

## 2022-01-19 NOTE — Progress Notes (Signed)
Inpatient Diabetes Program Recommendations ? ?AACE/ADA: New Consensus Statement on Inpatient Glycemic Control (2015) ? ?Target Ranges:  Prepandial:   less than 140 mg/dL ?     Peak postprandial:   less than 180 mg/dL (1-2 hours) ?     Critically ill patients:  140 - 180 mg/dL  ? ?Lab Results  ?Component Value Date  ? GLUCAP 215 (H) 01/19/2022  ? HGBA1C 7.9 (H) 01/16/2022  ? ? ?Review of Glycemic Control ? Latest Reference Range & Units 01/18/22 07:18 01/18/22 10:32 01/18/22 12:03 01/18/22 16:21 01/18/22 20:26 01/19/22 07:21  ?Glucose-Capillary 70 - 99 mg/dL 657 (H) 846 (H) 962 (H) 207 (H) 209 (H) 215 (H)  ? ?Diabetes history: DM 2 ?Outpatient Diabetes medications: 70/30 45 BID, Metformin 1000 QHS, Rybelusus 14 QD ?Current orders for Inpatient glycemic control:  ?70/30 30 units bid ?Novolog 0-15 units tid ? ?Inpatient Diabetes Program Recommendations:   ? ?-  consider increasing 70/30 insulin to 35 units bid ? ?Thanks, ? ?Christena Deem RN, MSN, BC-ADM ?Inpatient Diabetes Coordinator ?Team Pager [phone] (8a-5p) ?

## 2022-01-20 DIAGNOSIS — K439 Ventral hernia without obstruction or gangrene: Secondary | ICD-10-CM | POA: Diagnosis not present

## 2022-01-20 LAB — CBC WITH DIFFERENTIAL/PLATELET
Abs Immature Granulocytes: 0.08 10*3/uL — ABNORMAL HIGH (ref 0.00–0.07)
Basophils Absolute: 0 10*3/uL (ref 0.0–0.1)
Basophils Relative: 0 %
Eosinophils Absolute: 0.4 10*3/uL (ref 0.0–0.5)
Eosinophils Relative: 3 %
HCT: 35.9 % — ABNORMAL LOW (ref 36.0–46.0)
Hemoglobin: 11.2 g/dL — ABNORMAL LOW (ref 12.0–15.0)
Immature Granulocytes: 1 %
Lymphocytes Relative: 16 %
Lymphs Abs: 2 10*3/uL (ref 0.7–4.0)
MCH: 29 pg (ref 26.0–34.0)
MCHC: 31.2 g/dL (ref 30.0–36.0)
MCV: 93 fL (ref 80.0–100.0)
Monocytes Absolute: 0.8 10*3/uL (ref 0.1–1.0)
Monocytes Relative: 6 %
Neutro Abs: 9.1 10*3/uL — ABNORMAL HIGH (ref 1.7–7.7)
Neutrophils Relative %: 74 %
Platelets: 223 10*3/uL (ref 150–400)
RBC: 3.86 MIL/uL — ABNORMAL LOW (ref 3.87–5.11)
RDW: 14.4 % (ref 11.5–15.5)
WBC: 12.3 10*3/uL — ABNORMAL HIGH (ref 4.0–10.5)
nRBC: 0 % (ref 0.0–0.2)

## 2022-01-20 LAB — MAGNESIUM: Magnesium: 1.9 mg/dL (ref 1.7–2.4)

## 2022-01-20 LAB — GLUCOSE, CAPILLARY
Glucose-Capillary: 211 mg/dL — ABNORMAL HIGH (ref 70–99)
Glucose-Capillary: 118 mg/dL — ABNORMAL HIGH (ref 70–99)
Glucose-Capillary: 103 mg/dL — ABNORMAL HIGH (ref 70–99)
Glucose-Capillary: 125 mg/dL — ABNORMAL HIGH (ref 70–99)

## 2022-01-20 LAB — BASIC METABOLIC PANEL
Anion gap: 9 (ref 5–15)
BUN: 13 mg/dL (ref 8–23)
CO2: 28 mmol/L (ref 22–32)
Calcium: 8.8 mg/dL — ABNORMAL LOW (ref 8.9–10.3)
Chloride: 99 mmol/L (ref 98–111)
Creatinine, Ser: 0.93 mg/dL (ref 0.44–1.00)
GFR, Estimated: >60 mL/min (ref >60)
Glucose, Bld: 183 mg/dL — ABNORMAL HIGH (ref 70–99)
Potassium: 3.9 mmol/L (ref 3.5–5.1)
Sodium: 136 mmol/L (ref 135–145)

## 2022-01-20 NOTE — Progress Notes (Signed)
[facility] Progress Note ? ?2 Days Post-Op  ?Subjective: ?More BM but still hurting. Has not been taking roxicodone. Encouraged to take.  ? ?Objective: ?Vital signs in last 24 hours: ?Temp:  [99 ?F (37.2 ?C)-100.2 ?F (37.9 ?C)] 99 ?F (37.2 ?C) (04/08 0444) ?Pulse Rate:  [85-87] 85 (04/08 0444) ?Resp:  [18-20] 18 (04/08 0444) ?BP: (107-126)/(57-61) 126/61 (04/08 0444) ?SpO2:  [90 %-97 %] 90 % (04/08 0444) ?Last BM Date : 01/20/22 ? ?Intake/Output from previous day: ?04/07 0701 - 04/08 0700 ?In: 720 [P.O.:720] ?Out: 300 [Urine:300] ?Intake/Output this shift: ?Total I/O ?In: 120 [P.O.:120] ?Out: -  ? ?General appearance: alert and no distress ?GI: soft, a little distended, tender around the incision, honeycomb in place without erythema noted and no new staining  ? ?Lab Results:  ?Recent Labs  ?  01/19/22 ?0436 01/20/22 ?0425  ?WBC 10.2 12.3*  ?HGB 11.4* 11.2*  ?HCT 36.4 35.9*  ?PLT 208 223  ? ?BMET ?Recent Labs  ?  01/19/22 ?0436 01/20/22 ?0425  ?NA 136 136  ?K 4.0 3.9  ?CL 102 99  ?CO2 26 28  ?GLUCOSE 208* 183*  ?BUN 13 13  ?CREATININE 0.87 0.93  ?CALCIUM 8.4* 8.8*  ? ?PT/INR ?No results for input(s): LABPROT, INR in the last 72 hours. ? ?Studies/Results: ?No results found. ? ?Anti-infectives: ?Anti-infectives (From admission, onward)  ? ? Start     Dose/Rate Route Frequency Ordered Stop  ? 01/18/22 0700  cefoTEtan (CEFOTAN) 2 g in sodium chloride 0.9 % 100 mL IVPB       ? 2 g ?200 mL/hr over 30 Minutes Intravenous On call to O.R. 01/18/22 [id number] 01/18/22 0807  ? ?  ? ? ?Assessment/Plan: ?Patient s/p hernia repair with mesh and partial excision of prior mesh. Doing fair. ?Encouraged to take roxicodone ?Bms now ?Diet ?Will plan for dc tomorrow once pain is more improved  ? ? LOS: 0 days  ? ? ?Virl Cagey ?01/20/2022 ? ?

## 2022-01-21 ENCOUNTER — Observation Stay (HOSPITAL_COMMUNITY): Payer: Medicare Other

## 2022-01-21 DIAGNOSIS — K432 Incisional hernia without obstruction or gangrene: Secondary | ICD-10-CM | POA: Diagnosis present

## 2022-01-21 DIAGNOSIS — J449 Chronic obstructive pulmonary disease, unspecified: Secondary | ICD-10-CM | POA: Diagnosis present

## 2022-01-21 DIAGNOSIS — Z794 Long term (current) use of insulin: Secondary | ICD-10-CM | POA: Diagnosis not present

## 2022-01-21 DIAGNOSIS — E1165 Type 2 diabetes mellitus with hyperglycemia: Secondary | ICD-10-CM | POA: Diagnosis present

## 2022-01-21 DIAGNOSIS — Z981 Arthrodesis status: Secondary | ICD-10-CM | POA: Diagnosis not present

## 2022-01-21 DIAGNOSIS — Z833 Family history of diabetes mellitus: Secondary | ICD-10-CM | POA: Diagnosis not present

## 2022-01-21 DIAGNOSIS — Z7984 Long term (current) use of oral hypoglycemic drugs: Secondary | ICD-10-CM | POA: Diagnosis not present

## 2022-01-21 DIAGNOSIS — Z90710 Acquired absence of both cervix and uterus: Secondary | ICD-10-CM | POA: Diagnosis not present

## 2022-01-21 DIAGNOSIS — E119 Type 2 diabetes mellitus without complications: Secondary | ICD-10-CM | POA: Diagnosis present

## 2022-01-21 DIAGNOSIS — K30 Functional dyspepsia: Secondary | ICD-10-CM | POA: Diagnosis not present

## 2022-01-21 DIAGNOSIS — Z823 Family history of stroke: Secondary | ICD-10-CM | POA: Diagnosis not present

## 2022-01-21 DIAGNOSIS — K219 Gastro-esophageal reflux disease without esophagitis: Secondary | ICD-10-CM | POA: Diagnosis present

## 2022-01-21 DIAGNOSIS — M1990 Unspecified osteoarthritis, unspecified site: Secondary | ICD-10-CM | POA: Diagnosis present

## 2022-01-21 DIAGNOSIS — Z8249 Family history of ischemic heart disease and other diseases of the circulatory system: Secondary | ICD-10-CM | POA: Diagnosis not present

## 2022-01-21 DIAGNOSIS — Z801 Family history of malignant neoplasm of trachea, bronchus and lung: Secondary | ICD-10-CM | POA: Diagnosis not present

## 2022-01-21 DIAGNOSIS — K567 Ileus, unspecified: Secondary | ICD-10-CM | POA: Diagnosis present

## 2022-01-21 DIAGNOSIS — E785 Hyperlipidemia, unspecified: Secondary | ICD-10-CM | POA: Diagnosis present

## 2022-01-21 DIAGNOSIS — I10 Essential (primary) hypertension: Secondary | ICD-10-CM | POA: Diagnosis present

## 2022-01-21 DIAGNOSIS — D72829 Elevated white blood cell count, unspecified: Secondary | ICD-10-CM | POA: Diagnosis not present

## 2022-01-21 DIAGNOSIS — R079 Chest pain, unspecified: Secondary | ICD-10-CM | POA: Diagnosis not present

## 2022-01-21 DIAGNOSIS — K6389 Other specified diseases of intestine: Secondary | ICD-10-CM | POA: Diagnosis not present

## 2022-01-21 DIAGNOSIS — Z803 Family history of malignant neoplasm of breast: Secondary | ICD-10-CM | POA: Diagnosis not present

## 2022-01-21 LAB — TROPONIN I (HIGH SENSITIVITY)
Troponin I (High Sensitivity): 6 ng/L (ref <18)
Troponin I (High Sensitivity): 5 ng/L (ref <18)

## 2022-01-21 LAB — CBC WITH DIFFERENTIAL/PLATELET
Abs Immature Granulocytes: 0.07 10*3/uL (ref 0.00–0.07)
Basophils Absolute: 0 10*3/uL (ref 0.0–0.1)
Basophils Relative: 0 %
Eosinophils Absolute: 0.4 10*3/uL (ref 0.0–0.5)
Eosinophils Relative: 3 %
HCT: 38.6 % (ref 36.0–46.0)
Hemoglobin: 12.2 g/dL (ref 12.0–15.0)
Immature Granulocytes: 1 %
Lymphocytes Relative: 12 %
Lymphs Abs: 1.6 10*3/uL (ref 0.7–4.0)
MCH: 29.3 pg (ref 26.0–34.0)
MCHC: 31.6 g/dL (ref 30.0–36.0)
MCV: 92.8 fL (ref 80.0–100.0)
Monocytes Absolute: 0.8 10*3/uL (ref 0.1–1.0)
Monocytes Relative: 6 %
Neutro Abs: 10.4 10*3/uL — ABNORMAL HIGH (ref 1.7–7.7)
Neutrophils Relative %: 78 %
Platelets: 255 10*3/uL (ref 150–400)
RBC: 4.16 MIL/uL (ref 3.87–5.11)
RDW: 14.3 % (ref 11.5–15.5)
WBC: 13.3 10*3/uL — ABNORMAL HIGH (ref 4.0–10.5)
nRBC: 0 % (ref 0.0–0.2)

## 2022-01-21 LAB — GLUCOSE, CAPILLARY
Glucose-Capillary: 144 mg/dL — ABNORMAL HIGH (ref 70–99)
Glucose-Capillary: 135 mg/dL — ABNORMAL HIGH (ref 70–99)
Glucose-Capillary: 170 mg/dL — ABNORMAL HIGH (ref 70–99)
Glucose-Capillary: 161 mg/dL — ABNORMAL HIGH (ref 70–99)

## 2022-01-21 MED ORDER — ONDANSETRON 4 MG PO TBDP: 4.0000 mg | ORAL_TABLET | Freq: Four times a day (QID) | ORAL | 0 refills | Status: DC | PRN

## 2022-01-21 MED ORDER — TRAMADOL HCL 50 MG PO TABS
50.0000 mg | ORAL_TABLET | Freq: Four times a day (QID) | ORAL | Status: DC | PRN
Start: 2022-01-21 — End: 2022-01-23
  Filled 2022-01-21: qty 1

## 2022-01-21 MED ORDER — SIMETHICONE 80 MG PO CHEW
40.0000 mg | CHEWABLE_TABLET | Freq: Four times a day (QID) | ORAL | 0 refills | Status: AC | PRN
Start: 2022-01-21 — End: ?

## 2022-01-21 MED ORDER — TRAMADOL HCL 50 MG PO TABS: 50.0000 mg | ORAL_TABLET | Freq: Four times a day (QID) | ORAL | 0 refills | Status: DC | PRN

## 2022-01-21 MED ORDER — MAGNESIUM HYDROXIDE 400 MG/5ML PO SUSP: 30.0000 mL | Freq: Every day | ORAL | 0 refills | Status: DC | PRN

## 2022-01-21 MED ORDER — DOCUSATE SODIUM 100 MG PO CAPS: 100.0000 mg | ORAL_CAPSULE | Freq: Two times a day (BID) | ORAL | 0 refills | Status: AC

## 2022-01-21 NOTE — Discharge Summary (Incomplete)
Physician Discharge Summary  ?Patient ID: ?[name] ?MRN: [id number] ?DOB/AGE: [birth year] 71 y.o. ? ?Admit date: 01/18/2022 ?Discharge date: 01/21/2022 ? ?Admission Diagnoses: ? ?Discharge Diagnoses:  ?Principal Problem: ?  Ventral hernia without obstruction or gangrene ? ? ?Discharged Condition: {condition:18240} ? ?Hospital Course: *** ? ?Consults: {consultation:18241} ? ?Significant Diagnostic Studies: {diagnostics:18242} ? ?Treatments: {Tx:18249} ? ?Discharge Exam: ?Blood pressure 136/60, pulse 87, temperature 99.1 ?F (37.3 ?C), temperature source Oral, resp. rate 18, height 5\' 5"  (1.651 m), weight 83.8 kg, SpO2 94 %. ?{physical exam:3041130} ? ?Disposition:  ? ?Discharge Instructions   ? ? Call MD for:  difficulty breathing, headache or visual disturbances   Complete by: As directed ?  ? Call MD for:  extreme fatigue   Complete by: As directed ?  ? Call MD for:  persistant dizziness or light-headedness   Complete by: As directed ?  ? Call MD for:  persistant nausea and vomiting   Complete by: As directed ?  ? Call MD for:  redness, tenderness, or signs of infection (pain, swelling, redness, odor or green/yellow discharge around incision site)   Complete by: As directed ?  ? Call MD for:  severe uncontrolled pain   Complete by: As directed ?  ? Call MD for:  temperature >100.4   Complete by: As directed ?  ? Increase activity slowly   Complete by: As directed ?  ? ?  ? ?Allergies as of 01/21/2022   ?No Known Allergies ?  ? ?  ?Medication List  ?  ? ?STOP taking these medications   ? ?ondansetron 4 MG tablet ?Commonly known as: ZOFRAN ?  ? ?  ? ?TAKE these medications   ? ?acetaminophen 325 MG tablet ?Commonly known as: TYLENOL ?Take 2 tablets (650 mg total) by mouth every 6 (six) hours as needed for mild pain (or Fever >/= 101). ?  ?amLODipine 10 MG tablet ?Commonly known as: NORVASC ?Take 10 mg by mouth daily. ?  ?AquaLance Lancets 30G Misc ?  ?aspirin EC 81 MG tablet ?Take 1 tablet (81 mg total) by mouth  daily with breakfast. ?  ?atorvastatin 40 MG tablet ?Commonly known as: LIPITOR ?Take 40 mg by mouth daily. ?  ?docusate sodium 100 MG capsule ?Commonly known as: COLACE ?Take 1 capsule (100 mg total) by mouth 2 (two) times daily. ?  ?fluorometholone 0.1 % ophthalmic suspension ?Commonly known as: FML ?Place 1 drop into both eyes daily as needed (irritation). ?  ?HumuLIN 70/30 (70-30) 100 UNIT/ML injection ?Generic drug: insulin NPH-regular Human ?Inject 45 Units into the skin 2 (two) times daily with a meal. ?  ?hydrochlorothiazide 12.5 MG capsule ?Commonly known as: MICROZIDE ?Take 1 capsule (12.5 mg total) by mouth daily. ?  ?isosorbide mononitrate 30 MG 24 hr tablet ?Commonly known as: IMDUR ?Take 0.5 tablets (15 mg total) by mouth daily. ?  ?losartan 50 MG tablet ?Commonly known as: COZAAR ?Take 50 mg by mouth daily. ?  ?magnesium hydroxide 400 MG/5ML suspension ?Commonly known as: MILK OF MAGNESIA ?Take 30 mLs by mouth daily as needed for mild constipation or moderate constipation. ?  ?Meloxicam 15 MG Tbdp ?Take 1 tablet by mouth daily with breakfast. ?  ?metFORMIN 1000 MG tablet ?Commonly known as: GLUCOPHAGE ?Take 1,000 mg by mouth at bedtime. ?  ?methocarbamol 500 MG tablet ?Commonly known as: ROBAXIN ?Take 500 mg by mouth every 8 (eight) hours as needed for muscle spasms. ?  ?nitroGLYCERIN 0.4 MG SL tablet ?Commonly known as: NITROSTAT ?Place 1 tablet (  0.4 mg total) under the tongue every 5 (five) minutes as needed for chest pain. Dissolve one tablet under the tongue every 5 mintues as needed for chest pain. ?  ?ondansetron 4 MG disintegrating tablet ?Commonly known as: ZOFRAN-ODT ?Take 1 tablet (4 mg total) by mouth every 6 (six) hours as needed for nausea. ?  ?pantoprazole 40 MG tablet ?Commonly known as: Protonix ?Take 1 tablet (40 mg total) by mouth daily. ?  ?polyethylene glycol 17 g packet ?Commonly known as: MIRALAX / GLYCOLAX ?Take 17 g by mouth daily. ?  ?Rybelsus 14 MG Tabs ?Generic drug:  Semaglutide ?Take 14 mg by mouth daily. ?  ?senna-docusate 8.6-50 MG tablet ?Commonly known as: Senokot-S ?Take 2 tablets by mouth at bedtime. ?What changed:  ?when to take this ?reasons to take this ?  ?simethicone 80 MG chewable tablet ?Commonly known as: MYLICON ?Chew 0.5 tablets (40 mg total) by mouth every 6 (six) hours as needed for flatulence (bloating). ?  ?Sure Comfort Insulin Syringe 31G X 5/16" 1 ML Misc ?Generic drug: Insulin Syringe-Needle U-100 ?  ?traMADol 50 MG tablet ?Commonly known as: ULTRAM ?Take 1 tablet (50 mg total) by mouth every 6 (six) hours as needed for severe pain. ?  ? ?  ? ? Follow-up Information   ? ? Lucretia Roers, MD Follow up on 02/01/2022.   ?Specialty: General Surgery ?Why: staple removal ?Contact information: ?[address] ?[place] [address] ?[phone] ? ? ?  ?  ? ?  ?  ? ?  ? ? ?Signed: ?Lucretia Roers ?01/21/2022, 12:46 PM ? ? ?

## 2022-01-21 NOTE — Congregational Nurse Program (Signed)
[facility] ? ?RN says patient trying to eat lunch but still feels full. Encouraging ambulating. Will see how she does, had 3BMs per the RN last night but only 1 recorded. ? ?Some degree of tightness expected with the hernia repair/ mesh/ binder. ? ?[name], MD ?[name] ?[address] ?[place], [place] [address] ?[phone] (office) ? ?

## 2022-01-21 NOTE — Progress Notes (Signed)
[facility] ? ?KUB with ileus, EKG and troponin reassuring. ? ?Will see how patient does with lunch and if has more BM.  ? ?Curlene Labrum, MD ?[facility] ?[place], [place] [address] ?[phone] (office) ? ?

## 2022-01-21 NOTE — Progress Notes (Signed)
[facility] ? ?Patient still with complaints of chest pain, but I think it?s probably indigestion and gas given the ileus on KUB. EKG and troponins negative. ? ?Having bowel movements. Ambulating. ? ?Plan for home tomorrow. Dr. Lovell Sheehan will see  and discharge. ? ?[name], MD

## 2022-01-21 NOTE — Discharge Instructions (Signed)
Discharge Open Abdominal Surgery Instructions: ? ?Common Complaints: ?Pain at the incision site is common. This will improve with time. Take your pain medications as described below. ?Some nausea is common and poor appetite. The main goal is to stay hydrated the first few days after surgery.  ? ?Diet/ Activity: ?Diet as tolerated. You have started and tolerated a diet in the hospital, and should continue to increase what you are able to eat.   ?You may not have a large appetite, but it is important to stay hydrated. Drink 64 ounces of water a day. Your appetite will return with time.  ?Keep a dry dressing in place over your staples daily or as needed. Some minor pink/ blood tinged drainage is expected. This will stop in a few days after surgery.  ?Shower per your regular routine daily.  Do not take hot showers. Take warm showers that are less than 10 minutes. Path the incision dry. ?Wear an abdominal binder daily with activity. You do not have to wear this while sleeping or sitting.  ?Rest and listen to your body, but do not remain in bed all day.  ?Walk everyday for at least 15-20 minutes. Deep cough and move around every 1-2 hours in the first few days after surgery.  ?Do not lift > 10 lbs, perform excessive bending, pushing, pulling, squatting for 6-8 weeks after surgery.  ?The activity restrictions and the abdominal binder are to prevent hernia formation at your incision while you are healing.  ?Do not place lotions or balms on your incision unless instructed to specifically by Dr. Henreitta Leber.  ? ?Pain Expectations and Narcotics: ?-After surgery you will have pain associated with your incisions and this is normal. The pain is muscular and nerve pain, and will get better with time. ?-You are encouraged and expected to take non narcotic medications like tylenol and ibuprofen (when able) to treat pain as multiple modalities can aid with pain treatment. ?-Narcotics are only used when pain is severe or there is  breakthrough pain. ?-You are not expected to have a pain score of 0 after surgery, as we cannot prevent pain. A pain score of 3-4 that allows you to be functional, move, walk, and tolerate some activity is the goal. The pain will continue to improve over the days after surgery and is dependent on your surgery. ?-Due to  law, we are only able to give a certain amount of pain medication to treat post operative pain, and we only give additional narcotics on a patient by patient basis.  ?-For most laparoscopic surgery, studies have shown that the majority of patients only need 10-15 narcotic pills, and for open surgeries most patients only need 15-20.   ?-Having appropriate expectations of pain and knowledge of pain management with non narcotics is important as we do not want anyone to become addicted to narcotic pain medication.  ?-Using ice packs in the first 48 hours and heating pads after 48 hours, wearing an abdominal binder (when recommended), and using over the counter medications are all ways to help with pain management.   ?-Simple acts like meditation and mindfulness practices after surgery can also help with pain control and research has proven the benefit of these practices. ? ?Medication: ?Take tylenol and ibuprofen as needed for pain control, alternating every 4-6 hours.  ?Example:  ?Tylenol 1000mg  @ 6am, 12noon, 6pm, (Do not exceed 4000mg  of tylenol a day). Ibuprofen 800mg  @ 9am, 3pm, 9pm, 3am (Do not exceed 3600mg  of ibuprofen a day).  ?  Take Tramadol for breakthrough pain every 4 -6 hours.  ?With your constipation, you may need to try milk of magnesia instead of miralax to go. Also try to take in some fiber.  ?Drink plenty of water to also prevent constipation.  ? ?Contact Information: ?If you have questions or concerns, please call our office, [phone], Monday- Thursday 8AM-5PM and Friday 8AM-12Noon.  ?If it is after hours or on the weekend, please call [facility] Main Number, [phone],  [phone], and ask to speak to the surgeon on call for Dr. Henreitta Leber at [facility].  ? ?

## 2022-01-21 NOTE — Progress Notes (Signed)
Patient had episode of emesis, bile colored, no c/o nausea before vomiting. Patient was taking evening meds at the time and stated "I gagged" as reasoning to vomiting episode. ?

## 2022-01-21 NOTE — Progress Notes (Signed)
[facility] ? ?Patient complaining of chest pain and feeling full. She did not eat this AM. ? ?Ordered EKG and troponin and KUB.  ? ?Says she did not like the roxicodone, made her sick. Says she feels nervous.  ? ?Curlene Labrum, MD ?[facility] ?[place], [place] [address] ?[phone] (office) ? ?

## 2022-01-22 LAB — CBC WITH DIFFERENTIAL/PLATELET
Abs Immature Granulocytes: 0.11 10*3/uL — ABNORMAL HIGH (ref 0.00–0.07)
Basophils Absolute: 0 10*3/uL (ref 0.0–0.1)
Basophils Relative: 0 %
Eosinophils Absolute: 0.3 10*3/uL (ref 0.0–0.5)
Eosinophils Relative: 2 %
HCT: 36.6 % (ref 36.0–46.0)
Hemoglobin: 11.8 g/dL — ABNORMAL LOW (ref 12.0–15.0)
Immature Granulocytes: 1 %
Lymphocytes Relative: 10 %
Lymphs Abs: 1.5 10*3/uL (ref 0.7–4.0)
MCH: 29.6 pg (ref 26.0–34.0)
MCHC: 32.2 g/dL (ref 30.0–36.0)
MCV: 92 fL (ref 80.0–100.0)
Monocytes Absolute: 0.9 10*3/uL (ref 0.1–1.0)
Monocytes Relative: 6 %
Neutro Abs: 11.8 10*3/uL — ABNORMAL HIGH (ref 1.7–7.7)
Neutrophils Relative %: 81 %
Platelets: 292 10*3/uL (ref 150–400)
RBC: 3.98 MIL/uL (ref 3.87–5.11)
RDW: 14.1 % (ref 11.5–15.5)
WBC: 14.7 10*3/uL — ABNORMAL HIGH (ref 4.0–10.5)
nRBC: 0 % (ref 0.0–0.2)

## 2022-01-22 LAB — BASIC METABOLIC PANEL
Anion gap: 10 (ref 5–15)
BUN: 20 mg/dL (ref 8–23)
CO2: 28 mmol/L (ref 22–32)
Calcium: 9.4 mg/dL (ref 8.9–10.3)
Chloride: 98 mmol/L (ref 98–111)
Creatinine, Ser: 0.94 mg/dL (ref 0.44–1.00)
GFR, Estimated: >60 mL/min (ref >60)
Glucose, Bld: 189 mg/dL — ABNORMAL HIGH (ref 70–99)
Potassium: 3.8 mmol/L (ref 3.5–5.1)
Sodium: 136 mmol/L (ref 135–145)

## 2022-01-22 LAB — GLUCOSE, CAPILLARY
Glucose-Capillary: 161 mg/dL — ABNORMAL HIGH (ref 70–99)
Glucose-Capillary: 150 mg/dL — ABNORMAL HIGH (ref 70–99)
Glucose-Capillary: 111 mg/dL — ABNORMAL HIGH (ref 70–99)
Glucose-Capillary: 129 mg/dL — ABNORMAL HIGH (ref 70–99)

## 2022-01-22 MED ORDER — PROCHLORPERAZINE MALEATE 5 MG PO TABS
10.0000 mg | ORAL_TABLET | Freq: Four times a day (QID) | ORAL | Status: DC | PRN
  Filled 2022-01-22: qty 2

## 2022-01-22 MED ORDER — POLYETHYLENE GLYCOL 3350 17 G PO PACK
17.0000 g | PACK | Freq: Two times a day (BID) | ORAL | Status: DC
  Administered 2022-01-22 (×2): 17 g via ORAL
  Filled 2022-01-22 (×3): qty 1

## 2022-01-22 MED ORDER — PROCHLORPERAZINE EDISYLATE 10 MG/2ML IJ SOLN
10.0000 mg | Freq: Four times a day (QID) | INTRAMUSCULAR | Status: DC | PRN
  Administered 2022-01-22 – 2022-01-23 (×4): 10 mg via INTRAVENOUS
  Filled 2022-01-22 (×4): qty 2

## 2022-01-22 NOTE — Progress Notes (Signed)
4 Days Post-Op  ?Subjective: ?Patient had several episodes of emesis.  She is still passing flatus.  She had 3 bowel movements yesterday.  She denies any significant incisional pain. ? ?Objective: ?Vital signs in last 24 hours: ?Temp:  [98.6 ?F (37 ?C)-99.3 ?F (37.4 ?C)] 99.3 ?F (37.4 ?C) (04/10 0532) ?Pulse Rate:  [80-90] 85 (04/10 0532) ?Resp:  [16-20] 18 (04/10 0532) ?BP: (108-163)/(63-81) 131/67 (04/10 0532) ?SpO2:  [90 %-97 %] 96 % (04/10 0532) ?Last BM Date : 01/20/22 ? ?Intake/Output from previous day: ?04/09 0701 - 04/10 0700 ?In: 1575.9 [P.O.:480; I.V.:1095.9] ?Out: -  ?Intake/Output this shift: ?No intake/output data recorded. ? ?General appearance: alert, cooperative, and no distress ?Resp: clear to auscultation bilaterally ?Cardio: regular rate and rhythm, S1, S2 normal, no murmur, click, rub or gallop ?GI: Soft, slightly distended.  Bowel sounds present.  Incision healing well. ? ?Lab Results:  ?Recent Labs  ?  01/21/22 ?0403 01/22/22 ?0454  ?WBC 13.3* 14.7*  ?HGB 12.2 11.8*  ?HCT 38.6 36.6  ?PLT 255 292  ? ?BMET ?Recent Labs  ?  01/20/22 ?0425 01/22/22 ?0454  ?NA 136 136  ?K 3.9 3.8  ?CL 99 98  ?CO2 28 28  ?GLUCOSE 183* 189*  ?BUN 13 20  ?CREATININE 0.93 0.94  ?CALCIUM 8.8* 9.4  ? ?PT/INR ?No results for input(s): LABPROT, INR in the last 72 hours. ? ?Studies/Results: ?DG Abd 1 View ? ?Result Date: 01/21/2022 ?CLINICAL DATA:  Ileus EXAM: ABDOMEN - 1 VIEW COMPARISON:  None. FINDINGS: Status post low midline laparotomy. Gas-filled, mildly distended loops of small bowel in the mid abdomen, measuring up to 4.5 cm in caliber. Gas and scattered stool present to the rectum. No free air on supine radiographs. IMPRESSION: Gas-filled, mildly distended loops of small bowel in the mid abdomen, measuring up to 4.5 cm in caliber. Findings most consistent with postoperative ileus. Electronically Signed   By: Jearld Lesch M.D.   On: 01/21/2022 10:02   ? ?Anti-infectives: ?Anti-infectives (From admission, onward)  ? ?  Start     Dose/Rate Route Frequency Ordered Stop  ? 01/18/22 0700  cefoTEtan (CEFOTAN) 2 g in sodium chloride 0.9 % 100 mL IVPB       ? 2 g ?200 mL/hr over 30 Minutes Intravenous On call to O.R. 01/18/22 7371 01/18/22 0807  ? ?  ? ? ?Assessment/Plan: ?s/p Procedure(s): ?HERNIA REPAIR INCISIONAL W/ MESH ?PARTIAL EXCISION OF PRIOR MESH ?Impression: Postoperative day 4.  Patient is having some bowel dysfunction.  She is passing gas but also having some emesis.  Her white blood cell count is slightly elevated but I do not not see any signs of infection.  Have added MiraLAX to her bowel regimen.  Will monitor CBC.  If it continues to increase, will get CT scan of the abdomen. ? LOS: 1 day  ? ? ?Franky Macho ?01/22/2022  ?

## 2022-01-22 NOTE — Progress Notes (Signed)
Patient called up to desk to tell nurse that she had vomited and needed a new bag, nurse went to patient's room to assess, emesis is still bile in color with few scattered food pieces. Night MD Adefeso notified, new order in chart. ?

## 2022-01-22 NOTE — Plan of Care (Signed)

## 2022-01-22 NOTE — Progress Notes (Signed)
Gave morning medications patient put them in her mouth and then immediately spit them into emesis bag,  ?

## 2022-01-23 ENCOUNTER — Encounter (HOSPITAL_COMMUNITY): Payer: Self-pay | Admitting: General Surgery

## 2022-01-23 LAB — BASIC METABOLIC PANEL
Anion gap: 10 (ref 5–15)
BUN: 18 mg/dL (ref 8–23)
CO2: 28 mmol/L (ref 22–32)
Calcium: 9.1 mg/dL (ref 8.9–10.3)
Chloride: 98 mmol/L (ref 98–111)
Creatinine, Ser: 0.87 mg/dL (ref 0.44–1.00)
GFR, Estimated: >60 mL/min (ref >60)
Glucose, Bld: 177 mg/dL — ABNORMAL HIGH (ref 70–99)
Potassium: 4.1 mmol/L (ref 3.5–5.1)
Sodium: 136 mmol/L (ref 135–145)

## 2022-01-23 LAB — CBC
HCT: 37.9 % (ref 36.0–46.0)
Hemoglobin: 11.9 g/dL — ABNORMAL LOW (ref 12.0–15.0)
MCH: 28.8 pg (ref 26.0–34.0)
MCHC: 31.4 g/dL (ref 30.0–36.0)
MCV: 91.8 fL (ref 80.0–100.0)
Platelets: 311 10*3/uL (ref 150–400)
RBC: 4.13 MIL/uL (ref 3.87–5.11)
RDW: 13.9 % (ref 11.5–15.5)
WBC: 12.5 10*3/uL — ABNORMAL HIGH (ref 4.0–10.5)
nRBC: 0 % (ref 0.0–0.2)

## 2022-01-23 LAB — GLUCOSE, CAPILLARY
Glucose-Capillary: 159 mg/dL — ABNORMAL HIGH (ref 70–99)
Glucose-Capillary: 137 mg/dL — ABNORMAL HIGH (ref 70–99)

## 2022-01-23 NOTE — Care Management Important Message (Signed)
Important Message ? ?Patient Details  ?Name: [name] ?MRN: [id number] ?Date of Birth: 1951/05/05 ? ? ?Medicare Important Message Given:  Yes ? ?Reviewed Medicare IM with patient via room phone [phone]).  Copy of Medicare IM placed in mail to home address on file. ? ? ? ?[name] ?01/23/2022, 9:33 AM ?

## 2022-01-23 NOTE — Progress Notes (Signed)
Patient c/o nausea earlier in shift, was given Zoran @ 0205. Patient vomited moderate amount of bile colored liquid with some partially undigested food pieces @ 0330. Night MD, Adefeso notified.  ?

## 2022-01-23 NOTE — Plan of Care (Signed)

## 2022-01-23 NOTE — Discharge Summary (Signed)
Physician Discharge Summary  ?Patient ID: ?Truddie Hidden ?MRN: [id number] ?DOB/AGE: [birth year] 71 y.o. ? ?Admit date: 01/18/2022 ?Discharge date: 01/23/2022 ? ?Admission Diagnoses: Recurrent incisional hernia ? ?Discharge Diagnoses:  ?Principal Problem: ?  Ventral hernia without obstruction or gangrene ?Active Problems: ?  Ileus (HCC) ?Diabetes mellitus, hypertension, hyperlipidemia ? ?Discharged Condition: good ? ?Hospital Course: Patient is a 71 year old black female with a history of multiple abdominal surgeries who underwent a recurrent incisional herniorrhaphy with mesh by Dr. Henreitta Leber on 01/18/2022.  She tolerated the surgery well.  Her postoperative course was remarkable for a delay in return of her bowel function.  She also had various complaints of indigestion chest pain.  Work-up for this was negative.  She had a mild leukocytosis postoperatively, but today her white blood cell count is 12.5.  She is feeling better.  Her bowel function is normalizing.  She is able to take p.o. well.  Patient is being discharged home on 01/23/2022 in good and improving condition. ? ?Treatments: surgery: Recurrent incisional herniorrhaphy with mesh on 01/18/2022 ? ?Discharge Exam: ?Blood pressure 128/62, pulse 85, temperature 98.1 ?F (36.7 ?C), resp. rate 18, height 5\' 5"  (1.651 m), weight 83.8 kg, SpO2 98 %. ?General appearance: alert, cooperative, and no distress ?Resp: clear to auscultation bilaterally ?Cardio: regular rate and rhythm, S1, S2 normal, no murmur, click, rub or gallop ?GI: Soft, incision healing well without erythema or drainage.  Bowel sounds present. ? ?Disposition: Discharge disposition: 01-Home or Self Care ? ? ? ? ? ? ?Discharge Instructions   ? ? Call MD for:  difficulty breathing, headache or visual disturbances   Complete by: As directed ?  ? Call MD for:  extreme fatigue   Complete by: As directed ?  ? Call MD for:  persistant dizziness or light-headedness   Complete by: As directed ?  ? Call MD for:   persistant nausea and vomiting   Complete by: As directed ?  ? Call MD for:  redness, tenderness, or signs of infection (pain, swelling, redness, odor or green/yellow discharge around incision site)   Complete by: As directed ?  ? Call MD for:  severe uncontrolled pain   Complete by: As directed ?  ? Call MD for:  temperature >100.4   Complete by: As directed ?  ? Diet - low sodium heart healthy   Complete by: As directed ?  ? Increase activity slowly   Complete by: As directed ?  ? Increase activity slowly   Complete by: As directed ?  ? ?  ? ?Allergies as of 01/23/2022   ?No Known Allergies ?  ? ?  ?Medication List  ?  ? ?STOP taking these medications   ? ?ondansetron 4 MG tablet ?Commonly known as: ZOFRAN ?  ? ?  ? ?TAKE these medications   ? ?acetaminophen 325 MG tablet ?Commonly known as: TYLENOL ?Take 2 tablets (650 mg total) by mouth every 6 (six) hours as needed for mild pain (or Fever >/= 101). ?  ?amLODipine 10 MG tablet ?Commonly known as: NORVASC ?Take 10 mg by mouth daily. ?  ?AquaLance Lancets 30G Misc ?  ?aspirin EC 81 MG tablet ?Take 1 tablet (81 mg total) by mouth daily with breakfast. ?  ?atorvastatin 40 MG tablet ?Commonly known as: LIPITOR ?Take 40 mg by mouth daily. ?  ?docusate sodium 100 MG capsule ?Commonly known as: COLACE ?Take 1 capsule (100 mg total) by mouth 2 (two) times daily. ?  ?fluorometholone 0.1 % ophthalmic suspension ?Commonly  known as: FML ?Place 1 drop into both eyes daily as needed (irritation). ?  ?HumuLIN 70/30 (70-30) 100 UNIT/ML injection ?Generic drug: insulin NPH-regular Human ?Inject 45 Units into the skin 2 (two) times daily with a meal. ?  ?hydrochlorothiazide 12.5 MG capsule ?Commonly known as: MICROZIDE ?Take 1 capsule (12.5 mg total) by mouth daily. ?  ?isosorbide mononitrate 30 MG 24 hr tablet ?Commonly known as: IMDUR ?Take 0.5 tablets (15 mg total) by mouth daily. ?  ?losartan 50 MG tablet ?Commonly known as: COZAAR ?Take 50 mg by mouth daily. ?  ?magnesium  hydroxide 400 MG/5ML suspension ?Commonly known as: MILK OF MAGNESIA ?Take 30 mLs by mouth daily as needed for mild constipation or moderate constipation. ?  ?Meloxicam 15 MG Tbdp ?Take 1 tablet by mouth daily with breakfast. ?  ?metFORMIN 1000 MG tablet ?Commonly known as: GLUCOPHAGE ?Take 1,000 mg by mouth at bedtime. ?  ?methocarbamol 500 MG tablet ?Commonly known as: ROBAXIN ?Take 500 mg by mouth every 8 (eight) hours as needed for muscle spasms. ?  ?nitroGLYCERIN 0.4 MG SL tablet ?Commonly known as: NITROSTAT ?Place 1 tablet (0.4 mg total) under the tongue every 5 (five) minutes as needed for chest pain. Dissolve one tablet under the tongue every 5 mintues as needed for chest pain. ?  ?ondansetron 4 MG disintegrating tablet ?Commonly known as: ZOFRAN-ODT ?Take 1 tablet (4 mg total) by mouth every 6 (six) hours as needed for nausea. ?  ?pantoprazole 40 MG tablet ?Commonly known as: Protonix ?Take 1 tablet (40 mg total) by mouth daily. ?  ?polyethylene glycol 17 g packet ?Commonly known as: MIRALAX / GLYCOLAX ?Take 17 g by mouth daily. ?  ?Rybelsus 14 MG Tabs ?Generic drug: Semaglutide ?Take 14 mg by mouth daily. ?  ?senna-docusate 8.6-50 MG tablet ?Commonly known as: Senokot-S ?Take 2 tablets by mouth at bedtime. ?What changed:  ?when to take this ?reasons to take this ?  ?simethicone 80 MG chewable tablet ?Commonly known as: MYLICON ?Chew 0.5 tablets (40 mg total) by mouth every 6 (six) hours as needed for flatulence (bloating). ?  ?Sure Comfort Insulin Syringe 31G X 5/16" 1 ML Misc ?Generic drug: Insulin Syringe-Needle U-100 ?  ?traMADol 50 MG tablet ?Commonly known as: ULTRAM ?Take 1 tablet (50 mg total) by mouth every 6 (six) hours as needed for severe pain. ?  ? ?  ? ? Follow-up Information   ? ? Lucretia Roers, MD Follow up on 02/01/2022.   ?Specialty: General Surgery ?Why: staple removal ?Contact information: ?[address] ?[place] [address] ?[phone] ? ? ?  ?  ? ?  ?  ? ?   ? ? ?Signed: ?Franky Macho ?01/23/2022, 8:44 AM ? ? ?

## 2022-01-23 NOTE — Progress Notes (Signed)
Nsg Discharge Note ? ?Admit Date:  01/18/2022 ?Discharge date: 01/23/2022 ?  ?Truddie Hidden to be D/C'd Home per MD order.  AVS completed.  Copy for chart, and copy for patient signed, and dated. ?Patient/caregiver able to verbalize understanding. ? ?Discharge Medication: ?Allergies as of 01/23/2022   ?No Known Allergies ?  ? ?  ?Medication List  ?  ? ?STOP taking these medications   ? ?ondansetron 4 MG tablet ?Commonly known as: ZOFRAN ?  ? ?  ? ?TAKE these medications   ? ?acetaminophen 325 MG tablet ?Commonly known as: TYLENOL ?Take 2 tablets (650 mg total) by mouth every 6 (six) hours as needed for mild pain (or Fever >/= 101). ?  ?amLODipine 10 MG tablet ?Commonly known as: NORVASC ?Take 10 mg by mouth daily. ?  ?AquaLance Lancets 30G Misc ?  ?aspirin EC 81 MG tablet ?Take 1 tablet (81 mg total) by mouth daily with breakfast. ?  ?atorvastatin 40 MG tablet ?Commonly known as: LIPITOR ?Take 40 mg by mouth daily. ?  ?docusate sodium 100 MG capsule ?Commonly known as: COLACE ?Take 1 capsule (100 mg total) by mouth 2 (two) times daily. ?  ?fluorometholone 0.1 % ophthalmic suspension ?Commonly known as: FML ?Place 1 drop into both eyes daily as needed (irritation). ?  ?HumuLIN 70/30 (70-30) 100 UNIT/ML injection ?Generic drug: insulin NPH-regular Human ?Inject 45 Units into the skin 2 (two) times daily with a meal. ?  ?hydrochlorothiazide 12.5 MG capsule ?Commonly known as: MICROZIDE ?Take 1 capsule (12.5 mg total) by mouth daily. ?  ?isosorbide mononitrate 30 MG 24 hr tablet ?Commonly known as: IMDUR ?Take 0.5 tablets (15 mg total) by mouth daily. ?  ?losartan 50 MG tablet ?Commonly known as: COZAAR ?Take 50 mg by mouth daily. ?  ?magnesium hydroxide 400 MG/5ML suspension ?Commonly known as: MILK OF MAGNESIA ?Take 30 mLs by mouth daily as needed for mild constipation or moderate constipation. ?  ?Meloxicam 15 MG Tbdp ?Take 1 tablet by mouth daily with breakfast. ?  ?metFORMIN 1000 MG tablet ?Commonly known as:  GLUCOPHAGE ?Take 1,000 mg by mouth at bedtime. ?  ?methocarbamol 500 MG tablet ?Commonly known as: ROBAXIN ?Take 500 mg by mouth every 8 (eight) hours as needed for muscle spasms. ?  ?nitroGLYCERIN 0.4 MG SL tablet ?Commonly known as: NITROSTAT ?Place 1 tablet (0.4 mg total) under the tongue every 5 (five) minutes as needed for chest pain. Dissolve one tablet under the tongue every 5 mintues as needed for chest pain. ?  ?ondansetron 4 MG disintegrating tablet ?Commonly known as: ZOFRAN-ODT ?Take 1 tablet (4 mg total) by mouth every 6 (six) hours as needed for nausea. ?  ?pantoprazole 40 MG tablet ?Commonly known as: Protonix ?Take 1 tablet (40 mg total) by mouth daily. ?  ?polyethylene glycol 17 g packet ?Commonly known as: MIRALAX / GLYCOLAX ?Take 17 g by mouth daily. ?  ?Rybelsus 14 MG Tabs ?Generic drug: Semaglutide ?Take 14 mg by mouth daily. ?  ?senna-docusate 8.6-50 MG tablet ?Commonly known as: Senokot-S ?Take 2 tablets by mouth at bedtime. ?What changed:  ?when to take this ?reasons to take this ?  ?simethicone 80 MG chewable tablet ?Commonly known as: MYLICON ?Chew 0.5 tablets (40 mg total) by mouth every 6 (six) hours as needed for flatulence (bloating). ?  ?Sure Comfort Insulin Syringe 31G X 5/16" 1 ML Misc ?Generic drug: Insulin Syringe-Needle U-100 ?  ?traMADol 50 MG tablet ?Commonly known as: ULTRAM ?Take 1 tablet (50 mg total) by  mouth every 6 (six) hours as needed for severe pain. ?  ? ?  ? ? ?Discharge Assessment: ?Vitals:  ? 01/22/22 2030 01/23/22 0505  ?BP: (!) 146/64 128/62  ?Pulse: 87 85  ?Resp: 18 18  ?Temp: 98.1 ?F (36.7 ?C) 98.1 ?F (36.7 ?C)  ?SpO2: 92% 98%  ? Skin clean, dry and intact without evidence of skin break down, no evidence of skin tears noted. ?IV catheter discontinued intact. Site without signs and symptoms of complications - no redness or edema noted at insertion site, patient denies c/o pain - only slight tenderness at site.  Dressing with slight pressure applied. ? ?D/c  Instructions-Education: ?Discharge instructions given to patient/family with verbalized understanding. ?D/c education completed with patient/family including follow up instructions, medication list, d/c activities limitations if indicated, with other d/c instructions as indicated by MD - patient able to verbalize understanding, all questions fully answered. ?Patient instructed to return to ED, call 911, or call MD for any changes in condition.  ?Patient escorted via WC, and D/C home via private auto. ? ?Andria Rhein, RN ?01/23/2022 12:11 PM  ?

## 2022-02-01 ENCOUNTER — Ambulatory Visit (INDEPENDENT_AMBULATORY_CARE_PROVIDER_SITE_OTHER): Payer: Medicare Other | Admitting: General Surgery

## 2022-02-01 ENCOUNTER — Other Ambulatory Visit: Payer: Self-pay

## 2022-02-01 ENCOUNTER — Encounter: Payer: Self-pay | Admitting: General Surgery

## 2022-02-01 VITALS — BP 114/72 | HR 78 | Temp 98.2°F | Resp 14 | Ht 65.0 in | Wt 168.0 lb

## 2022-02-01 DIAGNOSIS — K432 Incisional hernia without obstruction or gangrene: Secondary | ICD-10-CM

## 2022-02-01 MED ORDER — DICYCLOMINE HCL 10 MG PO CAPS: 10.0000 mg | ORAL_CAPSULE | Freq: Three times a day (TID) | ORAL | 1 refills | Status: AC

## 2022-02-01 MED ORDER — ONDANSETRON 4 MG PO TBDP: 4.0000 mg | ORAL_TABLET | Freq: Four times a day (QID) | ORAL | 0 refills | Status: DC | PRN

## 2022-02-01 NOTE — Progress Notes (Signed)
[facility] ? ?Doing fair and having diarrhea. Says she has cramping pain.  ? ?BP 114/72   Pulse 78   Temp 98.2 ?F (36.8 ?C) (Oral)   Resp 14   Ht 5\' 5"  (1.651 m)   Wt 168 lb (76.2 kg)   SpO2 93%   BMI 27.96 kg/m?  ?Incisions c/d/I with staples, no erythema or drainage ?Half staples removed ?Indurated on the right side but no signs of infection ? ? ?Patient s/p Open hernia repair with mesh. Doing ok but having cramping abdominal pain. No signs of infection. ?Diet as tolerated. ?Take nausea medication, zofran as needed. ?Take bentyl with meals and at night. ?No heavy lifting > 10 lbs, excessive bending, pushing, pulling, or squatting for 6-8 weeks after surgery.  ? ?Future Appointments  ?Date Time Provider Department Center  ?02/05/2022 10:45 AM 02/07/2022, MD [facility] None  ?02/14/2022  1:00 PM 04/16/2022, MD [facility] [place] [facility]  ?03/02/2022 10:30 AM 03/04/2022, PA-C [facility]  ?05/31/2022  9:30 AM 06/02/2022, MD [facility] None  ? ?Kerrin Champagne, MD ?[facility] ?[address] ?[place], [place] ?[phone] (office) ? ?

## 2022-02-01 NOTE — Patient Instructions (Addendum)
Diet as tolerated. ?Take nausea medication, zofran as needed. ?Take bentyl with meals and at night. ?No heavy lifting > 10 lbs, excessive bending, pushing, pulling, or squatting for 6-8 weeks after surgery.  ? ?

## 2022-02-05 ENCOUNTER — Ambulatory Visit (INDEPENDENT_AMBULATORY_CARE_PROVIDER_SITE_OTHER): Payer: Medicare Other | Admitting: General Surgery

## 2022-02-05 ENCOUNTER — Encounter: Payer: Self-pay | Admitting: General Surgery

## 2022-02-05 VITALS — BP 116/72 | HR 70 | Temp 98.1°F | Resp 14 | Ht 65.0 in | Wt 168.0 lb

## 2022-02-05 DIAGNOSIS — K432 Incisional hernia without obstruction or gangrene: Secondary | ICD-10-CM

## 2022-02-05 MED ORDER — AMOXICILLIN-POT CLAVULANATE 875-125 MG PO TABS: 1.0000 | ORAL_TABLET | Freq: Two times a day (BID) | ORAL | 0 refills | Status: AC

## 2022-02-05 NOTE — Patient Instructions (Signed)
Keep eye on the redness around the incision. I think this is irritation but if worsening let us know.  ?Will prescribe an antibiotic to be safe to ensure nothing is getting infected given the use of the mesh.  ?

## 2022-02-05 NOTE — Progress Notes (Signed)
[facility] ? ?Incision with some irritation on it. Having Bms and eating. Says she is feeling better. ?No drainage. No fevers ? ?BP 116/72   Pulse 70   Temp 98.1 ?F (36.7 ?C) (Oral)   Resp 14   Ht 5\' 5"  (1.651 m)   Wt 168 lb (76.2 kg)   SpO2 96%   BMI 27.96 kg/m?  ?Remaining staples removed, mild induration, some? Erythema versus irritation from her pants ?Marked out area ? ?Patient s/p incisional hernia repair with mesh. ? ?Keep eye on the redness around the incision. I think this is irritation but if worsening let know.  ?Will prescribe an antibiotic to be safe to ensure nothing is getting infected given the use of the mesh.  ? ?Future Appointments  ?Date Time Provider Department Center  ?02/14/2022  1:00 PM 04/16/2022, MD [facility]  ?02/15/2022  9:15 AM 04/17/2022, MD [facility] None  ?02/15/2022  9:45 AM 04/17/2022 Henreitta Leber, MD [facility] None  ?03/02/2022 10:30 AM 03/04/2022, PA-C [facility]  ?05/31/2022  9:30 AM 06/02/2022, MD [facility] None  ? ? ?Kerrin Champagne, MD ?[facility] ?[address] ?[place], [place] ?[phone] (office) ?  ?

## 2022-02-11 NOTE — Progress Notes (Deleted)
Cardiology Office Note   Date:  02/11/2022   ID:  Angela HiddenMargaret L Boxley, DOB 1951/10/04, MRN [id number]  PCP:  Mirna MiresHill, Gerald, MD  Cardiologist:  Was Dr. Lonn GeorgiaKO     No chief complaint on file.      History of Present Illness: [name] is a 71 y.o. female who presents for follow up of hx chest pain.   History of mild carotid artery disease (followed by [facility]), HTN, HLD (followed in primary care), DM, asthma, migraines, varicose veins with mild chronic edema, arthritis who presents for follow-up.    She has history of coronary CTA in 05/2019 showing no significant CAD or incidental findings, small secundum ASD noted. She recently saw GI and reported inspirational chest pain so CTA was ordered 02/11/20 and was negative for PE or acute abnormalities. She was recommended to take ibuprofen. She was subsequently admitted 02/22/20 with worsening SOB and continued intermittent chest pain, more with exertion and recumbency. hsTroponins were normal, BNP normal, and Covid was negative. LE venous duplex showed no DVT. There was no obvious cardiac etiology for her chest pain. She had an echo performed 02/23/20 to evaluate for the reported ASD on study last year. This showed EF 60-65%, mild LVH, normal RV, no significant valvular disease, bubble study positive within 3-6 cardiac cycles suggestive of intraatrial shunt, right to left shunt is small at rest, moderate following the Valsalva maneuver. This was not felt significantly different than prior study.    Since last clinic visit, she was admitted earlier this month for recurrent incisional hernia repair. reports intermittent chest pain, occur about once per week and lasting for several minutes.  Occurs at rest.  Feels sharp pain across whole chest.  Pain resolved with nitroglycerin.  No change in pain over the last several years.  She denies any dyspnea, lightheadedness, syncope, lower extreme edema, or palpitations.  She walks daily for 2 hours.   Denies chest pain while walking.  Past Medical History:  Diagnosis Date   Asthma    Bronchitis    Diabetes mellitus    Hyperlipidemia    Hypertension    Interatrial cardiac shunt    a. small secundum ASD vs pulmonary shunt.   Low back pain    Migraine    Mild carotid artery disease (HCC)    Osteoarthritis    Sciatica    Varicose veins    Vertigo     Past Surgical History:  Procedure Laterality Date   ABDOMINAL HYSTERECTOMY     BIOPSY  05/12/2019   Procedure: BIOPSY;  Surgeon: West BaliFields, Sandi L, MD;  Location: [facility];  Service: Endoscopy;;   CARPAL TUNNEL RELEASE Bilateral    CESAREAN SECTION     ESOPHAGOGASTRODUODENOSCOPY (EGD) WITH PROPOFOL N/A 05/12/2019   Procedure: ESOPHAGOGASTRODUODENOSCOPY (EGD) WITH PROPOFOL;  Surgeon: West BaliFields, Sandi L, MD;  normal-appearing esophagus s/p empiric dilation, moderate gastritis due to ASA s/p biopsy, normal examined duodenum.  Biopsies with gastritis.    EXCISION OF MESH N/A 01/18/2022   Procedure: PARTIAL EXCISION OF PRIOR MESH;  Surgeon: Lucretia RoersBridges, Lindsay C, MD;  Location: [facility] ORS;  Service: General;  Laterality: N/A;   INCISIONAL HERNIA REPAIR N/A 03/15/2021   Procedure: Linus MakoINCISIONAL HERNIORRHAPHY WITH MESH;  Surgeon: Lucretia RoersBridges, Lindsay C, MD;  Location: [facility];  Service: General;  Laterality: N/A;   INCISIONAL HERNIA REPAIR N/A 01/18/2022   Procedure: HERNIA REPAIR INCISIONAL W/ MESH;  Surgeon: Lucretia RoersBridges, Lindsay C, MD;  Location: [facility];  Service: General;  Laterality: N/A;  INGUINAL HERNIA REPAIR Left    LAPAROTOMY N/A 03/15/2021   Procedure: EXPLORATORY LAPAROTOMY;  Surgeon: Lucretia Roers, MD;  Location: [facility];  Service: General;  Laterality: N/A;   LUMBAR FUSION     LYSIS OF ADHESION N/A 03/15/2021   Procedure: LYSIS OF ADHESION;  Surgeon: Lucretia Roers, MD;  Location: [facility];  Service: General;  Laterality: N/A;   POSTERIOR LUMBAR FUSION 4 LEVEL N/A 03/04/2018   Procedure: Right transforaminal lumbar interbody fusion L1-2, L2-3  Posterior fusion T10, T11, T12, L1, L2 with T 11, T12 pedicle screws, superior sublaminar hooks T10, local bone graft, allograft cancellous chips Vivigen;  Surgeon: Kerrin Champagne, MD;  Location: [facility] OR;  Service: Orthopedics;  Laterality: N/A;   SAVORY DILATION N/A 05/12/2019   Procedure: SAVORY DILATION;  Surgeon: West Bali, MD;  Location: [facility];  Service: Endoscopy;  Laterality: N/A;     Current Outpatient Medications  Medication Sig Dispense Refill   acetaminophen (TYLENOL) 325 MG tablet Take 2 tablets (650 mg total) by mouth every 6 (six) hours as needed for mild pain (or Fever >/= 101). (Patient not taking: Reported on 01/11/2022) 30 tablet 2   amLODipine (NORVASC) 10 MG tablet Take 10 mg by mouth daily.     amoxicillin-clavulanate (AUGMENTIN) 875-125 MG tablet Take 1 tablet by mouth 2 (two) times daily for 10 days. 20 tablet 0   AquaLance Lancets 30G MISC      aspirin EC 81 MG tablet Take 1 tablet (81 mg total) by mouth daily with breakfast. 30 tablet 5   atorvastatin (LIPITOR) 40 MG tablet Take 40 mg by mouth daily.     dicyclomine (BENTYL) 10 MG capsule Take 1 capsule (10 mg total) by mouth 4 (four) times daily -  before meals and at bedtime. 30 capsule 1   docusate sodium (COLACE) 100 MG capsule Take 1 capsule (100 mg total) by mouth 2 (two) times daily. 60 capsule 0   fluorometholone (FML) 0.1 % ophthalmic suspension Place 1 drop into both eyes daily as needed (irritation).     HUMULIN 70/30 (70-30) 100 UNIT/ML injection Inject 45 Units into the skin 2 (two) times daily with a meal. 10 mL 11   hydrochlorothiazide (MICROZIDE) 12.5 MG capsule Take 1 capsule (12.5 mg total) by mouth daily. 30 capsule 0   isosorbide mononitrate (IMDUR) 30 MG 24 hr tablet Take 0.5 tablets (15 mg total) by mouth daily. 45 tablet 3   losartan (COZAAR) 50 MG tablet Take 50 mg by mouth daily.  4   magnesium hydroxide (MILK OF MAGNESIA) 400 MG/5ML suspension Take 30 mLs by mouth daily as needed for  mild constipation or moderate constipation. 355 mL 0   Meloxicam 15 MG TBDP Take 1 tablet by mouth daily with breakfast. 90 tablet 4   metFORMIN (GLUCOPHAGE) 1000 MG tablet Take 1,000 mg by mouth at bedtime.     methocarbamol (ROBAXIN) 500 MG tablet Take 500 mg by mouth every 8 (eight) hours as needed for muscle spasms.     nitroGLYCERIN (NITROSTAT) 0.4 MG SL tablet Place 1 tablet (0.4 mg total) under the tongue every 5 (five) minutes as needed for chest pain. Dissolve one tablet under the tongue every 5 mintues as needed for chest pain. 25 tablet 2   ondansetron (ZOFRAN-ODT) 4 MG disintegrating tablet Take 1 tablet (4 mg total) by mouth every 6 (six) hours as needed for nausea. 20 tablet 0   pantoprazole (PROTONIX) 40 MG tablet Take 1  tablet (40 mg total) by mouth daily. (Patient not taking: Reported on 01/11/2022) 30 tablet 1   polyethylene glycol (MIRALAX / GLYCOLAX) 17 g packet Take 17 g by mouth daily. 30 each 1   RYBELSUS 14 MG TABS Take 14 mg by mouth daily.     senna-docusate (SENOKOT-S) 8.6-50 MG tablet Take 2 tablets by mouth at bedtime. (Patient taking differently: Take 2 tablets by mouth at bedtime as needed for moderate constipation.) 60 tablet 11   simethicone (MYLICON) 80 MG chewable tablet Chew 0.5 tablets (40 mg total) by mouth every 6 (six) hours as needed for flatulence (bloating). 30 tablet 0   SURE COMFORT INSULIN SYRINGE 31G X 5/16" 1 ML MISC      traMADol (ULTRAM) 50 MG tablet Take 1 tablet (50 mg total) by mouth every 6 (six) hours as needed for severe pain. 30 tablet 0   No current facility-administered medications for this visit.    Allergies:   Patient has no known allergies.    Social History:  The patient  reports that she has never smoked. She has never used smokeless tobacco. She reports that she does not drink alcohol and does not use drugs.   Family History:  The patient's family history includes Breast cancer in her mother; Diabetes in her sister and sister;  Heart failure in her sister and sister; Lung cancer in her mother; Stroke in her father.    ROS:  General:no colds or fevers, no weight changes Skin:no rashes or ulcers HEENT:no blurred vision, no congestion CV:see HPI PUL:see HPI GI:no diarrhea constipation or melena, no indigestion GU:no hematuria, + dysuria and difficulty passing urine. MS:no joint pain, no claudication Neuro:no syncope, no lightheadedness Endo:+ diabetes, no thyroid disease  Wt Readings from Last 3 Encounters:  02/05/22 168 lb (76.2 kg)  02/01/22 168 lb (76.2 kg)  01/18/22 184 lb 11.9 oz (83.8 kg)     PHYSICAL EXAM: VS:  There were no vitals taken for this visit. , BMI There is no height or weight on file to calculate BMI. General:Pleasant affect, NAD Skin:Warm and dry, brisk capillary refill HEENT:normocephalic, sclera clear, mucus membranes moist Neck:supple, no JVD, + b/l bruits Heart:S1S2 RRR, 2/6 systolic murmur Lungs:clear without rales, rhonchi, or wheezes FAO:ZHYQ, non tender, + BS, do not palpate liver spleen or masses Ext:no lower ext edema, 2+ pedal pulses, 2+ radial pulses Neuro:alert and oriented X 3, MAE, follows commands, + facial symmetry    EKG:  EKG today shows sinus rhythm, rate 80, no ST abnormalities    Recent Labs: 02/14/2021: B Natriuretic Peptide 18.0 03/14/2021: TSH 1.530 03/18/2021: ALT 32 01/20/2022: Magnesium 1.9 01/23/2022: BUN 18; Creatinine, Ser 0.87; Hemoglobin 11.9; Platelets 311; Potassium 4.1; Sodium 136    Lipid Panel    Component Value Date/Time   CHOL 92 03/14/2021 0623   TRIG 170 (H) 03/14/2021 0623   HDL 20 (L) 03/14/2021 0623   CHOLHDL 4.6 03/14/2021 0623   VLDL 34 03/14/2021 0623   LDLCALC 38 03/14/2021 6578       Other studies Reviewed: Additional studies/ records that were reviewed today include:  . 2D Echo 02/2020 1. Left ventricular ejection fraction, by estimation, is 60 to 65%. The  left ventricle has normal function. The left ventricle has no  regional  wall motion abnormalities. There is mild concentric left ventricular  hypertrophy. Left ventricular diastolic  parameters were normal.   2. Right ventricular systolic function is normal. The right ventricular  size is normal. Tricuspid regurgitation signal  is inadequate for assessing  PA pressure.   3. The mitral valve is normal in structure. No evidence of mitral valve  regurgitation. No evidence of mitral stenosis.   4. The aortic valve is normal in structure. Aortic valve regurgitation is  not visualized. No aortic stenosis is present.   5. The inferior vena cava is normal in size with greater than 50%  respiratory variability, suggesting right atrial pressure of 3 mmHg.   6. Agitated saline contrast bubble study was positive with shunting  observed within 3-6 cardiac cycles suggestive of interatrial shunt. The  right to left shunt is small at rest, moderate following the Valsalva  maneuver.   Comparison(s): No significant change from prior study.   Cor CT 05/2019  ADDENDUM REPORT: 06/11/2019 11:36   CLINICAL DATA:  49F with dyspnea and atypical chest pain.   EXAM: Cardiac/Coronary  CT   TECHNIQUE: The patient was scanned on a [employer].   FINDINGS: A 120 kV prospective scan was triggered in the descending thoracic aorta at 111 HU's. Axial non-contrast 3 mm slices were carried out through the heart. The data set was analyzed on a dedicated work station and scored using the Agatson method. Gantry rotation speed was 250 msecs and collimation was .6 mm. No beta blockade and 0.8 mg of sl NTG was given. The 3D data set was reconstructed in 5% intervals of the 67-82 % of the R-R cycle. Diastolic phases were analyzed on a dedicated work station using MPR, MIP and VRT modes. The patient received 80 cc of contrast.   Aorta: Normal size. Ascending aorta 3.3 cm. No calcifications. No dissection.   Aortic Valve: Trileaflet. Minimal calcification of the  aortic annulus.   Coronary Arteries:  Normal coronary origin.  Right dominance.   RCA is a dominant artery that gives rise to PDA and PLVB. There is minimal soft attenuation plaque distally.   Left main is a large artery that gives rise to LAD, LCX, and RI arteries.   LAD is a large vessel that has no plaque. There is a small D1 without plaque.   LCX is a small, non-dominant artery.   There is no plaque.   Other findings:   Normal pulmonary vein drainage into the left atrium.   Normal let atrial appendage without a thrombus.   Small secundum ASD noted.  7.7mm x 3.2 mm.   Normal size of the pulmonary artery.   IMPRESSION: 1. Coronary calcium score of 0. This was 0 percentile for age and sex matched control.   2. Normal coronary origin with right dominance.   3. No evidence of CAD.   4.  Small secundum ASD noted.   Chilton Si, MD       ASSESSMENT AND PLAN:  Chest pain: Her cardiac CTA in 2020 was normal, no CAD.  She continues to have intermittent chest pain, unchanged from when she had her CTA done.  Pain does improve with nitroglycerin.  Could represent microvascular disease.  Given improvement with nitroglycerin, started Imdur 15 mg daily  Carotid artery stenosis: 1 to 39% bilateral stenosis on duplex 01/07/2020.  Follows with [facility]  DOE now improved.  Follows with pulmonary.  Abnormal echo with intraatrial shunt, likely PFO.    HTN: Continue losartan 50 mg daily, amlodipine 10 mg daily, HCTZ 12.5 mg daily.  BP well controlled.  DM-2 per IM  Hyperlipidemia: Continue simvastatin 20 mg daily.  LDL 104 on 05/01/2021.  Given her carotid stenosis, recommend goal LDL  less than 70.  Changed to atorvastatin 20 mg daily, will repeat lipid panel   RTC in***  Signed, Little Ishikawa, MD  02/11/2022 8:07 PM

## 2022-02-14 ENCOUNTER — Ambulatory Visit: Payer: Medicare Other | Admitting: Cardiology

## 2022-02-15 ENCOUNTER — Encounter: Payer: Medicare Other | Admitting: General Surgery

## 2022-02-15 ENCOUNTER — Encounter: Payer: Self-pay | Admitting: General Surgery

## 2022-02-15 ENCOUNTER — Ambulatory Visit (INDEPENDENT_AMBULATORY_CARE_PROVIDER_SITE_OTHER): Payer: Medicare Other | Admitting: General Surgery

## 2022-02-15 VITALS — BP 125/77 | HR 78 | Temp 98.0°F | Resp 16 | Ht 65.0 in | Wt 171.0 lb

## 2022-02-15 DIAGNOSIS — K432 Incisional hernia without obstruction or gangrene: Secondary | ICD-10-CM

## 2022-02-15 NOTE — Patient Instructions (Signed)
Keep eye on area and leave marker on th skin.  ?

## 2022-02-15 NOTE — Progress Notes (Signed)
[facility] ? ?Incision looking better. Skin is just indurated on the right side. More like irritation than cellulitis. Marked and will monitor. ? ?BP 125/77   Pulse 78   Temp 98 ?F (36.7 ?C) (Oral)   Resp 16   Ht 5\' 5"  (1.651 m)   Wt 171 lb (77.6 kg)   SpO2 95%   BMI 28.46 kg/m?  ?Incision intact with some induration and irritation  ? ?Will see next week to check on wound  ? ?Curlene Labrum, MD ?[facility] ?[place], [place] [address] ?[phone] (office) ? ?Future Appointments  ?Date Time Provider [place]  ?02/20/2022 10:15 AM Virl Cagey, MD [facility] None  ?03/02/2022 10:30 AM Erenest Rasher, PA-C [facility]  ?05/31/2022  9:30 AM Jessy Oto, MD [facility] None  ?06/12/2022  1:40 PM Donato Heinz, MD [facility] [place] [facility]  ? ? ?

## 2022-02-20 ENCOUNTER — Encounter: Payer: Self-pay | Admitting: General Surgery

## 2022-02-20 ENCOUNTER — Ambulatory Visit (INDEPENDENT_AMBULATORY_CARE_PROVIDER_SITE_OTHER): Payer: Medicare Other | Admitting: General Surgery

## 2022-02-20 VITALS — BP 108/68 | HR 86 | Temp 98.8°F | Resp 16 | Ht 65.0 in | Wt 174.0 lb

## 2022-02-20 DIAGNOSIS — K439 Ventral hernia without obstruction or gangrene: Secondary | ICD-10-CM

## 2022-02-20 NOTE — Patient Instructions (Signed)
Area looks just irritated and firm from scarring. Will improve with time. ?Ok to wash area and put coco butter or coconut oil etc on the area.  ? ?

## 2022-02-21 NOTE — Progress Notes (Signed)
[facility] ? ?Incision look good. The questionable redness is more like induration now and there is no spreading. Skin is flaking and they have been using alcohol on the area.  ? ?BP 108/68   Pulse 86   Temp 98.8 ?F (37.1 ?C) (Oral)   Resp 16   Ht 5\' 5"  (1.651 m)   Wt 174 lb (78.9 kg)   SpO2 91%   BMI 28.96 kg/m?  ? ?Incision healing, no sign of hernia recurrence. Has some skin induration and this is likely form the inflammatory reaction with the mesh given the limited subcutaneous tissue between that and the skin.  ? ?Area looks just irritated and firm from scarring. Will improve with time. ?Ok to wash area and put coco butter or coconut oil etc on the area.  ?Call with questions or concerns. ? ? , MD ?[facility] ?[address] ?[place], [place] ?[phone] (office) ? ?

## 2022-03-01 NOTE — Progress Notes (Signed)
Referring Provider: Mirna Mires, MD Primary Care Physician:  Mirna Mires, MD Primary GI Physician: Dr. Marletta Lor  Chief Complaint  Patient presents with   Follow-up    HPI:   [name] is a 71 y.o. female with history of dysphagia s/p empiric dilation in 2020, constipation, GERD, small bowel malrotation. Exploratory laparotomy with lysis of adhesions/reduction of small bowel to the right side of the abdomen and colon to the left side of abdomen (Ladd's procedure), incisional hernia repair with mesh 03/15/21. Ventral hernia repair on 01/18/2022. No prior colonoscopy with patient declining thus far. She is presenting today for follow-up of GERD. Last seen in 2021.   Today:  No concerns. GERD well controlled on Protonix 40 mg daily.  Denies nausea, vomiting, dysphagia.  No change in bowel habits.  Denies BRBPR or melena.  She again states she is not interested in a colonoscopy.  Also declining Cologuard.  Past Medical History:  Diagnosis Date   Asthma    Bronchitis    Diabetes mellitus    Hyperlipidemia    Hypertension    Interatrial cardiac shunt    a. small secundum ASD vs pulmonary shunt.   Low back pain    Migraine    Mild carotid artery disease (HCC)    Osteoarthritis    Sciatica    Varicose veins    Vertigo     Past Surgical History:  Procedure Laterality Date   ABDOMINAL HYSTERECTOMY     BIOPSY  05/12/2019   Procedure: BIOPSY;  Surgeon: West Bali, MD;  [facility];  Service: Endoscopy;;   CARPAL TUNNEL RELEASE Bilateral    CESAREAN SECTION     ESOPHAGOGASTRODUODENOSCOPY (EGD) WITH PROPOFOL N/A 05/12/2019   Procedure: ESOPHAGOGASTRODUODENOSCOPY (EGD) WITH PROPOFOL;  Surgeon: West Bali, MD;  normal-appearing esophagus s/p empiric dilation, moderate gastritis due to ASA s/p biopsy, normal examined duodenum.  Biopsies with gastritis.    EXCISION OF MESH N/A 01/18/2022   Procedure: PARTIAL EXCISION OF PRIOR MESH;  Surgeon: Lucretia Roers,  MD;  Location: [facility];  Service: General;  Laterality: N/A;   INCISIONAL HERNIA REPAIR N/A 03/15/2021   Procedure: Linus Mako WITH MESH;  Surgeon: Lucretia Roers, MD;  Location: [facility];  Service: General;  Laterality: N/A;   INCISIONAL HERNIA REPAIR N/A 01/18/2022   Procedure: HERNIA REPAIR INCISIONAL W/ MESH;  Surgeon: Lucretia Roers, MD;  Location: [facility];  Service: General;  Laterality: N/A;   INGUINAL HERNIA REPAIR Left    LAPAROTOMY N/A 03/15/2021   Procedure: EXPLORATORY LAPAROTOMY;  Surgeon: Lucretia Roers, MD;  Location: [facility];  Service: General;  Laterality: N/A;   LUMBAR FUSION     LYSIS OF ADHESION N/A 03/15/2021   Procedure: LYSIS OF ADHESION;  Surgeon: Lucretia Roers, MD;  Location: [facility];  Service: General;  Laterality: N/A;   POSTERIOR LUMBAR FUSION 4 LEVEL N/A 03/04/2018   Procedure: Right transforaminal lumbar interbody fusion L1-2, L2-3 Posterior fusion T10, T11, T12, L1, L2 with T 11, T12 pedicle screws, superior sublaminar hooks T10, local bone graft, allograft cancellous chips Vivigen;  Surgeon: Kerrin Champagne, MD;  [facility];  Service: Orthopedics;  Laterality: N/A;   SAVORY DILATION N/A 05/12/2019   Procedure: SAVORY DILATION;  Surgeon: West Bali, MD;  [facility];  Service: Endoscopy;  Laterality: N/A;    Current Outpatient Medications  Medication Sig Dispense Refill   amLODipine (NORVASC) 10 MG tablet Take 10 mg  by mouth daily.     AquaLance Lancets 30G MISC      atorvastatin (LIPITOR) 40 MG tablet Take 40 mg by mouth daily.     dicyclomine (BENTYL) 10 MG capsule Take 1 capsule (10 mg total) by mouth 4 (four) times daily -  before meals and at bedtime. 30 capsule 1   fluorometholone (FML) 0.1 % ophthalmic suspension Place 1 drop into both eyes daily as needed (irritation).     HUMULIN 70/30 (70-30) 100 UNIT/ML injection Inject 45 Units into the skin 2 (two) times daily with a meal. 10 mL 11   hydrochlorothiazide  (MICROZIDE) 12.5 MG capsule Take 1 capsule (12.5 mg total) by mouth daily. 30 capsule 0   losartan (COZAAR) 50 MG tablet Take 50 mg by mouth daily.  4   magnesium hydroxide (MILK OF MAGNESIA) 400 MG/5ML suspension Take 30 mLs by mouth daily as needed for mild constipation or moderate constipation. 355 mL 0   Meloxicam 15 MG TBDP Take 1 tablet by mouth daily with breakfast. 90 tablet 4   metFORMIN (GLUCOPHAGE) 1000 MG tablet Take 1,000 mg by mouth at bedtime.     methocarbamol (ROBAXIN) 500 MG tablet Take 500 mg by mouth every 8 (eight) hours as needed for muscle spasms.     nitroGLYCERIN (NITROSTAT) 0.4 MG SL tablet Place 1 tablet (0.4 mg total) under the tongue every 5 (five) minutes as needed for chest pain. Dissolve one tablet under the tongue every 5 mintues as needed for chest pain. 25 tablet 2   polyethylene glycol (MIRALAX / GLYCOLAX) 17 g packet Take 17 g by mouth daily. 30 each 1   RYBELSUS 14 MG TABS Take 14 mg by mouth daily.     senna-docusate (SENOKOT-S) 8.6-50 MG tablet Take 2 tablets by mouth at bedtime. (Patient taking differently: Take 2 tablets by mouth at bedtime as needed for moderate constipation.) 60 tablet 11   simethicone (MYLICON) 80 MG chewable tablet Chew 0.5 tablets (40 mg total) by mouth every 6 (six) hours as needed for flatulence (bloating). 30 tablet 0   SURE COMFORT INSULIN SYRINGE 31G X 5/16" 1 ML MISC      traMADol (ULTRAM) 50 MG tablet Take 1 tablet (50 mg total) by mouth every 6 (six) hours as needed for severe pain. 30 tablet 0   isosorbide mononitrate (IMDUR) 30 MG 24 hr tablet Take 0.5 tablets (15 mg total) by mouth daily. 45 tablet 3   ondansetron (ZOFRAN-ODT) 4 MG disintegrating tablet Take 1 tablet (4 mg total) by mouth every 6 (six) hours as needed for nausea. (Patient not taking: Reported on 03/02/2022) 20 tablet 0   pantoprazole (PROTONIX) 40 MG tablet Take 1 tablet (40 mg total) by mouth daily. 90 tablet 3   No current facility-administered medications  for this visit.    Allergies as of 03/02/2022   (No Known Allergies)    Family History  Problem Relation Age of Onset   Lung cancer Mother    Breast cancer Mother    Stroke Father    Heart failure Sister    Diabetes Sister    Heart failure Sister    Diabetes Sister    Colon cancer Neg Hx    Gastric cancer Neg Hx    Esophageal cancer Neg Hx     Social History   Socioeconomic History   Marital status: Single    Spouse name: Not on file   Number of children: Not on file   Years of education:  12th    Highest education level: Not on file  Occupational History   Occupation: Disabled    Employer: UNEMPLOYED  Tobacco Use   Smoking status: Never   Smokeless tobacco: Never  Vaping Use   Vaping Use: Never used  Substance and Sexual Activity   Alcohol use: No    Alcohol/week: 0.0 standard drinks   Drug use: No   Sexual activity: Yes    Birth control/protection: Surgical  Other Topics Concern   Not on file  Social History Narrative   No caffeine use. 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 2 CHILDREN: 1 IN [place], 1 IN MILITARY CURRENTLY STATIONED IN [place].   Social Determinants of Health   Financial Resource Strain: Not on file  Food Insecurity: Not on file  Transportation Needs: Not on file  Physical Activity: Not on file  Stress: Not on file  Social Connections: Not on file    Review of Systems: Gen: Denies fever, chills, cold or flulike symptoms, presyncope, syncope. CV: Denies chest pain, palpitations. Resp: Denies dyspnea or cough. GI: See HPI Heme: See HPI  Physical Exam: BP 124/60   Pulse 94   Temp (!) 97.5 F (36.4 C)   Ht 5\' 5"  (1.651 m)   Wt 176 lb (79.8 kg)   BMI 29.29 kg/m  General:   Alert and oriented. No distress noted. Pleasant and cooperative. Walking with a cane.  Head:  Normocephalic and atraumatic. Eyes:  Conjuctiva clear without scleral icterus. Heart:  S1, S2 present without murmurs  appreciated. Lungs:  Clear to auscultation bilaterally. No wheezes, rales, or rhonchi. No distress.  Abdomen:  +BS, soft, non-tender and non-distended. No rebound or guarding.  Well healed, vertical midline scar from the epigastric area around right side of navel to suprapubic area. No HSM or masses noted. Msk:  Symmetrical without gross deformities. Normal posture. Extremities:  Without edema. Neurologic:  Alert and  oriented x4 Psych:  Normal mood and affect.    Assessment:  71 year old female with history of dysphagia s/p empiric dilation in 2020 with clinical improvement, constipation, GERD, small bowel malrotation.  Exploratory laparotomy with lysis of adhesions/reduction of small bowel to the right side of the abdomen and colon to the left side of abdomen (Ladd's procedure), incisional hernia repair with mesh 03/15/21. Ventral hernia repair on 01/18/2022. She is presenting today for follow-up of GERD.   GERD:  Well-controlled on pantoprazole 40 mg daily.  No alarm symptoms.  Colon cancer screening: Patient declined colonoscopy and Cologuard.  States she is not interested in ever having either of these completed unless she has to.    Plan:  Continue pantoprazole 40 mg daily 30 minutes before breakfast. Reinforced GERD diet/lifestyle. Follow-up in 1 year or sooner if needed.   03/20/2022, PA-C [facility] 03/02/2022

## 2022-03-02 ENCOUNTER — Encounter: Payer: Self-pay | Admitting: Gastroenterology

## 2022-03-02 ENCOUNTER — Ambulatory Visit: Payer: Medicare Other | Admitting: Gastroenterology

## 2022-03-02 VITALS — BP 124/60 | HR 94 | Temp 97.5°F | Ht 65.0 in | Wt 176.0 lb

## 2022-03-02 DIAGNOSIS — Z1211 Encounter for screening for malignant neoplasm of colon: Secondary | ICD-10-CM | POA: Diagnosis not present

## 2022-03-02 DIAGNOSIS — K219 Gastro-esophageal reflux disease without esophagitis: Secondary | ICD-10-CM | POA: Diagnosis not present

## 2022-03-02 MED ORDER — PANTOPRAZOLE SODIUM 40 MG PO TBEC: 40.0000 mg | DELAYED_RELEASE_TABLET | Freq: Every day | ORAL | 3 refills | Status: DC

## 2022-03-02 NOTE — Patient Instructions (Signed)
Continue taking Protonix (pantoprazole) 40 mg daily 30 minutes before breakfast.  To help prevent reflux symptoms: Avoid fried, fatty, greasy, spicy, citrus foods. Avoid caffeine and carbonated beverages. Avoid chocolate. Try eating 4-6 small meals a day rather than 3 large meals. Do not eat within 3 hours of laying down. Prop head of bed up on wood or bricks to create a 6 inch incline.  We will plan to see you back in 1 year.  Do not hesitate to call if you have any questions or concerns prior to your next visit.  It was great to see you again today!  I am glad you are doing well overall!  Ermalinda Memos, PA-C [facility]

## 2022-03-05 DIAGNOSIS — E11649 Type 2 diabetes mellitus with hypoglycemia without coma: Secondary | ICD-10-CM | POA: Diagnosis not present

## 2022-03-05 DIAGNOSIS — E785 Hyperlipidemia, unspecified: Secondary | ICD-10-CM | POA: Diagnosis not present

## 2022-03-05 DIAGNOSIS — I10 Essential (primary) hypertension: Secondary | ICD-10-CM | POA: Diagnosis not present

## 2022-03-05 DIAGNOSIS — E1169 Type 2 diabetes mellitus with other specified complication: Secondary | ICD-10-CM | POA: Diagnosis not present

## 2022-03-14 DIAGNOSIS — E785 Hyperlipidemia, unspecified: Secondary | ICD-10-CM | POA: Diagnosis not present

## 2022-03-14 DIAGNOSIS — I10 Essential (primary) hypertension: Secondary | ICD-10-CM | POA: Diagnosis not present

## 2022-05-14 DIAGNOSIS — R42 Dizziness and giddiness: Secondary | ICD-10-CM | POA: Diagnosis not present

## 2022-05-14 DIAGNOSIS — Z0000 Encounter for general adult medical examination without abnormal findings: Secondary | ICD-10-CM | POA: Diagnosis not present

## 2022-05-14 DIAGNOSIS — I10 Essential (primary) hypertension: Secondary | ICD-10-CM | POA: Diagnosis not present

## 2022-05-14 DIAGNOSIS — E785 Hyperlipidemia, unspecified: Secondary | ICD-10-CM | POA: Diagnosis not present

## 2022-05-14 DIAGNOSIS — K439 Ventral hernia without obstruction or gangrene: Secondary | ICD-10-CM | POA: Diagnosis not present

## 2022-05-14 DIAGNOSIS — E1169 Type 2 diabetes mellitus with other specified complication: Secondary | ICD-10-CM | POA: Diagnosis not present

## 2022-05-31 ENCOUNTER — Ambulatory Visit: Payer: Medicare Other | Admitting: Specialist

## 2022-06-07 ENCOUNTER — Ambulatory Visit: Payer: Medicare Other | Admitting: Specialist

## 2022-06-11 NOTE — Progress Notes (Unsigned)
Cardiology Office Note   Date:  06/13/2022   ID:  Atari, Edley 1951/05/22, MRN [id number]  PCP:  Mirna Mires, MD  Cardiologist:  Was Dr. Lonn Georgia     Chief Complaint  Patient presents with   Chest Pain       History of Present Illness: [name] is a 70 y.o. female who presents for follow up of hx chest pain.   History of mild carotid artery disease (followed by [facility]), HTN, HLD (followed in primary care), DM, asthma, migraines, varicose veins with mild chronic edema, arthritis who presents for follow-up.    She has history of coronary CTA in 05/2019 showing no significant CAD or incidental findings, small secundum ASD noted. She recently saw GI and reported inspirational chest pain so CTA was ordered 02/11/20 and was negative for PE or acute abnormalities. She was recommended to take ibuprofen. She was subsequently admitted 02/22/20 with worsening SOB and continued intermittent chest pain, more with exertion and recumbency. hsTroponins were normal, BNP normal, and Covid was negative. LE venous duplex showed no DVT. There was no obvious cardiac etiology for her chest pain. She had an echo performed 02/23/20 to evaluate for the reported ASD on study last year. This showed EF 60-65%, mild LVH, normal RV, no significant valvular disease, bubble study positive within 3-6 cardiac cycles suggestive of intraatrial shunt, right to left shunt is small at rest, moderate following the Valsalva maneuver. This was not felt significantly different than prior study.    Since last clinic visit, she reports she is doing okay.  Reports continues to have intermittent chest pain, unchanged for years.  Describes pain in center chest, lasts 10 to 15 minutes.  Describes sharp pain, can occur if turns her body certain ways.  She walks daily for 2 hours, denies any exertional chest pain.  Denies any dyspnea, lightheadedness, syncope, lower extremity edema, or palpitations.  Past Medical History:   Diagnosis Date   Asthma    Bronchitis    Diabetes mellitus    Hyperlipidemia    Hypertension    Interatrial cardiac shunt    a. small secundum ASD vs pulmonary shunt.   Low back pain    Migraine    Mild carotid artery disease (HCC)    Osteoarthritis    Sciatica    Varicose veins    Vertigo     Past Surgical History:  Procedure Laterality Date   ABDOMINAL HYSTERECTOMY     BIOPSY  05/12/2019   Procedure: BIOPSY;  Surgeon: West Bali, MD;  [facility];  Service: Endoscopy;;   CARPAL TUNNEL RELEASE Bilateral    CESAREAN SECTION     ESOPHAGOGASTRODUODENOSCOPY (EGD) WITH PROPOFOL N/A 05/12/2019   Procedure: ESOPHAGOGASTRODUODENOSCOPY (EGD) WITH PROPOFOL;  Surgeon: West Bali, MD;  normal-appearing esophagus s/p empiric dilation, moderate gastritis due to ASA s/p biopsy, normal examined duodenum.  Biopsies with gastritis.    EXCISION OF MESH N/A 01/18/2022   Procedure: PARTIAL EXCISION OF PRIOR MESH;  Surgeon: Lucretia Roers, MD;  [facility]: [facility];  Service: General;  Laterality: N/A;   INCISIONAL HERNIA REPAIR N/A 03/15/2021   Procedure: Linus Mako WITH MESH;  Surgeon: Lucretia Roers, MD;  Location: [facility];  Service: General;  Laterality: N/A;   INCISIONAL HERNIA REPAIR N/A 01/18/2022   Procedure: HERNIA REPAIR INCISIONAL W/ MESH;  Surgeon: Lucretia Roers, MD;  Location: [facility];  Service: General;  Laterality: N/A;   INGUINAL HERNIA REPAIR Left  LAPAROTOMY N/A 03/15/2021   Procedure: EXPLORATORY LAPAROTOMY;  Surgeon: Lucretia Roers, MD;  Location: [facility];  Service: General;  Laterality: N/A;   LUMBAR FUSION     LYSIS OF ADHESION N/A 03/15/2021   Procedure: LYSIS OF ADHESION;  Surgeon: Lucretia Roers, MD;  Location: [facility];  Service: General;  Laterality: N/A;   POSTERIOR LUMBAR FUSION 4 LEVEL N/A 03/04/2018   Procedure: Right transforaminal lumbar interbody fusion L1-2, L2-3 Posterior fusion T10, T11, T12, L1, L2 with T 11, T12 pedicle  screws, superior sublaminar hooks T10, local bone graft, allograft cancellous chips Vivigen;  Surgeon: Kerrin Champagne, MD;  Location: [facility];  Service: Orthopedics;  Laterality: N/A;   SAVORY DILATION N/A 05/12/2019   Procedure: SAVORY DILATION;  Surgeon: West Bali, MD;  [facility];  Service: Endoscopy;  Laterality: N/A;     Current Outpatient Medications  Medication Sig Dispense Refill   amLODipine (NORVASC) 10 MG tablet Take 10 mg by mouth daily.     AquaLance Lancets 30G MISC      atorvastatin (LIPITOR) 40 MG tablet Take 40 mg by mouth daily.     dicyclomine (BENTYL) 10 MG capsule Take 1 capsule (10 mg total) by mouth 4 (four) times daily -  before meals and at bedtime. 30 capsule 1   fluorometholone (FML) 0.1 % ophthalmic suspension Place 1 drop into both eyes daily as needed (irritation).     HUMULIN 70/30 (70-30) 100 UNIT/ML injection Inject 45 Units into the skin 2 (two) times daily with a meal. 10 mL 11   hydrochlorothiazide (MICROZIDE) 12.5 MG capsule Take 1 capsule (12.5 mg total) by mouth daily. 30 capsule 0   isosorbide mononitrate (IMDUR) 30 MG 24 hr tablet Take 0.5 tablets (15 mg total) by mouth daily. 45 tablet 3   losartan (COZAAR) 50 MG tablet Take 50 mg by mouth daily.  4   magnesium hydroxide (MILK OF MAGNESIA) 400 MG/5ML suspension Take 30 mLs by mouth daily as needed for mild constipation or moderate constipation. 355 mL 0   Meloxicam 15 MG TBDP Take 1 tablet by mouth daily with breakfast. 90 tablet 4   metFORMIN (GLUCOPHAGE) 1000 MG tablet Take 1,000 mg by mouth at bedtime.     methocarbamol (ROBAXIN) 500 MG tablet Take 500 mg by mouth every 8 (eight) hours as needed for muscle spasms.     nitroGLYCERIN (NITROSTAT) 0.4 MG SL tablet Place 1 tablet (0.4 mg total) under the tongue every 5 (five) minutes as needed for chest pain. Dissolve one tablet under the tongue every 5 mintues as needed for chest pain. 25 tablet 2   ondansetron (ZOFRAN-ODT) 4 MG  disintegrating tablet Take 1 tablet (4 mg total) by mouth every 6 (six) hours as needed for nausea. 20 tablet 0   pantoprazole (PROTONIX) 40 MG tablet Take 1 tablet (40 mg total) by mouth daily. 90 tablet 3   polyethylene glycol (MIRALAX / GLYCOLAX) 17 g packet Take 17 g by mouth daily. 30 each 1   RYBELSUS 14 MG TABS Take 14 mg by mouth daily.     simethicone (MYLICON) 80 MG chewable tablet Chew 0.5 tablets (40 mg total) by mouth every 6 (six) hours as needed for flatulence (bloating). 30 tablet 0   SURE COMFORT INSULIN SYRINGE 31G X 5/16" 1 ML MISC      traMADol (ULTRAM) 50 MG tablet Take 1 tablet (50 mg total) by mouth every 6 (six) hours as needed for severe pain.  30 tablet 0   ezetimibe (ZETIA) 10 MG tablet Take 1 tablet (10 mg total) by mouth daily. 90 tablet 3   hydrochlorothiazide (HYDRODIURIL) 25 MG tablet Take 25 mg by mouth daily.     No current facility-administered medications for this visit.    Allergies:   Patient has no known allergies.    Social History:  The patient  reports that she has never smoked. She has never used smokeless tobacco. She reports that she does not drink alcohol and does not use drugs.   Family History:  The patient's family history includes Breast cancer in her mother; Diabetes in her sister and sister; Heart failure in her sister and sister; Lung cancer in her mother; Stroke in her father.    ROS:  per HPI  Wt Readings from Last 3 Encounters:  06/12/22 177 lb 3.2 oz (80.4 kg)  03/02/22 176 lb (79.8 kg)  02/20/22 174 lb (78.9 kg)     PHYSICAL EXAM: VS:  BP 128/62   Pulse 89   Ht 5\' 5"  (1.651 m)   Wt 177 lb 3.2 oz (80.4 kg)   SpO2 96%   BMI 29.49 kg/m  , BMI Body mass index is 29.49 kg/m. General:Pleasant affect, NAD Skin:Warm and dry,  HEENT:normocephalic Neck:supple, no JVD Heart:S1S2 RRR, 2/6 systolic murmur Lungs:clear without rales, rhonchi, or wheezes , non tender Ext:no lower ext edema Neuro:alert and  oriented    EKG:  EKG today shows sinus rhythm, rate 80, no ST abnormalities    Recent Labs: 01/20/2022: Magnesium 1.9 01/23/2022: Hemoglobin 11.9; Platelets 311 06/12/2022: BUN 17; Creatinine, Ser 0.88; Potassium 4.5; Sodium 137    Lipid Panel    Component Value Date/Time   CHOL 195 06/12/2022 1426   TRIG 281 (H) 06/12/2022 1426   HDL 31 (L) 06/12/2022 1426   CHOLHDL 6.3 06/12/2022 1426   VLDL 56 (H) 06/12/2022 1426   LDLCALC 108 (H) 06/12/2022 1426       Other studies Reviewed: Additional studies/ records that were reviewed today include:  . 2D Echo 02/2020 1. Left ventricular ejection fraction, by estimation, is 60 to 65%. The  left ventricle has normal function. The left ventricle has no regional  wall motion abnormalities. There is mild concentric left ventricular  hypertrophy. Left ventricular diastolic  parameters were normal.   2. Right ventricular systolic function is normal. The right ventricular  size is normal. Tricuspid regurgitation signal is inadequate for assessing  PA pressure.   3. The mitral valve is normal in structure. No evidence of mitral valve  regurgitation. No evidence of mitral stenosis.   4. The aortic valve is normal in structure. Aortic valve regurgitation is  not visualized. No aortic stenosis is present.   5. The inferior vena cava is normal in size with greater than 50%  respiratory variability, suggesting right atrial pressure of 3 mmHg.   6. Agitated saline contrast bubble study was positive with shunting  observed within 3-6 cardiac cycles suggestive of interatrial shunt. The  right to left shunt is small at rest, moderate following the Valsalva  maneuver.   Comparison(s): No significant change from prior study.   Cor CT 05/2019  ADDENDUM REPORT: 06/11/2019 11:36   CLINICAL DATA:  44F with dyspnea and atypical chest pain.   EXAM: Cardiac/Coronary  CT   TECHNIQUE: The patient was scanned on a 06/13/2019.    FINDINGS: A 120 kV prospective scan was triggered in the descending thoracic aorta at 111 HU's. Axial non-contrast  3 mm slices were carried out through the heart. The data set was analyzed on a dedicated work station and scored using the [place]. Gantry rotation speed was 250 msecs and collimation was .6 mm. No beta blockade and 0.8 mg of sl NTG was given. The 3D data set was reconstructed in 5% intervals of the 67-82 % of the R-R cycle. Diastolic phases were analyzed on a dedicated work station using MPR, MIP and VRT modes. The patient received 80 cc of contrast.   Aorta: Normal size. Ascending aorta 3.3 cm. No calcifications. No dissection.   Aortic Valve: Trileaflet. Minimal calcification of the aortic annulus.   Coronary Arteries:  Normal coronary origin.  Right dominance.   RCA is a dominant artery that gives rise to PDA and PLVB. There is minimal soft attenuation plaque distally.   Left main is a large artery that gives rise to LAD, LCX, and RI arteries.   LAD is a large vessel that has no plaque. There is a small D1 without plaque.   LCX is a small, non-dominant artery.   There is no plaque.   Other findings:   Normal pulmonary vein drainage into the left atrium.   Normal let atrial appendage without a thrombus.   Small secundum ASD noted.  7.71mm x 3.2 mm.   Normal size of the pulmonary artery.   IMPRESSION: 1. Coronary calcium score of 0. This was 0 percentile for age and sex matched control.   2. Normal coronary origin with right dominance.   3. No evidence of CAD.   4.  Small secundum ASD noted.   Skeet Latch, MD       ASSESSMENT AND PLAN:  Chest pain: Her cardiac CTA in 2020 was normal, no CAD.  She continues to have intermittent chest pain, unchanged from when she had her CTA done.  Pain does improve with nitroglycerin.  Could represent microvascular disease.  Given improvement with nitroglycerin, continue Imdur 15 mg daily  Carotid  artery stenosis: 1 to 39% bilateral stenosis on duplex 01/07/2020.  Follows with [facility]  DOE now improved.  Follows with pulmonary.  Abnormal echo with intraatrial shunt, likely PFO.    HTN: Continue losartan 50 mg daily, amlodipine 10 mg daily, HCTZ 25 mg daily.  BP well controlled.  DM-2 per IM  Hyperlipidemia: Continue simvastatin 20 mg daily.  LDL 104 on 05/01/2021.  Given her carotid stenosis, recommend goal LDL less than 70, changed to atorvastatin 20 mg daily.  Check lipid panel   RTC in 1 year  Signed, Donato Heinz, MD  06/13/2022 2:44 PM

## 2022-06-12 ENCOUNTER — Encounter: Payer: Self-pay | Admitting: Cardiology

## 2022-06-12 ENCOUNTER — Other Ambulatory Visit (HOSPITAL_COMMUNITY)
Admission: RE | Admit: 2022-06-12 | Discharge: 2022-06-12 | Disposition: A | Discharge status: Home / Self Care | Payer: Medicare Other | Source: Ambulatory Visit | Attending: Cardiology | Admitting: Cardiology

## 2022-06-12 ENCOUNTER — Ambulatory Visit: Payer: Medicare Other | Attending: Cardiology | Admitting: Cardiology

## 2022-06-12 VITALS — BP 128/62 | HR 89 | Ht 65.0 in | Wt 177.2 lb

## 2022-06-12 DIAGNOSIS — R079 Chest pain, unspecified: Secondary | ICD-10-CM

## 2022-06-12 DIAGNOSIS — E785 Hyperlipidemia, unspecified: Secondary | ICD-10-CM | POA: Insufficient documentation

## 2022-06-12 DIAGNOSIS — I10 Essential (primary) hypertension: Secondary | ICD-10-CM

## 2022-06-12 DIAGNOSIS — I6523 Occlusion and stenosis of bilateral carotid arteries: Secondary | ICD-10-CM | POA: Diagnosis not present

## 2022-06-12 LAB — BASIC METABOLIC PANEL
Anion gap: 12 (ref 5–15)
BUN: 17 mg/dL (ref 8–23)
CO2: 27 mmol/L (ref 22–32)
Calcium: 10 mg/dL (ref 8.9–10.3)
Chloride: 98 mmol/L (ref 98–111)
Creatinine, Ser: 0.88 mg/dL (ref 0.44–1.00)
GFR, Estimated: >60 mL/min (ref >60)
Glucose, Bld: 237 mg/dL — ABNORMAL HIGH (ref 70–99)
Potassium: 4.5 mmol/L (ref 3.5–5.1)
Sodium: 137 mmol/L (ref 135–145)

## 2022-06-12 LAB — LIPID PANEL
Cholesterol: 195 mg/dL (ref 0–200)
HDL: 31 mg/dL — ABNORMAL LOW (ref >40)
LDL Cholesterol: 108 mg/dL — ABNORMAL HIGH (ref 0–99)
Total CHOL/HDL Ratio: 6.3 RATIO
Triglycerides: 281 mg/dL — ABNORMAL HIGH (ref <150)
VLDL: 56 mg/dL — ABNORMAL HIGH (ref 0–40)

## 2022-06-12 NOTE — Patient Instructions (Signed)
Medication Instructions:  Your physician recommends that you continue on your current medications as directed. Please refer to the Current Medication list given to you today.   Labwork: Today: -BMET -Lipid Panel  Testing/Procedures: None  Follow-Up: Follow up in 1 year with Dr. Jenene Slicker or [facility]  Any Other Special Instructions Will Be Listed Below (If Applicable).     If you need a refill on your cardiac medications before your next appointment, please call your pharmacy.

## 2022-06-13 ENCOUNTER — Other Ambulatory Visit: Payer: Self-pay | Admitting: *Deleted

## 2022-06-13 MED ORDER — EZETIMIBE 10 MG PO TABS: 10.0000 mg | ORAL_TABLET | Freq: Every day | ORAL | 3 refills | Status: DC

## 2022-06-28 ENCOUNTER — Encounter: Payer: Self-pay | Admitting: Specialist

## 2022-06-28 ENCOUNTER — Ambulatory Visit: Payer: Medicare Other | Admitting: Specialist

## 2022-06-28 VITALS — BP 117/74 | HR 86 | Ht 65.0 in | Wt 177.2 lb

## 2022-06-28 DIAGNOSIS — Z981 Arthrodesis status: Secondary | ICD-10-CM | POA: Diagnosis not present

## 2022-06-28 DIAGNOSIS — M25551 Pain in right hip: Secondary | ICD-10-CM | POA: Diagnosis not present

## 2022-06-28 DIAGNOSIS — M461 Sacroiliitis, not elsewhere classified: Secondary | ICD-10-CM | POA: Diagnosis not present

## 2022-06-28 DIAGNOSIS — M47818 Spondylosis without myelopathy or radiculopathy, sacral and sacrococcygeal region: Secondary | ICD-10-CM

## 2022-06-28 DIAGNOSIS — M1611 Unilateral primary osteoarthritis, right hip: Secondary | ICD-10-CM | POA: Diagnosis not present

## 2022-06-28 DIAGNOSIS — M4326 Fusion of spine, lumbar region: Secondary | ICD-10-CM | POA: Diagnosis not present

## 2022-06-28 DIAGNOSIS — M1612 Unilateral primary osteoarthritis, left hip: Secondary | ICD-10-CM

## 2022-06-28 DIAGNOSIS — M5416 Radiculopathy, lumbar region: Secondary | ICD-10-CM | POA: Diagnosis not present

## 2022-06-28 NOTE — Progress Notes (Signed)
Office Visit Note   Patient: [name]           Date of Birth: 1950-12-11           MRN: [id number] Visit Date: 06/28/2022              Requested by: Mirna Mires, MD [address] [place],  [place] [address] PCP: Mirna Mires, MD   Assessment & Plan: Visit Diagnoses:  1. Sacroiliitis (HCC)   2. SI joint arthritis   3. S/P lumbar fusion   4. Lumbar radiculopathy   5. Fusion of spine of lumbar region   6. Pain of right hip   7. Unilateral primary osteoarthritis, right hip   8. Unilateral primary osteoarthritis, left hip      Plan: Avoid bending, stooping and avoid lifting weights greater than 10 lbs. Avoid prolong standing and walking. Avoid frequent bending and stooping  No lifting greater than 10 lbs. May use ice or moist heat for pain. Weight loss is of benefit. Handicap license is approved. Dr. Dairl Ponder secretary/Assistant will call to arrange for further sacroiliac injections.   If the pain recurrs call us and we would have Dr. Celina Blas secretary/Assistant call to arrange for further sacroiliac injections.      Follow-Up Instructions: Return in about 3 weeks (around 07/19/2022).   Orders:  No orders of the defined types were placed in this encounter.  No orders of the defined types were placed in this encounter.     Procedures: No procedures performed   Clinical Data: No additional findings.   Subjective: Chief Complaint  Patient presents with   Lower Back - Pain, Follow-up    Had SI injection on 01/01/22 and got 75% relief with it.    71 year old female with history of lumbar fusions with last surgery extension of her fusion to the T10 level. She is experiencing pain right posterior buttock. There is pain that is worse with standing and walking and as the day progresses. No bowel or bladder difficulty. She is able to grocery shop. She takes intermittant methocarbamol, had right SI injection by Dr. Alvester Morin and previous injections by  Dr.  Prince Rome. Requests considering further injection of the right SI joint.    Review of Systems  Constitutional: Negative.   HENT: Negative.    Eyes: Negative.   Respiratory: Negative.    Cardiovascular: Negative.   Gastrointestinal: Negative.   Endocrine: Negative.   Genitourinary: Negative.   Musculoskeletal: Negative.   Skin: Negative.   Allergic/Immunologic: Negative.   Neurological: Negative.   Hematological: Negative.   Psychiatric/Behavioral: Negative.       Objective: Vital Signs: BP 117/74 (BP Location: Left Arm, Patient Position: Sitting)   Pulse 86   Ht 5\' 5"  (1.651 m)   Wt 177 lb 3.2 oz (80.4 kg)   BMI 29.49 kg/m   Physical Exam Constitutional:      Appearance: She is well-developed.  HENT:     Head: Normocephalic and atraumatic.  Eyes:     Pupils: Pupils are equal, round, and reactive to light.  Pulmonary:     Effort: Pulmonary effort is normal.     Breath sounds: Normal breath sounds.  Abdominal:     General: Bowel sounds are normal.     Palpations: Abdomen is soft.  Musculoskeletal:     Cervical back: Normal range of motion and neck supple.     Lumbar back: Negative right straight leg raise test and negative left straight  leg raise test.  Skin:    General: Skin is warm and dry.  Neurological:     Mental Status: She is alert and oriented to person, place, and time.  Psychiatric:        Behavior: Behavior normal.        Thought Content: Thought content normal.        Judgment: Judgment normal.     Back Exam   Tenderness  The patient is experiencing tenderness in the lumbar.  Range of Motion  Extension:  abnormal  Flexion:  abnormal  Lateral bend right:  abnormal  Lateral bend left:  abnormal  Rotation right:  abnormal  Rotation left:  abnormal   Muscle Strength  Right Quadriceps:  5/5  Right Hamstrings:  5/5  Left Hamstrings:  5/5   Tests  Straight leg raise right: negative Straight leg raise left: negative  Reflexes  Patellar:   0/4 Achilles:  0/4  Other  Toe walk: normal Heel walk: normal Sensation: normal  Comments:  Stiffness in hip ROM and decrease right hip ER and IR and flexion.       Specialty Comments:  No specialty comments available.  Imaging: No results found.   PMFS History: Patient Active Problem List   Diagnosis Date Noted   Acute blood loss as cause of postoperative anemia 03/07/2018    Priority: High    Class: Acute   Spinal stenosis, lumbar region with neurogenic claudication 03/04/2018    Priority: High    Class: Chronic   Multilevel degenerative disc disease 03/04/2018    Priority: High    Class: Chronic   Forestier's disease of thoracolumbar region 03/04/2018    Priority: High    Class: Chronic   Colon cancer screening 03/02/2022   Ileus (HCC) 01/21/2022   Sacroiliitis (HCC) 11/09/2021   Ventral hernia without obstruction or gangrene 09/21/2021   Non-intractable vomiting    Lower abdominal pain 03/13/2021   Intractable abdominal pain 03/12/2021   Chest pain 02/14/2021   Incisional hernia, without obstruction or gangrene 12/08/2020   Malrotation of intestine 12/08/2020   GERD (gastroesophageal reflux disease) 09/22/2020   Gastritis 09/19/2020   Other chest pain 09/19/2020   Reducible bulge of abdominal wall 09/19/2020   Obesity 02/23/2020   Chest pain with moderate risk for cardiac etiology 02/22/2020   Dyspnea on exertion    Carotid artery stenosis 01/07/2020   Constipation 09/03/2019   Dysphagia 08/11/2018   Leg swelling 08/11/2018   Bilateral carotid bruits 08/11/2018   Abnormal EKG 08/06/2018   Fusion of spine of thoracolumbar region 03/04/2018   Pain in right hip 03/18/2017   Stiffness of joint, not elsewhere classified, other specified site 08/04/2013   Leg weakness, bilateral 08/04/2013   Chest pain at rest 05/10/2009   BENIGN POSITIONAL VERTIGO 07/30/2007   VARICOSE VEINS, LOWER EXTREMITIES 07/30/2007   SCIATICA, CHRONIC 07/30/2007   Diabetes (HCC)  11/18/2006   Hyperlipidemia 11/18/2006   MIGRAINE HEADACHE 11/18/2006   LESION, SCIATIC NERVE 11/18/2006   Essential hypertension 11/18/2006   HYPOTENSION, ORTHOSTATIC 11/18/2006   BRONCHITIS NOS 11/18/2006   Other emphysema (HCC) 11/18/2006   ASTHMA 11/18/2006   OSTEOARTHRITIS 11/18/2006   LOW BACK PAIN 11/18/2006   VERTIGO 11/18/2006   Past Medical History:  Diagnosis Date   Asthma    Bronchitis    Diabetes mellitus    Hyperlipidemia    Hypertension    Interatrial cardiac shunt    a. small secundum ASD vs pulmonary shunt.   Low back  pain    Migraine    Mild carotid artery disease (HCC)    Osteoarthritis    Sciatica    Varicose veins    Vertigo     Family History  Problem Relation Age of Onset   Lung cancer Mother    Breast cancer Mother    Stroke Father    Heart failure Sister    Diabetes Sister    Heart failure Sister    Diabetes Sister    Colon cancer Neg Hx    Gastric cancer Neg Hx    Esophageal cancer Neg Hx     Past Surgical History:  Procedure Laterality Date   ABDOMINAL HYSTERECTOMY     BIOPSY  05/12/2019   Procedure: BIOPSY;  Surgeon: West Bali, MD;  [facility];  Service: Endoscopy;;   CARPAL TUNNEL RELEASE Bilateral    CESAREAN SECTION     ESOPHAGOGASTRODUODENOSCOPY (EGD) WITH PROPOFOL N/A 05/12/2019   Procedure: ESOPHAGOGASTRODUODENOSCOPY (EGD) WITH PROPOFOL;  Surgeon: West Bali, MD;  normal-appearing esophagus s/p empiric dilation, moderate gastritis due to ASA s/p biopsy, normal examined duodenum.  Biopsies with gastritis.    EXCISION OF MESH N/A 01/18/2022   Procedure: PARTIAL EXCISION OF PRIOR MESH;  Surgeon: Lucretia Roers, MD;  Location: [facility];  Service: General;  Laterality: N/A;   INCISIONAL HERNIA REPAIR N/A 03/15/2021   Procedure: Linus Mako WITH MESH;  Surgeon: Lucretia Roers, MD;  Location: [facility];  Service: General;  Laterality: N/A;   INCISIONAL HERNIA REPAIR N/A 01/18/2022   Procedure: HERNIA  REPAIR INCISIONAL W/ MESH;  Surgeon: Lucretia Roers, MD;  Location: [facility];  Service: General;  Laterality: N/A;   INGUINAL HERNIA REPAIR Left    LAPAROTOMY N/A 03/15/2021   Procedure: EXPLORATORY LAPAROTOMY;  Surgeon: Lucretia Roers, MD;  Location: [facility];  Service: General;  Laterality: N/A;   LUMBAR FUSION     LYSIS OF ADHESION N/A 03/15/2021   Procedure: LYSIS OF ADHESION;  Surgeon: Lucretia Roers, MD;  Location: [facility];  Service: General;  Laterality: N/A;   POSTERIOR LUMBAR FUSION 4 LEVEL N/A 03/04/2018   Procedure: Right transforaminal lumbar interbody fusion L1-2, L2-3 Posterior fusion T10, T11, T12, L1, L2 with T 11, T12 pedicle screws, superior sublaminar hooks T10, local bone graft, allograft cancellous chips Vivigen;  Surgeon: Kerrin Champagne, MD;  Location: [facility];  Service: Orthopedics;  Laterality: N/A;   SAVORY DILATION N/A 05/12/2019   Procedure: SAVORY DILATION;  Surgeon: West Bali, MD;  [facility];  Service: Endoscopy;  Laterality: N/A;   Social History   Occupational History   Occupation: Disabled    Employer: UNEMPLOYED  Tobacco Use   Smoking status: Never   Smokeless tobacco: Never  Vaping Use   Vaping Use: Never used  Substance and Sexual Activity   Alcohol use: No    Alcohol/week: 0.0 standard drinks of alcohol   Drug use: No   Sexual activity: Yes    Birth control/protection: Surgical

## 2022-06-28 NOTE — Patient Instructions (Signed)
  Plan: Avoid bending, stooping and avoid lifting weights greater than 10 lbs. Avoid prolong standing and walking. Avoid frequent bending and stooping  No lifting greater than 10 lbs. May use ice or moist heat for pain. Weight loss is of benefit. Handicap license is approved. Dr. Dairl Ponder secretary/Assistant will call to arrange for further sacroiliac injections.   If the pain recurrs call us and we would have Dr. [place] Blas secretary/Assistant call to arrange for further sacroiliac injections.

## 2022-07-09 ENCOUNTER — Ambulatory Visit: Payer: Self-pay

## 2022-07-09 ENCOUNTER — Ambulatory Visit: Payer: Medicare Other | Admitting: Sports Medicine

## 2022-07-09 ENCOUNTER — Encounter: Payer: Self-pay | Admitting: Sports Medicine

## 2022-07-09 VITALS — BP 173/80 | HR 92

## 2022-07-09 DIAGNOSIS — M461 Sacroiliitis, not elsewhere classified: Secondary | ICD-10-CM

## 2022-07-09 NOTE — Progress Notes (Signed)
   Procedure Note  Patient: [name]             Date of Birth: 02/04/1951           MRN: [id number]             Visit Date: 07/09/2022  Procedures: Visit Diagnoses:  1. Sacroiliitis (HCC)     U/S-guided SI-joint injection, Right   After discussion of risk/benefits/indications, informed verbal consent was obtained. A timeout was then performed. The patient was positioned in a prone position on exam room table with a pillow placed under the pelvis for mild hip flexion. The SI joint area was cleaned and prepped with betadine and alcohol swabs. Sterile ultrasound gel was applied and the ultrasound transducer was placed in an anatomic axial plane over the PSIS, then moved distally over the SI-joint. Using ultrasound guidance, a 22-gauge, 3.5" needle was inserted from a medial to lateral approach utilizing an in-plane approach and directed into the SI-joint. The SI-joint was then injected with a mixture of 4:2 lidocaine:depomedrol with visualization of the injectate flow into the SI-joint under ultrasound visualization. The patient tolerated the procedure well without immediate complications.  - I evaluated the patient about 10 minutes post-injection and they had improvement in usual pain - follow-up with Dr. Louanne Skye as indicated; I am happy to see them as needed  Elba Barman, DO [place]  This note was dictated using Dragon naturally speaking software and may contain errors in syntax, spelling, or content which have not been identified prior to signing this note.

## 2022-07-19 ENCOUNTER — Encounter: Payer: Self-pay | Admitting: Specialist

## 2022-07-19 ENCOUNTER — Ambulatory Visit: Payer: Medicare Other | Admitting: Specialist

## 2022-07-19 VITALS — BP 112/67 | HR 47 | Ht 65.0 in | Wt 177.0 lb

## 2022-07-19 DIAGNOSIS — M5416 Radiculopathy, lumbar region: Secondary | ICD-10-CM | POA: Diagnosis not present

## 2022-07-19 DIAGNOSIS — Z981 Arthrodesis status: Secondary | ICD-10-CM | POA: Diagnosis not present

## 2022-07-19 DIAGNOSIS — M461 Sacroiliitis, not elsewhere classified: Secondary | ICD-10-CM

## 2022-07-19 DIAGNOSIS — M25551 Pain in right hip: Secondary | ICD-10-CM | POA: Diagnosis not present

## 2022-07-19 DIAGNOSIS — M47818 Spondylosis without myelopathy or radiculopathy, sacral and sacrococcygeal region: Secondary | ICD-10-CM

## 2022-07-19 DIAGNOSIS — M4326 Fusion of spine, lumbar region: Secondary | ICD-10-CM | POA: Diagnosis not present

## 2022-07-19 NOTE — Patient Instructions (Signed)
Plan: Avoid bending, stooping and avoid lifting weights greater than 10 lbs. Avoid prolong standing and walking. Avoid frequent bending and stooping  No lifting greater than 10 lbs. May use ice or moist heat for pain. Weight loss is of benefit. Handicap license is approved. Return appointment in 6 months for assessment of SI pain and degeneration, did well with SI injections with Dr. Rolena Infante.  Follow-Up Instructions: No follow-ups on file.

## 2022-07-19 NOTE — Progress Notes (Signed)
Office Visit Note   Patient: [name]           Date of Birth: Apr 27, 1951           MRN: [id number] Visit Date: 07/19/2022              Requested by: Mirna Mires, MD [address] [place],  [place] [address] PCP: Mirna Mires, MD   Assessment & Plan: Visit Diagnoses:  1. S/P lumbar fusion   2. Fusion of spine of lumbar region   3. SI joint arthritis   4. Sacroiliitis (HCC)   5. Pain of right hip   6. Lumbar radiculopathy     Plan: Avoid bending, stooping and avoid lifting weights greater than 10 lbs. Avoid prolong standing and walking. Avoid frequent bending and stooping  No lifting greater than 10 lbs. May use ice or moist heat for pain. Weight loss is of benefit. Handicap license is approved. Return appointment in 6 months for assessment of SI pain and degeneration, did well with SI injections with Dr. Shon Baton.  Follow-Up Instructions: No follow-ups on file.   Orders:  No orders of the defined types were placed in this encounter.  No orders of the defined types were placed in this encounter.     Procedures: No procedures performed   Clinical Data: No additional findings.   Subjective: Chief Complaint  Patient presents with   Lower Back - Follow-up    71 year old female with history of long thoracolumbar fusion for progressive spondylosis and ddd with neuroforamenal stenosis. Fusion from T10 to S1. She is not requiring any medication. Does not drive but is able to grocery shop. She has lots of grandchildren and  some great grandchildren. No increase.     Review of Systems  Constitutional: Negative.   HENT: Negative.    Eyes: Negative.   Respiratory: Negative.    Cardiovascular: Negative.   Gastrointestinal: Negative.   Endocrine: Negative.   Genitourinary: Negative.   Musculoskeletal: Negative.   Skin: Negative.   Allergic/Immunologic: Negative.   Neurological: Negative.   Hematological: Negative.   Psychiatric/Behavioral:  Negative.       Objective: Vital Signs: BP 112/67   Pulse (!) 47   Ht 5\' 5"  (1.651 m)   Wt 177 lb (80.3 kg)   BMI 29.45 kg/m   Physical Exam Constitutional:      Appearance: She is well-developed.  HENT:     Head: Normocephalic and atraumatic.  Eyes:     Pupils: Pupils are equal, round, and reactive to light.  Pulmonary:     Effort: Pulmonary effort is normal.     Breath sounds: Normal breath sounds.  Abdominal:     General: Bowel sounds are normal.     Palpations: Abdomen is soft.  Musculoskeletal:     Cervical back: Normal range of motion and neck supple.     Lumbar back: Negative right straight leg raise test and negative left straight leg raise test.  Skin:    General: Skin is warm and dry.  Neurological:     Mental Status: She is alert and oriented to person, place, and time.  Psychiatric:        Behavior: Behavior normal.        Thought Content: Thought content normal.        Judgment: Judgment normal.    Back Exam   Tenderness  The patient is experiencing tenderness in the lumbar.  Range of Motion  Extension:  abnormal  Flexion:  abnormal  Lateral bend right:  abnormal  Lateral bend left:  abnormal  Rotation right:  abnormal  Rotation left:  abnormal   Muscle Strength  Right Quadriceps:  5/5  Left Quadriceps:  5/5  Right Hamstrings:  5/5  Left Hamstrings:  5/5   Tests  Straight leg raise right: negative Straight leg raise left: negative  Other  Toe walk: normal Heel walk: normal Sensation: normal     Specialty Comments:  No specialty comments available.  Imaging: No results found.   PMFS History: Patient Active Problem List   Diagnosis Date Noted   Acute blood loss as cause of postoperative anemia 03/07/2018    Priority: High    Class: Acute   Spinal stenosis, lumbar region with neurogenic claudication 03/04/2018    Priority: High    Class: Chronic   Multilevel degenerative disc disease 03/04/2018    Priority: High     Class: Chronic   Forestier's disease of thoracolumbar region 03/04/2018    Priority: High    Class: Chronic   Colon cancer screening 03/02/2022   Ileus ([place]) 01/21/2022   Sacroiliitis ([place]) 11/09/2021   Ventral hernia without obstruction or gangrene 09/21/2021   Non-intractable vomiting    Lower abdominal pain 03/13/2021   Intractable abdominal pain 03/12/2021   Chest pain 02/14/2021   Incisional hernia, without obstruction or gangrene 12/08/2020   Malrotation of intestine 12/08/2020   GERD (gastroesophageal reflux disease) 09/22/2020   Gastritis 09/19/2020   Other chest pain 09/19/2020   Reducible bulge of abdominal wall 09/19/2020   Obesity 02/23/2020   Chest pain with moderate risk for cardiac etiology 02/22/2020   Dyspnea on exertion    Carotid artery stenosis 01/07/2020   Constipation 09/03/2019   Dysphagia 08/11/2018   Leg swelling 08/11/2018   Bilateral carotid bruits 08/11/2018   Abnormal EKG 08/06/2018   Fusion of spine of thoracolumbar region 03/04/2018   Pain in right hip 03/18/2017   Stiffness of joint, not elsewhere classified, other specified site 08/04/2013   Leg weakness, bilateral 08/04/2013   Chest pain at rest 05/10/2009   BENIGN POSITIONAL VERTIGO 07/30/2007   VARICOSE VEINS, LOWER EXTREMITIES 07/30/2007   SCIATICA, CHRONIC 07/30/2007   Diabetes ([place]) 11/18/2006   Hyperlipidemia 11/18/2006   MIGRAINE HEADACHE 11/18/2006   LESION, SCIATIC NERVE 11/18/2006   Essential hypertension 11/18/2006   HYPOTENSION, ORTHOSTATIC 11/18/2006   BRONCHITIS NOS 11/18/2006   Other emphysema ([place]) 11/18/2006   ASTHMA 11/18/2006   OSTEOARTHRITIS 11/18/2006   LOW BACK PAIN 11/18/2006   VERTIGO 11/18/2006   Past Medical History:  Diagnosis Date   Asthma    Bronchitis    Diabetes mellitus    Hyperlipidemia    Hypertension    Interatrial cardiac shunt    a. small secundum ASD vs pulmonary shunt.   Low back pain    Migraine    Mild carotid artery disease (HCC)     Osteoarthritis    Sciatica    Varicose veins    Vertigo     Family History  Problem Relation Age of Onset   Lung cancer Mother    Breast cancer Mother    Stroke Father    Heart failure Sister    Diabetes Sister    Heart failure Sister    Diabetes Sister    Colon cancer Neg Hx    Gastric cancer Neg Hx    Esophageal cancer Neg Hx     Past Surgical History:  Procedure Laterality Date  ABDOMINAL HYSTERECTOMY     BIOPSY  05/12/2019   Procedure: BIOPSY;  Surgeon: West Bali, MD;  [facility];  Service: Endoscopy;;   CARPAL TUNNEL RELEASE Bilateral    CESAREAN SECTION     ESOPHAGOGASTRODUODENOSCOPY (EGD) WITH PROPOFOL N/A 05/12/2019   Procedure: ESOPHAGOGASTRODUODENOSCOPY (EGD) WITH PROPOFOL;  Surgeon: West Bali, MD;  normal-appearing esophagus s/p empiric dilation, moderate gastritis due to ASA s/p biopsy, normal examined duodenum.  Biopsies with gastritis.    EXCISION OF MESH N/A 01/18/2022   Procedure: PARTIAL EXCISION OF PRIOR MESH;  Surgeon: Lucretia Roers, MD;  Location: [facility];  Service: General;  Laterality: N/A;   INCISIONAL HERNIA REPAIR N/A 03/15/2021   Procedure: Linus Mako WITH MESH;  Surgeon: Lucretia Roers, MD;  Location: [facility];  Service: General;  Laterality: N/A;   INCISIONAL HERNIA REPAIR N/A 01/18/2022   Procedure: HERNIA REPAIR INCISIONAL W/ MESH;  Surgeon: Lucretia Roers, MD;  Location[facility];  Service: General;  Laterality: N/A;   INGUINAL HERNIA REPAIR Left    LAPAROTOMY N/A 03/15/2021   Procedure: EXPLORATORY LAPAROTOMY;  Surgeon: Lucretia Roers, MD;  [facility];  Service: General;  Laterality: N/A;   LUMBAR FUSION     LYSIS OF ADHESION N/A 03/15/2021   Procedure: LYSIS OF ADHESION;  Surgeon: Lucretia Roers, MD;  [facility];  Service: General;  Laterality: N/A;   POSTERIOR LUMBAR FUSION 4 LEVEL N/A 03/04/2018   Procedure: Right transforaminal lumbar interbody fusion L1-2, L2-3 Posterior fusion T10,  T11, T12, L1, L2 with T 11, T12 pedicle screws, superior sublaminar hooks T10, local bone graft, allograft cancellous chips Vivigen;  Surgeon: Kerrin Champagne, MD;  [facility];  Service: Orthopedics;  Laterality: N/A;   SAVORY DILATION N/A 05/12/2019   Procedure: SAVORY DILATION;  Surgeon: West Bali, MD;  [facility];  Service: Endoscopy;  Laterality: N/A;   Social History   Occupational History   Occupation: Disabled    Employer: UNEMPLOYED  Tobacco Use   Smoking status: Never   Smokeless tobacco: Never  Vaping Use   Vaping Use: Never used  Substance and Sexual Activity   Alcohol use: No    Alcohol/week: 0.0 standard drinks of alcohol   Drug use: No   Sexual activity: Yes    Birth control/protection: Surgical

## 2022-07-31 ENCOUNTER — Other Ambulatory Visit: Payer: Self-pay | Admitting: Cardiology

## 2022-08-27 ENCOUNTER — Encounter (HOSPITAL_COMMUNITY): Payer: Self-pay

## 2022-08-27 ENCOUNTER — Other Ambulatory Visit: Payer: Self-pay

## 2022-08-27 ENCOUNTER — Emergency Department (HOSPITAL_COMMUNITY): Payer: Medicare Other

## 2022-08-27 ENCOUNTER — Emergency Department (HOSPITAL_COMMUNITY)
Admission: EM | Admit: 2022-08-27 | Discharge: 2022-08-27 | Disposition: A | Discharge status: Home / Self Care | Payer: Medicare Other | Attending: Emergency Medicine | Admitting: Emergency Medicine

## 2022-08-27 DIAGNOSIS — Z79899 Other long term (current) drug therapy: Secondary | ICD-10-CM | POA: Insufficient documentation

## 2022-08-27 DIAGNOSIS — R61 Generalized hyperhidrosis: Secondary | ICD-10-CM | POA: Diagnosis not present

## 2022-08-27 DIAGNOSIS — M1612 Unilateral primary osteoarthritis, left hip: Secondary | ICD-10-CM | POA: Diagnosis not present

## 2022-08-27 DIAGNOSIS — Z7984 Long term (current) use of oral hypoglycemic drugs: Secondary | ICD-10-CM | POA: Diagnosis not present

## 2022-08-27 DIAGNOSIS — E119 Type 2 diabetes mellitus without complications: Secondary | ICD-10-CM | POA: Diagnosis not present

## 2022-08-27 DIAGNOSIS — Z7982 Long term (current) use of aspirin: Secondary | ICD-10-CM | POA: Insufficient documentation

## 2022-08-27 DIAGNOSIS — R079 Chest pain, unspecified: Secondary | ICD-10-CM | POA: Diagnosis not present

## 2022-08-27 DIAGNOSIS — R0789 Other chest pain: Secondary | ICD-10-CM | POA: Insufficient documentation

## 2022-08-27 DIAGNOSIS — M79652 Pain in left thigh: Secondary | ICD-10-CM | POA: Diagnosis not present

## 2022-08-27 DIAGNOSIS — R6889 Other general symptoms and signs: Secondary | ICD-10-CM | POA: Diagnosis not present

## 2022-08-27 DIAGNOSIS — R0602 Shortness of breath: Secondary | ICD-10-CM | POA: Diagnosis not present

## 2022-08-27 DIAGNOSIS — I10 Essential (primary) hypertension: Secondary | ICD-10-CM | POA: Diagnosis not present

## 2022-08-27 DIAGNOSIS — Z743 Need for continuous supervision: Secondary | ICD-10-CM | POA: Diagnosis not present

## 2022-08-27 DIAGNOSIS — M549 Dorsalgia, unspecified: Secondary | ICD-10-CM | POA: Diagnosis not present

## 2022-08-27 DIAGNOSIS — I499 Cardiac arrhythmia, unspecified: Secondary | ICD-10-CM | POA: Diagnosis not present

## 2022-08-27 DIAGNOSIS — M8588 Other specified disorders of bone density and structure, other site: Secondary | ICD-10-CM | POA: Diagnosis not present

## 2022-08-27 DIAGNOSIS — Z794 Long term (current) use of insulin: Secondary | ICD-10-CM | POA: Insufficient documentation

## 2022-08-27 DIAGNOSIS — R42 Dizziness and giddiness: Secondary | ICD-10-CM | POA: Diagnosis not present

## 2022-08-27 DIAGNOSIS — T699XXA Effect of reduced temperature, unspecified, initial encounter: Secondary | ICD-10-CM | POA: Diagnosis not present

## 2022-08-27 DIAGNOSIS — R0689 Other abnormalities of breathing: Secondary | ICD-10-CM | POA: Diagnosis not present

## 2022-08-27 DIAGNOSIS — I2510 Atherosclerotic heart disease of native coronary artery without angina pectoris: Secondary | ICD-10-CM | POA: Insufficient documentation

## 2022-08-27 DIAGNOSIS — M545 Low back pain, unspecified: Secondary | ICD-10-CM | POA: Diagnosis not present

## 2022-08-27 LAB — CBC
HCT: 40.5 % (ref 36.0–46.0)
Hemoglobin: 13 g/dL (ref 12.0–15.0)
MCH: 29.7 pg (ref 26.0–34.0)
MCHC: 32.1 g/dL (ref 30.0–36.0)
MCV: 92.7 fL (ref 80.0–100.0)
Platelets: 244 10*3/uL (ref 150–400)
RBC: 4.37 MIL/uL (ref 3.87–5.11)
RDW: 13.6 % (ref 11.5–15.5)
WBC: 12 10*3/uL — ABNORMAL HIGH (ref 4.0–10.5)
nRBC: 0 % (ref 0.0–0.2)

## 2022-08-27 LAB — BASIC METABOLIC PANEL
Anion gap: 8 (ref 5–15)
BUN: 18 mg/dL (ref 8–23)
CO2: 29 mmol/L (ref 22–32)
Calcium: 9.6 mg/dL (ref 8.9–10.3)
Chloride: 101 mmol/L (ref 98–111)
Creatinine, Ser: 1.16 mg/dL — ABNORMAL HIGH (ref 0.44–1.00)
GFR, Estimated: 50 mL/min — ABNORMAL LOW (ref >60)
Glucose, Bld: 142 mg/dL — ABNORMAL HIGH (ref 70–99)
Potassium: 4.2 mmol/L (ref 3.5–5.1)
Sodium: 138 mmol/L (ref 135–145)

## 2022-08-27 LAB — D-DIMER, QUANTITATIVE: D-Dimer, Quant: 0.29 ug/mL-FEU (ref 0.00–0.50)

## 2022-08-27 LAB — TROPONIN I (HIGH SENSITIVITY)
Troponin I (High Sensitivity): 6 ng/L (ref <18)
Troponin I (High Sensitivity): 6 ng/L (ref <18)

## 2022-08-27 NOTE — ED Provider Notes (Signed)
[facility] EMERGENCY DEPARTMENT Provider Note   CSN: [id number] Arrival date & time: 08/27/22  1507     History {Add pertinent medical, surgical, social history, OB history to HPI:1} Chief Complaint  Patient presents with   Chest Pain    [name] is a 71 y.o. female.  HPI     Home Medications Prior to Admission medications   Medication Sig Start Date End Date Taking? Authorizing Provider  amLODipine (NORVASC) 10 MG tablet Take 10 mg by mouth daily. 01/02/21   [provider]  AquaLance Lancets 30G MISC  04/05/21   [provider]  atorvastatin (LIPITOR) 40 MG tablet Take 40 mg by mouth daily. 11/23/21   [provider]  dicyclomine (BENTYL) 10 MG capsule Take 1 capsule (10 mg total) by mouth 4 (four) times daily -  before meals and at bedtime. 02/01/22   Lucretia Roers, MD  ezetimibe (ZETIA) 10 MG tablet Take 1 tablet (10 mg total) by mouth daily. 06/13/22 06/08/23  Little Ishikawa, MD  fluorometholone (FML) 0.1 % ophthalmic suspension Place 1 drop into both eyes daily as needed (irritation). 02/24/21   [provider]  HUMULIN 70/30 (70-30) 100 UNIT/ML injection Inject 45 Units into the skin 2 (two) times daily with a meal. 03/21/21   Emokpae, Courage, MD  hydrochlorothiazide (HYDRODIURIL) 25 MG tablet Take 25 mg by mouth daily. 05/05/22   [provider]  hydrochlorothiazide (MICROZIDE) 12.5 MG capsule Take 1 capsule (12.5 mg total) by mouth daily. 03/21/21   Shon Hale, MD  isosorbide mononitrate (IMDUR) 30 MG 24 hr tablet Take 1/2 (one-half) tablet by mouth once daily 08/01/22   Little Ishikawa, MD  losartan (COZAAR) 50 MG tablet Take 50 mg by mouth daily. 04/23/18   [provider]  magnesium hydroxide (MILK OF MAGNESIA) 400 MG/5ML suspension Take 30 mLs by mouth daily as needed for mild constipation or moderate constipation. 01/21/22   Lucretia Roers, MD  Meloxicam 15 MG TBDP Take 1 tablet by  mouth daily with breakfast. 11/30/21   Kerrin Champagne, MD  metFORMIN (GLUCOPHAGE) 1000 MG tablet Take 1,000 mg by mouth at bedtime.    [provider]  methocarbamol (ROBAXIN) 500 MG tablet Take 500 mg by mouth every 8 (eight) hours as needed for muscle spasms. 03/27/21   [provider]  nitroGLYCERIN (NITROSTAT) 0.4 MG SL tablet Place 1 tablet (0.4 mg total) under the tongue every 5 (five) minutes as needed for chest pain. Dissolve one tablet under the tongue every 5 mintues as needed for chest pain. 07/26/21   Little Ishikawa, MD  ondansetron (ZOFRAN-ODT) 4 MG disintegrating tablet Take 1 tablet (4 mg total) by mouth every 6 (six) hours as needed for nausea. 02/01/22   Lucretia Roers, MD  pantoprazole (PROTONIX) 40 MG tablet Take 1 tablet (40 mg total) by mouth daily. 03/02/22 03/02/23  Letta Median, PA-C  polyethylene glycol (MIRALAX / GLYCOLAX) 17 g packet Take 17 g by mouth daily. 03/22/21   Emokpae, Courage, MD  RYBELSUS 14 MG TABS Take 14 mg by mouth daily. 03/06/21   [provider]  simethicone (MYLICON) 80 MG chewable tablet Chew 0.5 tablets (40 mg total) by mouth every 6 (six) hours as needed for flatulence (bloating). 01/21/22   Lucretia Roers, MD  SURE COMFORT INSULIN SYRINGE 31G X 5/16" 1 ML MISC  04/05/21   [provider]  traMADol (ULTRAM) 50 MG tablet Take 1 tablet (50 mg  total) by mouth every 6 (six) hours as needed for severe pain. 01/21/22   Lucretia Roers, MD      Allergies    Patient has no known allergies.    Review of Systems   Review of Systems  Physical Exam Updated Vital Signs BP 125/62   Pulse 73   Temp 97.7 F (36.5 C)   Resp 19   Ht 1.651 m (5\' 5" )   Wt 83.9 kg   SpO2 100%   BMI 30.79 kg/m  Physical Exam  ED Results / Procedures / Treatments   Labs (all labs ordered are listed, but only abnormal results are displayed) Labs Reviewed  CBC - Abnormal; Notable for the following components:      Result  Value   WBC 12.0 (*)    All other components within normal limits  D-DIMER, QUANTITATIVE  BASIC METABOLIC PANEL  TROPONIN I (HIGH SENSITIVITY)    EKG None  Radiology DG Chest Port 1 View  Result Date: 08/27/2022 CLINICAL DATA:  Chest pain EXAM: PORTABLE CHEST 1 VIEW COMPARISON:  Chest x-ray dated April 16, 2021 FINDINGS: The heart size and mediastinal contours are within normal limits. Unchanged elevation of the right hemidiaphragm. Both lungs are clear. The visualized skeletal structures are unremarkable. IMPRESSION: No active disease. Electronically Signed   By: April 18, 2021 M.D.   On: 08/27/2022 16:13    Procedures Procedures  {Document cardiac monitor, telemetry assessment procedure when appropriate:1}  Medications Ordered in ED Medications - No data to display  ED Course/ Medical Decision Making/ A&P                           Medical Decision Making Amount and/or Complexity of Data Reviewed Labs: ordered. Radiology: ordered.   ***  {Document critical care time when appropriate:1} {Document review of labs and clinical decision tools ie heart score, Chads2Vasc2 etc:1}  {Document your independent review of radiology images, and any outside records:1} {Document your discussion with family members, caretakers, and with consultants:1} {Document social determinants of health affecting pt's care:1} {Document your decision making why or why not admission, treatments were needed:1} Final Clinical Impression(s) / ED Diagnoses Final diagnoses:  None    Rx / DC Orders ED Discharge Orders     None

## 2022-08-27 NOTE — ED Triage Notes (Signed)
After  cleaning PTA Pt was having a BM and dell on bottom to floor - then began having CP. with dizziness, SOB, diaphoresis and upper left thigh pain.  Pt states she took a  [employer] @ home then called EMS.  Pt has 9/10 pain at this time - had 2 Nitro total - 1 with EMS.  BP dropped with EMS.

## 2022-08-27 NOTE — Discharge Instructions (Signed)
Call to set up an appointment for cardiology.  Also 7 appointment follow-up with your regular doctor.  Return for any recurrent chest pain or new or worsening chest pain.  Today's work-up without any significant findings.  X-rays of your back and of the left hip without any bony abnormalities.

## 2022-08-31 ENCOUNTER — Telehealth: Payer: Self-pay

## 2022-08-31 NOTE — Telephone Encounter (Signed)
        Patient  visited [facility] on 11/13     Telephone encounter attempt :  1st  A HIPAA compliant voice message was left requesting a return call.  Instructed patient to call back    Lenard Forth [facility] Guide, [facility], Care Management  [phone] [address] [place], [place], [place] [address] Phone: [phone] Email: [email or web address]

## 2022-09-03 ENCOUNTER — Telehealth: Payer: Self-pay

## 2022-09-03 NOTE — Telephone Encounter (Signed)
     Patient  visit on 11/14  at [facility]   Have you been able to follow up with your primary care physician? YES  The patient was or was not able to obtain any needed medicine or equipment. YES   Are there diet recommendations that you are having difficulty following? YES  Patient expresses understanding of discharge instructions and education provided has no other needs at this time. YES    [name] [facility] Guide, [facility], Care Management  [phone] [address] [place], [place], [place] [address] Phone: [phone] Email: [email or web address]

## 2022-09-13 DIAGNOSIS — E785 Hyperlipidemia, unspecified: Secondary | ICD-10-CM | POA: Diagnosis not present

## 2022-09-13 DIAGNOSIS — E1165 Type 2 diabetes mellitus with hyperglycemia: Secondary | ICD-10-CM | POA: Diagnosis not present

## 2022-09-13 DIAGNOSIS — I10 Essential (primary) hypertension: Secondary | ICD-10-CM | POA: Diagnosis not present

## 2022-09-17 DIAGNOSIS — N390 Urinary tract infection, site not specified: Secondary | ICD-10-CM | POA: Diagnosis not present

## 2022-09-17 DIAGNOSIS — I10 Essential (primary) hypertension: Secondary | ICD-10-CM | POA: Diagnosis not present

## 2022-09-17 DIAGNOSIS — E1169 Type 2 diabetes mellitus with other specified complication: Secondary | ICD-10-CM | POA: Diagnosis not present

## 2022-09-17 DIAGNOSIS — R42 Dizziness and giddiness: Secondary | ICD-10-CM | POA: Diagnosis not present

## 2022-09-17 DIAGNOSIS — R0789 Other chest pain: Secondary | ICD-10-CM | POA: Diagnosis not present

## 2022-09-17 DIAGNOSIS — E785 Hyperlipidemia, unspecified: Secondary | ICD-10-CM | POA: Diagnosis not present

## 2022-09-17 DIAGNOSIS — K432 Incisional hernia without obstruction or gangrene: Secondary | ICD-10-CM | POA: Diagnosis not present

## 2022-09-21 ENCOUNTER — Encounter: Payer: Self-pay | Admitting: Internal Medicine

## 2022-09-21 ENCOUNTER — Ambulatory Visit: Payer: Medicare Other | Attending: Internal Medicine | Admitting: Internal Medicine

## 2022-09-21 VITALS — BP 116/74 | HR 84 | Ht 65.0 in | Wt 181.0 lb

## 2022-09-21 DIAGNOSIS — I2585 Chronic coronary microvascular dysfunction: Secondary | ICD-10-CM

## 2022-09-21 MED ORDER — NITROGLYCERIN 0.4 MG SL SUBL: 0.4000 mg | SUBLINGUAL_TABLET | SUBLINGUAL | 1 refills | Status: AC | PRN

## 2022-09-21 MED ORDER — METOPROLOL TARTRATE 25 MG PO TABS: 25.0000 mg | ORAL_TABLET | Freq: Two times a day (BID) | ORAL | 3 refills | Status: DC

## 2022-09-21 NOTE — Progress Notes (Signed)
Cardiology Office Note  Date: 09/21/2022   ID: [name], DOB 08-26-1951, MRN [id number]  PCP:  Angela Beard, MD  Cardiologist:  Donato Heinz, MD Electrophysiologist:  None   Reason for Office Visit: Chest pain follow-up   History of Present Illness: [name] is a 71 y.o. female known to have mild carotid artery disease, HTN, HLD (followed by PCP), varicose veins with mild chronic edema presents for follow-up visit.  She underwent coronary CTA in 05/2019 showing no CAD (coronary calcium score of 0) and small secundum ASD (7.1 x 3.2 mm). She continues to have chest discomfort both at rest and exertion radiating to her right arm and sometimes to her right leg.  Initially the chest discomfort resolved with nitro to strain but it is not working anymore.  She also started to feel dizzy when her blood pressure was noted to be too low.  Otherwise denied any palpitations, LE swelling, syncope.  Past Medical History:  Diagnosis Date   Asthma    Bronchitis    Diabetes mellitus    Hyperlipidemia    Hypertension    Interatrial cardiac shunt    a. small secundum ASD vs pulmonary shunt.   Low back pain    Migraine    Mild carotid artery disease (HCC)    Osteoarthritis    Sciatica    Varicose veins    Vertigo     Past Surgical History:  Procedure Laterality Date   ABDOMINAL HYSTERECTOMY     BIOPSY  05/12/2019   Procedure: BIOPSY;  Surgeon: Danie Binder, MD;  [facility];  Service: Endoscopy;;   CARPAL TUNNEL RELEASE Bilateral    CESAREAN SECTION     ESOPHAGOGASTRODUODENOSCOPY (EGD) WITH PROPOFOL N/A 05/12/2019   Procedure: ESOPHAGOGASTRODUODENOSCOPY (EGD) WITH PROPOFOL;  Surgeon: Danie Binder, MD;  normal-appearing esophagus s/p empiric dilation, moderate gastritis due to ASA s/p biopsy, normal examined duodenum.  Biopsies with gastritis.    EXCISION OF MESH N/A 01/18/2022   Procedure: PARTIAL EXCISION OF PRIOR MESH;  Surgeon: Virl Cagey, MD;  Location: [facility] ORS;  Service: General;  Laterality: N/A;   INCISIONAL HERNIA REPAIR N/A 03/15/2021   Procedure: [place] WITH MESH;  Surgeon: Virl Cagey, MD;  Location: [facility] ORS;  Service: General;  Laterality: N/A;   [place] N/A 01/18/2022   Procedure: HERNIA REPAIR INCISIONAL W/ MESH;  Surgeon: Virl Cagey, MD;  Location: [facility] OR[facility];  Service: General;  Laterality: N/A;   INGUINAL HERNIA REPAIR Left    LAPAROTOMY N/A 03/15/2021   Procedure: EXPLORATORY LAPAROTOMY;  Surgeon: Virl Cagey, MD;  Location: [facility];  Service: General;  Laterality: N/A;   LUMBAR FUSION     LYSIS OF ADHESION N/A 03/15/2021   Procedure: LYSIS OF ADHESION;  Surgeon: Virl Cagey, MD;  Location: [facility];  Service: General;  Laterality: N/A;   POSTERIOR LUMBAR FUSION 4 LEVEL N/A 03/04/2018   Procedure: Right transforaminal lumbar interbody fusion L1-2, L2-3 Posterior fusion T10, T11, T12, L1, L2 with T 11, T12 pedicle screws, superior sublaminar hooks T10, local bone graft, allograft cancellous chips Vivigen;  Surgeon: Jessy Oto, MD;  Location: [facility];  Service: Orthopedics;  Laterality: N/A;   SAVORY DILATION N/A 05/12/2019   Procedure: SAVORY DILATION;  Surgeon: Danie Binder, MD;  [facility];  Service: Endoscopy;  Laterality: N/A;    Current Outpatient Medications  Medication Sig Dispense Refill   amLODipine (NORVASC) 10  MG tablet Take 10 mg by mouth daily.     AquaLance Lancets 30G MISC      aspirin EC 81 MG tablet Take 81 mg by mouth daily. Swallow whole.     atorvastatin (LIPITOR) 40 MG tablet Take 40 mg by mouth daily.     docusate sodium (COLACE) 100 MG capsule Take 100 mg by mouth daily as needed for mild constipation.     ezetimibe (ZETIA) 10 MG tablet Take 1 tablet (10 mg total) by mouth daily. 90 tablet 3   fluorometholone (FML) 0.1 % ophthalmic suspension Place 1 drop into both eyes daily as needed (irritation).     HUMULIN 70/30  (70-30) 100 UNIT/ML injection Inject 45 Units into the skin 2 (two) times daily with a meal. 10 mL 11   hydrochlorothiazide (HYDRODIURIL) 25 MG tablet Take 25 mg by mouth daily.     isosorbide mononitrate (IMDUR) 30 MG 24 hr tablet Take 30 mg by mouth daily.     losartan (COZAAR) 50 MG tablet Take 50 mg by mouth daily.  4   magnesium hydroxide (MILK OF MAGNESIA) 400 MG/5ML suspension Take 30 mLs by mouth daily as needed for mild constipation or moderate constipation. 355 mL 0   meclizine (ANTIVERT) 25 MG tablet Take 25 mg by mouth 3 (three) times daily as needed.     Meloxicam 15 MG TBDP Take 1 tablet by mouth daily with breakfast. 90 tablet 4   metFORMIN (GLUCOPHAGE) 1000 MG tablet Take 1,000 mg by mouth at bedtime.     methocarbamol (ROBAXIN) 500 MG tablet Take 500 mg by mouth every 8 (eight) hours as needed for muscle spasms.     ondansetron (ZOFRAN-ODT) 4 MG disintegrating tablet Take 1 tablet (4 mg total) by mouth every 6 (six) hours as needed for nausea. 20 tablet 0   pantoprazole (PROTONIX) 40 MG tablet Take 1 tablet (40 mg total) by mouth daily. 90 tablet 3   polyethylene glycol (MIRALAX / GLYCOLAX) 17 g packet Take 17 g by mouth daily. 30 each 1   RYBELSUS 14 MG TABS Take 14 mg by mouth daily.     simethicone (MYLICON) 80 MG chewable tablet Chew 0.5 tablets (40 mg total) by mouth every 6 (six) hours as needed for flatulence (bloating). 30 tablet 0   SURE COMFORT INSULIN SYRINGE 31G X 5/16" 1 ML MISC      traMADol (ULTRAM) 50 MG tablet Take 1 tablet (50 mg total) by mouth every 6 (six) hours as needed for severe pain. 30 tablet 0   dicyclomine (BENTYL) 10 MG capsule Take 1 capsule (10 mg total) by mouth 4 (four) times daily -  before meals and at bedtime. (Patient not taking: Reported on 08/27/2022) 30 capsule 1   nitroGLYCERIN (NITROSTAT) 0.4 MG SL tablet Place 1 tablet (0.4 mg total) under the tongue every 5 (five) minutes as needed for chest pain. Dissolve one tablet under the tongue  every 5 mintues as needed for chest pain. 25 tablet 1   No current facility-administered medications for this visit.   Allergies:  Patient has no known allergies.   Social History: The patient  reports that she has never smoked. She has never used smokeless tobacco. She reports that she does not drink alcohol and does not use drugs.   Family History: The patient's family history includes Breast cancer in her mother; Diabetes in her sister and sister; Heart failure in her sister and sister; Lung cancer in her mother; Stroke in her  father.   ROS:  Please see the history of present illness. Otherwise, complete review of systems is positive for none.  All other systems are reviewed and negative.   Physical Exam: VS:  BP 116/74   Pulse 84   Ht 5\' 5"  (1.651 m)   Wt 181 lb (82.1 kg)   SpO2 94%   BMI 30.12 kg/m , BMI Body mass index is 30.12 kg/m.  Wt Readings from Last 3 Encounters:  09/21/22 181 lb (82.1 kg)  08/27/22 185 lb (83.9 kg)  07/19/22 177 lb (80.3 kg)    General: Patient appears comfortable at rest. HEENT: Conjunctiva and lids normal, oropharynx clear with moist mucosa. Neck: Supple, no elevated JVP or carotid bruits, no thyromegaly. Lungs: Clear to auscultation, nonlabored breathing at rest. Cardiac: Regular rate and rhythm, no S3 or significant systolic murmur, no pericardial rub. Abdomen: Soft, nontender, no hepatomegaly, bowel sounds present, no guarding or rebound. Extremities: No pitting edema, distal pulses 2+. Skin: Warm and dry. Musculoskeletal: No kyphosis. Neuropsychiatric: Alert and oriented x3, affect grossly appropriate.  ECG: Normal sinus rhythm and no ST-T changes  Recent Labwork: 01/20/2022: Magnesium 1.9 08/27/2022: BUN 18; Creatinine, Ser 1.16; Hemoglobin 13.0; Platelets 244; Potassium 4.2; Sodium 138     Component Value Date/Time   CHOL 195 06/12/2022 1426   TRIG 281 (H) 06/12/2022 1426   HDL 31 (L) 06/12/2022 1426   CHOLHDL 6.3 06/12/2022 1426    VLDL 56 (H) 06/12/2022 1426   LDLCALC 108 (H) 06/12/2022 1426    Other Studies Reviewed Today:   Assessment and Plan: Patient is a 71 year old F known to have mild carotid artery disease, HTN, HLD (followed by PCP), varicose veins with mild chronic edema presents for follow-up visit.  # Chest pain -Patient has substernal chest discomfort radiating to her right arm and sometimes to her right leg, 2-3 times per week, occurs at both rest and exertion, lasting for a few minutes and resolves with rest. This sounds atypical to me.  CT cardiac in 2020 showed no evidence of CAD.  Coronary calcium score was 0.  Her chest discomfort resolved with NTG in the past but not anymore. Will obtain cardiac PET to rule out coronary microvascular dysfunction. If myocardial blood flow is within normal limits, she will be discharged from cardiology clinic. In the meantime, start metoprolol tartrate 25 mg twice daily and stop Imdur.  # Mild carotid stenosis (1 to 39% stenosis bilateral) -Continue aspirin 81 mg once daily and atorvastatin 40 mg nightly.  LDL goal less than 70.  HLD management per PCP.  # HTN, controlled -Patient had dizzy spells when her blood pressure was noted to be too low.  Will discontinue losartan and Imdur. Instructed her to check her blood pressures every day to make sure they are not more than 140 mmHg SBP.  Continue amlodipine 10 mg once daily and HCTZ 25 mg once daily.  I have spent a total of 33 minutes with patient reviewing chart, EKGs, labs and examining patient as well as establishing an assessment and plan that was discussed with the patient.  > 50% of time was spent in direct patient care.      Medication Adjustments/Labs and Tests Ordered: Current medicines are reviewed at length with the patient today.  Concerns regarding medicines are outlined above.   Tests Ordered: No orders of the defined types were placed in this encounter.   Medication Changes: Meds ordered this  encounter  Medications   nitroGLYCERIN (NITROSTAT) 0.4 MG  SL tablet    Sig: Place 1 tablet (0.4 mg total) under the tongue every 5 (five) minutes as needed for chest pain. Dissolve one tablet under the tongue every 5 mintues as needed for chest pain.    Dispense:  25 tablet    Refill:  1    Disposition:  Follow up  1 year  Signed, Maelie Chriswell Fidel Levy, MD, 09/21/2022 3:17 PM    [facility] [place] [address], [place], [place] [address]

## 2022-09-21 NOTE — Patient Instructions (Addendum)
Medication Instructions:  Your physician has recommended you make the following change in your medication:  -Discontinue Imdur -Discontinue Losartan -Start Metoprolol Tartrate 25 mg tablets twice daly   Labwork: None  Testing/Procedures: How to Prepare for Your Cardiac PET/CT Stress Test:  HOLD METOPROLOL THE DAY OF THE EXAM.   1. Please do not take these medications before your test:   Medications that may interfere with the cardiac pharmacological stress agent (ex. nitrates - including erectile dysfunction medications or beta-blockers) the day of the exam. Theophylline containing medications for 12 hours. Dipyridamole 48 hours prior to the test. Your remaining medications may be taken with water.  2. Nothing to eat or drink, except water, 3 hours prior to arrival time.   NO caffeine/decaffeinated products, or chocolate 12 hours prior to arrival.  3. NO perfume, cologne or lotion  4. Total time is 1 to 2 hours; you may want to bring reading material for the waiting time.  5. Please report to Admitting at the [facility] Main Entrance 60 minutes early for your test.  [address] [place], [place] [address]  Diabetic Preparation:  Hold oral medications. You may take NPH and Lantus insulin. Do not take Humalog or Humulin R (Regular Insulin) the day of your test. Check blood sugars prior to leaving the house. If able to eat breakfast prior to 3 hour fasting, you may take all medications, including your insulin, Do not worry if you miss your breakfast dose of insulin - start at your next meal.  IF YOU THINK YOU MAY BE PREGNANT, OR ARE NURSING PLEASE INFORM THE TECHNOLOGIST.  In preparation for your appointment, medication and supplies will be purchased.  Appointment availability is limited, so if you need to cancel or reschedule, please call the Radiology Department at [phone]  24 hours in advance to avoid a cancellation fee of $100.00  What to Expect After  you Arrive:  Once you arrive and check in for your appointment, you will be taken to a preparation room within the Radiology Department.  A technologist or Nurse will obtain your medical history, verify that you are correctly prepped for the exam, and explain the procedure.  Afterwards,  an IV will be started in your arm and electrodes will be placed on your skin for EKG monitoring during the stress portion of the exam. Then you will be escorted to the PET/CT scanner.  There, staff will get you positioned on the scanner and obtain a blood pressure and EKG.  During the exam, you will continue to be connected to the EKG and blood pressure machines.  A small, safe amount of a radioactive tracer will be injected in your IV to obtain a series of pictures of your heart along with an injection of a stress agent.    After your Exam:  It is recommended that you eat a meal and drink a caffeinated beverage to counter act any effects of the stress agent.  Drink plenty of fluids for the remainder of the day and urinate frequently for the first couple of hours after the exam.  Your doctor will inform you of your test results within 7-10 business days.  For questions about your test or how to prepare for your test, please call: Rockwell Alexandria, Cardiac Imaging Nurse Navigator  Larey Brick, Cardiac Imaging Nurse Navigator Office: [phone]   Follow-Up: Follow up with Dr. Jenene Slicker in 1 year.   Any Other Special Instructions Will Be Listed Below (If Applicable).  If you need a refill on your cardiac medications before your next appointment, please call your pharmacy.

## 2022-10-10 IMAGING — CT CT ABD-PELV W/O CM
2 of 4 series · 16 of 46 positions shown, 18 images · non-contrast
Comparison: November 25, 2020

CLINICAL DATA: Abdominal pain, nausea vomiting.

EXAM:
CT ABDOMEN AND PELVIS WITHOUT CONTRAST
TECHNIQUE: Multidetector CT imaging of the abdomen and pelvis was performed
following the standard protocol without IV contrast.

[Series 2: axial (person_name) (person_name) · axial · 0.90mm/px · z∈[-688,-263]mm · 13 of 99 slices shown, 15 images]
[im 7/99  soft-tissue]
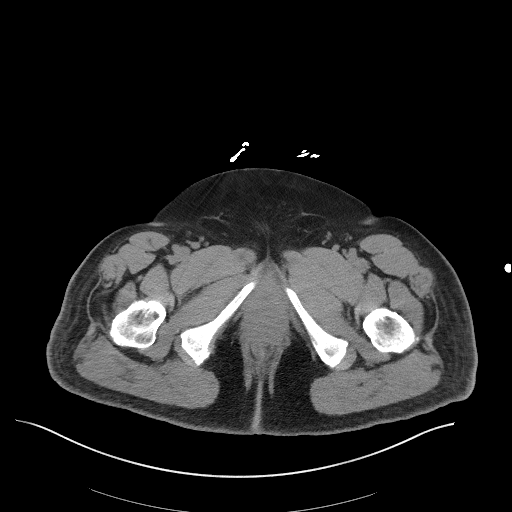
[im 7/99  bone]
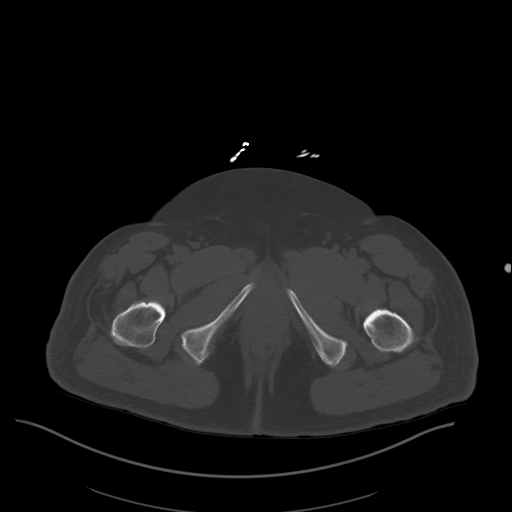
[im 13/99  soft-tissue]
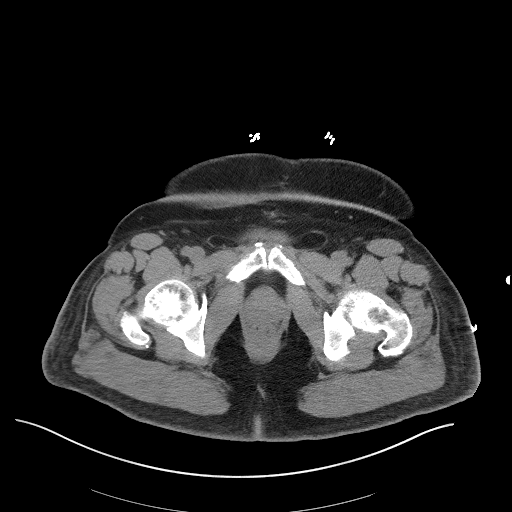
[im 19/99  soft-tissue]
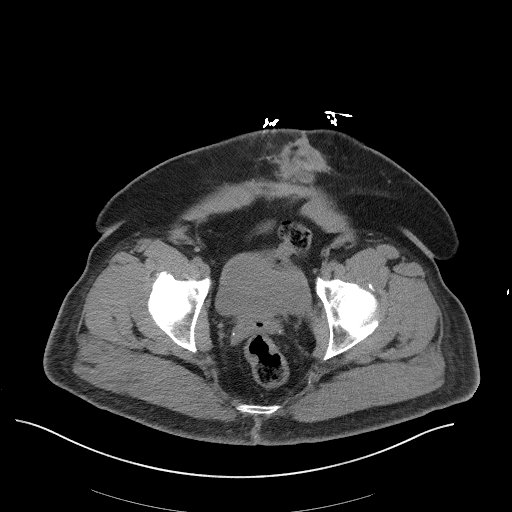
[im 31/99  soft-tissue]
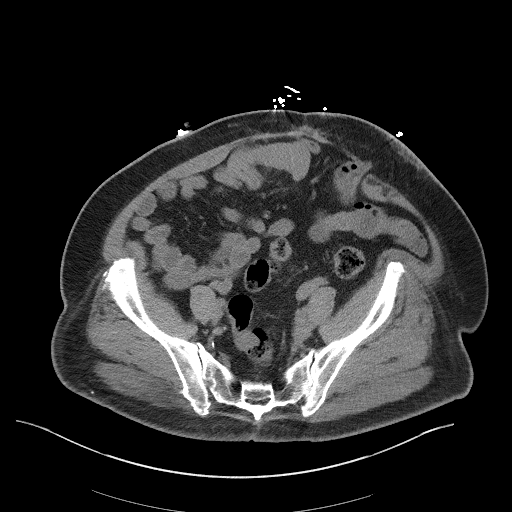
[im 37/99  soft-tissue]
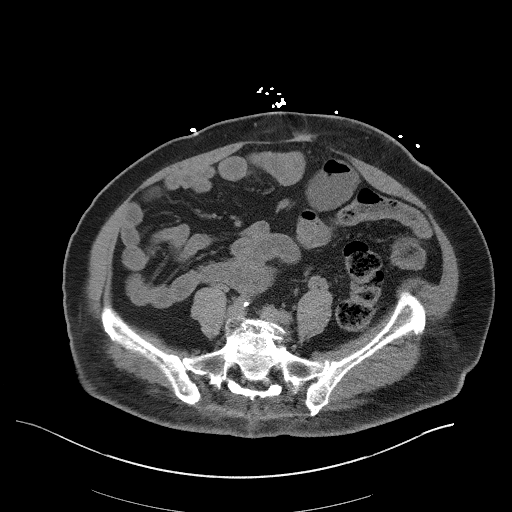
[im 43/99  soft-tissue]
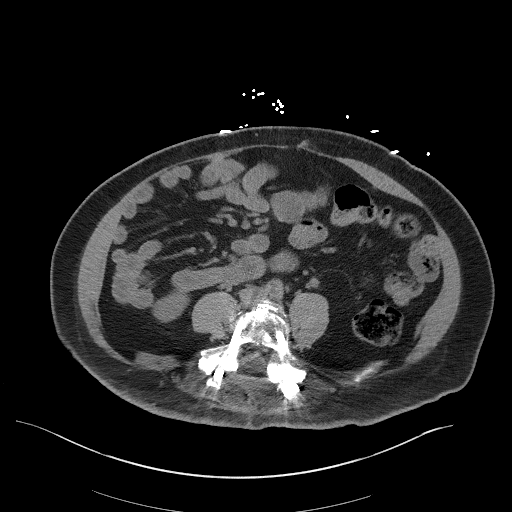
[im 50/99  soft-tissue]
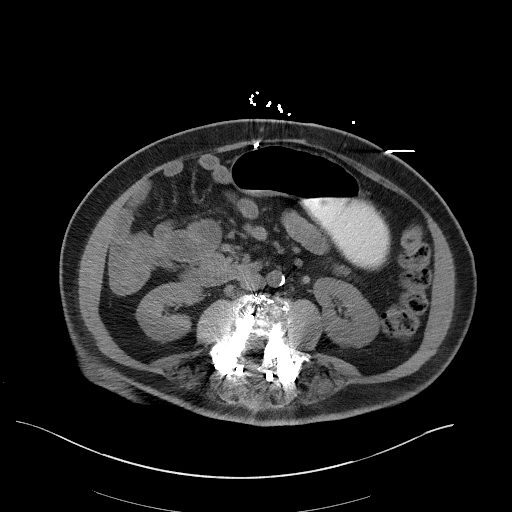
[im 56/99  soft-tissue]
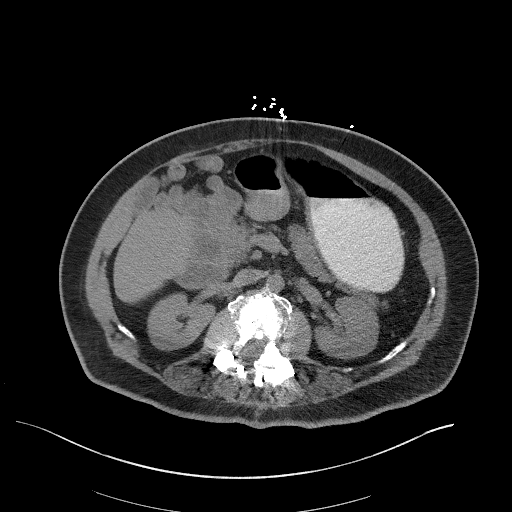
[im 62/99  soft-tissue]
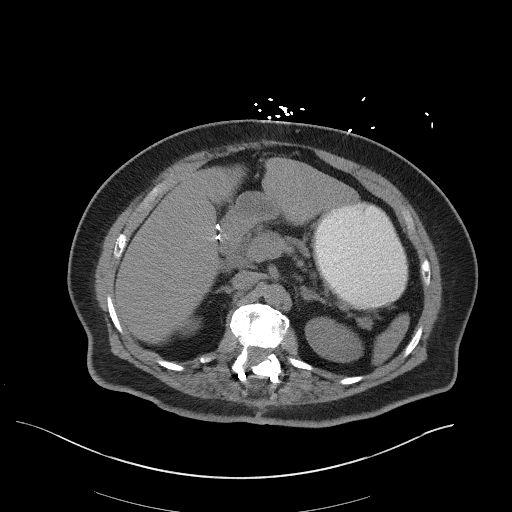
[im 62/99  bone]
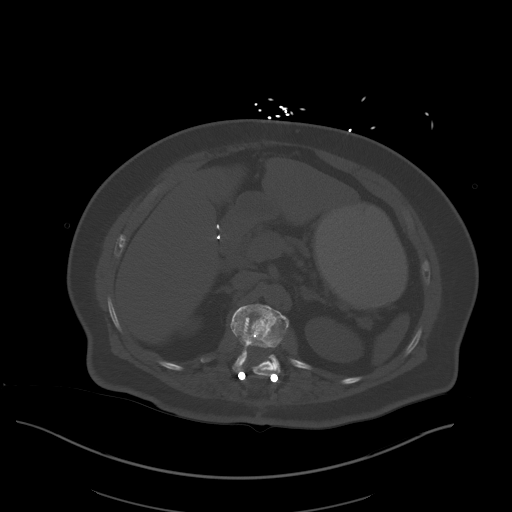
[im 68/99  soft-tissue]
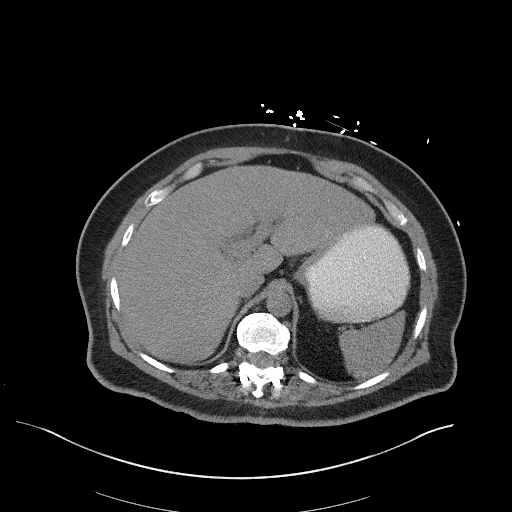
[im 80/99  soft-tissue]
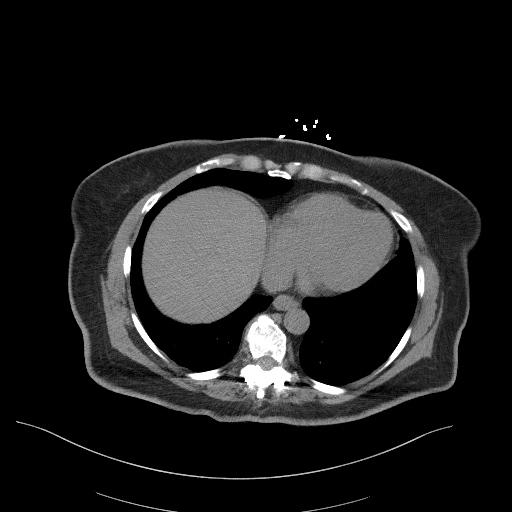
[im 86/99  soft-tissue]
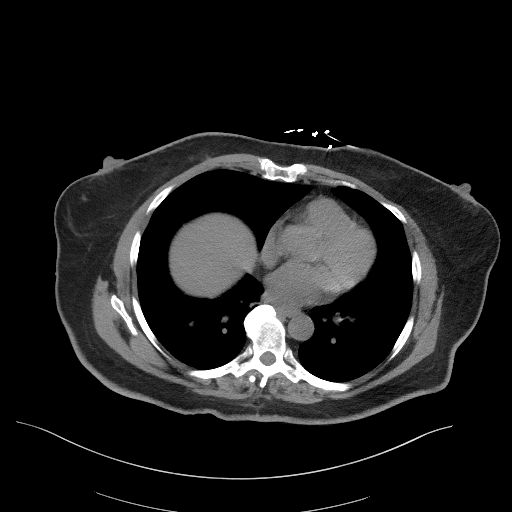
[im 92/99  soft-tissue]
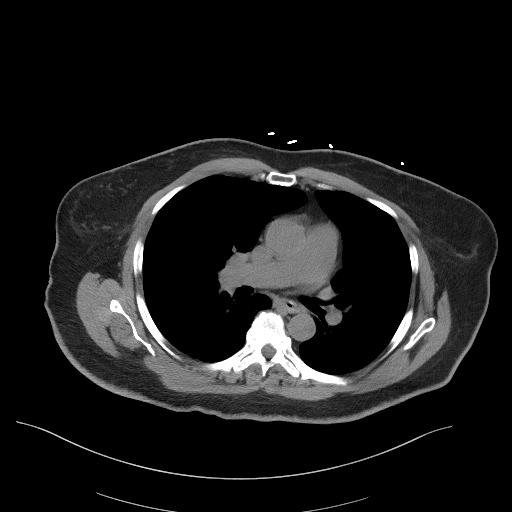

[Series 5: coronal st · coronal · 0.93mm/px · 3 of 120 slices shown]
[im 40/120  soft-tissue]
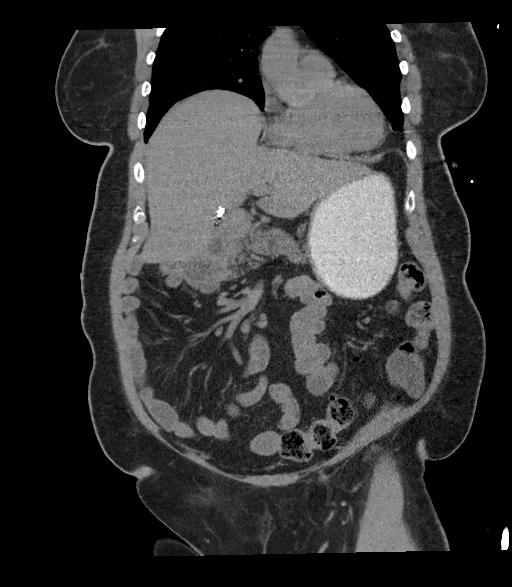
[im 53/120  soft-tissue]
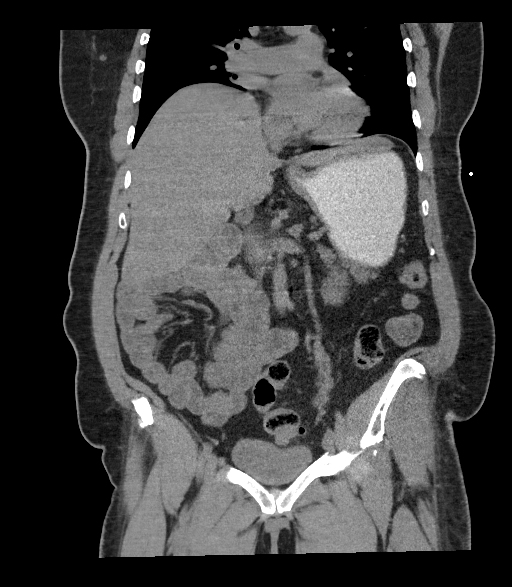
[im 67/120  soft-tissue]
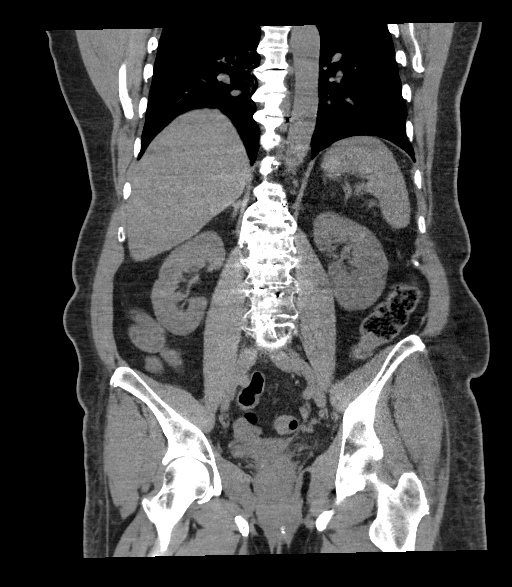

[16 of 46 positions shown; findings below may reference images not displayed]

FINDINGS: Lower chest: Right middle lobe atelectasis.

Hepatobiliary: No focal liver abnormality is seen. Status post
cholecystectomy. No biliary dilatation.

Pancreas: Unremarkable. No pancreatic ductal dilatation or
surrounding inflammatory changes.

Spleen: Normal in size without focal abnormality.

Adrenals/Urinary Tract: Adrenal glands are unremarkable. Kidneys are
normal, without renal calculi, focal lesion, or hydronephrosis.
Bladder is unremarkable.

Stomach/Bowel: The stomach appears normal. There is known
malrotation of the small bowel. No evidence of small-bowel
obstruction. There is diastasis safe the anterior abdominal wall.
There is also an anterior abdominal wall periumbilical hernia which
contains short segments of colon and small bowel. There is no
definite evidence of incarceration.

Vascular/Lymphatic: Aortic atherosclerosis. No enlarged abdominal or
pelvic lymph nodes.

Reproductive: Status post hysterectomy. No adnexal masses.

Other: No abdominopelvic ascites.

Musculoskeletal: Prior spinal fusion.  No acute osseous findings.
IMPRESSION: 1. Anterior abdominal wall periumbilical hernia which contains short
segments of colon and small bowel. No definite evidence of
incarceration.
2. Known malrotation of the small bowel. No evidence of small-bowel
obstruction.
3. No evidence of acute abnormalities within the solid abdominal
organs.

Aortic Atherosclerosis (VNO67-E25.5).

## 2022-11-13 DIAGNOSIS — E785 Hyperlipidemia, unspecified: Secondary | ICD-10-CM | POA: Diagnosis not present

## 2022-11-13 DIAGNOSIS — E1165 Type 2 diabetes mellitus with hyperglycemia: Secondary | ICD-10-CM | POA: Diagnosis not present

## 2022-11-13 DIAGNOSIS — I10 Essential (primary) hypertension: Secondary | ICD-10-CM | POA: Diagnosis not present

## 2022-12-13 DIAGNOSIS — E1165 Type 2 diabetes mellitus with hyperglycemia: Secondary | ICD-10-CM | POA: Diagnosis not present

## 2022-12-13 DIAGNOSIS — E785 Hyperlipidemia, unspecified: Secondary | ICD-10-CM | POA: Diagnosis not present

## 2022-12-13 DIAGNOSIS — I10 Essential (primary) hypertension: Secondary | ICD-10-CM | POA: Diagnosis not present

## 2022-12-17 DIAGNOSIS — I10 Essential (primary) hypertension: Secondary | ICD-10-CM | POA: Diagnosis not present

## 2022-12-17 DIAGNOSIS — E1169 Type 2 diabetes mellitus with other specified complication: Secondary | ICD-10-CM | POA: Diagnosis not present

## 2022-12-17 DIAGNOSIS — K432 Incisional hernia without obstruction or gangrene: Secondary | ICD-10-CM | POA: Diagnosis not present

## 2022-12-17 DIAGNOSIS — E785 Hyperlipidemia, unspecified: Secondary | ICD-10-CM | POA: Diagnosis not present

## 2022-12-17 DIAGNOSIS — E7800 Pure hypercholesterolemia, unspecified: Secondary | ICD-10-CM | POA: Diagnosis not present

## 2023-01-13 DIAGNOSIS — E1165 Type 2 diabetes mellitus with hyperglycemia: Secondary | ICD-10-CM | POA: Diagnosis not present

## 2023-01-13 DIAGNOSIS — E785 Hyperlipidemia, unspecified: Secondary | ICD-10-CM | POA: Diagnosis not present

## 2023-01-13 DIAGNOSIS — I10 Essential (primary) hypertension: Secondary | ICD-10-CM | POA: Diagnosis not present

## 2023-01-13 DIAGNOSIS — Z794 Long term (current) use of insulin: Secondary | ICD-10-CM | POA: Diagnosis not present

## 2023-01-21 DIAGNOSIS — E1169 Type 2 diabetes mellitus with other specified complication: Secondary | ICD-10-CM | POA: Diagnosis not present

## 2023-01-21 DIAGNOSIS — J069 Acute upper respiratory infection, unspecified: Secondary | ICD-10-CM | POA: Diagnosis not present

## 2023-01-21 DIAGNOSIS — N390 Urinary tract infection, site not specified: Secondary | ICD-10-CM | POA: Diagnosis not present

## 2023-01-24 ENCOUNTER — Ambulatory Visit: Payer: Medicare Other | Admitting: Orthopedic Surgery

## 2023-01-31 ENCOUNTER — Ambulatory Visit: Payer: Medicare Other | Admitting: Orthopedic Surgery

## 2023-02-04 DIAGNOSIS — E1169 Type 2 diabetes mellitus with other specified complication: Secondary | ICD-10-CM | POA: Diagnosis not present

## 2023-02-04 DIAGNOSIS — J301 Allergic rhinitis due to pollen: Secondary | ICD-10-CM | POA: Diagnosis not present

## 2023-02-04 DIAGNOSIS — J219 Acute bronchiolitis, unspecified: Secondary | ICD-10-CM | POA: Diagnosis not present

## 2023-02-12 DIAGNOSIS — E1142 Type 2 diabetes mellitus with diabetic polyneuropathy: Secondary | ICD-10-CM | POA: Diagnosis not present

## 2023-02-12 DIAGNOSIS — I10 Essential (primary) hypertension: Secondary | ICD-10-CM | POA: Diagnosis not present

## 2023-02-12 DIAGNOSIS — E785 Hyperlipidemia, unspecified: Secondary | ICD-10-CM | POA: Diagnosis not present

## 2023-02-18 ENCOUNTER — Encounter: Payer: Self-pay | Admitting: Gastroenterology

## 2023-02-26 ENCOUNTER — Ambulatory Visit: Payer: Medicare Other | Admitting: General Surgery

## 2023-02-26 ENCOUNTER — Encounter: Payer: Self-pay | Admitting: General Surgery

## 2023-02-26 VITALS — BP 111/65 | HR 71 | Temp 98.5°F | Resp 12 | Ht 65.0 in | Wt 176.0 lb

## 2023-02-26 DIAGNOSIS — K432 Incisional hernia without obstruction or gangrene: Secondary | ICD-10-CM

## 2023-02-26 NOTE — Patient Instructions (Addendum)
CT ordered to further assess the area. Unsure if recurrence of if the abdominal wall is just lax and bulging. Prior imaging has a very lax area in the abdomen. Will call with results.

## 2023-02-26 NOTE — Progress Notes (Signed)
[facility] History and Physical   Chief Complaint   Hernia     [name] is a 72 y.o. female.  HPI: [name] returns from her PCP with concern that her hernia has recurred. She had a ventral hernia repair with mesh 2023 and she is having no pain or issues with obstruction. She was sent to be evaluated for possible recurrence.   Past Medical History:  Diagnosis Date   Asthma    Bronchitis    Diabetes mellitus    Hyperlipidemia    Hypertension    Interatrial cardiac shunt    a. small secundum ASD vs pulmonary shunt.   Low back pain    Migraine    Mild carotid artery disease (HCC)    Osteoarthritis    Sciatica    Varicose veins    Vertigo     Past Surgical History:  Procedure Laterality Date   ABDOMINAL HYSTERECTOMY     BIOPSY  05/12/2019   Procedure: BIOPSY;  Surgeon: West Bali, MD;  [facility];  Service: Endoscopy;;   CARPAL TUNNEL RELEASE Bilateral    CESAREAN SECTION     ESOPHAGOGASTRODUODENOSCOPY (EGD) WITH PROPOFOL N/A 05/12/2019   Procedure: ESOPHAGOGASTRODUODENOSCOPY (EGD) WITH PROPOFOL;  Surgeon: West Bali, MD;  normal-appearing esophagus s/p empiric dilation, moderate gastritis due to ASA s/p biopsy, normal examined duodenum.  Biopsies with gastritis.    EXCISION OF MESH N/A 01/18/2022   Procedure: PARTIAL EXCISION OF PRIOR MESH;  Surgeon: Lucretia Roers, MD;  Location: [facility];  Service: General;  Laterality: N/A;   INCISIONAL HERNIA REPAIR N/A 03/15/2021   Procedure: Linus Mako WITH MESH;  Surgeon: Lucretia Roers, MD;  Location: [facility];  Service: General;  Laterality: N/A;   INCISIONAL HERNIA REPAIR N/A 01/18/2022   Procedure: HERNIA REPAIR INCISIONAL W/ MESH;  Surgeon: Lucretia Roers, MD;  Location: [facility];  Service: General;  Laterality: N/A;   INGUINAL HERNIA REPAIR Left    LAPAROTOMY N/A 03/15/2021   Procedure: EXPLORATORY LAPAROTOMY;  Surgeon: Lucretia Roers, MD;  Location:  [facility];  Service: General;  Laterality: N/A;   LUMBAR FUSION     LYSIS OF ADHESION N/A 03/15/2021   Procedure: LYSIS OF ADHESION;  Surgeon: Lucretia Roers, MD;  Location: [facility];  Service: General;  Laterality: N/A;   POSTERIOR LUMBAR FUSION 4 LEVEL N/A 03/04/2018   Procedure: Right transforaminal lumbar interbody fusion L1-2, L2-3 Posterior fusion T10, T11, T12, L1, L2 with T 11, T12 pedicle screws, superior sublaminar hooks T10, local bone graft, allograft cancellous chips Vivigen;  Surgeon: Kerrin Champagne, MD;  Location: [facility];  Service: Orthopedics;  Laterality: N/A;   SAVORY DILATION N/A 05/12/2019   Procedure: SAVORY DILATION;  Surgeon: West Bali, MD;  [facility];  Service: Endoscopy;  Laterality: N/A;    Family History  Problem Relation Age of Onset   Lung cancer Mother    Breast cancer Mother    Stroke Father    Heart failure Sister    Diabetes Sister    Heart failure Sister    Diabetes Sister    Colon cancer Neg Hx    Gastric cancer Neg Hx    Esophageal cancer Neg Hx     Social History   Tobacco Use   Smoking status: Never   Smokeless tobacco: Never  Vaping Use   Vaping Use: Never used  Substance Use Topics   Alcohol use: No  Alcohol/week: 0.0 standard drinks of alcohol   Drug use: No    Medications: I have reviewed the patient's current medications. Allergies as of 02/26/2023   No Known Allergies      Medication List        Accurate as of Feb 26, 2023 10:40 AM. If you have any questions, ask your nurse or doctor.          amLODipine 10 MG tablet Commonly known as: NORVASC Take 10 mg by mouth daily.   AquaLance Lancets 30G Misc   aspirin EC 81 MG tablet Take 81 mg by mouth daily. Swallow whole.   atorvastatin 40 MG tablet Commonly known as: LIPITOR Take 40 mg by mouth daily.   dicyclomine 10 MG capsule Commonly known as: BENTYL Take 1 capsule (10 mg total) by mouth 4 (four) times daily -  before meals and at bedtime.    docusate sodium 100 MG capsule Commonly known as: COLACE Take 100 mg by mouth daily as needed for mild constipation.   ezetimibe 10 MG tablet Commonly known as: ZETIA Take 1 tablet (10 mg total) by mouth daily.   fluorometholone 0.1 % ophthalmic suspension Commonly known as: FML Place 1 drop into both eyes daily as needed (irritation).   HumuLIN 70/30 (70-30) 100 UNIT/ML injection Generic drug: insulin NPH-regular Human Inject 45 Units into the skin 2 (two) times daily with a meal.   hydrochlorothiazide 25 MG tablet Commonly known as: HYDRODIURIL Take 25 mg by mouth daily.   magnesium hydroxide 400 MG/5ML suspension Commonly known as: MILK OF MAGNESIA Take 30 mLs by mouth daily as needed for mild constipation or moderate constipation.   meclizine 25 MG tablet Commonly known as: ANTIVERT Take 25 mg by mouth 3 (three) times daily as needed.   Meloxicam 15 MG Tbdp Take 1 tablet by mouth daily with breakfast.   metFORMIN 1000 MG tablet Commonly known as: GLUCOPHAGE Take 1,000 mg by mouth at bedtime.   methocarbamol 500 MG tablet Commonly known as: ROBAXIN Take 500 mg by mouth every 8 (eight) hours as needed for muscle spasms.   metoprolol tartrate 25 MG tablet Commonly known as: LOPRESSOR Take 1 tablet (25 mg total) by mouth 2 (two) times daily.   nitroGLYCERIN 0.4 MG SL tablet Commonly known as: NITROSTAT Place 1 tablet (0.4 mg total) under the tongue every 5 (five) minutes as needed for chest pain. Dissolve one tablet under the tongue every 5 mintues as needed for chest pain.   ondansetron 4 MG disintegrating tablet Commonly known as: ZOFRAN-ODT Take 1 tablet (4 mg total) by mouth every 6 (six) hours as needed for nausea.   pantoprazole 40 MG tablet Commonly known as: Protonix Take 1 tablet (40 mg total) by mouth daily.   polyethylene glycol 17 g packet Commonly known as: MIRALAX / GLYCOLAX Take 17 g by mouth daily.   Rybelsus 14 MG Tabs Generic drug:  Semaglutide Take 14 mg by mouth daily.   simethicone 80 MG chewable tablet Commonly known as: MYLICON Chew 0.5 tablets (40 mg total) by mouth every 6 (six) hours as needed for flatulence (bloating).   Sure Comfort Insulin Syringe 31G X 5/16" 1 ML Misc Generic drug: Insulin Syringe-Needle U-100   traMADol 50 MG tablet Commonly known as: ULTRAM Take 1 tablet (50 mg total) by mouth every 6 (six) hours as needed for severe pain.         ROS:  A comprehensive review of systems was negative except for: Gastrointestinal: positive  for possible hernia recurrence   Blood pressure 111/65, pulse 71, temperature 98.5 F (36.9 C), temperature source Oral, resp. rate 12, height 5\' 5"  (1.651 m), weight 176 lb (79.8 kg), SpO2 90 %. Physical Exam HENT:     Head: Normocephalic.     Nose: Nose normal.  Eyes:     Extraocular Movements: Extraocular movements intact.  Cardiovascular:     Rate and Rhythm: Normal rate.  Pulmonary:     Effort: Pulmonary effort is normal.  Abdominal:     General: There is no distension.     Palpations: Abdomen is soft.     Tenderness: There is no abdominal tenderness.     Comments: Laxity of the abdominal wall, difficult to say if any right sided recurrence   Musculoskeletal:        General: Normal range of motion.  Skin:    General: Skin is warm.  Neurological:     General: No focal deficit present.     Mental Status: She is alert and oriented to person, place, and time.  Psychiatric:        Mood and Affect: Mood normal.        Behavior: Behavior normal.     Results: reviewed prior CT from 10/2021 and lax abdominal wall mesh placed since then 10X15 in size    Assessment & Plan:  [name] is a 72 y.o. female with possible recurrence. Difficult to say on abdominal exam. She has no pain or obstructive symptoms.   CT ordered to further assess the area. Unsure if recurrence of if the abdominal wall is just lax and bulging. Prior imaging has a  very lax area in the abdomen. Will call with results.   All questions were answered to the satisfaction of the patient.    Lucretia Roers 02/26/2023, 10:40 AM

## 2023-02-28 ENCOUNTER — Ambulatory Visit: Payer: Medicare Other | Admitting: Orthopedic Surgery

## 2023-03-18 DIAGNOSIS — R42 Dizziness and giddiness: Secondary | ICD-10-CM | POA: Diagnosis not present

## 2023-03-18 DIAGNOSIS — F4381 Prolonged grief disorder: Secondary | ICD-10-CM | POA: Diagnosis not present

## 2023-03-18 DIAGNOSIS — R051 Acute cough: Secondary | ICD-10-CM | POA: Diagnosis not present

## 2023-03-21 ENCOUNTER — Other Ambulatory Visit (INDEPENDENT_AMBULATORY_CARE_PROVIDER_SITE_OTHER): Payer: Medicare Other

## 2023-03-21 ENCOUNTER — Ambulatory Visit: Payer: Medicare Other | Admitting: Orthopedic Surgery

## 2023-03-21 VITALS — BP 123/72 | HR 73

## 2023-03-21 DIAGNOSIS — Z981 Arthrodesis status: Secondary | ICD-10-CM

## 2023-03-21 MED ORDER — ACETAMINOPHEN 500 MG PO TABS: 1000.0000 mg | ORAL_TABLET | Freq: Three times a day (TID) | ORAL | 1 refills | Status: AC | PRN

## 2023-03-21 MED ORDER — GABAPENTIN 300 MG PO CAPS: 300.0000 mg | ORAL_CAPSULE | Freq: Three times a day (TID) | ORAL | 2 refills | Status: DC

## 2023-03-21 NOTE — Progress Notes (Signed)
Orthopedic Spine Surgery Office Note  Assessment: Patient is a 72 y.o. female s/p T10-S1 fusion with Dr. Otelia Sergeant who is doing well after surgery   Plan: -Patient has tried mobic  -Activity as tolerated, no spine specific precautions -Recommended tylenol 1000mg  TID and gabapentin. Gabapentin prescribed today -Patient should return to office in 1 year, x-rays at next visit: none   Patient expressed understanding of the plan and all questions were answered to the patient's satisfaction.   ___________________________________________________________________________   History:  Patient is a 72 y.o. female who presents today for lumbar spine. Patient had undergone several lumbar surgeries with Dr. Otelia Sergeant. Her current construct is from T10-S1. She has been doing well since she was last seen by Dr. Otelia Sergeant. She does have chronic low back pain. She has some lateral right thigh pain but that is not as significant as her back pain. No pain radiating past the knee. No left leg symptoms. Pain had been tolerable with mobic, but it has gotten a little worse recently.    Weakness: denies Symptoms of imbalance: denies Paresthesias and numbness: denies Bowel or bladder incontinence: denies Saddle anesthesia: denies  Treatments tried: mobic  Review of systems: Denies fevers and chills, night sweats, unexplained weight loss, history of cancer. Has had pain that wakes her at night  Past medical history: HLD HTN Diabetes (last A1C was 7.9 on 01/16/2022)  Allergies: NKDA  Past surgical history:  T10-S1 PSIF Hysterectomy Carpal tunnel release C section Hernia repair Lysis of adhesions Savory dilation  Social history: Denies use of nicotine product (smoking, vaping, patches, smokeless) Alcohol use: denies Denies recreational drug use   Physical Exam:  General: no acute distress, appears stated age Neurologic: alert, answering questions appropriately, following commands Respiratory:  unlabored breathing on room air, symmetric chest rise Psychiatric: appropriate affect, normal cadence to speech   MSK (spine):  -Strength exam      Left  Right EHL    5/5  5/5 TA    5/5  5/5 GSC    5/5  5/5 Knee extension  5/5  5/5 Hip flexion   5/5  5/5  -Sensory exam    Sensation intact to light touch in L3-S1 nerve distributions of bilateral lower extremities  -Straight leg raise: negative bilaterally -Clonus: no beats bilaterally  -Left hip exam: no pain through range of motion -Right hip exam: no pain through range of motion  Imaging: XR of the lumbar spine from 03/21/2023 was independently reviewed and interpreted, showing T10-S1 posterior instrumentation. No lucency around the screws. Screws have not backed out. There are side to side connectors between the lower lumbar rods and the more cranial rods. There are interbody devices in all the lumbar disc spaces. Interbody devices appear in expected position. No fracture or dislocation seen.    Patient name: [name] Patient MRN: [id number] Date of visit: 03/21/23

## 2023-03-22 ENCOUNTER — Other Ambulatory Visit: Payer: Self-pay | Admitting: Cardiology

## 2023-03-29 ENCOUNTER — Ambulatory Visit: Payer: Medicare Other | Admitting: Internal Medicine

## 2023-04-19 ENCOUNTER — Ambulatory Visit: Payer: Medicare Other | Admitting: Internal Medicine

## 2023-04-25 ENCOUNTER — Ambulatory Visit (HOSPITAL_COMMUNITY): Payer: Medicare Other

## 2023-05-08 ENCOUNTER — Other Ambulatory Visit: Payer: Self-pay | Admitting: Cardiology

## 2023-05-08 ENCOUNTER — Other Ambulatory Visit: Payer: Self-pay | Admitting: Gastroenterology

## 2023-05-08 DIAGNOSIS — K219 Gastro-esophageal reflux disease without esophagitis: Secondary | ICD-10-CM

## 2023-05-09 NOTE — Telephone Encounter (Signed)
This is a [place] pt.  °

## 2023-05-09 NOTE — Telephone Encounter (Signed)
Refilling medication. Recommend OV for routine follow-up within the next 3-4 months. Please arrange.

## 2023-05-31 ENCOUNTER — Ambulatory Visit: Payer: Medicare Other | Attending: Internal Medicine | Admitting: Internal Medicine

## 2023-05-31 ENCOUNTER — Encounter: Payer: Self-pay | Admitting: Internal Medicine

## 2023-05-31 VITALS — BP 122/58 | HR 66 | Ht 65.0 in | Wt 180.0 lb

## 2023-05-31 DIAGNOSIS — I10 Essential (primary) hypertension: Secondary | ICD-10-CM | POA: Diagnosis not present

## 2023-05-31 NOTE — Progress Notes (Signed)
Cardiology Office Note  Date: 05/31/2023   ID: [name], DOB 1951-09-10, MRN [id number]  PCP:  Mirna Mires, MD  Cardiologist:  Marjo Bicker, MD Electrophysiologist:  None    History of Present Illness: [name] is a 72 y.o. female known to have mild carotid artery disease, HTN, HLD (followed by PCP), varicose veins with mild chronic edema presents for follow-up visit of chest pain.  She underwent coronary CTA in 05/2019 showing no CAD (coronary calcium score of 0) and small secundum ASD (7.1 x 3.2 mm).  She was having persistent chest pains both with rest and exertion radiating to her right arm and right leg. She is here for follow-up visit.  She recently had spine surgery and was told that she will have right leg pain shooting to her back which she has currently.  She also has right arm pain probably from arthritis is what she thinks.  She does not get chest pains with this but her chest gets tight after she walks for 2 blocks and the chest tightness stays for 2 days.  She also noticed that when she takes a nitroglycerin pill, the chest tightness resolves but she also noticed that she was able to swallow better.  She does have difficulty swallowing foods.  She had prior history of esophageal dilatation previously.  She also reported having positional dizziness, especially when she stands up and also when she gets out of her bed in the morning.  She does not have any dizziness with exertion.  No syncope.  No DOE, orthopnea, PND.   Past Medical History:  Diagnosis Date   Asthma    Bronchitis    Diabetes mellitus    Hyperlipidemia    Hypertension    Interatrial cardiac shunt    a. small secundum ASD vs pulmonary shunt.   Low back pain    Migraine    Mild carotid artery disease (HCC)    Osteoarthritis    Sciatica    Varicose veins    Vertigo     Past Surgical History:  Procedure Laterality Date   ABDOMINAL HYSTERECTOMY     BIOPSY  05/12/2019    Procedure: BIOPSY;  Surgeon: West Bali, MD;  [facility];  Service: Endoscopy;;   CARPAL TUNNEL RELEASE Bilateral    CESAREAN SECTION     ESOPHAGOGASTRODUODENOSCOPY (EGD) WITH PROPOFOL N/A 05/12/2019   Procedure: ESOPHAGOGASTRODUODENOSCOPY (EGD) WITH PROPOFOL;  Surgeon: West Bali, MD;  normal-appearing esophagus s/p empiric dilation, moderate gastritis due to ASA s/p biopsy, normal examined duodenum.  Biopsies with gastritis.    EXCISION OF MESH N/A 01/18/2022   Procedure: PARTIAL EXCISION OF PRIOR MESH;  Surgeon: Lucretia Roers, MD;  Location: [facility];  Service: General;  Laterality: N/A;   INCISIONAL HERNIA REPAIR N/A 03/15/2021   Procedure: Linus Mako WITH MESH;  Surgeon: Lucretia Roers, MD;  Location: [facility];  Service: General;  Laterality: N/A;   INCISIONAL HERNIA REPAIR N/A 01/18/2022   Procedure: HERNIA REPAIR INCISIONAL W/ MESH;  Surgeon: Lucretia Roers, MD;  Location: [facility];  Service: General;  Laterality: N/A;   INGUINAL HERNIA REPAIR Left    LAPAROTOMY N/A 03/15/2021   Procedure: EXPLORATORY LAPAROTOMY;  Surgeon: Lucretia Roers, MD;  Location: [facility];  Service: General;  Laterality: N/A;   LUMBAR FUSION     LYSIS OF ADHESION N/A 03/15/2021   Procedure: LYSIS OF ADHESION;  Surgeon: Lucretia Roers, MD;  Location: [facility];  Service: General;  Laterality: N/A;   POSTERIOR LUMBAR FUSION 4 LEVEL N/A 03/04/2018   Procedure: Right transforaminal lumbar interbody fusion L1-2, L2-3 Posterior fusion T10, T11, T12, L1, L2 with T 11, T12 pedicle screws, superior sublaminar hooks T10, local bone graft, allograft cancellous chips Vivigen;  Surgeon: Kerrin Champagne, MD;  Location: [facility];  Service: Orthopedics;  Laterality: N/A;   SAVORY DILATION N/A 05/12/2019   Procedure: SAVORY DILATION;  Surgeon: West Bali, MD;  [facility];  Service: Endoscopy;  Laterality: N/A;    Current Outpatient Medications  Medication Sig Dispense Refill    acetaminophen (TYLENOL) 500 MG tablet Take 2 tablets (1,000 mg total) by mouth every 8 (eight) hours as needed for mild pain or moderate pain. 100 tablet 1   amLODipine (NORVASC) 10 MG tablet Take 10 mg by mouth daily.     AquaLance Lancets 30G MISC      aspirin EC 81 MG tablet Take 81 mg by mouth daily. Swallow whole.     atorvastatin (LIPITOR) 40 MG tablet Take 40 mg by mouth daily.     docusate sodium (COLACE) 100 MG capsule Take 100 mg by mouth daily as needed for mild constipation.     ezetimibe (ZETIA) 10 MG tablet Take 1 tablet by mouth once daily 90 tablet 0   fluorometholone (FML) 0.1 % ophthalmic suspension Place 1 drop into both eyes daily as needed (irritation).     gabapentin (NEURONTIN) 300 MG capsule Take 1 capsule (300 mg total) by mouth 3 (three) times daily. 90 capsule 2   HUMULIN 70/30 (70-30) 100 UNIT/ML injection Inject 45 Units into the skin 2 (two) times daily with a meal. 10 mL 11   hydrochlorothiazide (HYDRODIURIL) 25 MG tablet Take 25 mg by mouth daily.     magnesium hydroxide (MILK OF MAGNESIA) 400 MG/5ML suspension Take 30 mLs by mouth daily as needed for mild constipation or moderate constipation. 355 mL 0   meclizine (ANTIVERT) 25 MG tablet Take 25 mg by mouth 3 (three) times daily as needed.     Meloxicam 15 MG TBDP Take 1 tablet by mouth daily with breakfast. 90 tablet 4   metFORMIN (GLUCOPHAGE) 1000 MG tablet Take 1,000 mg by mouth at bedtime.     methocarbamol (ROBAXIN) 500 MG tablet Take 500 mg by mouth every 8 (eight) hours as needed for muscle spasms.     metoprolol tartrate (LOPRESSOR) 25 MG tablet Take 1 tablet (25 mg total) by mouth 2 (two) times daily. 180 tablet 3   nitroGLYCERIN (NITROSTAT) 0.4 MG SL tablet Place 1 tablet (0.4 mg total) under the tongue every 5 (five) minutes as needed for chest pain. Dissolve one tablet under the tongue every 5 mintues as needed for chest pain. 25 tablet 1   ondansetron (ZOFRAN-ODT) 4 MG disintegrating tablet Take 1  tablet (4 mg total) by mouth every 6 (six) hours as needed for nausea. 20 tablet 0   pantoprazole (PROTONIX) 40 MG tablet Take 1 tablet by mouth once daily 90 tablet 0   polyethylene glycol (MIRALAX / GLYCOLAX) 17 g packet Take 17 g by mouth daily. 30 each 1   RYBELSUS 14 MG TABS Take 14 mg by mouth daily.     simethicone (MYLICON) 80 MG chewable tablet Chew 0.5 tablets (40 mg total) by mouth every 6 (six) hours as needed for flatulence (bloating). 30 tablet 0   SURE COMFORT INSULIN SYRINGE 31G X 5/16" 1 ML MISC  traMADol (ULTRAM) 50 MG tablet Take 1 tablet (50 mg total) by mouth every 6 (six) hours as needed for severe pain. 30 tablet 0   dicyclomine (BENTYL) 10 MG capsule Take 1 capsule (10 mg total) by mouth 4 (four) times daily -  before meals and at bedtime. (Patient not taking: Reported on 08/27/2022) 30 capsule 1   No current facility-administered medications for this visit.   Allergies:  Patient has no known allergies.   Social History: The patient  reports that she has never smoked. She has never used smokeless tobacco. She reports that she does not drink alcohol and does not use drugs.   Family History: The patient's family history includes Breast cancer in her mother; Diabetes in her sister and sister; Heart failure in her sister and sister; Lung cancer in her mother; Stroke in her father.   ROS:  Please see the history of present illness. Otherwise, complete review of systems is positive for none.  All other systems are reviewed and negative.   Physical Exam: VS:  Pulse 72   Ht 5\' 5"  (1.651 m)   Wt 180 lb (81.6 kg)   SpO2 93%   BMI 29.95 kg/m , BMI Body mass index is 29.95 kg/m.  Wt Readings from Last 3 Encounters:  05/31/23 180 lb (81.6 kg)  02/26/23 176 lb (79.8 kg)  09/21/22 181 lb (82.1 kg)    General: Patient appears comfortable at rest. HEENT: Conjunctiva and lids normal, oropharynx clear with moist mucosa. Neck: Supple, no elevated JVP or carotid bruits, no  thyromegaly. Lungs: Clear to auscultation, nonlabored breathing at rest. Cardiac: Regular rate and rhythm, no S3 or significant systolic murmur, no pericardial rub. Abdomen: Soft, nontender, no hepatomegaly, bowel sounds present, no guarding or rebound. Extremities: No pitting edema, distal pulses 2+. Skin: Warm and dry. Musculoskeletal: No kyphosis. Neuropsychiatric: Alert and oriented x3, affect grossly appropriate.  ECG: Normal sinus rhythm and no ST-T changes  Recent Labwork: 08/27/2022: BUN 18; Creatinine, Ser 1.16; Hemoglobin 13.0; Platelets 244; Potassium 4.2; Sodium 138     Component Value Date/Time   CHOL 195 06/12/2022 1426   TRIG 281 (H) 06/12/2022 1426   HDL 31 (L) 06/12/2022 1426   CHOLHDL 6.3 06/12/2022 1426   VLDL 56 (H) 06/12/2022 1426   LDLCALC 108 (H) 06/12/2022 1426     Assessment and Plan: Patient is a 72 year old F known to have mild carotid artery disease, HTN, HLD (followed by PCP), varicose veins with mild chronic edema presents for follow-up visit.  Noncardiac chest pain: Patient has chest tightness after walking for 2 blocks and stayed for 2 days.  Resolved by SL NTG.  She also has dysphagia which resolves with SL NTG.  I think the chest tightness is likely secondary to possible esophageal spasm/abnormality.  She has prior history of esophageal dilatation.  I strongly encouraged her to make an appointment with GI. coronary CTA in 05/2019 showing no CAD (coronary calcium score of 0) and small secundum ASD (7.1 x 3.2 mm) for which no further interventions recommended at this time.  Does not need cardiac PET at this time, will discontinue.  Mild carotid artery stenosis, 1 to 39% stenosis bilateral: Continue aspirin 81 mg once daily and atorvastatin 40 mg nightly.  Goal LDL goal less than 70.  HLD, unknown values: Goal LDL less than 70, continue atorvastatin 40 mg nightly.  Follows with PCP.  Positional dizziness: She has dizziness mainly with positional changes  but not with exertion.  She is  currently on meclizine but not helping her.  Follow with PCP.   HTN, controlled: Continue current antihypertensives, amlodipine 10 mg once daily, HCTZ 25 mg once daily, metoprolol tartrate 25 mg twice daily.  Follows up with PCP.  Management per PCP.    Medication Adjustments/Labs and Tests Ordered: Current medicines are reviewed at length with the patient today.  Concerns regarding medicines are outlined above.    Disposition:  Follow up as needed  Signed, Zea Kostka Verne Spurr, MD, 05/31/2023 8:55 AM    [facility] [address] [place], [place] [address]

## 2023-05-31 NOTE — Patient Instructions (Signed)
Medication Instructions:  Your physician recommends that you continue on your current medications as directed. Please refer to the Current Medication list given to you today.  *If you need a refill on your cardiac medications before your next appointment, please call your pharmacy*   Lab Work: None If you have labs (blood work) drawn today and your tests are completely normal, you will receive your results only by: MyChart Message (if you have MyChart) OR A paper copy in the mail If you have any lab test that is abnormal or we need to change your treatment, we will call you to review the results.   Testing/Procedures: None   Follow-Up: At [facility], you and your health needs are our priority.  As part of our continuing mission to provide you with exceptional heart care, we have created designated Provider Care Teams.  These Care Teams include your primary Cardiologist (physician) and Advanced Practice Providers (APPs -  Physician Assistants and Nurse Practitioners) who all work together to provide you with the care you need, when you need it.  We recommend signing up for the patient portal called "MyChart".  Sign up information is provided on this After Visit Summary.  MyChart is used to connect with patients for Virtual Visits (Telemedicine).  Patients are able to view lab/test results, encounter notes, upcoming appointments, etc.  Non-urgent messages can be sent to your provider as well.   To learn more about what you can do with MyChart, go to [email or web address].    Your next appointment:    Follow up as needed.   Provider:   You may see Vishnu P Mallipeddi, MD or one of the following Advanced Practice Providers on your designated Care Team:   [place], PA-C  Jacolyn Reedy, [place]     Other Instructions

## 2023-06-11 ENCOUNTER — Ambulatory Visit (HOSPITAL_COMMUNITY)
Admission: RE | Admit: 2023-06-11 | Discharge: 2023-06-11 | Disposition: A | Discharge status: Home / Self Care | Payer: Medicare Other | Source: Ambulatory Visit | Attending: General Surgery | Admitting: General Surgery

## 2023-06-11 DIAGNOSIS — Z9049 Acquired absence of other specified parts of digestive tract: Secondary | ICD-10-CM | POA: Diagnosis not present

## 2023-06-11 DIAGNOSIS — K432 Incisional hernia without obstruction or gangrene: Secondary | ICD-10-CM | POA: Diagnosis not present

## 2023-06-11 DIAGNOSIS — K429 Umbilical hernia without obstruction or gangrene: Secondary | ICD-10-CM | POA: Diagnosis not present

## 2023-06-11 LAB — POCT I-STAT CREATININE: Creatinine, Ser: 0.9 mg/dL (ref 0.44–1.00)

## 2023-06-11 MED ORDER — IOHEXOL 300 MG/ML  SOLN
100.0000 mL | Freq: Once | INTRAMUSCULAR | Status: AC | PRN
  Administered 2023-06-11: 100 mL via INTRAVENOUS

## 2023-06-12 ENCOUNTER — Other Ambulatory Visit: Payer: Self-pay | Admitting: Cardiology

## 2023-06-14 ENCOUNTER — Telehealth: Payer: Self-pay | Admitting: Internal Medicine

## 2023-06-14 NOTE — Telephone Encounter (Signed)
Medication was d/c'd by provider 09/21/22.   Pt notified and verbalized understanding.

## 2023-06-14 NOTE — Telephone Encounter (Signed)
*  STAT* If patient is at the pharmacy, call can be transferred to refill team.   1. Which medications need to be refilled? (please list name of each medication and dose if known) Isosorbide  2. Which pharmacy/location (including street and city if local pharmacy) is medication to be sent to? [id number] - [place], [place] - [address]  3. Do they need a 30 day or 90 day supply?  90 day supply

## 2023-06-19 NOTE — Progress Notes (Signed)
Can you let patient know that there is no hernia "no hole" but that her belly wall is weak and thin and this is what her PCP is seeing. There is nothing to repair and her intestines look good. She was having no pain or issues in May when I saw her and we did the scan to just check things. Tell her all is reassuring.

## 2023-07-08 DIAGNOSIS — I10 Essential (primary) hypertension: Secondary | ICD-10-CM | POA: Diagnosis not present

## 2023-07-08 DIAGNOSIS — E1169 Type 2 diabetes mellitus with other specified complication: Secondary | ICD-10-CM | POA: Diagnosis not present

## 2023-07-08 DIAGNOSIS — Z23 Encounter for immunization: Secondary | ICD-10-CM | POA: Diagnosis not present

## 2023-07-08 DIAGNOSIS — E7800 Pure hypercholesterolemia, unspecified: Secondary | ICD-10-CM | POA: Diagnosis not present

## 2023-07-08 DIAGNOSIS — R600 Localized edema: Secondary | ICD-10-CM | POA: Diagnosis not present

## 2023-07-08 DIAGNOSIS — K432 Incisional hernia without obstruction or gangrene: Secondary | ICD-10-CM | POA: Diagnosis not present

## 2023-07-10 ENCOUNTER — Encounter: Payer: Self-pay | Admitting: Gastroenterology

## 2023-07-29 ENCOUNTER — Other Ambulatory Visit: Payer: Self-pay

## 2023-07-29 DIAGNOSIS — I6523 Occlusion and stenosis of bilateral carotid arteries: Secondary | ICD-10-CM

## 2023-08-08 ENCOUNTER — Ambulatory Visit: Payer: Medicare Other

## 2023-08-08 ENCOUNTER — Ambulatory Visit (HOSPITAL_COMMUNITY): Payer: Medicare Other

## 2023-08-15 ENCOUNTER — Ambulatory Visit: Payer: Medicare Other | Admitting: Orthopedic Surgery

## 2023-08-15 ENCOUNTER — Other Ambulatory Visit (INDEPENDENT_AMBULATORY_CARE_PROVIDER_SITE_OTHER): Payer: Medicare Other

## 2023-08-15 DIAGNOSIS — Z981 Arthrodesis status: Secondary | ICD-10-CM

## 2023-08-15 MED ORDER — METHOCARBAMOL 500 MG PO TABS: 500.0000 mg | ORAL_TABLET | Freq: Four times a day (QID) | ORAL | 4 refills | Status: AC

## 2023-08-15 MED ORDER — MELOXICAM 15 MG PO TBDP: 1.0000 | ORAL_TABLET | Freq: Every day | ORAL | 4 refills | Status: DC

## 2023-08-15 NOTE — Progress Notes (Signed)
Orthopedic Spine Surgery Office Note   Assessment: Patient is a 72 y.o. female s/p T10-S1 fusion with Dr. Otelia Sergeant who has some chronic low back pain that is under control with current medications     Plan: -Activity as tolerated, no spine specific precautions -Since she has been getting good relief with the meloxicam and Robaxin that Dr. Otelia Sergeant had previously prescribed, provided her with new prescriptions for those today -She said she has an upcoming appointment with a kidney doctor and will bring of the swelling there as well.  I encouraged her to do that.  I told her we could get a Doppler to look for a DVT but she wanted to hold off until she talk to the kidney -Patient should return to office in 1 year, x-rays at next visit: Scoliosis x-rays   Patient expressed understanding of the plan and all questions were answered to the patient's satisfaction.    ___________________________________________________________________________     History:   Patient is a 72 y.o. female who presents today for follow-up on her lumbar spine. Patient had undergone several lumbar surgeries with Dr. Otelia Sergeant. Her current construct is from T10-S1.  She reports that she is still doing well.  She has some mild low back pain that gets better with meloxicam and Robaxin.  Dr. Otelia Sergeant have been previously prescribing these medications.  She does not have any pain radiating into either lower extremity.  She is still pleased with her surgical outcome thus far.  Of note, she does ports some right lower extremity swelling which she has noted for the last couple months.  She talked her primary care doctor who prescribed her a water pill.  She said that has not alleviated swelling.   Treatments tried: Meloxicam, Robaxin   Review of systems: Denies fevers and chills, night sweats, unexplained weight loss, history of cancer. Has had pain that wakes her at night     Physical Exam:   General: no acute distress, appears stated  age Neurologic: alert, answering questions appropriately, following commands Respiratory: unlabored breathing on room air, symmetric chest rise Psychiatric: appropriate affect, normal cadence to speech     MSK (spine):   -Strength exam                                                   Left                  Right EHL                              5/5                  5/5 TA                                 5/5                  5/5 GSC                             5/5                  5/5 Knee extension  5/5                  5/5 Hip flexion                    5/5                  5/5   -Sensory exam                           Sensation intact to light touch in L3-S1 nerve distributions of bilateral lower extremities   -Straight leg raise: negative bilaterally -Clonus: no beats bilaterally   -Left hip exam: no pain through range of motion -Right hip exam: no pain through range of motion   Imaging: XRs of the lumbar spine from 08/15/2023 were independently reviewed and interpreted, showing T10-S1 posterior instrumentation. No lucency around the screws. Screws appear in similar position to films on 03/21/2023. No broken instrumentation seen. There are side to side connectors between the lower lumbar rods and the more cranial rods. There are interbody devices in all the lumbar disc spaces. No fracture or dislocation seen. No evidence of instability on flexion/extension views.      Patient name: [name] Patient MRN: [id number] Date of visit: 08/15/23

## 2023-09-05 ENCOUNTER — Ambulatory Visit (HOSPITAL_COMMUNITY)
Admission: RE | Admit: 2023-09-05 | Discharge: 2023-09-05 | Disposition: A | Discharge status: Home / Self Care | Payer: Medicare Other | Source: Ambulatory Visit | Attending: Physician Assistant | Admitting: Physician Assistant

## 2023-09-05 ENCOUNTER — Ambulatory Visit: Payer: Medicare Other | Admitting: Physician Assistant

## 2023-09-05 VITALS — BP 118/72 | HR 68 | Temp 98.2°F | Ht 65.0 in | Wt 181.8 lb

## 2023-09-05 DIAGNOSIS — I6523 Occlusion and stenosis of bilateral carotid arteries: Secondary | ICD-10-CM | POA: Diagnosis not present

## 2023-09-05 NOTE — Progress Notes (Signed)
Office Note     CC:  follow up Requesting Provider:  Mirna Mires, MD  HPI: [name] is a 72 y.o. ([birth year]) female who presents for surveillance of carotid artery stenosis.  She has been followed on a 2-year basis.  Last ultrasound demonstrated 1 to 39% stenosis of bilateral internal carotid arteries.  She denies any diagnosis of CVA or TIA since last office visit.  She also denies any slurring speech, changes in vision, or one-sided weakness.  She is taking a daily aspirin and statin.  She follows regularly with her PCP every 3 months for management of chronic medical conditions mainly insulin-dependent diabetes, hyperlipidemia, and hypertension.  She denies tobacco use.   Past Medical History:  Diagnosis Date   Asthma    Bronchitis    Diabetes mellitus    Hyperlipidemia    Hypertension    Interatrial cardiac shunt    a. small secundum ASD vs pulmonary shunt.   Low back pain    Migraine    Mild carotid artery disease (HCC)    Osteoarthritis    Sciatica    Varicose veins    Vertigo     Past Surgical History:  Procedure Laterality Date   ABDOMINAL HYSTERECTOMY     BIOPSY  05/12/2019   Procedure: BIOPSY;  Surgeon: West Bali, MD;  Location: [facility];  Service: Endoscopy;;   CARPAL TUNNEL RELEASE Bilateral    CESAREAN SECTION     ESOPHAGOGASTRODUODENOSCOPY (EGD) WITH PROPOFOL N/A 05/12/2019   Procedure: ESOPHAGOGASTRODUODENOSCOPY (EGD) WITH PROPOFOL;  Surgeon: West Bali, MD;  normal-appearing esophagus s/p empiric dilation, moderate gastritis due to ASA s/p biopsy, normal examined duodenum.  Biopsies with gastritis.    EXCISION OF MESH N/A 01/18/2022   Procedure: PARTIAL EXCISION OF PRIOR MESH;  Surgeon: Lucretia Roers, MD;  Location: [facility];  Service: General;  Laterality: N/A;   INCISIONAL HERNIA REPAIR N/A 03/15/2021   Procedure: Linus Mako WITH MESH;  Surgeon: Lucretia Roers, MD;  Location: [facility];  Service: General;   Laterality: N/A;   INCISIONAL HERNIA REPAIR N/A 01/18/2022   Procedure: HERNIA REPAIR INCISIONAL W/ MESH;  Surgeon: Lucretia Roers, MD;  Location: [facility];  Service: General;  Laterality: N/A;   INGUINAL HERNIA REPAIR Left    LAPAROTOMY N/A 03/15/2021   Procedure: EXPLORATORY LAPAROTOMY;  Surgeon: Lucretia Roers, MD;  Location: [facility];  Service: General;  Laterality: N/A;   LUMBAR FUSION     LYSIS OF ADHESION N/A 03/15/2021   Procedure: LYSIS OF ADHESION;  Surgeon: Lucretia Roers, MD;  Location: [facility];  Service: General;  Laterality: N/A;   POSTERIOR LUMBAR FUSION 4 LEVEL N/A 03/04/2018   Procedure: Right transforaminal lumbar interbody fusion L1-2, L2-3 Posterior fusion T10, T11, T12, L1, L2 with T 11, T12 pedicle screws, superior sublaminar hooks T10, local bone graft, allograft cancellous chips Vivigen;  Surgeon: Kerrin Champagne, MD;  Location: [facility];  Service: Orthopedics;  Laterality: N/A;   SAVORY DILATION N/A 05/12/2019   Procedure: SAVORY DILATION;  Surgeon: West Bali, MD;  Location: [facility];  Service: Endoscopy;  Laterality: N/A;    Social History   Socioeconomic History   Marital status: Single    Spouse name: Not on file   Number of children: Not on file   Years of education: 12th    Highest education level: Not on file  Occupational History   Occupation: Disabled    Employer: UNEMPLOYED  Tobacco Use  Smoking status: Never   Smokeless tobacco: Never  Vaping Use   Vaping status: Never Used  Substance and Sexual Activity   Alcohol use: No    Alcohol/week: 0.0 standard drinks of alcohol   Drug use: No   Sexual activity: Yes    Birth control/protection: Surgical  Other Topics Concern   Not on file  Social History Narrative   No caffeine use. 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 2 CHILDREN: 1 IN [place], 1 IN MILITARY CURRENTLY STATIONED IN [place].   Social Determinants of Health   Financial Resource Strain:  Not on file  Food Insecurity: Not on file  Transportation Needs: Not on file  Physical Activity: Not on file  Stress: Not on file  Social Connections: Not on file  Intimate Partner Violence: Not on file    Family History  Problem Relation Age of Onset   Lung cancer Mother    Breast cancer Mother    Stroke Father    Heart failure Sister    Diabetes Sister    Heart failure Sister    Diabetes Sister    Colon cancer Neg Hx    Gastric cancer Neg Hx    Esophageal cancer Neg Hx     Current Outpatient Medications  Medication Sig Dispense Refill   acetaminophen (TYLENOL) 500 MG tablet Take 2 tablets (1,000 mg total) by mouth every 8 (eight) hours as needed for mild pain or moderate pain. 100 tablet 1   amLODipine (NORVASC) 10 MG tablet Take 10 mg by mouth daily.     AquaLance Lancets 30G MISC      aspirin EC 81 MG tablet Take 81 mg by mouth daily. Swallow whole.     atorvastatin (LIPITOR) 40 MG tablet Take 40 mg by mouth daily.     dicyclomine (BENTYL) 10 MG capsule Take 1 capsule (10 mg total) by mouth 4 (four) times daily -  before meals and at bedtime. 30 capsule 1   docusate sodium (COLACE) 100 MG capsule Take 100 mg by mouth daily as needed for mild constipation.     ezetimibe (ZETIA) 10 MG tablet Take 1 tablet by mouth once daily 90 tablet 0   fluorometholone (FML) 0.1 % ophthalmic suspension Place 1 drop into both eyes daily as needed (irritation).     HUMULIN 70/30 (70-30) 100 UNIT/ML injection Inject 45 Units into the skin 2 (two) times daily with a meal. 10 mL 11   hydrochlorothiazide (HYDRODIURIL) 25 MG tablet Take 25 mg by mouth daily.     magnesium hydroxide (MILK OF MAGNESIA) 400 MG/5ML suspension Take 30 mLs by mouth daily as needed for mild constipation or moderate constipation. 355 mL 0   meclizine (ANTIVERT) 25 MG tablet Take 25 mg by mouth 3 (three) times daily as needed.     Meloxicam 15 MG TBDP Take 1 tablet by mouth daily with breakfast. 90 tablet 4   metFORMIN  (GLUCOPHAGE) 1000 MG tablet Take 1,000 mg by mouth at bedtime.     methocarbamol (ROBAXIN) 500 MG tablet Take 1 tablet (500 mg total) by mouth 4 (four) times daily. 50 tablet 4   metoprolol tartrate (LOPRESSOR) 25 MG tablet Take 1 tablet (25 mg total) by mouth 2 (two) times daily. 180 tablet 3   nitroGLYCERIN (NITROSTAT) 0.4 MG SL tablet Place 1 tablet (0.4 mg total) under the tongue every 5 (five) minutes as needed for chest pain. Dissolve one tablet under the tongue every 5 mintues as needed for chest  pain. 25 tablet 1   ondansetron (ZOFRAN-ODT) 4 MG disintegrating tablet Take 1 tablet (4 mg total) by mouth every 6 (six) hours as needed for nausea. 20 tablet 0   pantoprazole (PROTONIX) 40 MG tablet Take 1 tablet by mouth once daily 90 tablet 0   polyethylene glycol (MIRALAX / GLYCOLAX) 17 g packet Take 17 g by mouth daily. 30 each 1   RYBELSUS 14 MG TABS Take 14 mg by mouth daily.     simethicone (MYLICON) 80 MG chewable tablet Chew 0.5 tablets (40 mg total) by mouth every 6 (six) hours as needed for flatulence (bloating). 30 tablet 0   SURE COMFORT INSULIN SYRINGE 31G X 5/16" 1 ML MISC      traMADol (ULTRAM) 50 MG tablet Take 1 tablet (50 mg total) by mouth every 6 (six) hours as needed for severe pain. 30 tablet 0   gabapentin (NEURONTIN) 300 MG capsule Take 1 capsule (300 mg total) by mouth 3 (three) times daily. 90 capsule 2   No current facility-administered medications for this visit.    No Known Allergies   REVIEW OF SYSTEMS:   [X]  denotes positive finding, [ ]  denotes negative finding Cardiac  Comments:  Chest pain or chest pressure:    Shortness of breath upon exertion:    Short of breath when lying flat:    Irregular heart rhythm:        Vascular    Pain in calf, thigh, or hip brought on by ambulation:    Pain in feet at night that wakes you up from your sleep:     Blood clot in your veins:    Leg swelling:         Pulmonary    Oxygen at home:    Productive cough:      Wheezing:         Neurologic    Sudden weakness in arms or legs:     Sudden numbness in arms or legs:     Sudden onset of difficulty speaking or slurred speech:    Temporary loss of vision in one eye:     Problems with dizziness:         Gastrointestinal    Blood in stool:     Vomited blood:         Genitourinary    Burning when urinating:     Blood in urine:        Psychiatric    Major depression:         Hematologic    Bleeding problems:    Problems with blood clotting too easily:        Skin    Rashes or ulcers:        Constitutional    Fever or chills:      PHYSICAL EXAMINATION:  Vitals:   09/05/23 1056  BP: 118/72  Pulse: 68  Temp: 98.2 F (36.8 C)  TempSrc: Temporal  SpO2: 91%  Weight: 181 lb 12.8 oz (82.5 kg)  Height: 5\' 5"  (1.651 m)    General:  WDWN in NAD; vital signs documented above Gait: Not observed HENT: WNL, normocephalic Pulmonary: normal non-labored breathing , without Rales, rhonchi,  wheezing Cardiac: regular HR Abdomen: soft, NT, no masses Skin: without rashes Vascular Exam/Pulses: palpable radial pulses Extremities: without ischemic changes, without Gangrene , without cellulitis; without open wounds;  Musculoskeletal: no muscle wasting or atrophy  Neurologic: A&O X 3; CN grossly intact Psychiatric:  The pt has Normal affect.  Non-Invasive Vascular Imaging:   1 to 39% stenosis of bilateral internal carotid arteries    ASSESSMENT/PLAN:: 72 y.o. female here for follow up for surveillance of carotid artery stenosis  Subjectively the patient has not experienced any neurological events since last office visit.  She denies any diagnosis of CVA or TIA.  Carotid duplex is unchanged with 1 to 39% stenosis of bilateral internal carotid arteries.  She will continue her aspirin and statin daily.  We will repeat carotid duplex in 2 years.  We will try to get her scheduled for the [facility] office at that time.  She will continue to follow  regularly with her PCP for management of chronic medical conditions including hypertension, hyperlipidemia, and insulin-dependent diabetes mellitus.  She knows to call/return office sooner with any questions or concerns.   Emilie Rutter, PA-C [facility] [phone]  Clinic MD:   Karin Lieu

## 2023-09-26 ENCOUNTER — Other Ambulatory Visit: Payer: Self-pay | Admitting: Internal Medicine

## 2023-09-27 ENCOUNTER — Other Ambulatory Visit: Payer: Self-pay | Admitting: Gastroenterology

## 2023-09-27 ENCOUNTER — Other Ambulatory Visit: Payer: Self-pay | Admitting: Internal Medicine

## 2023-09-27 ENCOUNTER — Other Ambulatory Visit: Payer: Self-pay | Admitting: Orthopedic Surgery

## 2023-09-27 DIAGNOSIS — K219 Gastro-esophageal reflux disease without esophagitis: Secondary | ICD-10-CM

## 2023-09-30 DIAGNOSIS — E785 Hyperlipidemia, unspecified: Secondary | ICD-10-CM | POA: Diagnosis not present

## 2023-09-30 DIAGNOSIS — I10 Essential (primary) hypertension: Secondary | ICD-10-CM | POA: Diagnosis not present

## 2023-09-30 DIAGNOSIS — N1831 Chronic kidney disease, stage 3a: Secondary | ICD-10-CM | POA: Diagnosis not present

## 2023-09-30 DIAGNOSIS — E1129 Type 2 diabetes mellitus with other diabetic kidney complication: Secondary | ICD-10-CM | POA: Diagnosis not present

## 2023-09-30 DIAGNOSIS — R8290 Unspecified abnormal findings in urine: Secondary | ICD-10-CM | POA: Diagnosis not present

## 2023-09-30 NOTE — Telephone Encounter (Signed)
Sending in refills. Patient is due for routine follow-up. Please arrange within the next 8 weeks.

## 2023-10-01 ENCOUNTER — Other Ambulatory Visit (HOSPITAL_COMMUNITY): Payer: Self-pay | Admitting: Nephrology

## 2023-10-01 DIAGNOSIS — N1831 Chronic kidney disease, stage 3a: Secondary | ICD-10-CM

## 2023-10-01 DIAGNOSIS — R8290 Unspecified abnormal findings in urine: Secondary | ICD-10-CM

## 2023-10-01 DIAGNOSIS — I10 Essential (primary) hypertension: Secondary | ICD-10-CM

## 2023-10-03 NOTE — Telephone Encounter (Signed)
Called pt but had to leave a message asking her to call the office to schedule an appt for additional refills

## 2023-10-14 ENCOUNTER — Ambulatory Visit (HOSPITAL_COMMUNITY)
Admission: RE | Admit: 2023-10-14 | Discharge: 2023-10-14 | Disposition: A | Discharge status: Home / Self Care | Payer: Medicare Other | Source: Ambulatory Visit | Attending: Nephrology | Admitting: Nephrology

## 2023-10-14 DIAGNOSIS — R8290 Unspecified abnormal findings in urine: Secondary | ICD-10-CM | POA: Insufficient documentation

## 2023-10-14 DIAGNOSIS — N189 Chronic kidney disease, unspecified: Secondary | ICD-10-CM | POA: Diagnosis not present

## 2023-10-14 DIAGNOSIS — N1831 Chronic kidney disease, stage 3a: Secondary | ICD-10-CM | POA: Diagnosis not present

## 2023-10-14 DIAGNOSIS — I10 Essential (primary) hypertension: Secondary | ICD-10-CM | POA: Insufficient documentation

## 2023-10-18 DIAGNOSIS — E1129 Type 2 diabetes mellitus with other diabetic kidney complication: Secondary | ICD-10-CM | POA: Diagnosis not present

## 2023-10-18 DIAGNOSIS — R8290 Unspecified abnormal findings in urine: Secondary | ICD-10-CM | POA: Diagnosis not present

## 2023-10-18 DIAGNOSIS — I10 Essential (primary) hypertension: Secondary | ICD-10-CM | POA: Diagnosis not present

## 2023-10-18 DIAGNOSIS — N1831 Chronic kidney disease, stage 3a: Secondary | ICD-10-CM | POA: Diagnosis not present

## 2023-10-18 DIAGNOSIS — E785 Hyperlipidemia, unspecified: Secondary | ICD-10-CM | POA: Diagnosis not present

## 2023-10-24 NOTE — Progress Notes (Signed)
 Referring Provider: Leigh Lung, MD Primary Care Physician:  Leigh Lung, MD Primary GI Physician: Dr. Cindie  Chief Complaint  Patient presents with   Follow-up    Follow up. No problems     HPI:   [name] is a 73 y.o. female with history of dysphagia s/p empiric dilation in 2020, constipation, GERD, small bowel malrotation. Exploratory laparotomy with lysis of adhesions/reduction of small bowel to the right side of the abdomen and colon to the left side of abdomen (Ladd's procedure), incisional hernia repair with mesh 03/15/21. Ventral hernia repair on 01/18/2022. No prior colonoscopy with patient declining thus far. She is presenting today for follow-up of GERD.   Last seen in the office 03/02/2022.  She had no GI concerns.  GERD controlled on Protonix  40 mg daily.  Again declined colonoscopy as well as Cologuard.  Stated she was not going to have these procedures/testing unless she had to.  Recommended continuing current medications and follow-up in 1 year.  Today:  Reports GERD is well-controlled on pantoprazole  40 mg daily.  Admits to solid food dysphagia over the last couple of weeks.  Occurs primarily when eating something spicy like barbecue chicken or Doritos.  Feels foods get stuck around the sternal notch.  She drinks some water  and items will pass.  She wants to continue to monitor this for now.  She also reports postprandial epigastric and RUQ abdominal pain for the last couple of weeks.  No nausea or vomiting.  Bowels are moving well.  No BRBPR, melena.  No NSAIDs.    Past Medical History:  Diagnosis Date   Asthma    Bronchitis    Diabetes mellitus    Hyperlipidemia    Hypertension    Interatrial cardiac shunt    a. small secundum ASD vs pulmonary shunt.   Low back pain    Migraine    Mild carotid artery disease (HCC)    Osteoarthritis    Sciatica    Varicose veins    Vertigo     Past Surgical History:  Procedure Laterality Date   ABDOMINAL  HYSTERECTOMY     BIOPSY  05/12/2019   Procedure: BIOPSY;  Surgeon: Harvey Margo LITTIE, MD;  [facility]: [facility];  Service: Endoscopy;;   CARPAL TUNNEL RELEASE Bilateral    CESAREAN SECTION     ESOPHAGOGASTRODUODENOSCOPY (EGD) WITH PROPOFOL  N/A 05/12/2019   Procedure: ESOPHAGOGASTRODUODENOSCOPY (EGD) WITH PROPOFOL ;  Surgeon: Harvey Margo LITTIE, MD;  normal-appearing esophagus s/p empiric dilation, moderate gastritis due to ASA s/p biopsy, normal examined duodenum.  Biopsies with gastritis.    EXCISION OF MESH N/A 01/18/2022   Procedure: PARTIAL EXCISION OF PRIOR MESH;  Surgeon: Kallie Manuelita BROCKS, MD;  Location: [facility];  Service: General;  Laterality: N/A;   INCISIONAL HERNIA REPAIR N/A 03/15/2021   Procedure: SHIRLENE BAMBERG WITH MESH;  Surgeon: Kallie Manuelita BROCKS, MD;  Location: [facility];  Service: General;  Laterality: N/A;   INCISIONAL HERNIA REPAIR N/A 01/18/2022   Procedure: HERNIA REPAIR INCISIONAL W/ MESH;  Surgeon: Kallie Manuelita BROCKS, MD;  Location: [facility];  Service: General;  Laterality: N/A;   INGUINAL HERNIA REPAIR Left    LAPAROTOMY N/A 03/15/2021   Procedure: EXPLORATORY LAPAROTOMY;  Surgeon: Kallie Manuelita BROCKS, MD;  Location: [facility];  Service: General;  Laterality: N/A;   LUMBAR FUSION     LYSIS OF ADHESION N/A 03/15/2021   Procedure: LYSIS OF ADHESION;  Surgeon: Kallie Manuelita BROCKS, MD;  Location: [facility];  Service:  General;  Laterality: N/A;   POSTERIOR LUMBAR FUSION 4 LEVEL N/A 03/04/2018   Procedure: Right transforaminal lumbar interbody fusion L1-2, L2-3 Posterior fusion T10, T11, T12, L1, L2 with T 11, T12 pedicle screws, superior sublaminar hooks T10, local bone graft, allograft cancellous chips Vivigen;  Surgeon: Lucilla Lynwood BRAVO, MD;  Location: [facility];  Service: Orthopedics;  Laterality: N/A;   SAVORY DILATION N/A 05/12/2019   Procedure: SAVORY DILATION;  Surgeon: Harvey Margo CROME, MD;  [facility]: [facility];  Service: Endoscopy;  Laterality: N/A;    Current Outpatient Medications   Medication Sig Dispense Refill   acetaminophen  (TYLENOL ) 500 MG tablet Take 2 tablets (1,000 mg total) by mouth every 8 (eight) hours as needed for mild pain or moderate pain. 100 tablet 1   amLODipine  (NORVASC ) 10 MG tablet Take 10 mg by mouth daily.     AquaLance Lancets 30G MISC      aspirin  EC 81 MG tablet Take 81 mg by mouth daily. Swallow whole.     atorvastatin  (LIPITOR) 40 MG tablet Take 40 mg by mouth daily.     docusate sodium  (COLACE) 100 MG capsule Take 100 mg by mouth daily as needed for mild constipation.     ezetimibe  (ZETIA ) 10 MG tablet Take 1 tablet by mouth once daily 90 tablet 3   gabapentin  (NEURONTIN ) 300 MG capsule TAKE 1 CAPSULE BY MOUTH THREE TIMES DAILY 90 capsule 0   HUMULIN  70/30 (70-30) 100 UNIT/ML injection Inject 45 Units into the skin 2 (two) times daily with a meal. 10 mL 11   hydrochlorothiazide  (HYDRODIURIL ) 25 MG tablet Take 25 mg by mouth daily.     magnesium  hydroxide (MILK OF MAGNESIA) 400 MG/5ML suspension Take 30 mLs by mouth daily as needed for mild constipation or moderate constipation. 355 mL 0   meclizine (ANTIVERT) 25 MG tablet Take 25 mg by mouth 3 (three) times daily as needed.     metFORMIN  (GLUCOPHAGE ) 1000 MG tablet Take 1,000 mg by mouth at bedtime.     metoprolol  tartrate (LOPRESSOR ) 25 MG tablet Take 1 tablet by mouth twice daily 180 tablet 3   nitroGLYCERIN  (NITROSTAT ) 0.4 MG SL tablet Place 1 tablet (0.4 mg total) under the tongue every 5 (five) minutes as needed for chest pain. Dissolve one tablet under the tongue every 5 mintues as needed for chest pain. 25 tablet 1   polyethylene glycol (MIRALAX  / GLYCOLAX ) 17 g packet Take 17 g by mouth daily. 30 each 1   RYBELSUS 14 MG TABS Take 14 mg by mouth daily.     simethicone  (MYLICON) 80 MG chewable tablet Chew 0.5 tablets (40 mg total) by mouth every 6 (six) hours as needed for flatulence (bloating). 30 tablet 0   SURE COMFORT INSULIN  SYRINGE 31G X 5/16 1 ML MISC      dicyclomine  (BENTYL ) 10  MG capsule Take 1 capsule (10 mg total) by mouth 4 (four) times daily -  before meals and at bedtime. (Patient not taking: Reported on 10/25/2023) 30 capsule 1   fluorometholone  (FML) 0.1 % ophthalmic suspension Place 1 drop into both eyes daily as needed (irritation).     Meloxicam  15 MG TBDP Take 1 tablet by mouth daily with breakfast. (Patient not taking: Reported on 10/25/2023) 90 tablet 4   methocarbamol  (ROBAXIN ) 500 MG tablet Take 1 tablet (500 mg total) by mouth 4 (four) times daily. (Patient not taking: Reported on 10/25/2023) 50 tablet 4   ondansetron  (ZOFRAN -ODT) 4 MG disintegrating tablet Take  1 tablet (4 mg total) by mouth every 6 (six) hours as needed for nausea. (Patient not taking: Reported on 10/25/2023) 20 tablet 0   pantoprazole  (PROTONIX ) 40 MG tablet Take 1 tablet (40 mg total) by mouth 2 (two) times daily before a meal. 60 tablet 3   traMADol  (ULTRAM ) 50 MG tablet Take 1 tablet (50 mg total) by mouth every 6 (six) hours as needed for severe pain. (Patient not taking: Reported on 10/25/2023) 30 tablet 0   No current facility-administered medications for this visit.    Allergies as of 10/25/2023   (No Known Allergies)    Family History  Problem Relation Age of Onset   Lung cancer Mother    Breast cancer Mother    Stroke Father    Heart failure Sister    Diabetes Sister    Heart failure Sister    Diabetes Sister    Colon cancer Neg Hx    Gastric cancer Neg Hx    Esophageal cancer Neg Hx     Social History   Socioeconomic History   Marital status: Single    Spouse name: Not on file   Number of children: Not on file   Years of education: 12th    Highest education level: Not on file  Occupational History   Occupation: Disabled    Employer: UNEMPLOYED  Tobacco Use   Smoking status: Never   Smokeless tobacco: Never  Vaping Use   Vaping status: Never Used  Substance and Sexual Activity   Alcohol use: No    Alcohol/week: 0.0 standard drinks of alcohol   Drug  use: No   Sexual activity: Yes    Birth control/protection: Surgical  Other Topics Concern   Not on file  Social History Narrative   No caffeine use. 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 2 CHILDREN: 1 IN [place], 1 IN MILITARY CURRENTLY STATIONED IN [place].   Social Drivers of Corporate Investment Banker Strain: Not on file  Food Insecurity: Not on file  Transportation Needs: Not on file  Physical Activity: Not on file  Stress: Not on file  Social Connections: Not on file    Review of Systems: Gen: Denies fever, chills, cold or flulike symptoms, presyncope, syncope. CV: Denies chest pain, palpitations. Resp: Denies dyspnea, cough. GI: See HPI Heme: See HPI  Physical Exam: BP (!) 136/57 (BP Location: Right Arm, Patient Position: Sitting, Cuff Size: Large)   Pulse 73   Temp (!) 96.9 F (36.1 C) (Temporal)   Wt 181 lb 12.8 oz (82.5 kg)   BMI 30.25 kg/m  General:   Alert and oriented. No distress noted. Pleasant and cooperative.  Head:  Normocephalic and atraumatic. Eyes:  Conjuctiva clear without scleral icterus. Heart:  S1, S2 present without murmurs appreciated. Lungs:  Clear to auscultation bilaterally. No wheezes, rales, or rhonchi. No distress.  Abdomen:  +BS, soft,  and non-distended. Mild TTP in epigastric and RUQ region. No rebound or guarding. No HSM or masses noted. Msk:  Symmetrical without gross deformities. Normal posture. Extremities:  Without edema. Neurologic:  Alert and  oriented x4 Psych:  Normal mood and affect.    Assessment:  73 year old female with history of dysphagia s/p empiric dilation in 2020, constipation, GERD, small bowel malrotation. Exploratory laparotomy with lysis of adhesions/reduction of small bowel to the right side of the abdomen and colon to the left side of abdomen (Ladd's procedure), incisional hernia repair with mesh 03/15/21. Ventral hernia repair on 01/18/2022. No prior  colonoscopy with  patient declining, presenting today for follow-up of GERD.  Also reporting dysphagia and epigastric/RUQ abdominal pain.  GERD: Typical heartburn/reflux symptoms are well-controlled on pantoprazole  daily.  Dysphagia: Recurrent solid food dysphagia for the last couple of weeks.  Could be related to GERD, esophageal web, ring, stricture, and unable to rule out malignancy.  Last EGD in 2020 with empiric dilation and had done well until recently. Offered repeat EGD versus barium pill esophagram, but patient declined both requesting to monitor for now.  Epigastric/RUQ abdominal pain: 2 weeks of postprandial epigastric and RUQ abdominal pain without nausea or vomiting.  She does have tenderness in the epigastric and RUQ region on exam today.  History of cholecystectomy.  May have gastritis, duodenitis, atypical GERD.  I offered upper endoscopy for further evaluation, but patient preferred medication management for now.  Will increase pantoprazole  to 40 mg twice daily and see how she does with this.  If persistent symptoms, will pursue further evaluation.    Plan:  Increase pantoprazole  to 40 mg twice daily 30 minutes before breakfast and dinner. Follow a GERD diet:  Avoid fried, fatty, greasy, spicy, citrus foods. Avoid caffeine and carbonated beverages. Avoid chocolate. Try eating 4-6 small meals a day rather than 3 large meals. Do not eat within 3 hours of laying down. Prop head of bed up on wood or bricks to create a 6 inch incline. Dysphagia precautions:  Eat slowly, take small bites, chew thoroughly, drink plenty of liquids throughout meals.  Avoid trough textures All meats should be chopped finely.  If something gets hung in your esophagus and will not come up or go down, proceed to the emergency room.   Follow-up in 8 weeks or sooner if needed.   Josette Centers, PA-C [facility] 10/25/2023

## 2023-10-25 ENCOUNTER — Ambulatory Visit: Payer: Medicare Other | Admitting: Gastroenterology

## 2023-10-25 ENCOUNTER — Encounter: Payer: Self-pay | Admitting: Gastroenterology

## 2023-10-25 VITALS — BP 136/57 | HR 73 | Temp 96.9°F | Wt 181.8 lb

## 2023-10-25 DIAGNOSIS — K219 Gastro-esophageal reflux disease without esophagitis: Secondary | ICD-10-CM | POA: Diagnosis not present

## 2023-10-25 DIAGNOSIS — R1011 Right upper quadrant pain: Secondary | ICD-10-CM

## 2023-10-25 DIAGNOSIS — R1310 Dysphagia, unspecified: Secondary | ICD-10-CM | POA: Diagnosis not present

## 2023-10-25 DIAGNOSIS — R1013 Epigastric pain: Secondary | ICD-10-CM

## 2023-10-25 MED ORDER — PANTOPRAZOLE SODIUM 40 MG PO TBEC: 40.0000 mg | DELAYED_RELEASE_TABLET | Freq: Two times a day (BID) | ORAL | 3 refills | Status: DC

## 2023-10-25 NOTE — Patient Instructions (Signed)
 Increase pantoprazole  to 40 mg twice daily 30 minutes before breakfast and dinner.  Follow a GERD diet/lifestyle:  Avoid fried, fatty, greasy, spicy, citrus foods. Avoid caffeine and carbonated beverages. Avoid chocolate. Try eating 4-6 small meals a day rather than 3 large meals. Do not eat within 3 hours of laying down. Prop head of bed up on wood or bricks to create a 6 inch incline.   Swallowing precautions:  Eat slowly, take small bites, chew thoroughly, drink plenty of liquids throughout meals.  Avoid trough textures All meats should be chopped finely.  If something gets hung in your esophagus and will not come up or go down, proceed to the emergency room.    I will plan to see you back in the office in 8 weeks or sooner if needed.  Josette Centers, PA-C [facility]

## 2023-11-11 DIAGNOSIS — N1831 Chronic kidney disease, stage 3a: Secondary | ICD-10-CM | POA: Diagnosis not present

## 2023-11-11 DIAGNOSIS — I10 Essential (primary) hypertension: Secondary | ICD-10-CM | POA: Diagnosis not present

## 2023-11-11 DIAGNOSIS — E785 Hyperlipidemia, unspecified: Secondary | ICD-10-CM | POA: Diagnosis not present

## 2023-11-11 DIAGNOSIS — E1129 Type 2 diabetes mellitus with other diabetic kidney complication: Secondary | ICD-10-CM | POA: Diagnosis not present

## 2023-11-25 DIAGNOSIS — R600 Localized edema: Secondary | ICD-10-CM | POA: Diagnosis not present

## 2023-11-25 DIAGNOSIS — N1832 Chronic kidney disease, stage 3b: Secondary | ICD-10-CM | POA: Diagnosis not present

## 2023-11-25 DIAGNOSIS — K432 Incisional hernia without obstruction or gangrene: Secondary | ICD-10-CM | POA: Diagnosis not present

## 2023-11-25 DIAGNOSIS — E7800 Pure hypercholesterolemia, unspecified: Secondary | ICD-10-CM | POA: Diagnosis not present

## 2023-11-25 DIAGNOSIS — I129 Hypertensive chronic kidney disease with stage 1 through stage 4 chronic kidney disease, or unspecified chronic kidney disease: Secondary | ICD-10-CM | POA: Diagnosis not present

## 2023-11-25 DIAGNOSIS — E1122 Type 2 diabetes mellitus with diabetic chronic kidney disease: Secondary | ICD-10-CM | POA: Diagnosis not present

## 2023-11-25 DIAGNOSIS — E1169 Type 2 diabetes mellitus with other specified complication: Secondary | ICD-10-CM | POA: Diagnosis not present

## 2023-11-25 DIAGNOSIS — E1165 Type 2 diabetes mellitus with hyperglycemia: Secondary | ICD-10-CM | POA: Diagnosis not present

## 2023-12-02 ENCOUNTER — Other Ambulatory Visit: Payer: Self-pay

## 2023-12-02 NOTE — Progress Notes (Addendum)
 12/03/2023 Name: [name] MRN: [id number] DOB: 06-11-1951  Chief Complaint  Patient presents with   Diabetes    [employer]    GERALYNN CAPRI is a 73 y.o. year old female who presented for a telephone visit.   They were referred to the pharmacist by a quality report for assistance in managing diabetes.    Subjective:  Care Team: Primary Care Provider: Wilburn Handler, MD ; Next Scheduled Visit: 12/23/2023   Medication Access/Adherence  Current Pharmacy:  [facility] [address], [place] - [address] [place] [address] Phone: [phone] Fax: [phone]   Patient reports affordability concerns with their medications: No .Patient getting Rybelsus and insulin  through PAP, all other meds zero copay at local pharmacy Patient reports access/transportation concerns to their pharmacy: No .Currently utilizing mail order for some maintenance meds, will assist in transferring all eligible medications to mail order pharmacy Patient reports adherence concerns with their medications:  No . Pt uses pill box    Diabetes:  Current medications: Rybelsus 14 mg daily, Metformin  1000mg  twice daily, Relion Novolin 70/30 units twice daily.    Current glucose readings: FBG: 230s - 250s, at bedtime: 120s - 140s Patient reports needing a new glucometer. The one she has is older but she is still able to test twice daily   Patient denies hypoglycemic s/sx including dizziness, shakiness, sweating. Patient reports hyperglycemic symptoms including polydipsia, polyphagia, blurred vision- annual eye exam in May.  Current meal patterns: typically eats twice daily - Breakfast: pt normally skips, if does, eats cereal (corn flakes, smacks) and boiled eggs - Lunch: peas, ham; typically a meat and vegetable - Supper: nabs or snacks  - Snacks apple, popcorn (kettle corn) - Drinks ~40 oz water daily, lemon sometimes added to water   Current  physical activity: pt reports home exercises (some seated), marching, light cardio exercises; pt reports doing at least one of these a day  Current medication access support: Rybelsus and insulin    Objective:  Lab Results  Component Value Date   HGBA1C 7.9 (H) 01/16/2022    Lab Results  Component Value Date   CREATININE 0.90 06/11/2023   BUN 18 08/27/2022   NA 138 08/27/2022   K 4.2 08/27/2022   CL 101 08/27/2022   CO2 29 08/27/2022    Lab Results  Component Value Date   CHOL 195 06/12/2022   HDL 31 (L) 06/12/2022   LDLCALC 108 (H) 06/12/2022   TRIG 281 (H) 06/12/2022   CHOLHDL 6.3 06/12/2022    Medications Reviewed Today     Reviewed by Livia Riffle, [facility] (Pharmacist) on 12/02/23 at 1507  Med List Status: <None>   Medication Order Taking? Sig Documenting Provider Last Dose Status Informant  acetaminophen  (TYLENOL ) 500 MG tablet [id number] No Take 2 tablets (1,000 mg total) by mouth every 8 (eight) hours as needed for mild pain or moderate pain.  Patient not taking: Reported on 12/02/2023   Diedra Fowler, MD Not Taking Active   amLODipine  (NORVASC ) 10 MG tablet [id number] Yes Take 10 mg by mouth daily. [provider] Taking Active Self, Pharmacy Records, Other  AquaLance Lancets 30G MISC [id number] Yes  [provider] Taking Active Self, Pharmacy Records, Other  aspirin  EC 81 MG tablet [id number] Yes Take 81 mg by mouth daily. Swallow whole. [provider] Taking Active Self, Pharmacy Records, Other  atorvastatin  (LIPITOR) 40 MG tablet [id number] Yes Take 40 mg by mouth daily. [provider] Taking Active Self, Pharmacy Records, Other  dapagliflozin propanediol (FARXIGA) 5 MG TABS tablet [id number] No Take 5 mg by mouth daily.  Patient not taking: Reported on 12/02/2023   [provider] Not Taking Active            Med Note (   Mon Dec 02, 2023  1:56 PM) Pt reports having not started, copay was too expensive. Nephrology  office assisting with PAP.  dicyclomine  (BENTYL ) 10 MG capsule [id number] No Take 1 capsule (10 mg total) by mouth 4 (four) times daily -  before meals and at bedtime.  Patient not taking: Reported on 10/25/2023   Awilda Bogus, MD Not Taking Active Self, Pharmacy Records, Other  docusate sodium  (COLACE) 100 MG capsule [id number] No Take 100 mg by mouth daily as needed for mild constipation.  Patient not taking: Reported on 12/02/2023   [provider] Not Taking Active Self, Pharmacy Records, Other  ezetimibe  (ZETIA ) 10 MG tablet [id number] Yes Take 1 tablet by mouth once daily Mallipeddi, Vishnu P, MD Taking Active   fluorometholone  (FML) 0.1 % ophthalmic suspension [id number] No Place 1 drop into both eyes daily as needed (irritation).  Patient not taking: Reported on 12/02/2023   [provider] Not Taking Active Self, Pharmacy Records, Other  gabapentin  (NEURONTIN ) 300 MG capsule [id number] Yes TAKE 1 CAPSULE BY MOUTH THREE TIMES DAILY Diedra Fowler, MD Taking Active   HUMULIN  70/30 (70-30) 100 UNIT/ML injection [id number] No Inject 45 Units into the skin 2 (two) times daily with a meal.  Patient not taking: Reported on 12/02/2023   Colin Dawley, MD Not Taking Active Self, Pharmacy Records, Other  hydrochlorothiazide  (HYDRODIURIL ) 25 MG tablet [id number] Yes Take 25 mg by mouth daily. [provider] Taking Active Self, Pharmacy Records, Other  insulin  aspart protamine- aspart (NOVOLOG  MIX 70/30) (70-30) 100 UNIT/ML injection [id number] Yes Inject 45 Units into the skin 2 (two) times daily before a meal. [provider] Taking Active Self  magnesium  hydroxide (MILK OF MAGNESIA) 400 MG/5ML suspension [id number] No Take 30 mLs by mouth daily as needed for mild constipation or moderate constipation.  Patient not taking: Reported on 12/02/2023   Awilda Bogus, MD Not Taking Active Self, Pharmacy Records, Other  meclizine (ANTIVERT) 25 MG tablet [id number]  Yes Take 25 mg by mouth 3 (three) times daily as needed.  Patient taking differently: Take 25 mg by mouth 4 (four) times daily.   [provider] Taking Active Self, Pharmacy Records, Other  metFORMIN  (GLUCOPHAGE ) 1000 MG tablet [id number] Yes Take 1,000 mg by mouth 2 (two) times daily. [provider] Taking Active Self, Pharmacy Records, Other  methocarbamol  (ROBAXIN ) 500 MG tablet [id number] Yes Take 1 tablet (500 mg total) by mouth 4 (four) times daily. Diedra Fowler, MD Taking Active   metoprolol  tartrate (LOPRESSOR ) 25 MG tablet [id number] Yes Take 1 tablet by mouth twice daily Mallipeddi, Vishnu P, MD Taking Active   nitroGLYCERIN  (NITROSTAT ) 0.4 MG SL tablet [id number]  Place 1 tablet (0.4 mg total) under the tongue every 5 (five) minutes as needed for chest pain. Dissolve one tablet under the tongue every 5 mintues as needed for chest pain. Mallipeddi, Vishnu P, MD  Active   ondansetron  (ZOFRAN -ODT) 4 MG disintegrating tablet [id number] No Take 1 tablet (4 mg total) by mouth every 6 (six) hours as needed for nausea.  Patient not taking: Reported on 10/25/2023   Awilda Bogus, MD Not Taking Active Self, Pharmacy Records,  Other  pantoprazole  (PROTONIX ) 40 MG tablet [id number] Yes Take 1 tablet (40 mg total) by mouth 2 (two) times daily before a meal. Evander Hills, PA-C Taking Active   polyethylene glycol (MIRALAX  / GLYCOLAX ) 17 g packet [id number] No Take 17 g by mouth daily.  Patient not taking: Reported on 12/02/2023   Colin Dawley, MD Not Taking Active Self, Pharmacy Records, Other  RYBELSUS 14 MG TABS [id number] Yes Take 14 mg by mouth daily. [provider] Taking Active Self, Pharmacy Records, Other           Med Note Broadus Canes, Saralyn Cuna Aug 27, 2022  5:30 PM) Pt gets at [facility] office  simethicone  (MYLICON) 80 MG chewable tablet [id number] No Chew 0.5 tablets (40 mg total) by mouth every 6 (six) hours as needed for flatulence (bloating).  Patient  not taking: Reported on 12/02/2023   Awilda Bogus, MD Not Taking Active Self, Pharmacy Records, Other  SURE COMFORT INSULIN  SYRINGE 31G X 5/16" 1 ML MISC [id number] Yes  [provider] Taking Active Self, Pharmacy Records, Other  traMADol  (ULTRAM ) 50 MG tablet [id number] No Take 1 tablet (50 mg total) by mouth every 6 (six) hours as needed for severe pain.  Patient not taking: Reported on 10/25/2023   Awilda Bogus, MD Not Taking Active Self, Pharmacy Records, Other              Assessment/Plan:   Diabetes: - Currently controlled, with A1c of 6.6% on 11/25/23 - Reviewed long term cardiovascular and renal outcomes of uncontrolled blood sugar - Reviewed goal A1c, goal fasting, and goal 2 hour post prandial glucose - Reviewed dietary modifications including eating three small meals per day, increasing vegetable intake, reducing carbohydrate intake - Recommend to check glucose twice daily, will assist in obtaining a new glucometer. - Meets financial criteria for Rybelsus and insulin  patient assistance program through [employer]. Will collaborate with provider, CPhT, and patient to pursue assistance.     Follow Up Plan: Will follow up with patient in 2 weeks regarding PAP. Pt has an upcoming PCP appointment on 12/23/23.  Livia Riffle, PharmD Clinical Pharmacist  [phone]

## 2023-12-12 ENCOUNTER — Ambulatory Visit: Payer: Medicare Other | Admitting: Orthopedic Surgery

## 2023-12-12 DIAGNOSIS — M5416 Radiculopathy, lumbar region: Secondary | ICD-10-CM

## 2023-12-12 MED ORDER — DICLOFENAC SODIUM 50 MG PO TBEC: 50.0000 mg | DELAYED_RELEASE_TABLET | Freq: Two times a day (BID) | ORAL | 0 refills | Status: DC | PRN

## 2023-12-12 MED ORDER — GABAPENTIN 300 MG PO CAPS: 300.0000 mg | ORAL_CAPSULE | Freq: Three times a day (TID) | ORAL | 2 refills | Status: AC

## 2023-12-12 NOTE — Progress Notes (Signed)
 Orthopedic Spine Surgery Office Note   Assessment: Patient is a 73 y.o. female s/p T10-S1 fusion with Dr. Otelia Sergeant who has some chronic low back pain that is under control with current medications     Plan: -Activity as tolerated, no spine specific precautions -Refilled her gabapentin and prescribed diclofenac -Since she has had success with PT in the past, new referral provided to her today -Patient should return to office on an as-needed basis   Patient expressed understanding of the plan and all questions were answered to the patient's satisfaction.    ___________________________________________________________________________     History:   Patient is a 73 y.o. female who presents today for follow-up on her lumbar spine. Patient had undergone several lumbar surgeries with Dr. Otelia Sergeant. Her current construct is from T10-S1.  Patient states that she has had right leg pain for over a year now.  She states that she feels that on the anterior and posterior aspect of the thigh and leg to the level of the ankle.  She is not having any pain radiating into the left lower extremity.  She has been using gabapentin but is not noticing any relief with that.  She is still having low back pain as well.  She has not noticed any weakness in her legs.  No bowel or bladder incontinence.  No saddle anesthesia.   Treatments tried: Meloxicam, Robaxin, gabapentin     Physical Exam:   General: no acute distress, appears stated age Neurologic: alert, answering questions appropriately, following commands Respiratory: unlabored breathing on room air, symmetric chest rise Psychiatric: appropriate affect, normal cadence to speech     MSK (spine):   -Strength exam                                                   Left                  Right EHL                              5/5                  5/5 TA                                 5/5                  5/5 GSC                             5/5                   5/5 Knee extension            5/5                  5/5 Hip flexion                    5/5                  5/5   -Sensory exam  Sensation intact to light touch in L3-S1 nerve distributions of bilateral lower extremities   Imaging: No new imaging obtained at today's visit     Patient name: [name] Patient MRN: [id number] Date of visit: 12/12/23

## 2023-12-19 NOTE — Progress Notes (Signed)
 Referring Provider: Mirna Mires, MD Primary Care Physician:  Mirna Mires, MD Primary GI Physician: Dr. Marletta Lor  Chief Complaint  Patient presents with   Follow-up    Follow up on Dysphagia and GERD. Pt states its getting better    HPI:   [name] is a 73 y.o. female with history of dysphagia s/p empiric dilation in 2020, constipation, GERD, small bowel malrotation. Exploratory laparotomy with lysis of adhesions/reduction of small bowel to the right side of the abdomen and colon to the left side of abdomen (Ladd's procedure), incisional hernia repair with mesh 03/15/21. Ventral hernia repair on 01/18/2022. No prior colonoscopy with patient declining unless absolutely necessary. She is presenting today for follow-up of dysphagia, epigastric and RUQ abdominal pain.  Last seen in the office 10/25/2023.  Reported typical GERD symptoms well-controlled on pantoprazole 40 mg daily.  Had some episodes of dysphagia only occurring with something spicy like barbecue chicken or Doritos and preferred to monitor.  Also with postprandial epigastric and RUQ abdominal pain for the last couple of weeks without nausea or vomiting.  Offered EGD for further evaluation, but patient preferred medication management.  Recommend increasing pantoprazole to 40 mg twice daily and follow-up in 8 weeks.  Today: Reports postprandial epigastric and RUQ abdominal pain as well as dysphagia have resolved.  No heartburn symptoms, nausea, vomiting.  Bowels are moving well without BRBPR or melena.  Her primary concern is about a possible abdominal hernia with associated abdominal pain.  States her PCP has been following this along and she has seen Dr. Henreitta Leber about it previously, but symptoms have been getting worse recently and now with pain.    Past Medical History:  Diagnosis Date   Asthma    Bronchitis    Diabetes mellitus    Hyperlipidemia    Hypertension    Interatrial cardiac shunt    a. small secundum ASD  vs pulmonary shunt.   Low back pain    Migraine    Mild carotid artery disease (HCC)    Osteoarthritis    Sciatica    Varicose veins    Vertigo     Past Surgical History:  Procedure Laterality Date   ABDOMINAL HYSTERECTOMY     BIOPSY  05/12/2019   Procedure: BIOPSY;  Surgeon: West Bali, MD;  Location: [facility];  Service: Endoscopy;;   CARPAL TUNNEL RELEASE Bilateral    CESAREAN SECTION     ESOPHAGOGASTRODUODENOSCOPY (EGD) WITH PROPOFOL N/A 05/12/2019   Procedure: ESOPHAGOGASTRODUODENOSCOPY (EGD) WITH PROPOFOL;  Surgeon: West Bali, MD;  normal-appearing esophagus s/p empiric dilation, moderate gastritis due to ASA s/p biopsy, normal examined duodenum.  Biopsies with gastritis.    EXCISION OF MESH N/A 01/18/2022   Procedure: PARTIAL EXCISION OF PRIOR MESH;  Surgeon: Lucretia Roers, MD;  Location: [facility];  Service: General;  Laterality: N/A;   INCISIONAL HERNIA REPAIR N/A 03/15/2021   Procedure: Linus Mako WITH MESH;  Surgeon: Lucretia Roers, MD;  Location: [facility];  Service: General;  Laterality: N/A;   INCISIONAL HERNIA REPAIR N/A 01/18/2022   Procedure: HERNIA REPAIR INCISIONAL W/ MESH;  Surgeon: Lucretia Roers, MD;  Location: [facility];  Service: General;  Laterality: N/A;   INGUINAL HERNIA REPAIR Left    LAPAROTOMY N/A 03/15/2021   Procedure: EXPLORATORY LAPAROTOMY;  Surgeon: Lucretia Roers, MD;  Location: [facility];  Service: General;  Laterality: N/A;   LUMBAR FUSION     LYSIS OF ADHESION N/A 03/15/2021  Procedure: LYSIS OF ADHESION;  Surgeon: Lucretia Roers, MD;  Location: [facility];  Service: General;  Laterality: N/A;   POSTERIOR LUMBAR FUSION 4 LEVEL N/A 03/04/2018   Procedure: Right transforaminal lumbar interbody fusion L1-2, L2-3 Posterior fusion T10, T11, T12, L1, L2 with T 11, T12 pedicle screws, superior sublaminar hooks T10, local bone graft, allograft cancellous chips Vivigen;  Surgeon: Kerrin Champagne, MD;  Location: [facility];  Service:  Orthopedics;  Laterality: N/A;   SAVORY DILATION N/A 05/12/2019   Procedure: SAVORY DILATION;  Surgeon: West Bali, MD;  [facility]: [facility];  Service: Endoscopy;  Laterality: N/A;    Current Outpatient Medications  Medication Sig Dispense Refill   amLODipine (NORVASC) 10 MG tablet Take 10 mg by mouth daily.     AquaLance Lancets 30G MISC      aspirin EC 81 MG tablet Take 81 mg by mouth daily. Swallow whole.     atorvastatin (LIPITOR) 40 MG tablet Take 40 mg by mouth daily.     diclofenac (VOLTAREN) 50 MG EC tablet Take 1 tablet (50 mg total) by mouth 2 (two) times daily as needed. Take with food and water 60 tablet 0   ezetimibe (ZETIA) 10 MG tablet Take 1 tablet by mouth once daily 90 tablet 3   gabapentin (NEURONTIN) 300 MG capsule Take 1 capsule (300 mg total) by mouth 3 (three) times daily. 90 capsule 2   HUMULIN 70/30 (70-30) 100 UNIT/ML injection Inject 45 Units into the skin 2 (two) times daily with a meal. 10 mL 11   hydrochlorothiazide (HYDRODIURIL) 25 MG tablet Take 25 mg by mouth daily.     insulin aspart protamine- aspart (NOVOLOG MIX 70/30) (70-30) 100 UNIT/ML injection Inject 45 Units into the skin 2 (two) times daily before a meal.     meclizine (ANTIVERT) 25 MG tablet Take 25 mg by mouth 3 (three) times daily as needed. (Patient taking differently: Take 25 mg by mouth 4 (four) times daily.)     metFORMIN (GLUCOPHAGE) 1000 MG tablet Take 1,000 mg by mouth 2 (two) times daily.     metoprolol tartrate (LOPRESSOR) 25 MG tablet Take 1 tablet by mouth twice daily 180 tablet 3   nitroGLYCERIN (NITROSTAT) 0.4 MG SL tablet Place 1 tablet (0.4 mg total) under the tongue every 5 (five) minutes as needed for chest pain. Dissolve one tablet under the tongue every 5 mintues as needed for chest pain. 25 tablet 1   pantoprazole (PROTONIX) 40 MG tablet Take 1 tablet (40 mg total) by mouth 2 (two) times daily before a meal. 60 tablet 3   RYBELSUS 14 MG TABS Take 14 mg by mouth daily.      SURE COMFORT INSULIN SYRINGE 31G X 5/16" 1 ML MISC      traMADol (ULTRAM) 50 MG tablet Take 1 tablet (50 mg total) by mouth every 6 (six) hours as needed for severe pain. 30 tablet 0   acetaminophen (TYLENOL) 500 MG tablet Take 2 tablets (1,000 mg total) by mouth every 8 (eight) hours as needed for mild pain or moderate pain. (Patient not taking: Reported on 12/20/2023) 100 tablet 1   dicyclomine (BENTYL) 10 MG capsule Take 1 capsule (10 mg total) by mouth 4 (four) times daily -  before meals and at bedtime. (Patient not taking: Reported on 12/20/2023) 30 capsule 1   docusate sodium (COLACE) 100 MG capsule Take 100 mg by mouth daily as needed for mild constipation. (Patient not taking: Reported on 12/02/2023)  fluorometholone (FML) 0.1 % ophthalmic suspension Place 1 drop into both eyes daily as needed (irritation). (Patient not taking: Reported on 12/02/2023)     magnesium hydroxide (MILK OF MAGNESIA) 400 MG/5ML suspension Take 30 mLs by mouth daily as needed for mild constipation or moderate constipation. (Patient not taking: Reported on 12/02/2023) 355 mL 0   methocarbamol (ROBAXIN) 500 MG tablet Take 1 tablet (500 mg total) by mouth 4 (four) times daily. (Patient not taking: Reported on 12/20/2023) 50 tablet 4   ondansetron (ZOFRAN-ODT) 4 MG disintegrating tablet Take 1 tablet (4 mg total) by mouth every 6 (six) hours as needed for nausea. (Patient not taking: Reported on 10/25/2023) 20 tablet 0   polyethylene glycol (MIRALAX / GLYCOLAX) 17 g packet Take 17 g by mouth daily. (Patient not taking: Reported on 12/02/2023) 30 each 1   simethicone (MYLICON) 80 MG chewable tablet Chew 0.5 tablets (40 mg total) by mouth every 6 (six) hours as needed for flatulence (bloating). (Patient not taking: Reported on 12/02/2023) 30 tablet 0   No current facility-administered medications for this visit.    Allergies as of 12/20/2023   (No Known Allergies)    Family History  Problem Relation Age of Onset   Lung  cancer Mother    Breast cancer Mother    Stroke Father    Heart failure Sister    Diabetes Sister    Heart failure Sister    Diabetes Sister    Colon cancer Neg Hx    Gastric cancer Neg Hx    Esophageal cancer Neg Hx     Social History   Socioeconomic History   Marital status: Single    Spouse name: Not on file   Number of children: Not on file   Years of education: 12th    Highest education level: Not on file  Occupational History   Occupation: Disabled    Employer: UNEMPLOYED  Tobacco Use   Smoking status: Never   Smokeless tobacco: Never  Vaping Use   Vaping status: Never Used  Substance and Sexual Activity   Alcohol use: No    Alcohol/week: 0.0 standard drinks of alcohol   Drug use: No   Sexual activity: Yes    Birth control/protection: Surgical  Other Topics Concern   Not on file  Social History Narrative   No caffeine use. 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 2 CHILDREN: 1 IN [place], 1 IN MILITARY CURRENTLY STATIONED IN [place].   Social Drivers of Corporate investment banker Strain: Not on file  Food Insecurity: Not on file  Transportation Needs: Not on file  Physical Activity: Not on file  Stress: Not on file  Social Connections: Not on file    Review of Systems: Gen: Denies fever, chills, cold or flu like symptoms, presyncope, syncope. CV: Denies chest pain, palpitations. Resp: Denies dyspnea, cough. GI: See HPI Heme: See HPI  Physical Exam: BP (!) 146/69   Pulse 79   Temp 98.7 F (37.1 C)   Ht 5\' 5"  (1.651 m)   Wt 177 lb 6.4 oz (80.5 kg)   BMI 29.52 kg/m  General:   Alert and oriented. No distress noted. Pleasant and cooperative.  Head:  Normocephalic and atraumatic. Eyes:  Conjuctiva clear without scleral icterus. Heart:  S1, S2 present without murmurs appreciated. Lungs:  Clear to auscultation bilaterally. No wheezes, rales, or rhonchi. No distress.  Abdomen:  +BS, soft.  Just to the right of  midline, patient has a fairly large  abdominal wall bulge with associated tenderness.  I am able to reduce this bulge completely and it does feel like she has a small defect close to her ventral abdominal wall incision.  Also with mild tenderness in the right mid abdomen and right upper quadrant.  No rebound or guarding.  Msk:  Symmetrical without gross deformities. Normal posture. Extremities:  Without edema. Neurologic:  Alert and  oriented x4 Psych:  Normal mood and affect.    Assessment:  73 year old female with history of dysphagia s/p empiric dilation in 2020, constipation, GERD, small bowel malrotation s/p exploratory laparotomy with lysis of adhesions/reduction of small bowel to the right side of the abdomen and colon to the left side of the abdomen (Ladd's procedure),  incisional hernia repair with mesh 03/15/21. Ventral hernia repair on 01/18/2022. No prior colonoscopy with patient declining. She is presenting today for follow-up of dysphagia and postprandial RUQ and epigastric abdominal pain.  Postprandial epigastric/RUQ abdominal pain: Resolved with increasing pantoprazole to 40 mg twice daily.    Dysphagia: Resolved with increasing pantoprazole 40 mg twice daily.  Abdominal wall bulge/abdominal pain: Suspect ventral abdominal wall hernia/incisional hernia.  Prior CT in August 2024 due to suspected hernia in the same area, but found to have ventral wall laxity containing small and large bowel.  She did see general surgery at that time.  No surgical indication.  However, now, patient reports symptoms are worsening and has associated abdominal pain, so we will update a CT scan at this time for reevaluation/rule out development of true hernia.   Plan:  CT A/P with contrast ASAP Continue pantoprazole 40 mg twice daily For the medications follow CT.   Ermalinda Memos, PA-C [facility] 12/20/2023

## 2023-12-20 ENCOUNTER — Ambulatory Visit: Payer: Medicare Other | Admitting: Gastroenterology

## 2023-12-20 ENCOUNTER — Telehealth: Payer: Self-pay | Admitting: *Deleted

## 2023-12-20 ENCOUNTER — Encounter: Payer: Self-pay | Admitting: Gastroenterology

## 2023-12-20 VITALS — BP 146/69 | HR 79 | Temp 98.7°F | Ht 65.0 in | Wt 177.4 lb

## 2023-12-20 DIAGNOSIS — K219 Gastro-esophageal reflux disease without esophagitis: Secondary | ICD-10-CM

## 2023-12-20 DIAGNOSIS — R1900 Intra-abdominal and pelvic swelling, mass and lump, unspecified site: Secondary | ICD-10-CM | POA: Diagnosis not present

## 2023-12-20 DIAGNOSIS — R1310 Dysphagia, unspecified: Secondary | ICD-10-CM

## 2023-12-20 NOTE — Telephone Encounter (Signed)
 CT scan scheduled for 3/12, arrival 7:45am  Called pt, no answer

## 2023-12-20 NOTE — Patient Instructions (Signed)
 We will get you scheduled for a CT of your abdomen and pelvis ASAP to further evaluate the abdominal wall bulge and abdominal pain.  We will call you with your results and further recommendations.  Continue pantoprazole 40 mg twice a day.  Ermalinda Memos, PA-C [facility]

## 2023-12-23 DIAGNOSIS — N1832 Chronic kidney disease, stage 3b: Secondary | ICD-10-CM | POA: Diagnosis not present

## 2023-12-23 DIAGNOSIS — E7800 Pure hypercholesterolemia, unspecified: Secondary | ICD-10-CM | POA: Diagnosis not present

## 2023-12-23 DIAGNOSIS — I129 Hypertensive chronic kidney disease with stage 1 through stage 4 chronic kidney disease, or unspecified chronic kidney disease: Secondary | ICD-10-CM | POA: Diagnosis not present

## 2023-12-23 DIAGNOSIS — E1165 Type 2 diabetes mellitus with hyperglycemia: Secondary | ICD-10-CM | POA: Diagnosis not present

## 2023-12-23 NOTE — Telephone Encounter (Signed)
 Spoke with pt and she is aware of her CT appt details. She voiced understanding

## 2023-12-24 DIAGNOSIS — M549 Dorsalgia, unspecified: Secondary | ICD-10-CM | POA: Diagnosis not present

## 2023-12-24 DIAGNOSIS — R2689 Other abnormalities of gait and mobility: Secondary | ICD-10-CM | POA: Diagnosis not present

## 2023-12-24 DIAGNOSIS — M6281 Muscle weakness (generalized): Secondary | ICD-10-CM | POA: Diagnosis not present

## 2023-12-25 ENCOUNTER — Ambulatory Visit (HOSPITAL_COMMUNITY)
Admission: RE | Admit: 2023-12-25 | Discharge: 2023-12-25 | Disposition: A | Discharge status: Home / Self Care | Source: Ambulatory Visit | Attending: Gastroenterology | Admitting: Gastroenterology

## 2023-12-25 DIAGNOSIS — R109 Unspecified abdominal pain: Secondary | ICD-10-CM | POA: Diagnosis not present

## 2023-12-25 DIAGNOSIS — Z9049 Acquired absence of other specified parts of digestive tract: Secondary | ICD-10-CM | POA: Diagnosis not present

## 2023-12-25 DIAGNOSIS — K439 Ventral hernia without obstruction or gangrene: Secondary | ICD-10-CM | POA: Diagnosis not present

## 2023-12-25 DIAGNOSIS — R1900 Intra-abdominal and pelvic swelling, mass and lump, unspecified site: Secondary | ICD-10-CM | POA: Diagnosis not present

## 2023-12-25 LAB — POCT I-STAT CREATININE: Creatinine, Ser: 1 mg/dL (ref 0.44–1.00)

## 2023-12-25 MED ORDER — IOHEXOL 300 MG/ML  SOLN
100.0000 mL | Freq: Once | INTRAMUSCULAR | Status: AC | PRN
  Administered 2023-12-25: 100 mL via INTRAVENOUS

## 2023-12-27 DIAGNOSIS — R2689 Other abnormalities of gait and mobility: Secondary | ICD-10-CM | POA: Diagnosis not present

## 2023-12-27 DIAGNOSIS — M6281 Muscle weakness (generalized): Secondary | ICD-10-CM | POA: Diagnosis not present

## 2023-12-27 DIAGNOSIS — M549 Dorsalgia, unspecified: Secondary | ICD-10-CM | POA: Diagnosis not present

## 2023-12-30 DIAGNOSIS — M6281 Muscle weakness (generalized): Secondary | ICD-10-CM | POA: Diagnosis not present

## 2023-12-30 DIAGNOSIS — M549 Dorsalgia, unspecified: Secondary | ICD-10-CM | POA: Diagnosis not present

## 2023-12-30 DIAGNOSIS — R2689 Other abnormalities of gait and mobility: Secondary | ICD-10-CM | POA: Diagnosis not present

## 2024-01-01 DIAGNOSIS — M6281 Muscle weakness (generalized): Secondary | ICD-10-CM | POA: Diagnosis not present

## 2024-01-01 DIAGNOSIS — M549 Dorsalgia, unspecified: Secondary | ICD-10-CM | POA: Diagnosis not present

## 2024-01-01 DIAGNOSIS — R2689 Other abnormalities of gait and mobility: Secondary | ICD-10-CM | POA: Diagnosis not present

## 2024-01-07 DIAGNOSIS — M6281 Muscle weakness (generalized): Secondary | ICD-10-CM | POA: Diagnosis not present

## 2024-01-07 DIAGNOSIS — R2689 Other abnormalities of gait and mobility: Secondary | ICD-10-CM | POA: Diagnosis not present

## 2024-01-07 DIAGNOSIS — M549 Dorsalgia, unspecified: Secondary | ICD-10-CM | POA: Diagnosis not present

## 2024-01-09 DIAGNOSIS — R2689 Other abnormalities of gait and mobility: Secondary | ICD-10-CM | POA: Diagnosis not present

## 2024-01-09 DIAGNOSIS — M6281 Muscle weakness (generalized): Secondary | ICD-10-CM | POA: Diagnosis not present

## 2024-01-09 DIAGNOSIS — M549 Dorsalgia, unspecified: Secondary | ICD-10-CM | POA: Diagnosis not present

## 2024-01-14 ENCOUNTER — Other Ambulatory Visit: Payer: Self-pay | Admitting: *Deleted

## 2024-01-14 DIAGNOSIS — M549 Dorsalgia, unspecified: Secondary | ICD-10-CM | POA: Diagnosis not present

## 2024-01-14 DIAGNOSIS — R1013 Epigastric pain: Secondary | ICD-10-CM

## 2024-01-14 DIAGNOSIS — R9389 Abnormal findings on diagnostic imaging of other specified body structures: Secondary | ICD-10-CM

## 2024-01-14 DIAGNOSIS — R2689 Other abnormalities of gait and mobility: Secondary | ICD-10-CM | POA: Diagnosis not present

## 2024-01-14 DIAGNOSIS — M6281 Muscle weakness (generalized): Secondary | ICD-10-CM | POA: Diagnosis not present

## 2024-01-20 ENCOUNTER — Other Ambulatory Visit: Payer: Self-pay | Admitting: Orthopedic Surgery

## 2024-01-20 ENCOUNTER — Other Ambulatory Visit: Payer: Self-pay | Admitting: Gastroenterology

## 2024-01-20 DIAGNOSIS — K219 Gastro-esophageal reflux disease without esophagitis: Secondary | ICD-10-CM

## 2024-01-20 DIAGNOSIS — R2689 Other abnormalities of gait and mobility: Secondary | ICD-10-CM | POA: Diagnosis not present

## 2024-01-20 DIAGNOSIS — M549 Dorsalgia, unspecified: Secondary | ICD-10-CM | POA: Diagnosis not present

## 2024-01-20 DIAGNOSIS — M6281 Muscle weakness (generalized): Secondary | ICD-10-CM | POA: Diagnosis not present

## 2024-01-22 DIAGNOSIS — M549 Dorsalgia, unspecified: Secondary | ICD-10-CM | POA: Diagnosis not present

## 2024-01-22 DIAGNOSIS — M6281 Muscle weakness (generalized): Secondary | ICD-10-CM | POA: Diagnosis not present

## 2024-01-22 DIAGNOSIS — R2689 Other abnormalities of gait and mobility: Secondary | ICD-10-CM | POA: Diagnosis not present

## 2024-01-27 DIAGNOSIS — M549 Dorsalgia, unspecified: Secondary | ICD-10-CM | POA: Diagnosis not present

## 2024-01-27 DIAGNOSIS — R2689 Other abnormalities of gait and mobility: Secondary | ICD-10-CM | POA: Diagnosis not present

## 2024-01-27 DIAGNOSIS — M6281 Muscle weakness (generalized): Secondary | ICD-10-CM | POA: Diagnosis not present

## 2024-01-29 DIAGNOSIS — R2689 Other abnormalities of gait and mobility: Secondary | ICD-10-CM | POA: Diagnosis not present

## 2024-01-29 DIAGNOSIS — M549 Dorsalgia, unspecified: Secondary | ICD-10-CM | POA: Diagnosis not present

## 2024-01-29 DIAGNOSIS — M6281 Muscle weakness (generalized): Secondary | ICD-10-CM | POA: Diagnosis not present

## 2024-01-30 ENCOUNTER — Telehealth: Payer: Self-pay

## 2024-01-30 NOTE — Progress Notes (Signed)
   01/30/2024  Patient ID: [name], female   DOB: September 04, 1951, 73 y.o.   MRN: [id number]  Contacted patient regarding follow up for diabetes and medication access from quality report. PCP is Wilburn Handler, MD, will collaborate for patient assistance program (PAP) .    Left patient a voicemail to return my call at their convenience  Livia Riffle, PharmD Clinical Pharmacist  [phone]

## 2024-02-06 ENCOUNTER — Telehealth: Payer: Self-pay

## 2024-02-06 NOTE — Progress Notes (Signed)
   02/06/2024  Patient ID: [name], female   DOB: 02-Dec-1950, 73 y.o.   MRN: [id number]    2025 Medication Assistance Renewal Application Summary:  Patient was outreached regarding medication assistance renewal for 2025. Verified address, anticipated insurance for 2025, and income has not changed. Patient remains interested in PAP for 2025 for Rybelsus and Novolin 70/30, no other new medications were identified for medication assistance.    Medication Assistance Findings:  Medication assistance needs identified: Rybelsus and Novolin 70/30     Additional medication assistance options reviewed with patient as warranted:  No other options identified  Plan: I will route patient assistance letter to pharmacy technician who will coordinate patient assistance program application process for medications listed above.  Pharmacy technician will assist with obtaining all required documents from both patient and provider(s) and submit application(s) once completed.    Thank you for allowing pharmacy to be a part of this patient's care.  Livia Riffle, PharmD Clinical Pharmacist  [phone]

## 2024-02-06 NOTE — Progress Notes (Signed)
   02/06/2024  Patient ID: [name], female   DOB: [birth year], 73 y.o.   MRN: [id number]  Contacted patient regarding follow up for diabetes and medication access from quality report. PCP is Wilburn Handler, MD, will collaborate for patient assistance program (PAP) .    Left patient a voicemail to return my call at their convenience  Livia Riffle, PharmD Clinical Pharmacist  [phone]

## 2024-02-13 NOTE — Progress Notes (Signed)
   02/13/2024  Patient ID: [name], female   DOB: January 07, 1951, 73 y.o.   MRN: [id number]   Spoke to patient, she has an upcoming appointment on 02/17/24, patient will bring income documents with her and sign PAP applications at that time.   I will coordinate with nurse and PCP to get provider portion signed.   Livia Riffle, PharmD Clinical Pharmacist  [phone]

## 2024-02-17 DIAGNOSIS — I10 Essential (primary) hypertension: Secondary | ICD-10-CM | POA: Diagnosis not present

## 2024-02-17 DIAGNOSIS — I129 Hypertensive chronic kidney disease with stage 1 through stage 4 chronic kidney disease, or unspecified chronic kidney disease: Secondary | ICD-10-CM | POA: Diagnosis not present

## 2024-02-17 DIAGNOSIS — N1832 Chronic kidney disease, stage 3b: Secondary | ICD-10-CM | POA: Diagnosis not present

## 2024-02-17 DIAGNOSIS — E1165 Type 2 diabetes mellitus with hyperglycemia: Secondary | ICD-10-CM | POA: Diagnosis not present

## 2024-02-17 DIAGNOSIS — K432 Incisional hernia without obstruction or gangrene: Secondary | ICD-10-CM | POA: Diagnosis not present

## 2024-02-20 ENCOUNTER — Ambulatory Visit: Admitting: General Surgery

## 2024-02-20 ENCOUNTER — Encounter: Payer: Self-pay | Admitting: General Surgery

## 2024-02-20 VITALS — BP 135/75 | HR 65 | Temp 98.0°F | Resp 12 | Ht 65.0 in | Wt 175.0 lb

## 2024-02-20 DIAGNOSIS — K432 Incisional hernia without obstruction or gangrene: Secondary | ICD-10-CM | POA: Diagnosis not present

## 2024-02-20 NOTE — Progress Notes (Signed)
   02/20/2024  Patient ID: [name], female   DOB: [birth year], 73 y.o.   MRN: [id number]   Left HIPAA compliant voicemail for patient. Received signed patient and provider portions of PAP for Rybelsus and Novolin 70/30. Will fax to [employer], CPhT for submitting PAP to [employer].   We will collaborate with the patient to keep her updated on PAP approval status.   Livia Riffle, PharmD Clinical Pharmacist  [phone]

## 2024-02-20 NOTE — Progress Notes (Signed)
 [name] History and Physical  Reason for Referral: Ventral hernia  Referring Physician:  Shana Daring PA GI     [name] is a 73 y.o. female.  HPI:    [name] is known to me s/p Ex lap for malrotation with Ladd's procedure and incisional hernia repair with mesh in 2022 and then recurrent incisional hernia repair with mesh in 2023. She has a thinner abdominal wall and Ct scan in the past with concern for some protrusion but no obvious hernia as the mesh was in place. This past CT scan in March 2025 does demonstrate concern for small bowel under a flap of rectus versus mesh. She is having some pain in the right abdomen and was evaluated by [name] who got in touch with me about the patient.   She is here today with her family. She is also constipated right now and has not had a BM in a week. She is passing flatus. She is not having any nausea or vomiting. She has had issues with constipation before the malrotation surgery but that had resolved after that surgery.   Past Medical History:  Diagnosis Date   Asthma    Bronchitis    Diabetes mellitus    Hyperlipidemia    Hypertension    Interatrial cardiac shunt    a. small secundum ASD vs pulmonary shunt.   Low back pain    Migraine    Mild carotid artery disease (HCC)    Osteoarthritis    Sciatica    Varicose veins    Vertigo     Past Surgical History:  Procedure Laterality Date   ABDOMINAL HYSTERECTOMY     BIOPSY  05/12/2019   Procedure: BIOPSY;  Surgeon: Angela Jubilee, MD;  Location: [facility];  Service: Endoscopy;;   CARPAL TUNNEL RELEASE Bilateral    CESAREAN SECTION     ESOPHAGOGASTRODUODENOSCOPY (EGD) WITH PROPOFOL  N/A 05/12/2019   Procedure: ESOPHAGOGASTRODUODENOSCOPY (EGD) WITH PROPOFOL ;  Surgeon: Angela Jubilee, MD;  normal-appearing esophagus s/p empiric dilation, moderate gastritis due to ASA s/p biopsy, normal examined duodenum.  Biopsies with gastritis.    EXCISION  OF MESH N/A 01/18/2022   Procedure: PARTIAL EXCISION OF PRIOR MESH;  Surgeon: Angela Bogus, MD;  Location: [facility];  Service: General;  Laterality: N/A;   INCISIONAL HERNIA REPAIR N/A 03/15/2021   Procedure: [name] WITH MESH;  Surgeon: Angela Bogus, MD;  Location: [facility];  Service: General;  Laterality: N/A;   INCISIONAL HERNIA REPAIR N/A 01/18/2022   Procedure: HERNIA REPAIR INCISIONAL W/ MESH;  Surgeon: Angela Bogus, MD;  Location: [facility];  Service: General;  Laterality: N/A;   INGUINAL HERNIA REPAIR Left    LAPAROTOMY N/A 03/15/2021   Procedure: EXPLORATORY LAPAROTOMY;  Surgeon: Angela Bogus, MD;  Location: [facility];  Service: General;  Laterality: N/A;   LUMBAR FUSION     LYSIS OF ADHESION N/A 03/15/2021   Procedure: LYSIS OF ADHESION;  Surgeon: Angela Bogus, MD;  Location: [facility];  Service: General;  Laterality: N/A;   POSTERIOR LUMBAR FUSION 4 LEVEL N/A 03/04/2018   Procedure: Right transforaminal lumbar interbody fusion L1-2, L2-3 Posterior fusion T10, T11, T12, L1, L2 with T 11, T12 pedicle screws, superior sublaminar hooks T10, local bone graft, allograft cancellous chips Vivigen;  Surgeon: Angela Jean, MD;  Location: [facility];  Service: Orthopedics;  Laterality: N/A;   SAVORY DILATION N/A 05/12/2019   Procedure: SAVORY DILATION;  Surgeon: [name],  Angela Buckler, MD;  Location: [facility];  Service: Endoscopy;  Laterality: N/A;    Family History  Problem Relation Age of Onset   Lung cancer Mother    Breast cancer Mother    Stroke Father    Heart failure Sister    Diabetes Sister    Heart failure Sister    Diabetes Sister    Colon cancer Neg Hx    Gastric cancer Neg Hx    Esophageal cancer Neg Hx     Social History   Tobacco Use   Smoking status: Never   Smokeless tobacco: Never  Vaping Use   Vaping status: Never Used  Substance Use Topics   Alcohol use: No    Alcohol/week: 0.0 standard drinks of alcohol   Drug use: No    Medications:  I have reviewed the patient's current medications. Allergies as of 02/20/2024   No Known Allergies      Medication List        Accurate as of Feb 20, 2024  1:56 PM. If you have any questions, ask your nurse or doctor.          acetaminophen  500 MG tablet Commonly known as: TYLENOL  Take 2 tablets (1,000 mg total) by mouth every 8 (eight) hours as needed for mild pain or moderate pain.   amLODipine  10 MG tablet Commonly known as: NORVASC  Take 10 mg by mouth daily.   AquaLance Lancets 30G Misc   aspirin  EC 81 MG tablet Take 81 mg by mouth daily. Swallow whole.   atorvastatin  40 MG tablet Commonly known as: LIPITOR Take 40 mg by mouth daily.   diclofenac  50 MG EC tablet Commonly known as: VOLTAREN  TAKE 1 TABLET BY MOUTH TWICE DAILY AS NEEDED. TAKE WITH FOOD AND WATER   dicyclomine  10 MG capsule Commonly known as: BENTYL  Take 1 capsule (10 mg total) by mouth 4 (four) times daily -  before meals and at bedtime.   docusate sodium  100 MG capsule Commonly known as: COLACE Take 100 mg by mouth daily as needed for mild constipation.   ezetimibe  10 MG tablet Commonly known as: ZETIA  Take 1 tablet by mouth once daily   Farxiga 10 MG Tabs tablet Generic drug: dapagliflozin propanediol Take 10 mg by mouth daily.   fluorometholone  0.1 % ophthalmic suspension Commonly known as: FML Place 1 drop into both eyes daily as needed (irritation).   gabapentin  300 MG capsule Commonly known as: NEURONTIN  Take 1 capsule (300 mg total) by mouth 3 (three) times daily.   HumuLIN  70/30 (70-30) 100 UNIT/ML injection Generic drug: insulin  NPH-regular Human Inject 45 Units into the skin 2 (two) times daily with a meal.   hydrochlorothiazide  25 MG tablet Commonly known as: HYDRODIURIL  Take 25 mg by mouth daily.   insulin  aspart protamine- aspart (70-30) 100 UNIT/ML injection Commonly known as: NOVOLOG  MIX 70/30 Inject 45 Units into the skin 2 (two) times daily before a meal.    magnesium  hydroxide 400 MG/5ML suspension Commonly known as: MILK OF MAGNESIA Take 30 mLs by mouth daily as needed for mild constipation or moderate constipation.   meclizine 25 MG tablet Commonly known as: ANTIVERT Take 25 mg by mouth 3 (three) times daily as needed. What changed: when to take this   meloxicam  15 MG tablet Commonly known as: MOBIC  Take 15 mg by mouth every morning.   metFORMIN  1000 MG tablet Commonly known as: GLUCOPHAGE  Take 1,000 mg by mouth 2 (two) times daily.   methocarbamol  500 MG  tablet Commonly known as: ROBAXIN  Take 1 tablet (500 mg total) by mouth 4 (four) times daily.   metoprolol  tartrate 25 MG tablet Commonly known as: LOPRESSOR  Take 1 tablet by mouth twice daily   nitroGLYCERIN  0.4 MG SL tablet Commonly known as: NITROSTAT  Place 1 tablet (0.4 mg total) under the tongue every 5 (five) minutes as needed for chest pain. Dissolve one tablet under the tongue every 5 mintues as needed for chest pain.   ondansetron  4 MG disintegrating tablet Commonly known as: ZOFRAN -ODT Take 1 tablet (4 mg total) by mouth every 6 (six) hours as needed for nausea.   pantoprazole  40 MG tablet Commonly known as: PROTONIX  Take 1 tablet (40 mg total) by mouth 2 (two) times daily before a meal.   polyethylene glycol 17 g packet Commonly known as: MIRALAX  / GLYCOLAX  Take 17 g by mouth daily.   Rybelsus 14 MG Tabs Generic drug: Semaglutide Take 14 mg by mouth daily.   simethicone  80 MG chewable tablet Commonly known as: MYLICON Chew 0.5 tablets (40 mg total) by mouth every 6 (six) hours as needed for flatulence (bloating).   Sure Comfort Insulin  Syringe 31G X 5/16" 1 ML Misc Generic drug: Insulin  Syringe-Needle U-100   traMADol  50 MG tablet Commonly known as: ULTRAM  Take 1 tablet (50 mg total) by mouth every 6 (six) hours as needed for severe pain.         ROS:  A comprehensive review of systems was negative except for: Gastrointestinal: positive for  abdominal pain, constipation, and nausea  Blood pressure 135/75, pulse 65, temperature 98 F (36.7 C), temperature source Oral, resp. rate 12, height 5\' 5"  (1.651 m), weight 175 lb (79.4 kg), SpO2 94%. Physical Exam Vitals reviewed.  HENT:     Head: Normocephalic.     Nose: Nose normal.  Eyes:     Pupils: Pupils are equal, round, and reactive to light.  Cardiovascular:     Rate and Rhythm: Normal rate and regular rhythm.  Pulmonary:     Effort: Pulmonary effort is normal.     Breath sounds: Normal breath sounds.  Abdominal:     General: There is no distension.     Palpations: Abdomen is soft.     Tenderness: There is abdominal tenderness.     Hernia: A hernia is present.     Comments: Right of midline reducible hernia   Musculoskeletal:        General: Normal range of motion.     Cervical back: Normal range of motion.  Skin:    General: Skin is warm.  Neurological:     General: No focal deficit present.     Mental Status: She is alert and oriented to person, place, and time.  Psychiatric:        Mood and Affect: Mood normal.        Behavior: Behavior normal.     Results: personally reviewed and showed patient and family- hernia with concern for flap of rectus or even mesh with small bowel into this tunnel  CLINICAL DATA:  Ventral abdominal hernia with associated abdominal pain.   EXAM: CT ABDOMEN AND PELVIS WITH CONTRAST   TECHNIQUE: Multidetector CT imaging of the abdomen and pelvis was performed using the standard protocol following bolus administration of intravenous contrast.   RADIATION DOSE REDUCTION: This exam was performed according to the departmental dose-optimization program which includes automated exposure control, adjustment of the mA and/or kV according to patient size and/or use of iterative reconstruction technique.  CONTRAST:  OMNIPAQUE  IOHEXOL  300 MG/ML  SOLN   COMPARISON:  06/11/2023   FINDINGS: Lower chest: No acute abnormality.    Hepatobiliary: No focal liver abnormality is seen. Status post cholecystectomy. Common bile duct measures up to 10 mm on coronal reconstructions, stable post cholecystectomy.   Pancreas: Unremarkable. No pancreatic ductal dilatation or surrounding inflammatory changes.   Spleen: Normal in size without focal abnormality.   Adrenals/Urinary Tract: Adrenal glands are unremarkable. Kidneys are normal, without renal calculi, focal lesion, or hydronephrosis. Bladder is unremarkable.   Stomach/Bowel: Bowel shows no evidence of obstruction, ileus, inflammation or lesion. The appendix is not discretely visualized. No free intraperitoneal air.   Vascular/Lymphatic: Stable mild atherosclerosis of the distal abdominal aorta. No aneurysm or dissection. No lymphadenopathy identified.   Reproductive: Status post hysterectomy. No adnexal masses.   Other: Stable morphology of generalized laxity of the midline ventral abdominal wall roughly centered at the level of the umbilicus. There also is stable morphology of a right-sided disruption/gap in the transversalis fascia measuring approximately 3 cm in diameter on axial images and just over 3 cm in height on coronal images with interposition of a C-shaped loop of small bowel through the defect into the right rectus sheath. The inferior aspect of this loop of small bowel appears subjectively thickened compared to the prior CT but without otherwise surrounding inflammation or fluid.   Musculoskeletal: Stable appearance of extensive prior posterior lumbar fusion. No interval lumbar fracture or subluxation.   IMPRESSION: 1. Stable morphology of generalized laxity of the midline ventral abdominal wall roughly centered at the level of the umbilicus. There also is stable morphology of a right-sided disruption/gap in the transversalis fascia measuring approximately 3 cm in diameter on axial images and just over 3 cm in height on coronal images  with interposition of a C-shaped loop of small bowel through the defect into the right rectus sheath. The inferior aspect of this loop of small bowel appears subjectively thickened compared to the prior CT but without otherwise surrounding inflammation or fluid. No evidence to suggest associated small-bowel incarceration or obstruction. 2. Stable mild atherosclerosis of the distal abdominal aorta. 3. Status post cholecystectomy and hysterectomy. 4. Stable appearance of extensive prior posterior lumbar fusion.     Electronically Signed   By: Erica Hau M.D.   On: 01/11/2024 12:01  Assessment and Plan:  NIKO AUMENT is a 73 y.o. female with recurrent hernia on the right side of her abdomen.  This is where her prior recurrence was too and I performed debridement and had to place another mesh. This was before we have access to the robot. Discussed with patient and family option of robotic assisted ventral hernia repair with mesh versus open pending my ability to access her abdomen laparoscopically. Discussed risk of bleeding, infection, injury to bowel, recurrence, and fact that she has recurred in the past. She wants to proceed. Plan to stay overnight.   All questions were answered to the satisfaction of the patient and family.    [name] 02/20/2024, 1:56 PM

## 2024-02-21 ENCOUNTER — Telehealth: Payer: Self-pay

## 2024-02-21 NOTE — H&P (Signed)
 [facility] History and Physical  Reason for Referral: Ventral hernia  Referring Physician:  Shana Daring PA GI     [name] is a 73 y.o. female.  HPI:    [name] is known to me s/p Ex lap for malrotation with Ladd's procedure and incisional hernia repair with mesh in 2022 and then recurrent incisional hernia repair with mesh in 2023. She has a thinner abdominal wall and Ct scan in the past with concern for some protrusion but no obvious hernia as the mesh was in place. This past CT scan in March 2025 does demonstrate concern for small bowel under a flap of rectus versus mesh. She is having some pain in the right abdomen and was evaluated by Ms. April Knack who got in touch with me about the patient.   She is here today with her family. She is also constipated right now and has not had a BM in a week. She is passing flatus. She is not having any nausea or vomiting. She has had issues with constipation before the malrotation surgery but that had resolved after that surgery.   Past Medical History:  Diagnosis Date   Asthma    Bronchitis    Diabetes mellitus    Hyperlipidemia    Hypertension    Interatrial cardiac shunt    a. small secundum ASD vs pulmonary shunt.   Low back pain    Migraine    Mild carotid artery disease (HCC)    Osteoarthritis    Sciatica    Varicose veins    Vertigo     Past Surgical History:  Procedure Laterality Date   ABDOMINAL HYSTERECTOMY     BIOPSY  05/12/2019   Procedure: BIOPSY;  Surgeon: Angela Jubilee, MD;  Location: [facility];  Service: Endoscopy;;   CARPAL TUNNEL RELEASE Bilateral    CESAREAN SECTION     ESOPHAGOGASTRODUODENOSCOPY (EGD) WITH PROPOFOL  N/A 05/12/2019   Procedure: ESOPHAGOGASTRODUODENOSCOPY (EGD) WITH PROPOFOL ;  Surgeon: Angela Jubilee, MD;  normal-appearing esophagus s/p empiric dilation, moderate gastritis due to ASA s/p biopsy, normal examined duodenum.  Biopsies with gastritis.    EXCISION  OF MESH N/A 01/18/2022   Procedure: PARTIAL EXCISION OF PRIOR MESH;  Surgeon: Awilda Bogus, MD;  Location: [facility];  Service: General;  Laterality: N/A;   INCISIONAL HERNIA REPAIR N/A 03/15/2021   Procedure: [name] WITH MESH;  Surgeon: Awilda Bogus, MD;  Location: [facility];  Service: General;  Laterality: N/A;   INCISIONAL HERNIA REPAIR N/A 01/18/2022   Procedure: HERNIA REPAIR INCISIONAL W/ MESH;  Surgeon: Awilda Bogus, MD;  Location: [facility];  Service: General;  Laterality: N/A;   INGUINAL HERNIA REPAIR Left    LAPAROTOMY N/A 03/15/2021   Procedure: EXPLORATORY LAPAROTOMY;  Surgeon: Awilda Bogus, MD;  Location: [facility];  Service: General;  Laterality: N/A;   LUMBAR FUSION     LYSIS OF ADHESION N/A 03/15/2021   Procedure: LYSIS OF ADHESION;  Surgeon: Awilda Bogus, MD;  Location: [facility];  Service: General;  Laterality: N/A;   POSTERIOR LUMBAR FUSION 4 LEVEL N/A 03/04/2018   Procedure: Right transforaminal lumbar interbody fusion L1-2, L2-3 Posterior fusion T10, T11, T12, L1, L2 with T 11, T12 pedicle screws, superior sublaminar hooks T10, local bone graft, allograft cancellous chips Vivigen;  Surgeon: Angela Jean, MD;  Location: [facility];  Service: Orthopedics;  Laterality: N/A;   SAVORY DILATION N/A 05/12/2019   Procedure: SAVORY DILATION;  Surgeon: [name],  Angela Buckler, MD;  Location: [facility];  Service: Endoscopy;  Laterality: N/A;    Family History  Problem Relation Age of Onset   Lung cancer Mother    Breast cancer Mother    Stroke Father    Heart failure Sister    Diabetes Sister    Heart failure Sister    Diabetes Sister    Colon cancer Neg Hx    Gastric cancer Neg Hx    Esophageal cancer Neg Hx     Social History   Tobacco Use   Smoking status: Never   Smokeless tobacco: Never  Vaping Use   Vaping status: Never Used  Substance Use Topics   Alcohol use: No    Alcohol/week: 0.0 standard drinks of alcohol   Drug use: No    Medications:  I have reviewed the patient's current medications. Allergies as of 02/20/2024   No Known Allergies      Medication List        Accurate as of Feb 20, 2024  1:56 PM. If you have any questions, ask your nurse or doctor.          acetaminophen  500 MG tablet Commonly known as: TYLENOL  Take 2 tablets (1,000 mg total) by mouth every 8 (eight) hours as needed for mild pain or moderate pain.   amLODipine  10 MG tablet Commonly known as: NORVASC  Take 10 mg by mouth daily.   AquaLance Lancets 30G Misc   aspirin  EC 81 MG tablet Take 81 mg by mouth daily. Swallow whole.   atorvastatin  40 MG tablet Commonly known as: LIPITOR Take 40 mg by mouth daily.   diclofenac  50 MG EC tablet Commonly known as: VOLTAREN  TAKE 1 TABLET BY MOUTH TWICE DAILY AS NEEDED. TAKE WITH FOOD AND WATER   dicyclomine  10 MG capsule Commonly known as: BENTYL  Take 1 capsule (10 mg total) by mouth 4 (four) times daily -  before meals and at bedtime.   docusate sodium  100 MG capsule Commonly known as: COLACE Take 100 mg by mouth daily as needed for mild constipation.   ezetimibe  10 MG tablet Commonly known as: ZETIA  Take 1 tablet by mouth once daily   Farxiga 10 MG Tabs tablet Generic drug: dapagliflozin propanediol Take 10 mg by mouth daily.   fluorometholone  0.1 % ophthalmic suspension Commonly known as: FML Place 1 drop into both eyes daily as needed (irritation).   gabapentin  300 MG capsule Commonly known as: NEURONTIN  Take 1 capsule (300 mg total) by mouth 3 (three) times daily.   HumuLIN  70/30 (70-30) 100 UNIT/ML injection Generic drug: insulin  NPH-regular Human Inject 45 Units into the skin 2 (two) times daily with a meal.   hydrochlorothiazide  25 MG tablet Commonly known as: HYDRODIURIL  Take 25 mg by mouth daily.   insulin  aspart protamine- aspart (70-30) 100 UNIT/ML injection Commonly known as: NOVOLOG  MIX 70/30 Inject 45 Units into the skin 2 (two) times daily before a meal.    magnesium  hydroxide 400 MG/5ML suspension Commonly known as: MILK OF MAGNESIA Take 30 mLs by mouth daily as needed for mild constipation or moderate constipation.   meclizine 25 MG tablet Commonly known as: ANTIVERT Take 25 mg by mouth 3 (three) times daily as needed. What changed: when to take this   meloxicam  15 MG tablet Commonly known as: MOBIC  Take 15 mg by mouth every morning.   metFORMIN  1000 MG tablet Commonly known as: GLUCOPHAGE  Take 1,000 mg by mouth 2 (two) times daily.   methocarbamol  500 MG  tablet Commonly known as: ROBAXIN  Take 1 tablet (500 mg total) by mouth 4 (four) times daily.   metoprolol  tartrate 25 MG tablet Commonly known as: LOPRESSOR  Take 1 tablet by mouth twice daily   nitroGLYCERIN  0.4 MG SL tablet Commonly known as: NITROSTAT  Place 1 tablet (0.4 mg total) under the tongue every 5 (five) minutes as needed for chest pain. Dissolve one tablet under the tongue every 5 mintues as needed for chest pain.   ondansetron  4 MG disintegrating tablet Commonly known as: ZOFRAN -ODT Take 1 tablet (4 mg total) by mouth every 6 (six) hours as needed for nausea.   pantoprazole  40 MG tablet Commonly known as: PROTONIX  Take 1 tablet (40 mg total) by mouth 2 (two) times daily before a meal.   polyethylene glycol 17 g packet Commonly known as: MIRALAX  / GLYCOLAX  Take 17 g by mouth daily.   Rybelsus 14 MG Tabs Generic drug: Semaglutide Take 14 mg by mouth daily.   simethicone  80 MG chewable tablet Commonly known as: MYLICON Chew 0.5 tablets (40 mg total) by mouth every 6 (six) hours as needed for flatulence (bloating).   Sure Comfort Insulin  Syringe 31G X 5/16" 1 ML Misc Generic drug: Insulin  Syringe-Needle U-100   traMADol  50 MG tablet Commonly known as: ULTRAM  Take 1 tablet (50 mg total) by mouth every 6 (six) hours as needed for severe pain.         ROS:  A comprehensive review of systems was negative except for: Gastrointestinal: positive for  abdominal pain, constipation, and nausea  Blood pressure 135/75, pulse 65, temperature 98 F (36.7 C), temperature source Oral, resp. rate 12, height 5\' 5"  (1.651 m), weight 175 lb (79.4 kg), SpO2 94%. Physical Exam Vitals reviewed.  HENT:     Head: Normocephalic.     Nose: Nose normal.  Eyes:     Pupils: Pupils are equal, round, and reactive to light.  Cardiovascular:     Rate and Rhythm: Normal rate and regular rhythm.  Pulmonary:     Effort: Pulmonary effort is normal.     Breath sounds: Normal breath sounds.  Abdominal:     General: There is no distension.     Palpations: Abdomen is soft.     Tenderness: There is abdominal tenderness.     Hernia: A hernia is present.     Comments: Right of midline reducible hernia   Musculoskeletal:        General: Normal range of motion.     Cervical back: Normal range of motion.  Skin:    General: Skin is warm.  Neurological:     General: No focal deficit present.     Mental Status: She is alert and oriented to person, place, and time.  Psychiatric:        Mood and Affect: Mood normal.        Behavior: Behavior normal.     Results: personally reviewed and showed patient and family- hernia with concern for flap of rectus or even mesh with small bowel into this tunnel  CLINICAL DATA:  Ventral abdominal hernia with associated abdominal pain.   EXAM: CT ABDOMEN AND PELVIS WITH CONTRAST   TECHNIQUE: Multidetector CT imaging of the abdomen and pelvis was performed using the standard protocol following bolus administration of intravenous contrast.   RADIATION DOSE REDUCTION: This exam was performed according to the departmental dose-optimization program which includes automated exposure control, adjustment of the mA and/or kV according to patient size and/or use of iterative reconstruction technique.  CONTRAST:  OMNIPAQUE  IOHEXOL  300 MG/ML  SOLN   COMPARISON:  06/11/2023   FINDINGS: Lower chest: No acute abnormality.    Hepatobiliary: No focal liver abnormality is seen. Status post cholecystectomy. Common bile duct measures up to 10 mm on coronal reconstructions, stable post cholecystectomy.   Pancreas: Unremarkable. No pancreatic ductal dilatation or surrounding inflammatory changes.   Spleen: Normal in size without focal abnormality.   Adrenals/Urinary Tract: Adrenal glands are unremarkable. Kidneys are normal, without renal calculi, focal lesion, or hydronephrosis. Bladder is unremarkable.   Stomach/Bowel: Bowel shows no evidence of obstruction, ileus, inflammation or lesion. The appendix is not discretely visualized. No free intraperitoneal air.   Vascular/Lymphatic: Stable mild atherosclerosis of the distal abdominal aorta. No aneurysm or dissection. No lymphadenopathy identified.   Reproductive: Status post hysterectomy. No adnexal masses.   Other: Stable morphology of generalized laxity of the midline ventral abdominal wall roughly centered at the level of the umbilicus. There also is stable morphology of a right-sided disruption/gap in the transversalis fascia measuring approximately 3 cm in diameter on axial images and just over 3 cm in height on coronal images with interposition of a C-shaped loop of small bowel through the defect into the right rectus sheath. The inferior aspect of this loop of small bowel appears subjectively thickened compared to the prior CT but without otherwise surrounding inflammation or fluid.   Musculoskeletal: Stable appearance of extensive prior posterior lumbar fusion. No interval lumbar fracture or subluxation.   IMPRESSION: 1. Stable morphology of generalized laxity of the midline ventral abdominal wall roughly centered at the level of the umbilicus. There also is stable morphology of a right-sided disruption/gap in the transversalis fascia measuring approximately 3 cm in diameter on axial images and just over 3 cm in height on coronal images  with interposition of a C-shaped loop of small bowel through the defect into the right rectus sheath. The inferior aspect of this loop of small bowel appears subjectively thickened compared to the prior CT but without otherwise surrounding inflammation or fluid. No evidence to suggest associated small-bowel incarceration or obstruction. 2. Stable mild atherosclerosis of the distal abdominal aorta. 3. Status post cholecystectomy and hysterectomy. 4. Stable appearance of extensive prior posterior lumbar fusion.     Electronically Signed   By: Erica Hau M.D.   On: 01/11/2024 12:01  Assessment and Plan:  NIKO AUMENT is a 73 y.o. female with recurrent hernia on the right side of her abdomen.  This is where her prior recurrence was too and I performed debridement and had to place another mesh. This was before we have access to the robot. Discussed with patient and family option of robotic assisted ventral hernia repair with mesh versus open pending my ability to access her abdomen laparoscopically. Discussed risk of bleeding, infection, injury to bowel, recurrence, and fact that she has recurred in the past. She wants to proceed. Plan to stay overnight.   All questions were answered to the satisfaction of the patient and family.    Awilda Bogus 02/20/2024, 1:56 PM

## 2024-02-21 NOTE — Telephone Encounter (Signed)
 PAP: Application for Novolog  Mix 70/30 and Rybelsus has been submitted to [employer], via fax

## 2024-02-25 NOTE — Telephone Encounter (Signed)
 PAP: Patient assistance application for Novolog  and Rybelsus has been approved by PAP Companies: [employer] from 02/24/2024 to 10/14/2024. Medication should be delivered to PAP Delivery: Provider's office. For further shipping updates, please contact [employer] at [phone]. Patient ID is: [id number]

## 2024-02-25 NOTE — Progress Notes (Signed)
 Pharmacy Medication Assistance Program Note    02/25/2024  Patient ID: [name], female   DOB: 05-11-1951, 73 y.o.   MRN: [id number]     02/21/2024  [employer]  Manufacturer Medication One [employer]  [employer] [facility] Novolog  70/30  Dose of Rybelsus 14mg   Type of Radiographer, therapeutic Assistance  Name of Prescriber Wilburn Handler  Date Application Received From Patient 02/21/2024  Date Application Received From Provider 02/21/2024  Date Application Submitted to Manufacturer 02/21/2024  Method Application Sent to Manufacturer Fax  Patient Assistance Determination Approved  Approval Start Date 02/25/2024  Approval End Date 10/14/2024  Patient Notification Method MyChart     Signature

## 2024-02-26 NOTE — Progress Notes (Signed)
   02/26/2024  Patient ID: [name], female   DOB: 1951/01/03, 74 y.o.   MRN: [id number]  Called patient to inform of PAP approval, she will get insulin  and Rybelsus free for the remainder of the year. Haskell Linker is following up with [employer] for the requested Novolin 70/30 and not Novolog  70/30. The patient has a vial at home from [facility] that she is using currently.   I will follow up to ensure patient has received medications.   Livia Riffle, PharmD Clinical Pharmacist  [phone]

## 2024-02-27 NOTE — Patient Instructions (Signed)
 [name]  02/27/2024     @PREFPERIOPPHARMACY @   Your procedure is scheduled on  03/03/2024.   Report to [facility] at  [phone]  A.M.   Call this number if you have problems the morning of surgery:  [phone]  If you experience any cold or flu symptoms such as cough, fever, chills, shortness of breath, etc. between now and your scheduled surgery, please notify us  at the above number.   Remember:         Your last dose of farxiga should be on 02/28/2024.        Take 1/2 of your usual night time insulin  the night before your procedure.   Do not eat after midnight.   You may drink clear liquids until 0435 am on 03/03/2024.    Clear liquids allowed are:                    Water, Juice (No red color; non-citric and without pulp; diabetics please choose diet or no sugar options), Carbonated beverages (diabetics please choose diet or no sugar options), Clear Tea (No creamer, milk, or cream, including half & half and powdered creamer), Black Coffee Only (No creamer, milk or cream, including half & half and powdered creamer), and Clear Sports drink (No red color; diabetics please choose diet or no sugar options)    Take these medicines the morning of surgery with A SIP OF WATER         amlodipine , gabapentin , meloxicam  (if needed), methocarbamol , metoprolol , pantoprazole ,zofran  (if needed).    Do not wear jewelry, make-up or nail polish, including gel polish,  artificial nails, or any other type of covering on natural nails (fingers and  toes).  Do not wear lotions, powders, or perfumes, or deodorant.  Do not shave 48 hours prior to surgery.  Men may shave face and neck.  Do not bring valuables to the hospital.  [facility] is not responsible for any belongings or valuables.  Contacts, dentures or bridgework may not be worn into surgery.  Leave your suitcase in the car.  After surgery it may be brought to your room.  For patients admitted to the hospital, discharge  time will be determined by your treatment team.  Patients discharged the day of surgery will not be allowed to drive home and must have someone with them for 24 hours.    Special instructions:   DO NOT smoke tobacco or vape for 24 hours before your procedure.  Please read over the following fact sheets that you were given. Coughing and Deep Breathing, Surgical Site Infection Prevention, Anesthesia Post-op Instructions, and Care and Recovery After Surgery         Laparoscopic Surgery for Belly Hernias: What to Know After After the procedure, it's common to have pain, discomfort, or soreness. Follow these instructions at home: Medicines Take your medicines only as told. You may need to take steps to help treat or prevent trouble pooping (constipation), such as: Taking medicines to help you poop. Eating foods high in fiber, like beans, whole grains, and fresh fruits and vegetables. Drinking more fluids as told. Ask your health care provider if it's safe to drive or use machines while taking your medicine. Incision care  Take care of the cuts in your belly as told. Make sure you: Wash your hands with soap and water for at least 20 seconds before and after you change your bandage. If you can't use soap and  water, use hand sanitizer. Change your bandage. Leave stitches or skin glue alone. Leave tape strips alone unless you're told to take them off. You may trim the edges of the tape strips if they curl up. Check the cuts on your belly every day for signs of infection. Check for: More redness, swelling, or pain. More fluid or blood. Warmth. Pus or a bad smell. Activity Rest as told. Get up to take short walks at least every 2 hours during the day. This helps you breathe better and keeps your blood flowing. Ask for help if you feel weak or unsteady. Do not take baths, swim, or use a hot tub until you're told it's OK. Ask if you can shower. Ask if it's OK for you to lift. If you were  given a sedative, do not drive or use machines until you're told it's safe. A sedative can make you sleepy. Ask what things are safe for you to do at home. Ask when you can go back to work or school. General instructions Hold a pillow over your belly when you cough or sneeze. This helps with pain. Wear a binder around your belly as told by your provider. Do not smoke, vape, or use nicotine or tobacco. Wear compression stockings to reduce swelling and help prevent blood clots in your legs. You may be asked to continue to do deep breathing exercises at home. This will help to prevent a lung infection. Contact a health care provider if: You have any signs of infection. You have pain that gets worse or does not get better with medicine. You throw up or you feel like throwing up. You have a cough. You have not pooped in 3 days. You are not able to pee. You have a fever. Get help right away if: You have very bad pain in your belly. You throw up every time you eat or drink. You have redness, warmth, or pain in your leg. You have chest pain. You have trouble breathing. These symptoms may be an emergency. Call 911 right away. Do not wait to see if the symptoms will go away. Do not drive yourself to the hospital. This information is not intended to replace advice given to you by your health care provider. Make sure you discuss any questions you have with your health care provider. Document Revised: 04/09/2023 Document Reviewed: 04/09/2023 Elsevier Patient Education  2024 Else[employer] Inc.General Anesthesia, Adult, Care After The following information offers guidance on how to care for yourself after your procedure. Your health care provider may also give you more specific instructions. If you have problems or questions, contact your health care provider. What can I expect after the procedure? After the procedure, it is common for people to: Have pain or discomfort at the IV site. Have nausea or  vomiting. Have a sore throat or hoarseness. Have trouble concentrating. Feel cold or chills. Feel weak, sleepy, or tired (fatigue). Have soreness and body aches. These can affect parts of the body that were not involved in surgery. Follow these instructions at home: For the time period you were told by your health care provider:  Rest. Do not participate in activities where you could fall or become injured. Do not drive or use machinery. Do not drink alcohol. Do not take sleeping pills or medicines that cause drowsiness. Do not make important decisions or sign legal documents. Do not take care of children on your own. General instructions Drink enough fluid to keep your urine pale yellow. If you  have sleep apnea, surgery and certain medicines can increase your risk for breathing problems. Follow instructions from your health care provider about wearing your sleep device: Anytime you are sleeping, including during daytime naps. While taking prescription pain medicines, sleeping medicines, or medicines that make you drowsy. Return to your normal activities as told by your health care provider. Ask your health care provider what activities are safe for you. Take over-the-counter and prescription medicines only as told by your health care provider. Do not use any products that contain nicotine or tobacco. These products include cigarettes, chewing tobacco, and vaping devices, such as e-cigarettes. These can delay incision healing after surgery. If you need help quitting, ask your health care provider. Contact a health care provider if: You have nausea or vomiting that does not get better with medicine. You vomit every time you eat or drink. You have pain that does not get better with medicine. You cannot urinate or have bloody urine. You develop a skin rash. You have a fever. Get help right away if: You have trouble breathing. You have chest pain. You vomit blood. These symptoms may be  an emergency. Get help right away. Call 911. Do not wait to see if the symptoms will go away. Do not drive yourself to the hospital. Summary After the procedure, it is common to have a sore throat, hoarseness, nausea, vomiting, or to feel weak, sleepy, or fatigue. For the time period you were told by your health care provider, do not drive or use machinery. Get help right away if you have difficulty breathing, have chest pain, or vomit blood. These symptoms may be an emergency. This information is not intended to replace advice given to you by your health care provider. Make sure you discuss any questions you have with your health care provider. Document Revised: 12/29/2021 Document Reviewed: 12/29/2021 Elsevier Patient Education  2024 Elsevier Inc.How to Use Chlorhexidine  at Home in the Shower Chlorhexidine  gluconate (CHG) is a germ-killing (antiseptic) wash that's used to clean the skin. It can get rid of the germs that normally live on the skin and can keep them away for about 24 hours. If you're having surgery, you may be told to shower with CHG at home the night before surgery. This can help lower your risk for infection. To use CHG wash in the shower, follow the steps below. Supplies needed: CHG body wash. Clean washcloth. Clean towel. How to use CHG in the shower Follow these steps unless you're told to use CHG in a different way: Start the shower. Use your normal soap and shampoo to wash your face and hair. Turn off the shower or move out of the shower stream. Pour CHG onto a clean washcloth. Do not use any type of brush or rough sponge. Start at your neck, washing your body down to your toes. Make sure you: Wash the part of your body where the surgery will be done for at least 1 minute. Do not scrub. Do not use CHG on your head or face unless your health care provider tells you to. If it gets into your ears or eyes, rinse them well with water. Do not wash your genitals with  CHG. Wash your back and under your arms. Make sure to wash skin folds. Let the CHG sit on your skin for 1-2 minutes or as long as told. Rinse your entire body in the shower, including all body creases and folds. Turn off the shower. Dry off with a clean towel. Do  not put anything on your skin afterward, such as powder, lotion, or perfume. Put on clean clothes or pajamas. If it's the night before surgery, sleep in clean sheets. General tips Use CHG only as told, and follow the instructions on the label. Use the full amount of CHG as told. This is often one bottle. Do not smoke and stay away from flames after using CHG. Your skin may feel sticky after using CHG. This is normal. The sticky feeling will go away as the CHG dries. Do not use CHG: If you have a chlorhexidine  allergy or have reacted to chlorhexidine  in the past. On open wounds or areas of skin that have broken skin, cuts, or scrapes. On babies younger than 34 months of age. Contact a health care provider if: You have questions about using CHG. Your skin gets irritated or itchy. You have a rash after using CHG. You swallow any CHG. Call your local poison control center [phone] in the [place] Your eyes itch badly, or they become very red or swollen. Your hearing changes. You have trouble seeing. If you can't reach your provider, go to an urgent care or emergency room. Do not drive yourself. Get help right away if: You have swelling or tingling in your mouth or throat. You make high-pitched whistling sounds when you breathe, most often when you breathe out (wheeze). You have trouble breathing. These symptoms may be an emergency. Call 911 right away. Do not wait to see if the symptoms will go away. Do not drive yourself to the hospital. This information is not intended to replace advice given to you by your health care provider. Make sure you discuss any questions you have with your health care provider. Document Revised:  04/16/2023 Document Reviewed: 04/12/2022 Elsevier Patient Education  2024 [employer].

## 2024-02-28 ENCOUNTER — Encounter (HOSPITAL_COMMUNITY)
Admission: RE | Admit: 2024-02-28 | Discharge: 2024-02-28 | Disposition: A | Discharge status: Home / Self Care | Source: Ambulatory Visit | Attending: General Surgery | Admitting: General Surgery

## 2024-02-28 ENCOUNTER — Encounter (HOSPITAL_COMMUNITY): Payer: Self-pay

## 2024-02-28 ENCOUNTER — Other Ambulatory Visit: Payer: Self-pay

## 2024-02-28 VITALS — BP 135/75 | HR 65 | Temp 98.0°F | Resp 18 | Ht 65.0 in | Wt 175.0 lb

## 2024-02-28 DIAGNOSIS — E1169 Type 2 diabetes mellitus with other specified complication: Secondary | ICD-10-CM | POA: Diagnosis not present

## 2024-02-28 DIAGNOSIS — Z01812 Encounter for preprocedural laboratory examination: Secondary | ICD-10-CM | POA: Insufficient documentation

## 2024-02-28 DIAGNOSIS — Z01818 Encounter for other preprocedural examination: Secondary | ICD-10-CM | POA: Diagnosis present

## 2024-02-28 DIAGNOSIS — Z794 Long term (current) use of insulin: Secondary | ICD-10-CM | POA: Insufficient documentation

## 2024-02-28 LAB — HEMOGLOBIN A1C
Hgb A1c MFr Bld: 12.3 % — ABNORMAL HIGH (ref 4.8–5.6)
Mean Plasma Glucose: 306.31 mg/dL

## 2024-02-28 LAB — BASIC METABOLIC PANEL WITH GFR
Anion gap: 13 (ref 5–15)
BUN: 18 mg/dL (ref 8–23)
CO2: 24 mmol/L (ref 22–32)
Calcium: 9.4 mg/dL (ref 8.9–10.3)
Chloride: 95 mmol/L — ABNORMAL LOW (ref 98–111)
Creatinine, Ser: 1.35 mg/dL — ABNORMAL HIGH (ref 0.44–1.00)
GFR, Estimated: 41 mL/min — ABNORMAL LOW (ref >60)
Glucose, Bld: 239 mg/dL — ABNORMAL HIGH (ref 70–99)
Potassium: 3.7 mmol/L (ref 3.5–5.1)
Sodium: 132 mmol/L — ABNORMAL LOW (ref 135–145)

## 2024-02-28 LAB — NO BLOOD PRODUCTS

## 2024-03-03 ENCOUNTER — Observation Stay (HOSPITAL_COMMUNITY)

## 2024-03-03 ENCOUNTER — Other Ambulatory Visit: Payer: Self-pay

## 2024-03-03 ENCOUNTER — Encounter (HOSPITAL_COMMUNITY): Payer: Self-pay | Admitting: General Surgery

## 2024-03-03 ENCOUNTER — Ambulatory Visit (HOSPITAL_COMMUNITY): Payer: Self-pay | Admitting: Anesthesiology

## 2024-03-03 ENCOUNTER — Observation Stay (HOSPITAL_COMMUNITY)
Admission: RE | Admit: 2024-03-03 | Discharge: 2024-03-06 | Disposition: A | Discharge status: Home / Self Care | Source: Ambulatory Visit | Attending: General Surgery | Admitting: General Surgery

## 2024-03-03 ENCOUNTER — Ambulatory Visit (HOSPITAL_BASED_OUTPATIENT_CLINIC_OR_DEPARTMENT_OTHER): Payer: Self-pay | Admitting: Anesthesiology

## 2024-03-03 ENCOUNTER — Encounter (HOSPITAL_COMMUNITY)
Admission: RE | Disposition: A | Discharge status: Home / Self Care | Payer: Self-pay | Source: Ambulatory Visit | Attending: General Surgery

## 2024-03-03 DIAGNOSIS — J45909 Unspecified asthma, uncomplicated: Secondary | ICD-10-CM | POA: Diagnosis present

## 2024-03-03 DIAGNOSIS — K430 Incisional hernia with obstruction, without gangrene: Secondary | ICD-10-CM

## 2024-03-03 DIAGNOSIS — Z794 Long term (current) use of insulin: Secondary | ICD-10-CM | POA: Diagnosis not present

## 2024-03-03 DIAGNOSIS — K436 Other and unspecified ventral hernia with obstruction, without gangrene: Secondary | ICD-10-CM | POA: Diagnosis not present

## 2024-03-03 DIAGNOSIS — R9389 Abnormal findings on diagnostic imaging of other specified body structures: Secondary | ICD-10-CM | POA: Diagnosis not present

## 2024-03-03 DIAGNOSIS — K219 Gastro-esophageal reflux disease without esophagitis: Secondary | ICD-10-CM | POA: Diagnosis present

## 2024-03-03 DIAGNOSIS — I10 Essential (primary) hypertension: Secondary | ICD-10-CM

## 2024-03-03 DIAGNOSIS — Z7984 Long term (current) use of oral hypoglycemic drugs: Secondary | ICD-10-CM | POA: Insufficient documentation

## 2024-03-03 DIAGNOSIS — J438 Other emphysema: Secondary | ICD-10-CM | POA: Diagnosis not present

## 2024-03-03 DIAGNOSIS — E119 Type 2 diabetes mellitus without complications: Secondary | ICD-10-CM | POA: Diagnosis not present

## 2024-03-03 DIAGNOSIS — Z79899 Other long term (current) drug therapy: Secondary | ICD-10-CM | POA: Diagnosis not present

## 2024-03-03 DIAGNOSIS — R079 Chest pain, unspecified: Secondary | ICD-10-CM | POA: Diagnosis not present

## 2024-03-03 DIAGNOSIS — J449 Chronic obstructive pulmonary disease, unspecified: Secondary | ICD-10-CM

## 2024-03-03 DIAGNOSIS — K432 Incisional hernia without obstruction or gangrene: Principal | ICD-10-CM | POA: Diagnosis present

## 2024-03-03 DIAGNOSIS — R0989 Other specified symptoms and signs involving the circulatory and respiratory systems: Secondary | ICD-10-CM | POA: Diagnosis not present

## 2024-03-03 DIAGNOSIS — E785 Hyperlipidemia, unspecified: Secondary | ICD-10-CM

## 2024-03-03 DIAGNOSIS — R0789 Other chest pain: Secondary | ICD-10-CM | POA: Diagnosis present

## 2024-03-03 DIAGNOSIS — Z7982 Long term (current) use of aspirin: Secondary | ICD-10-CM | POA: Insufficient documentation

## 2024-03-03 DIAGNOSIS — E871 Hypo-osmolality and hyponatremia: Secondary | ICD-10-CM | POA: Insufficient documentation

## 2024-03-03 DIAGNOSIS — K439 Ventral hernia without obstruction or gangrene: Secondary | ICD-10-CM | POA: Diagnosis present

## 2024-03-03 HISTORY — PX: XI ROBOTIC ASSISTED VENTRAL HERNIA: SHX6789

## 2024-03-03 HISTORY — PX: LAPAROSCOPIC LYSIS OF ADHESIONS: SHX5905

## 2024-03-03 LAB — CBC WITH DIFFERENTIAL/PLATELET
Abs Immature Granulocytes: 0 10*3/uL (ref 0.00–0.07)
Basophils Absolute: 0 10*3/uL (ref 0.0–0.1)
Basophils Relative: 0 %
Eosinophils Absolute: 0 10*3/uL (ref 0.0–0.5)
Eosinophils Relative: 0 %
HCT: 45.6 % (ref 36.0–46.0)
Hemoglobin: 14.6 g/dL (ref 12.0–15.0)
Lymphocytes Relative: 10 %
Lymphs Abs: 1.2 10*3/uL (ref 0.7–4.0)
MCH: 29.8 pg (ref 26.0–34.0)
MCHC: 32 g/dL (ref 30.0–36.0)
MCV: 93.1 fL (ref 80.0–100.0)
Monocytes Absolute: 0.4 10*3/uL (ref 0.1–1.0)
Monocytes Relative: 3 %
Neutro Abs: 10.7 10*3/uL — ABNORMAL HIGH (ref 1.7–7.7)
Neutrophils Relative %: 87 %
Platelets: 232 10*3/uL (ref 150–400)
RBC: 4.9 MIL/uL (ref 3.87–5.11)
RDW: 13.2 % (ref 11.5–15.5)
WBC: 12.3 10*3/uL — ABNORMAL HIGH (ref 4.0–10.5)
nRBC: 0 % (ref 0.0–0.2)

## 2024-03-03 LAB — BASIC METABOLIC PANEL WITH GFR
Anion gap: 10 (ref 5–15)
BUN: 17 mg/dL (ref 8–23)
CO2: 27 mmol/L (ref 22–32)
Calcium: 9.2 mg/dL (ref 8.9–10.3)
Chloride: 97 mmol/L — ABNORMAL LOW (ref 98–111)
Creatinine, Ser: 0.9 mg/dL (ref 0.44–1.00)
GFR, Estimated: >60 mL/min (ref >60)
Glucose, Bld: 206 mg/dL — ABNORMAL HIGH (ref 70–99)
Potassium: 3.7 mmol/L (ref 3.5–5.1)
Sodium: 134 mmol/L — ABNORMAL LOW (ref 135–145)

## 2024-03-03 LAB — TROPONIN I (HIGH SENSITIVITY)
Troponin I (High Sensitivity): 7 ng/L (ref <18)
Troponin I (High Sensitivity): 6 ng/L (ref <18)

## 2024-03-03 LAB — GLUCOSE, CAPILLARY
Glucose-Capillary: 166 mg/dL — ABNORMAL HIGH (ref 70–99)
Glucose-Capillary: 339 mg/dL — ABNORMAL HIGH (ref 70–99)
Glucose-Capillary: 220 mg/dL — ABNORMAL HIGH (ref 70–99)
Glucose-Capillary: 256 mg/dL — ABNORMAL HIGH (ref 70–99)
Glucose-Capillary: 248 mg/dL — ABNORMAL HIGH (ref 70–99)

## 2024-03-03 SURGERY — REPAIR, HERNIA, VENTRAL, ROBOT-ASSISTED
Anesthesia: General | Site: Abdomen

## 2024-03-03 MED ORDER — ONDANSETRON 4 MG PO TBDP: 4.0000 mg | ORAL_TABLET | Freq: Four times a day (QID) | ORAL | Status: DC | PRN

## 2024-03-03 MED ORDER — PHENYLEPHRINE HCL-NACL 20-0.9 MG/250ML-% IV SOLN
INTRAVENOUS | Status: AC
  Filled 2024-03-03: qty 250

## 2024-03-03 MED ORDER — PHENYLEPHRINE 80 MCG/ML (10ML) SYRINGE FOR IV PUSH (FOR BLOOD PRESSURE SUPPORT)
PREFILLED_SYRINGE | INTRAVENOUS | Status: DC | PRN
  Administered 2024-03-03: 40 ug via INTRAVENOUS
  Administered 2024-03-03 (×3): 120 ug via INTRAVENOUS
  Administered 2024-03-03: 80 ug via INTRAVENOUS

## 2024-03-03 MED ORDER — HYDROCHLOROTHIAZIDE 25 MG PO TABS
25.0000 mg | ORAL_TABLET | Freq: Every day | ORAL | Status: DC
  Administered 2024-03-04 – 2024-03-06 (×3): 25 mg via ORAL
  Filled 2024-03-03 (×3): qty 1

## 2024-03-03 MED ORDER — ROCURONIUM BROMIDE 10 MG/ML (PF) SYRINGE
PREFILLED_SYRINGE | INTRAVENOUS | Status: AC
  Filled 2024-03-03: qty 10

## 2024-03-03 MED ORDER — ONDANSETRON HCL 4 MG/2ML IJ SOLN
INTRAMUSCULAR | Status: AC
  Filled 2024-03-03: qty 2

## 2024-03-03 MED ORDER — INSULIN ASPART PROT & ASPART (70-30 MIX) 100 UNIT/ML ~~LOC~~ SUSP
30.0000 [IU] | Freq: Two times a day (BID) | SUBCUTANEOUS | Status: DC
  Administered 2024-03-03 – 2024-03-06 (×6): 30 [IU] via SUBCUTANEOUS
  Filled 2024-03-03: qty 10

## 2024-03-03 MED ORDER — DICYCLOMINE HCL 10 MG PO CAPS
10.0000 mg | ORAL_CAPSULE | Freq: Once | ORAL | Status: AC
  Administered 2024-03-03: 10 mg via ORAL
  Filled 2024-03-03: qty 1

## 2024-03-03 MED ORDER — CEFAZOLIN SODIUM-DEXTROSE 2-4 GM/100ML-% IV SOLN
2.0000 g | INTRAVENOUS | Status: AC
  Administered 2024-03-03: 2 g via INTRAVENOUS
  Filled 2024-03-03: qty 100

## 2024-03-03 MED ORDER — SIMETHICONE 80 MG PO CHEW: 40.0000 mg | CHEWABLE_TABLET | Freq: Four times a day (QID) | ORAL | Status: DC | PRN

## 2024-03-03 MED ORDER — CHLORHEXIDINE GLUCONATE CLOTH 2 % EX PADS
6.0000 | MEDICATED_PAD | Freq: Once | CUTANEOUS | Status: DC
Start: 2024-03-03 — End: 2024-03-03

## 2024-03-03 MED ORDER — ONDANSETRON HCL 4 MG/2ML IJ SOLN: 4.0000 mg | Freq: Four times a day (QID) | INTRAMUSCULAR | Status: DC | PRN

## 2024-03-03 MED ORDER — EZETIMIBE 10 MG PO TABS
10.0000 mg | ORAL_TABLET | Freq: Every day | ORAL | Status: DC
  Administered 2024-03-03 – 2024-03-06 (×4): 10 mg via ORAL
  Filled 2024-03-03 (×4): qty 1

## 2024-03-03 MED ORDER — ONDANSETRON HCL 4 MG/2ML IJ SOLN: 4.0000 mg | Freq: Once | INTRAMUSCULAR | Status: DC | PRN

## 2024-03-03 MED ORDER — HYDROCODONE-ACETAMINOPHEN 7.5-325 MG PO TABS: 1.0000 | ORAL_TABLET | Freq: Once | ORAL | Status: DC | PRN

## 2024-03-03 MED ORDER — BUPIVACAINE HCL (PF) 0.5 % IJ SOLN
INTRAMUSCULAR | Status: DC | PRN
  Administered 2024-03-03: 30 mL

## 2024-03-03 MED ORDER — DEXAMETHASONE SODIUM PHOSPHATE 10 MG/ML IJ SOLN
INTRAMUSCULAR | Status: DC | PRN
  Administered 2024-03-03: 8 mg via INTRAVENOUS

## 2024-03-03 MED ORDER — METFORMIN HCL 500 MG PO TABS: 1000.0000 mg | ORAL_TABLET | Freq: Two times a day (BID) | ORAL | Status: DC

## 2024-03-03 MED ORDER — ATORVASTATIN CALCIUM 40 MG PO TABS
40.0000 mg | ORAL_TABLET | Freq: Every day | ORAL | Status: DC
  Administered 2024-03-03 – 2024-03-06 (×4): 40 mg via ORAL
  Filled 2024-03-03 (×4): qty 1

## 2024-03-03 MED ORDER — FENTANYL CITRATE (PF) 100 MCG/2ML IJ SOLN
INTRAMUSCULAR | Status: DC | PRN
  Administered 2024-03-03: 25 ug via INTRAVENOUS
  Administered 2024-03-03: 50 ug via INTRAVENOUS

## 2024-03-03 MED ORDER — STERILE WATER FOR IRRIGATION IR SOLN
Status: DC | PRN
  Administered 2024-03-03: 500 mL

## 2024-03-03 MED ORDER — ONDANSETRON HCL 4 MG/2ML IJ SOLN
INTRAMUSCULAR | Status: DC | PRN
  Administered 2024-03-03: 4 mg via INTRAVENOUS

## 2024-03-03 MED ORDER — PANTOPRAZOLE SODIUM 40 MG PO TBEC
40.0000 mg | DELAYED_RELEASE_TABLET | Freq: Two times a day (BID) | ORAL | Status: DC
  Administered 2024-03-03 – 2024-03-06 (×6): 40 mg via ORAL
  Filled 2024-03-03 (×6): qty 1

## 2024-03-03 MED ORDER — ORAL CARE MOUTH RINSE: 15.0000 mL | Freq: Once | OROMUCOSAL | Status: AC

## 2024-03-03 MED ORDER — MORPHINE SULFATE (PF) 2 MG/ML IV SOLN
2.0000 mg | INTRAVENOUS | Status: DC | PRN
  Administered 2024-03-03: 2 mg via INTRAVENOUS
  Filled 2024-03-03: qty 1

## 2024-03-03 MED ORDER — CHLORHEXIDINE GLUCONATE 0.12 % MT SOLN
15.0000 mL | Freq: Once | OROMUCOSAL | Status: AC
  Administered 2024-03-03: 15 mL via OROMUCOSAL

## 2024-03-03 MED ORDER — HYDROMORPHONE HCL 1 MG/ML IJ SOLN
1.0000 mg | INTRAMUSCULAR | Status: DC | PRN
  Administered 2024-03-03 – 2024-03-05 (×3): 1 mg via INTRAVENOUS
  Filled 2024-03-03 (×3): qty 1

## 2024-03-03 MED ORDER — ROCURONIUM BROMIDE 100 MG/10ML IV SOLN
INTRAVENOUS | Status: DC | PRN
  Administered 2024-03-03 (×2): 10 mg via INTRAVENOUS
  Administered 2024-03-03: 60 mg via INTRAVENOUS
  Administered 2024-03-03: 10 mg via INTRAVENOUS

## 2024-03-03 MED ORDER — DIPHENHYDRAMINE HCL 50 MG/ML IJ SOLN
12.5000 mg | Freq: Four times a day (QID) | INTRAMUSCULAR | Status: DC | PRN
  Administered 2024-03-05: 12.5 mg via INTRAVENOUS
  Filled 2024-03-03: qty 1

## 2024-03-03 MED ORDER — NITROGLYCERIN 0.4 MG SL SUBL: 0.4000 mg | SUBLINGUAL_TABLET | SUBLINGUAL | Status: DC | PRN

## 2024-03-03 MED ORDER — NITROGLYCERIN 0.4 MG SL SUBL
SUBLINGUAL_TABLET | SUBLINGUAL | Status: AC
  Administered 2024-03-03: 0.4 mg via SUBLINGUAL
  Filled 2024-03-03: qty 1

## 2024-03-03 MED ORDER — DIPHENHYDRAMINE HCL 12.5 MG/5ML PO ELIX
12.5000 mg | ORAL_SOLUTION | Freq: Four times a day (QID) | ORAL | Status: DC | PRN
  Administered 2024-03-05: 12.5 mg via ORAL
  Filled 2024-03-03: qty 5

## 2024-03-03 MED ORDER — INSULIN ASPART 100 UNIT/ML IJ SOLN
0.0000 [IU] | INTRAMUSCULAR | Status: DC
  Administered 2024-03-03: 8 [IU] via SUBCUTANEOUS
  Administered 2024-03-03: 5 [IU] via SUBCUTANEOUS
  Administered 2024-03-03: 11 [IU] via SUBCUTANEOUS
  Administered 2024-03-04: 2 [IU] via SUBCUTANEOUS
  Administered 2024-03-04: 3 [IU] via SUBCUTANEOUS
  Administered 2024-03-04: 2 [IU] via SUBCUTANEOUS
  Administered 2024-03-04: 3 [IU] via SUBCUTANEOUS
  Administered 2024-03-04 – 2024-03-05 (×2): 2 [IU] via SUBCUTANEOUS
  Administered 2024-03-05: 3 [IU] via SUBCUTANEOUS
  Administered 2024-03-05 (×2): 2 [IU] via SUBCUTANEOUS
  Administered 2024-03-05: 3 [IU] via SUBCUTANEOUS
  Administered 2024-03-06: 2 [IU] via SUBCUTANEOUS
  Filled 2024-03-03: qty 0.15

## 2024-03-03 MED ORDER — SUGAMMADEX SODIUM 200 MG/2ML IV SOLN
INTRAVENOUS | Status: DC | PRN
  Administered 2024-03-03: 100 mg via INTRAVENOUS

## 2024-03-03 MED ORDER — PHENYLEPHRINE HCL-NACL 20-0.9 MG/250ML-% IV SOLN
INTRAVENOUS | Status: DC | PRN
  Administered 2024-03-03: 30 ug/min via INTRAVENOUS

## 2024-03-03 MED ORDER — LACTATED RINGERS IV SOLN: INTRAVENOUS | Status: DC

## 2024-03-03 MED ORDER — MAGNESIUM HYDROXIDE 400 MG/5ML PO SUSP: 30.0000 mL | Freq: Every day | ORAL | Status: DC | PRN

## 2024-03-03 MED ORDER — AMLODIPINE BESYLATE 5 MG PO TABS
10.0000 mg | ORAL_TABLET | Freq: Every day | ORAL | Status: DC
  Administered 2024-03-04 – 2024-03-06 (×3): 10 mg via ORAL
  Filled 2024-03-03 (×2): qty 2
  Filled 2024-03-03: qty 1
  Filled 2024-03-03: qty 2

## 2024-03-03 MED ORDER — BUPIVACAINE HCL (PF) 0.5 % IJ SOLN
INTRAMUSCULAR | Status: AC
  Filled 2024-03-03: qty 30

## 2024-03-03 MED ORDER — FLUOROMETHOLONE 0.1 % OP SUSP: 1.0000 [drp] | Freq: Every day | OPHTHALMIC | Status: DC | PRN

## 2024-03-03 MED ORDER — METHOCARBAMOL 500 MG PO TABS
500.0000 mg | ORAL_TABLET | Freq: Four times a day (QID) | ORAL | Status: DC
  Administered 2024-03-03 – 2024-03-06 (×11): 500 mg via ORAL
  Filled 2024-03-03 (×12): qty 1

## 2024-03-03 MED ORDER — DICYCLOMINE HCL 10 MG PO CAPS: 10.0000 mg | ORAL_CAPSULE | Freq: Three times a day (TID) | ORAL | Status: DC | PRN

## 2024-03-03 MED ORDER — METOPROLOL TARTRATE 5 MG/5ML IV SOLN: 5.0000 mg | Freq: Four times a day (QID) | INTRAVENOUS | Status: DC | PRN

## 2024-03-03 MED ORDER — DEXAMETHASONE SODIUM PHOSPHATE 10 MG/ML IJ SOLN
INTRAMUSCULAR | Status: AC
  Filled 2024-03-03: qty 1

## 2024-03-03 MED ORDER — DOCUSATE SODIUM 100 MG PO CAPS
100.0000 mg | ORAL_CAPSULE | Freq: Two times a day (BID) | ORAL | Status: DC
  Administered 2024-03-03 – 2024-03-06 (×6): 100 mg via ORAL
  Filled 2024-03-03 (×6): qty 1

## 2024-03-03 MED ORDER — METOPROLOL TARTRATE 25 MG PO TABS
25.0000 mg | ORAL_TABLET | Freq: Two times a day (BID) | ORAL | Status: DC
  Administered 2024-03-03 – 2024-03-05 (×3): 25 mg via ORAL
  Filled 2024-03-03 (×6): qty 1

## 2024-03-03 MED ORDER — PROPOFOL 10 MG/ML IV BOLUS
INTRAVENOUS | Status: AC
  Filled 2024-03-03: qty 20

## 2024-03-03 MED ORDER — LIDOCAINE 2% (20 MG/ML) 5 ML SYRINGE
INTRAMUSCULAR | Status: AC
  Filled 2024-03-03: qty 5

## 2024-03-03 MED ORDER — CEFAZOLIN SODIUM-DEXTROSE 2-4 GM/100ML-% IV SOLN
2.0000 g | Freq: Three times a day (TID) | INTRAVENOUS | Status: AC
  Administered 2024-03-03: 2 g via INTRAVENOUS
  Filled 2024-03-03: qty 100

## 2024-03-03 MED ORDER — LIDOCAINE 2% (20 MG/ML) 5 ML SYRINGE
INTRAMUSCULAR | Status: DC | PRN
  Administered 2024-03-03: 80 mg via INTRAVENOUS

## 2024-03-03 MED ORDER — FENTANYL CITRATE PF 50 MCG/ML IJ SOSY: 25.0000 ug | PREFILLED_SYRINGE | INTRAMUSCULAR | Status: DC | PRN

## 2024-03-03 MED ORDER — FENTANYL CITRATE (PF) 100 MCG/2ML IJ SOLN
INTRAMUSCULAR | Status: AC
  Filled 2024-03-03: qty 2

## 2024-03-03 MED ORDER — PHENYLEPHRINE 80 MCG/ML (10ML) SYRINGE FOR IV PUSH (FOR BLOOD PRESSURE SUPPORT)
PREFILLED_SYRINGE | INTRAVENOUS | Status: AC
  Filled 2024-03-03: qty 20

## 2024-03-03 MED ORDER — OXYCODONE HCL 5 MG PO TABS: 5.0000 mg | ORAL_TABLET | ORAL | Status: DC | PRN

## 2024-03-03 MED ORDER — GABAPENTIN 300 MG PO CAPS
300.0000 mg | ORAL_CAPSULE | Freq: Three times a day (TID) | ORAL | Status: DC
  Administered 2024-03-03 – 2024-03-06 (×9): 300 mg via ORAL
  Filled 2024-03-03 (×9): qty 1

## 2024-03-03 SURGICAL SUPPLY — 42 items
BLADE SURG 15 STRL LF DISP TIS (BLADE) ×1 IMPLANT
CHLORAPREP W/TINT 26 (MISCELLANEOUS) ×1 IMPLANT
COVER LIGHT HANDLE STERIS (MISCELLANEOUS) ×2 IMPLANT
COVER MAYO STAND XLG (MISCELLANEOUS) ×2 IMPLANT
COVER TIP SHEARS 8 DVNC (MISCELLANEOUS) ×1 IMPLANT
DERMABOND ADVANCED .7 DNX12 (GAUZE/BANDAGES/DRESSINGS) ×1 IMPLANT
DRAPE ARM DVNC X/XI (DISPOSABLE) ×3 IMPLANT
DRAPE COLUMN DVNC XI (DISPOSABLE) ×1 IMPLANT
DRIVER NDL MEGA SUTCUT DVNCXI (INSTRUMENTS) ×1 IMPLANT
DRIVER NDLE MEGA SUTCUT DVNCXI (INSTRUMENTS) ×1 IMPLANT
ELECTRODE REM PT RTRN 9FT ADLT (ELECTROSURGICAL) ×1 IMPLANT
FORCEPS BPLR R/ABLATION 8 DVNC (INSTRUMENTS) ×1 IMPLANT
FORCEPS PROGRASP DVNC XI (FORCEP) IMPLANT
GLOVE BIO SURGEON STRL SZ 6.5 (GLOVE) ×2 IMPLANT
GLOVE BIOGEL PI IND STRL 6.5 (GLOVE) ×2 IMPLANT
GLOVE BIOGEL PI IND STRL 7.0 (GLOVE) ×4 IMPLANT
GOWN STRL REUS W/TWL LRG LVL3 (GOWN DISPOSABLE) ×3 IMPLANT
KIT PINK PAD W/HEAD ARE REST (MISCELLANEOUS) ×1 IMPLANT
KIT PINK PAD W/HEAD ARM REST (MISCELLANEOUS) ×1 IMPLANT
KIT TURNOVER KIT A (KITS) ×1 IMPLANT
LIGASURE LAP MARYLAND 5MM 37CM (ELECTROSURGICAL) IMPLANT
MANIFOLD NEPTUNE II (INSTRUMENTS) ×1 IMPLANT
MESH VENTRALIGHT ST 4.5IN (Mesh General) IMPLANT
NDL HYPO 21X1.5 SAFETY (NEEDLE) ×1 IMPLANT
NDL INSUFFLATION 14GA 120MM (NEEDLE) ×1 IMPLANT
NEEDLE HYPO 21X1.5 SAFETY (NEEDLE) ×1 IMPLANT
NEEDLE INSUFFLATION 14GA 120MM (NEEDLE) ×1 IMPLANT
OBTURATOR OPTICALSTD 8 DVNC (TROCAR) ×1 IMPLANT
PACK LAP CHOLE LZT030E (CUSTOM PROCEDURE TRAY) ×1 IMPLANT
PENCIL HANDSWITCHING (ELECTRODE) ×1 IMPLANT
POSITIONER HEAD 8X9X4 ADT (SOFTGOODS) ×1 IMPLANT
SCISSORS MNPLR CVD DVNC XI (INSTRUMENTS) ×1 IMPLANT
SEAL UNIV 5-12 XI (MISCELLANEOUS) ×3 IMPLANT
SET BASIN LINEN APH (SET/KITS/TRAYS/PACK) ×1 IMPLANT
SET TUBE SMOKE EVAC HIGH FLOW (TUBING) ×1 IMPLANT
SUT MNCRL AB 4-0 PS2 18 (SUTURE) ×1 IMPLANT
SUT STRATA 3-0 SH (SUTURE) ×4 IMPLANT
SUTURE STRATFX 0 PDS+ CT-2 23 (SUTURE) ×1 IMPLANT
SYR 30ML LL (SYRINGE) ×1 IMPLANT
TAPE TRANSPORE STRL 2 31045 (GAUZE/BANDAGES/DRESSINGS) ×2 IMPLANT
TRAY FOLEY W/BAG SLVR 16FR ST (SET/KITS/TRAYS/PACK) IMPLANT
WATER STERILE IRR 500ML POUR (IV SOLUTION) ×1 IMPLANT

## 2024-03-03 NOTE — Brief Op Note (Signed)
 03/03/2024  11:00 AM  PATIENT:  [name]  73 y.o. female  PRE-OPERATIVE DIAGNOSIS:  INCARCERATED VENTRAL HERNIA, RECURRENT 5 CM  POST-OPERATIVE DIAGNOSIS:  INCARCERATED VENTRAL HERNIA, RECURRENT 5 CM  PROCEDURE:  Procedure(s) with comments: REPAIR, HERNIA, VENTRAL, ROBOT-ASSISTED (N/A) - W/ MESH POSSIBLE OPEN, pt knows to arrive at 6:15 EXTENSIVE LYSIS, ADHESIONS, LAPAROSCOPIC (N/A)  SURGEON:  Surgeons and Role:    * Awilda Bogus, MD - Primary  PHYSICIAN ASSISTANT:   ASSISTANTS: none   ANESTHESIA:   general  EBL:  25 mL   BLOOD ADMINISTERED:none  DRAINS: none   LOCAL MEDICATIONS USED:  MARCAINE      SPECIMEN:  No Specimen  DISPOSITION OF SPECIMEN:  N/A  COUNTS:  YES  TOURNIQUET:  * No tourniquets in log *  DICTATION: .Note written in EPIC  PLAN OF CARE: Admit for overnight observation  PATIENT DISPOSITION:  PACU - hemodynamically stable.   Delay start of Pharmacological VTE agent (>24hrs) due to surgical blood loss or risk of bleeding: No applicable.

## 2024-03-03 NOTE — Progress Notes (Signed)
 [facility]  Labwork reassuring.   Deena Farrier, MD

## 2024-03-03 NOTE — Progress Notes (Signed)
 [facility]  Updated family that surgery completed. Observation orders in place. Clear diet due to risk of ileus. Binder. Labs in the AM.   Deena Farrier, MD [facility] [address] [place], [place] [address] [phone] (office)

## 2024-03-03 NOTE — Anesthesia Preprocedure Evaluation (Addendum)
 Anesthesia Evaluation  Patient identified by MRN, date of birth, ID band Patient awake    Reviewed: Allergy & Precautions, H&P , NPO status , Patient's Chart, lab work & pertinent test results  Airway Mallampati: II  TM Distance: >3 FB Neck ROM: Full    Dental  (+) Edentulous Upper, Edentulous Lower   Pulmonary asthma , COPD   Pulmonary exam normal breath sounds clear to auscultation       Cardiovascular hypertension, Normal cardiovascular exam Rhythm:Regular Rate:Normal  Ef 55-60%   Neuro/Psych  Headaches  Neuromuscular disease  negative psych ROS   GI/Hepatic Neg liver ROS,GERD  ,,  Endo/Other  diabetes    Renal/GU negative Renal ROS  negative genitourinary   Musculoskeletal  (+) Arthritis ,    Abdominal   Peds negative pediatric ROS (+)  Hematology  (+) Blood dyscrasia, anemia   Anesthesia Other Findings   Reproductive/Obstetrics negative OB ROS                             Anesthesia Physical Anesthesia Plan  ASA: 3  Anesthesia Plan: General   Post-op Pain Management:    Induction: Intravenous  PONV Risk Score and Plan:   Airway Management Planned: Oral ETT  Additional Equipment:   Intra-op Plan:   Post-operative Plan: Extubation in OR  Informed Consent: I have reviewed the patients History and Physical, chart, labs and discussed the procedure including the risks, benefits and alternatives for the proposed anesthesia with the patient or authorized representative who has indicated his/her understanding and acceptance.     Dental advisory given  Plan Discussed with: CRNA  Anesthesia Plan Comments:        Anesthesia Quick Evaluation

## 2024-03-03 NOTE — OR Nursing (Signed)
 Called to update family and spoke to [place]. Let him know that we are proceeding with the robot portion and had to take down adhesions.

## 2024-03-03 NOTE — Op Note (Signed)
 [facility] Operative Note  03/03/24  Preoperative Diagnosis:  Ventral hernia    Postoperative Diagnosis: Incarcerated ventral hernia with small bowel, extensive adhesions    Procedure(s) Performed: Robotic assisted laparoscopic ventral hernia with mesh 11.4cm, defect size 6cm, lysis of adhesions X 2 hours    Surgeon: Dixon Fredrickson. Collene Dawson, MD   Assistants: No qualified resident was available    Anesthesia: General endotracheal   Anesthesiologist: [name], MD    Specimens: None    Estimated Blood Loss: Minimal   Blood Replacement: None    Complications: None   Wound Class: Clean   Operative Indications: [name] is a very sweet 73 yo who has a recurrent ventral hernia with small bowel and concern that the bowel is sandwiched between mesh/ rectus muscle. We discussed risk of surgery and attempt at robotic assisted surgery to get better closure. Discussed risk of bleeding, infection, recurrence, and this is especially true given her Hgb A1C. She has not been on her diabetes medicines until the last week as she could not get them she reported. Discussed that this increases her risk of issues but the patient reports she is having discomfort in the area, and she is at risk for obstruction given the finding on CT. Discussed that we could proceed but that she has a risk of recurrence and that if she needed further hernia surgeries, she may need a component separation, which she may not qualify for with her diabetes and medical issues.   Findings: 6cm defect right of umbilicus, 1cm defect more superior  Tension free repair achieved with  11.4cm mesh and suture Adequate hemostasis    Well incorporated mesh from prior repairs,  right inferior lateral aspect of mesh pulled away from abdominal wall, defect noted by my fingers below (2X6cm)    After primary closure of the defect and additional mesh placement over the area with overhang.    Procedure: The  patient was brought to the operating room and general anesthesia was induced. A time-out was completed verifying correct patient, procedure, site, positioning, and implant(s) and/or special equipment prior to beginning this procedure. Antibiotics were administered prior to making the incision. SCDs placed. A foley and OG were placed. The anterior abdominal wall was prepped and draped in the standard sterile fashion.   At Palmer's point  was used and a stab incision was made.  A Veress needle placed and the saline drop test was performed. The abdomen was insufflated to 15cm without issues. Lateral ports on the left side were placed using the optiview ports, placing the 8mm camera port first just lateral to the Veress stab incision.  Once I was in, I noted extensive adhesion of small bowel and colon. There was no injury to the bowel under the Veress or port site.  I could get a second trocar in the epigastric region, and placed a 8 mm trocar under direct visualization. Then I used a laparoscopic 5 mm [place]  type ligasure and carefully started to take down adhesions from the anterior abdominal wall moving from the superior epigastric region downward and onto the left side wall. Once I had the side wall cleared, I added a third 8 mm trocar to the mid left lateral side wall. There was adequate space between each trocar. I then docked the Robot and placed the scissors in the epigastric site and the forced bipolar in the left mid lateral trocar site.    I continued to carefully take down adhesions from the anterior abdominal  wall, finding the mesh and working more inferior and outward to prevent any injury to the bowel. The mesh was very well incorporated. In the right lower lateral aspect of the mesh, the mesh had pulled away from the abdominal wall and a loop of small intestine was sandwiched between the mesh and the abdominal wall.  I continued to lysis adhesions as I could, but ultimately I had to scrub back in,  and place a 4th trocar in the left lower abdomen lateral under direct visualization. I then placed the forced bipolar in Arm 1 (Left lower) the camera in Arm 2 (Left mid)  and the scissors in Arm 3 (left upper lateral abdomen) and a Progasper in Arm 4 (epigastric). From here I was able to perform careful dissection of the small bowel and reduced it from the incarcerated hernia sac between the mesh and the abdominal wall.    I continued to clear the remaining abdominal wall more inferiorly until I had exposed her prior mesh. Total time of lysis of adhesions was 2 hours.  (See Photo). There was an area in the right lateral lower aspect of her prior mesh where the mesh pulled away from the wall. I could see the fascia. Her abdominal wall has significant diastasis, and her mesh is essentially an inlay mesh with some overhang given her diastasis and defect.  This defect measured 2X6cm. There was an additional small 1cm defect where the mesh had pulled off the wall superiorly at about the 11 o'clock position.   I felt like the best option was  going to be for me to sew the mesh back down to the fascia and let this incorporate into the tissue like the remaining mesh had done. The mesh was so well incorporated, I could not remove any of it safely.  I also wanted to cover this with an additional mesh, which I felt would incorporate given how well the old mesh had from her prior repairs.  The 11.4cm mesh and 5 sutures (Two 0 Stratafix, Three 3-0 Stratafix) were placed in the abdominal cavity under direct visualization.  Hemostasis was maintained throughout the case.     A transfacial/transmesh suture with 0 stratafix used to primarily close defect under minimal tension in the right lateral lower aspect of the prior mesh.  The stratafix was then passed through the center of the mesh (coated side out) to secured the mesh to the abdominal wall centered over the defect.  The mesh was then circumferentially sutured into the  anterior abdominal wall using 3-0 VLock x3. (See final photo).  Any bleeding noted during this portion was no longer actively bleeding by end of securing mesh and tightening the suture.  The defect at the 11 o'clock position was closed with a second 0 Stratafix suture and due to the location near the falciform was not exposed to the bowel as there was peritoneal fat covering the area. I turned down the insufflation to ensure that the mesh was under minimal tension. All the sutures and needle were removed.   The robot was undocked.  The abdomen was then de-sufflated and the ports were removed.  All skin incisions closed with subcuticular 4-0 Monocryl and dermabond. Final inspection revealed acceptable hemostasis. I did note that the lateral right abdomen had a hard knot and this is likely from her old mesh and scar from the prior repairs where I closed the defect down and covered it with new mesh. I did not feel any signs of  recurrence, but made note of this hardness which felt like old scar/mesh in the area.   All counts were correct at the end of the case. The patient was awakened from anesthesia and extubated without complication. Her OG and foley were removed  The patient went to the PACU in stable condition. A binder was placed.    Angela Farrier, MD [facility] [address] [place], [place] [address] [phone] (office)

## 2024-03-03 NOTE — Consult Note (Signed)
 Initial Consultation Note   Patient: [name] [id number] DOB: 1951/04/25 PCP: Wilburn Handler, MD DOA: 03/03/2024 DOS: the patient was seen and examined on 03/03/2024 Primary service: Awilda Bogus, MD  Referring physician: Dr. Collene Dawson (Gen Surgery) Reason for consult: Chest Pain  Assessment/Plan: Assessment and Plan: 1)Atypical Chest Pain-  Cardiovascular risk factors include diabetes mellitus, HLD, HTN, status post menopause, age, PAD) -Patient with history of recurrent chest pain similar to prior -Please see cardiology office notes from 09/21/2022 and 05/31/2023 - Patient previously had coronary CTA in 05/2019 showing no CAD (coronary calcium  score of 0) and small secundum ASD (7.1 x 3.2 mm)--- coronary calcium  score was 0 -EKG on 03/05/2024 is sinus rhythm and non-acute, troponin 7 , repeat Troponin 6 - Chest x-ray without acute findings  --history of solid food dysphagia, and postprandial epigastric pain--please see GI office note dated 10/25/2023 - Place  on  telemetry monitored unit, - check serial troponins and EKG to rule out acute coronary syndrome .   If patient rules out for ACS by cardiac enzymes and EKG no further workup will be needed at this time unless echo is abnormal --Patient had relief with GI cocktail .-Continue atorvastatin  and metoprolol  - Hold off on aspirin  - Chest pain most likely GI etiology--she will continue Protonix , consider Tums and Carafate  2)DM2-A1c 12.3 reflecting uncontrolled DM with hyperglycemia PTA - Hold metformin  - Continue 70/30 insulin  -Use Novolog /Humalog Sliding scale insulin  with Accu-Cheks/Fingersticks as ordered   3)Mil hyponatremia and hypochloremia--most likely due to HCTZ use - Avoid dehydration  4)GERD with history of dysphagia and swallowing concerns--- continue Protonix  - Consider Carafate or Tums =-Previously declined EGD and colonoscopy - Outpatient follow-up with GI advised  5)HTN--able, continue amlodipine ,  HCTZ and metoprolol   6)HLD-continue atorvastatin  and Zetia   7)Leukocytosis---- WBC 12.3--in the postoperative patient - Suspect this is reactive - No fevers and chest x-rays nonacute - No urinary symptoms   TRH will sign off at present, please call us  again when needed.  HPI: [name] is a 73 y.o. female with past medical history of remarkable for GERD, dysphagia especially to solid food, episodes of recurrent atypical chest pains previously evaluated by cardiology team as outpatient, her Cardiovascular risk factors include diabetes mellitus, HLD, HTN, status post menopause, age, PAD) -Patient with history of recurrent chest pain similar to prior -Please see cardiology office notes from 09/21/2022 and 05/31/2023 - Patient previously had coronary CTA in 05/2019 showing no CAD (coronary calcium  score of 0) and small secundum ASD (7.1 x 3.2 mm)--- coronary calcium  score was 0 - History of solid food dysphagia, and postprandial epigastric pain--please see GI office note dated 10/25/2023 -EKG on 03/05/2024 is sinus rhythm and non-acute, troponin 7 , repeat Troponin 6 No fever  Or chills  Patient underwent incarcerated ventral hernia repair on 03/03/2024 -General Surgeon Dr. Collene Dawson requesting hospitalist consult due to episodes of chest pain after recovering from surgical procedure -Additional history obtained from patient's son at bedside as well as patient's sister and niece -No fever  Or chills  - No emesis -Denies cough or URI symptoms =-Chest x-ray on 03/03/2024 without acute findings - Sodium is 134, chloride is 97, potassium 3.7, creatinine 0.90, - WBC 12.3--in the postoperative patient - Hemoglobin 14.4  Review of Systems: As mentioned in the history of present illness. All other systems reviewed and are negative. Past Medical History:  Diagnosis Date   Asthma    Bronchitis    Diabetes mellitus    Hyperlipidemia  Hypertension    Interatrial cardiac shunt    a. small  secundum ASD vs pulmonary shunt.   Low back pain    Migraine    Mild carotid artery disease (HCC)    Osteoarthritis    Sciatica    Varicose veins    Vertigo    Past Surgical History:  Procedure Laterality Date   ABDOMINAL HYSTERECTOMY     BIOPSY  05/12/2019   Procedure: BIOPSY;  Surgeon: Alyce Jubilee, MD;  [facility];  Service: Endoscopy;;   CARPAL TUNNEL RELEASE Bilateral    CESAREAN SECTION     ESOPHAGOGASTRODUODENOSCOPY (EGD) WITH PROPOFOL  N/A 05/12/2019   Procedure: ESOPHAGOGASTRODUODENOSCOPY (EGD) WITH PROPOFOL ;  Surgeon: Alyce Jubilee, MD;  normal-appearing esophagus s/p empiric dilation, moderate gastritis due to ASA s/p biopsy, normal examined duodenum.  Biopsies with gastritis.    EXCISION OF MESH N/A 01/18/2022   Procedure: PARTIAL EXCISION OF PRIOR MESH;  Surgeon: Awilda Bogus, MD;  Location: [facility];  Service: General;  Laterality: N/A;   INCISIONAL HERNIA REPAIR N/A 03/15/2021   Procedure: Edrie Gower WITH MESH;  Surgeon: Awilda Bogus, MD;  Location: [facility];  Service: General;  Laterality: N/A;   INCISIONAL HERNIA REPAIR N/A 01/18/2022   Procedure: HERNIA REPAIR INCISIONAL W/ MESH;  Surgeon: Awilda Bogus, MD;  Location: [facility];  Service: General;  Laterality: N/A;   INGUINAL HERNIA REPAIR Left    LAPAROTOMY N/A 03/15/2021   Procedure: EXPLORATORY LAPAROTOMY;  Surgeon: Awilda Bogus, MD;  Location: [facility];  Service: General;  Laterality: N/A;   LUMBAR FUSION     LYSIS OF ADHESION N/A 03/15/2021   Procedure: LYSIS OF ADHESION;  Surgeon: Awilda Bogus, MD;  Location: [facility];  Service: General;  Laterality: N/A;   POSTERIOR LUMBAR FUSION 4 LEVEL N/A 03/04/2018   Procedure: Right transforaminal lumbar interbody fusion L1-2, L2-3 Posterior fusion T10, T11, T12, L1, L2 with T 11, T12 pedicle screws, superior sublaminar hooks T10, local bone graft, allograft cancellous chips Vivigen;  Surgeon: Alphonso Jean, MD;  Location: [facility];   Service: Orthopedics;  Laterality: N/A;   SAVORY DILATION N/A 05/12/2019   Procedure: SAVORY DILATION;  Surgeon: Alyce Jubilee, MD;  [facility];  Service: Endoscopy;  Laterality: N/A;   Social History:  reports that she has never smoked. She has never used smokeless tobacco. She reports that she does not drink alcohol and does not use drugs.  No Known Allergies  Family History  Problem Relation Age of Onset   Lung cancer Mother    Breast cancer Mother    Stroke Father    Heart failure Sister    Diabetes Sister    Heart failure Sister    Diabetes Sister    Colon cancer Neg Hx    Gastric cancer Neg Hx    Esophageal cancer Neg Hx     Prior to Admission medications   Medication Sig Start Date End Date Taking? Authorizing Provider  acetaminophen  (TYLENOL ) 500 MG tablet Take 2 tablets (1,000 mg total) by mouth every 8 (eight) hours as needed for mild pain or moderate pain. 03/21/23  Yes Diedra Fowler, MD  amLODipine  (NORVASC ) 10 MG tablet Take 10 mg by mouth daily. 01/02/21  Yes [provider]  aspirin  EC 81 MG tablet Take 81 mg by mouth daily. Swallow whole.   Yes [provider]  atorvastatin  (LIPITOR) 40 MG tablet Take 40 mg by mouth daily. 11/23/21  Yes [provider]  dapagliflozin propanediol (FARXIGA) 10 MG TABS tablet Take 10 mg by mouth daily.   Yes [provider]  diclofenac  (VOLTAREN ) 50 MG EC tablet TAKE 1 TABLET BY MOUTH TWICE DAILY AS NEEDED. TAKE WITH FOOD AND WATER 01/20/24  Yes Diedra Fowler, MD  dicyclomine  (BENTYL ) 10 MG capsule Take 1 capsule (10 mg total) by mouth 4 (four) times daily -  before meals and at bedtime. Patient taking differently: Take 10 mg by mouth 3 (three) times daily as needed for spasms. 02/01/22  Yes Awilda Bogus, MD  docusate sodium  (COLACE) 100 MG capsule Take 100 mg by mouth daily as needed for mild constipation.   Yes [provider]  ezetimibe  (ZETIA ) 10 MG tablet Take 1 tablet  by mouth once daily 09/27/23  Yes Mallipeddi, Vishnu P, MD  fluorometholone  (FML) 0.1 % ophthalmic suspension Place 1 drop into both eyes daily as needed (irritation). 02/24/21  Yes [provider]  gabapentin  (NEURONTIN ) 300 MG capsule Take 1 capsule (300 mg total) by mouth 3 (three) times daily. 12/12/23  Yes Diedra Fowler, MD  hydrochlorothiazide  (HYDRODIURIL ) 25 MG tablet Take 25 mg by mouth daily. 05/05/22  Yes [provider]  insulin  aspart protamine- aspart (NOVOLOG  MIX 70/30) (70-30) 100 UNIT/ML injection Inject 30 Units into the skin 2 (two) times daily before a meal.   Yes [provider]  magnesium  hydroxide (MILK OF MAGNESIA) 400 MG/5ML suspension Take 30 mLs by mouth daily as needed for mild constipation or moderate constipation. 01/21/22  Yes Awilda Bogus, MD  meloxicam  (MOBIC ) 15 MG tablet Take 15 mg by mouth every morning. 11/11/23  Yes [provider]  metFORMIN  (GLUCOPHAGE ) 1000 MG tablet Take 1,000 mg by mouth 2 (two) times daily.   Yes [provider]  methocarbamol  (ROBAXIN ) 500 MG tablet Take 1 tablet (500 mg total) by mouth 4 (four) times daily. 08/15/23  Yes Diedra Fowler, MD  metoprolol  tartrate (LOPRESSOR ) 25 MG tablet Take 1 tablet by mouth twice daily 09/27/23  Yes Mallipeddi, Vishnu P, MD  nitroGLYCERIN  (NITROSTAT ) 0.4 MG SL tablet Place 1 tablet (0.4 mg total) under the tongue every 5 (five) minutes as needed for chest pain. Dissolve one tablet under the tongue every 5 mintues as needed for chest pain. 09/21/22  Yes Mallipeddi, Vishnu P, MD  ondansetron  (ZOFRAN -ODT) 4 MG disintegrating tablet Take 1 tablet (4 mg total) by mouth every 6 (six) hours as needed for nausea. 02/01/22  Yes Awilda Bogus, MD  pantoprazole  (PROTONIX ) 40 MG tablet Take 1 tablet (40 mg total) by mouth 2 (two) times daily before a meal. 01/22/24  Yes Shana Daring S, PA-C  polyethylene glycol (MIRALAX  / GLYCOLAX ) 17 g packet Take 17 g by mouth  daily. 03/22/21  Yes Naveyah Iacovelli, MD  RYBELSUS 14 MG TABS Take 14 mg by mouth daily. 03/06/21  Yes [provider]  simethicone  (MYLICON) 80 MG chewable tablet Chew 0.5 tablets (40 mg total) by mouth every 6 (six) hours as needed for flatulence (bloating). 01/21/22  Yes Awilda Bogus, MD  AquaLance Lancets 30G MISC  04/05/21   [provider]  SURE COMFORT INSULIN  SYRINGE 31G X 5/16" 1 ML MISC  04/05/21   [provider]   Physical Exam: Vitals:   03/03/24 1200 03/03/24 1230 03/03/24 1232 03/03/24 1315  BP: (!) 130/58 (!) 114/56 (!) 114/56 (!) 124/57  Pulse: 81 81 83 85  Resp: 16 14 14  Temp:   98.4 F (36.9 C) 98.2 F (36.8 C)  TempSrc:    Oral  SpO2: 91% (!) 89% 94% 95%  Weight:      Height:        Physical Exam Gen:- Awake Alert, in no acute distress  HEENT:- [place].AT, No sclera icterus Neck-Supple Neck,No JVD,.  Lungs-  CTAB , fair air movement bilaterally  CV- S1, S2 normal, RRR Abd-  +ve B.Sounds, Abd Soft, epigastric discomfort, abdominal binder postop anterior abdominal wound noted  extremity/Skin:- No  edema,   good pedal pulses  Psych-affect is appropriate, oriented x3 Neuro-no new focal deficits, no tremors  Data Reviewed:  -EKG on 03/05/2024 is sinus rhythm and non-acute, troponin 7 , repeat Troponin 6 =-Chest x-ray on 03/03/2024 without acute findings - Sodium is 134, chloride is 97, potassium 3.7, creatinine 0.90, - WBC 12.3--in the postoperative patient - Hemoglobin 14.4  Family Communication: Discussed with patient's son, sister and niece at bedside Primary team communication: Discussed with  primary attending Dr. Collene Dawson Thank you very much for involving us  in the care of your patient.  Author: Colin Dawley, MD 03/03/2024 1:59 PM  For on call review [email or web address].

## 2024-03-03 NOTE — Progress Notes (Signed)
 [facility]  Patient with chest pain pressure after surgery. Says her chest feels like it is squeezing. She says she does take the nitroglycerin  about 1 time a month.   She had received some morphine  and says that it is still hurting. Sinus rhythm and no obvious changes on the EKG by my read.   4L [place] and satting well.  EKG Labs including troponin CXR ordered  Abd soft and mildly distended, binder in place, not complaining about the binder.  Given patient's symptoms post op, I will also get the hospitalist involved so they can help with workup and any further management, especially if any issues overnight.   Deena Farrier, MD [facility] [address] [place], [place] [address] [phone] (office)

## 2024-03-03 NOTE — Anesthesia Procedure Notes (Signed)
 Procedure Name: Intubation Date/Time: 03/03/2024 7:44 AM  Performed by: Leeanne Puffer, CRNAPre-anesthesia Checklist: Patient identified, Patient being monitored, Timeout performed, Emergency Drugs available and Suction available Patient Re-evaluated:Patient Re-evaluated prior to induction Oxygen  Delivery Method: Circle system utilized Preoxygenation: Pre-oxygenation with 100% oxygen  Induction Type: IV induction Ventilation: Mask ventilation without difficulty Laryngoscope Size: Mac and 3 Grade View: Grade I Tube type: Oral Tube size: 7.0 mm Number of attempts: 1 Airway Equipment and Method: Stylet Placement Confirmation: ETT inserted through vocal cords under direct vision, positive ETCO2 and breath sounds checked- equal and bilateral Secured at: 21 cm Tube secured with: Tape Dental Injury: Teeth and Oropharynx as per pre-operative assessment

## 2024-03-03 NOTE — TOC CM/SW Note (Signed)
 Transition of Care [facility]) - Inpatient Brief Assessment   Patient Details  Name: [name] MRN: [id number] Date of Birth: 1951/02/28  Transition of Care [facility]) CM/SW Contact:    Cyndie Dredge, LCSWA Phone Number: 03/03/2024, 2:00 PM   Clinical Narrative:  Transition of Care Department [facility]) has reviewed patient and no TOC needs have been identified at this time. We will continue to monitor patient advancement through interdisciplinary progression rounds. If new patient transition needs arise, please place a TOC consult.   Transition of Care Asessment: Insurance and Status: Insurance coverage has been reviewed Patient has primary care physician: Yes Home environment has been reviewed: Single Family Home Prior level of function:: Independent Prior/Current Home Services: No current home services Social Drivers of Health Review: SDOH reviewed no interventions necessary Readmission risk has been reviewed: Yes Transition of care needs: no transition of care needs at this time

## 2024-03-03 NOTE — Transfer of Care (Signed)
 Immediate Anesthesia Transfer of Care Note  Patient: [name]  Procedure(s) Performed: REPAIR, HERNIA, VENTRAL, ROBOT-ASSISTED (Abdomen) EXTENSIVE LYSIS, ADHESIONS, LAPAROSCOPIC (Abdomen)  Patient Location: PACU  Anesthesia Type:General  Level of Consciousness: awake  Airway & Oxygen  Therapy: Patient Spontanous Breathing  Post-op Assessment: Report given to RN  Post vital signs: Reviewed and stable  Last Vitals:  Vitals Value Taken Time  BP 119/56 03/03/24 1121  Temp 37 C 03/03/24 1121  Pulse 83 03/03/24 1128  Resp 18 03/03/24 1128  SpO2 87 % 03/03/24 1128  Vitals shown include unfiled device data.  Last Pain:  Vitals:   03/03/24 6045  TempSrc: Oral  PainSc: 0-No pain         Complications: No notable events documented.

## 2024-03-03 NOTE — Progress Notes (Signed)
 [facility]  CXR without obvious pathology. Lungs look clear.  Still with chest pain, so will give her one of her home nitroglycerin .   Dr. Quintella Buck to see the patient.  At this time, we are likely dealing with her baseline angina but will rule out any other issues.   Deena Farrier, MD [facility] [address] [place], [place] [address] [phone] (office)

## 2024-03-03 NOTE — Care Management Obs Status (Signed)
 MEDICARE OBSERVATION STATUS NOTIFICATION   Patient Details  Name: [name] MRN: [id number] Date of Birth: 1951/02/28   Medicare Observation Status Notification Given:  Yes    Geraldina Klinefelter, RN 03/03/2024, 6:33 PM

## 2024-03-03 NOTE — Interval H&P Note (Signed)
 History and Physical Interval Note:  03/03/2024 7:21 AM  [name]  has presented today for surgery, with the diagnosis of VENTRAL HERNIA, RECURRENT 5 CM.  The various methods of treatment have been discussed with the patient and family. After consideration of risks, benefits and other options for treatment, the patient has consented to  Procedure(s) with comments: REPAIR, HERNIA, VENTRAL, ROBOT-ASSISTED (N/A) - W/ MESH POSSIBLE OPEN, pt knows to arrive at 6:15 as a surgical intervention.  The patient's history has been reviewed, patient examined, no change in status, stable for surgery.  I have reviewed the patient's chart and labs.  Questions were answered to the patient's satisfaction.    Discussed Hgb A1c and elevation. She was not on her medication for about 2 months due to not having it and says she is now back on diabetes medication for the last week or more. She is having pain at the hernia site and is at risk for obstruction given the CT and concern for a flap of muscle or mesh that has the bowel sandwiched between her belly wall. Discussed that  the elevation increases her risk of infection and hernia recurrence. Discussed option of waiting and letting things improve but this would take months. She feels like her discomfort is too much at this time to wait right now. She will stay on her diabetes medications post operatively which should help her BS post op. Most recently she had BS around 200 and this would correspond to A1c closer to 8.5. have discussed risk and given the risk of obstruction and pain will proceed.   Her constipation is better with miralax .    Awilda Bogus

## 2024-03-04 ENCOUNTER — Encounter (HOSPITAL_COMMUNITY): Payer: Self-pay | Admitting: General Surgery

## 2024-03-04 ENCOUNTER — Observation Stay (HOSPITAL_BASED_OUTPATIENT_CLINIC_OR_DEPARTMENT_OTHER)

## 2024-03-04 ENCOUNTER — Other Ambulatory Visit (HOSPITAL_COMMUNITY): Payer: Self-pay | Admitting: *Deleted

## 2024-03-04 DIAGNOSIS — E785 Hyperlipidemia, unspecified: Secondary | ICD-10-CM | POA: Diagnosis not present

## 2024-03-04 DIAGNOSIS — K219 Gastro-esophageal reflux disease without esophagitis: Secondary | ICD-10-CM | POA: Diagnosis not present

## 2024-03-04 DIAGNOSIS — K432 Incisional hernia without obstruction or gangrene: Secondary | ICD-10-CM | POA: Diagnosis not present

## 2024-03-04 DIAGNOSIS — Z794 Long term (current) use of insulin: Secondary | ICD-10-CM | POA: Diagnosis not present

## 2024-03-04 DIAGNOSIS — E0822 Diabetes mellitus due to underlying condition with diabetic chronic kidney disease: Secondary | ICD-10-CM | POA: Diagnosis not present

## 2024-03-04 DIAGNOSIS — Z7984 Long term (current) use of oral hypoglycemic drugs: Secondary | ICD-10-CM | POA: Diagnosis not present

## 2024-03-04 DIAGNOSIS — Z79899 Other long term (current) drug therapy: Secondary | ICD-10-CM | POA: Diagnosis not present

## 2024-03-04 DIAGNOSIS — E871 Hypo-osmolality and hyponatremia: Secondary | ICD-10-CM | POA: Diagnosis not present

## 2024-03-04 DIAGNOSIS — E119 Type 2 diabetes mellitus without complications: Secondary | ICD-10-CM | POA: Diagnosis not present

## 2024-03-04 DIAGNOSIS — J45909 Unspecified asthma, uncomplicated: Secondary | ICD-10-CM | POA: Diagnosis not present

## 2024-03-04 DIAGNOSIS — R079 Chest pain, unspecified: Secondary | ICD-10-CM | POA: Diagnosis not present

## 2024-03-04 DIAGNOSIS — R0789 Other chest pain: Secondary | ICD-10-CM | POA: Diagnosis not present

## 2024-03-04 DIAGNOSIS — N181 Chronic kidney disease, stage 1: Secondary | ICD-10-CM | POA: Diagnosis not present

## 2024-03-04 DIAGNOSIS — K436 Other and unspecified ventral hernia with obstruction, without gangrene: Secondary | ICD-10-CM | POA: Diagnosis not present

## 2024-03-04 DIAGNOSIS — J438 Other emphysema: Secondary | ICD-10-CM | POA: Diagnosis not present

## 2024-03-04 DIAGNOSIS — Z7982 Long term (current) use of aspirin: Secondary | ICD-10-CM | POA: Diagnosis not present

## 2024-03-04 DIAGNOSIS — I10 Essential (primary) hypertension: Secondary | ICD-10-CM | POA: Diagnosis not present

## 2024-03-04 LAB — GLUCOSE, CAPILLARY
Glucose-Capillary: 118 mg/dL — ABNORMAL HIGH (ref 70–99)
Glucose-Capillary: 130 mg/dL — ABNORMAL HIGH (ref 70–99)
Glucose-Capillary: 133 mg/dL — ABNORMAL HIGH (ref 70–99)
Glucose-Capillary: 145 mg/dL — ABNORMAL HIGH (ref 70–99)
Glucose-Capillary: 160 mg/dL — ABNORMAL HIGH (ref 70–99)
Glucose-Capillary: 183 mg/dL — ABNORMAL HIGH (ref 70–99)

## 2024-03-04 LAB — CBC
HCT: 42.1 % (ref 36.0–46.0)
Hemoglobin: 13.2 g/dL (ref 12.0–15.0)
MCH: 29.9 pg (ref 26.0–34.0)
MCHC: 31.4 g/dL (ref 30.0–36.0)
MCV: 95.5 fL (ref 80.0–100.0)
Platelets: 215 10*3/uL (ref 150–400)
RBC: 4.41 MIL/uL (ref 3.87–5.11)
RDW: 13.1 % (ref 11.5–15.5)
WBC: 11.1 10*3/uL — ABNORMAL HIGH (ref 4.0–10.5)
nRBC: 0 % (ref 0.0–0.2)

## 2024-03-04 LAB — ECHOCARDIOGRAM COMPLETE
AR max vel: 2.11 cm2
AV Area VTI: 2.22 cm2
AV Area mean vel: 2.17 cm2
AV Mean grad: 4.8 mmHg
AV Peak grad: 9 mmHg
Ao pk vel: 1.5 m/s
Area-P 1/2: 3.55 cm2
Height: 65 in
S' Lateral: 3 cm
Weight: 2799.67 [oz_av]

## 2024-03-04 LAB — BASIC METABOLIC PANEL WITH GFR
Anion gap: 12 (ref 5–15)
BUN: 14 mg/dL (ref 8–23)
CO2: 26 mmol/L (ref 22–32)
Calcium: 8.5 mg/dL — ABNORMAL LOW (ref 8.9–10.3)
Chloride: 98 mmol/L (ref 98–111)
Creatinine, Ser: 0.89 mg/dL (ref 0.44–1.00)
GFR, Estimated: >60 mL/min (ref >60)
Glucose, Bld: 142 mg/dL — ABNORMAL HIGH (ref 70–99)
Potassium: 3.9 mmol/L (ref 3.5–5.1)
Sodium: 136 mmol/L (ref 135–145)

## 2024-03-04 MED ORDER — POLYETHYLENE GLYCOL 3350 17 G PO PACK
17.0000 g | PACK | Freq: Every day | ORAL | Status: DC
  Administered 2024-03-04 – 2024-03-06 (×3): 17 g via ORAL
  Filled 2024-03-04 (×3): qty 1

## 2024-03-04 MED ORDER — LIVING WELL WITH DIABETES BOOK: Freq: Once | Status: AC

## 2024-03-04 MED ORDER — PHENOL 1.4 % MT LIQD
1.0000 | OROMUCOSAL | Status: DC | PRN
  Administered 2024-03-05 (×2): 1 via OROMUCOSAL
  Filled 2024-03-04: qty 177

## 2024-03-04 NOTE — Progress Notes (Signed)
 [facility]  Doing better this AM. Feels some nausea but no vomiting. No more chest pain. No Bms.   BP (!) 107/52 (BP Location: Left Arm)   Pulse 65   Temp 98.3 F (36.8 C) (Oral)   Resp 16   Ht 5\' 5"  (1.651 m)   Wt 79.4 kg   SpO2 97%   BMI 29.12 kg/m  Soft, nondistended, tender appropriately, hard area on  right lower abdomen  Patient s/p robotic ventral hernia repair with mesh. Doing well.  PRN for pain IS, OOB Ambulate today Miralax  today Clear adv to soft Labs reassuring Home meds ordered  BS controlled for now with insulin    Deena Farrier, MD [facility] [address] [place], [place] [address] [phone] (office)

## 2024-03-04 NOTE — Progress Notes (Signed)
*  PRELIMINARY RESULTS* Echocardiogram 2D Echocardiogram has been performed.  [name] 03/04/2024, 9:29 AM

## 2024-03-04 NOTE — Plan of Care (Signed)
  Problem: Clinical Measurements: Goal: Will remain free from infection Outcome: Progressing   Problem: Nutrition: Goal: Adequate nutrition will be maintained Outcome: Progressing   Problem: Elimination: Goal: Will not experience complications related to bowel motility Outcome: Progressing

## 2024-03-04 NOTE — Progress Notes (Signed)
 Mobility Specialist Progress Note:    03/04/24 1402  Mobility  Activity Ambulated with assistance in hallway  Level of Assistance Contact guard assist, steadying assist  Assistive Device Front wheel walker  Distance Ambulated (ft) 70 ft  Range of Motion/Exercises Active;All extremities  Activity Response Tolerated well  Mobility Referral Yes  Mobility visit 1 Mobility  Mobility Specialist Start Time (ACUTE ONLY) 1340  Mobility Specialist Stop Time (ACUTE ONLY) 1402  Mobility Specialist Time Calculation (min) (ACUTE ONLY) 22 min   Pt received in bathroom, agreeable to further mobility. Required CGA to stand and SBA to ambulate with RW. Tolerated well, rated pain an 8/10 after ambulation. Nurse in room, left pt sitting EOB. All needs met.   Glinda Lapping Mobility Specialist Please contact via Special educational needs teacher or  Rehab office at [phone]

## 2024-03-04 NOTE — Inpatient Diabetes Management (Addendum)
 Inpatient Diabetes Program Recommendations  AACE/ADA: New Consensus Statement on Inpatient Glycemic Control   Target Ranges:  Prepandial:   less than 140 mg/dL      Peak postprandial:   less than 180 mg/dL (1-2 hours)      Critically ill patients:  140 - 180 mg/dL    Latest Reference Range & Units 03/04/24 00:27 03/04/24 04:33 03/04/24 07:17 03/04/24 11:25  Glucose-Capillary 70 - 99 mg/dL 960 (H) 454 (H) 098 (H) 130 (H)    Latest Reference Range & Units 03/03/24 06:22 03/03/24 11:28 03/03/24 12:32 03/03/24 16:27 03/03/24 20:06  Glucose-Capillary 70 - 99 mg/dL 119 (H) 147 (H) 829 (H) 256 (H) 248 (H)    Latest Reference Range & Units 02/28/24 13:29  Hemoglobin A1C 4.8 - 5.6 % 12.3 (H)   Review of Glycemic Control  Diabetes history: DM2 Outpatient Diabetes medications: Novolin 70/30 30 units BID, Farxiga 10 mg daily (not taking), Metformin  1000 mg BID, Rybelsus 14 mg daily Current orders for Inpatient glycemic control: Novolog  70/30 30 units BID, Novolog  0-15 units TID with meals  Inpatient Diabetes Program Recommendations:    HbgA1C: A1C 12.3% on 02/28/24 indicating an average glucose of 306 mg/dl over the past 2-3 months.  NOTE: Patient admitted on 03/03/24 following hernia surgery. Patient received Decadron  8 mg at 8:05 am on 03/03/24. In reviewing chart, noted telephone notes from [facility] and pharmacy techs with [facility] in April and May 2025. Per notes, patient has medication assistance for Rybelsus and Novolin 70/30.   Addendum 5/21@13 :30-Spoke with patient and sister at bedside about diabetes and home regimen for diabetes control. Patient reports being followed by PCP for diabetes management and currently taking 70/30 30 units BID, Metformin  1000 mg BID, and Rybelsus 14 mg daily as an outpatient for diabetes control. Patient reports that she is not taking Farxiga; she had taken it briefly after she ran out of Rybelsus.  Patient reports she had ran out of Rybelsus for several weeks and  she just recently got restarted on it.   Patient reports checking glucose 2 times per day and that it is usually in the 200's mg/dl.  Discussed A1C results (12.3% on 02/28/24) and explained that current A1C indicates an average glucose of 306 mg/dl over the past 2-3 months. Discussed glucose and A1C goals. Discussed importance of checking CBGs and maintaining good CBG control to prevent long-term and short-term complications. Explained how hyperglycemia leads to damage within blood vessels which lead to the common complications seen with uncontrolled diabetes. Stressed to the patient the importance of improving glycemic control to prevent further complications from uncontrolled diabetes and to decrease risk of complications following surgery. Discussed impact of nutrition, exercise, stress, sickness, and medications on diabetes control.  Patient states that she has not been following a carb modified diet, has been eating sweets, fruit frequently, and high carb foods.  Discussed carbohydrates, carbohydrate goals per day and meal, along with portion sizes. Patient reports she will plan to start watching carbohydrates and will limit sweets and fruit.  Encouraged patient to reach out to her PCP if CBGs are still consistently over 180 mg/dl at home. Patient reports she has all needed DM medications and supplies.  Patient verbalized understanding of information discussed and reports no further questions at this time related to diabetes.  Thanks, [name], RN, MSN, CDCES Diabetes Coordinator Inpatient Diabetes Program [phone] (Team Pager from 8am to 5pm)

## 2024-03-04 NOTE — Anesthesia Postprocedure Evaluation (Signed)
 Anesthesia Post Note  Patient: [name]  Procedure(s) Performed: REPAIR, HERNIA, VENTRAL, ROBOT-ASSISTED (Abdomen) EXTENSIVE LYSIS, ADHESIONS, LAPAROSCOPIC (Abdomen)  Patient location during evaluation: PACU Anesthesia Type: General Level of consciousness: awake and alert Pain management: pain level controlled Vital Signs Assessment: post-procedure vital signs reviewed and stable Respiratory status: spontaneous breathing, nonlabored ventilation, respiratory function stable and patient connected to nasal cannula oxygen  Cardiovascular status: blood pressure returned to baseline and stable Postop Assessment: no apparent nausea or vomiting Anesthetic complications: no  No notable events documented.   Last Vitals:  Vitals:   03/04/24 0128 03/04/24 0432  BP: (!) 110/56 (!) 107/52  Pulse: 68 65  Resp: 18 16  Temp: 36.7 C 36.8 C  SpO2: 96% 97%    Last Pain:  Vitals:   03/04/24 0432  TempSrc: Oral  PainSc:                  [name]

## 2024-03-05 DIAGNOSIS — E871 Hypo-osmolality and hyponatremia: Secondary | ICD-10-CM | POA: Diagnosis not present

## 2024-03-05 DIAGNOSIS — R079 Chest pain, unspecified: Secondary | ICD-10-CM | POA: Diagnosis not present

## 2024-03-05 DIAGNOSIS — E119 Type 2 diabetes mellitus without complications: Secondary | ICD-10-CM | POA: Diagnosis not present

## 2024-03-05 DIAGNOSIS — K436 Other and unspecified ventral hernia with obstruction, without gangrene: Secondary | ICD-10-CM | POA: Diagnosis not present

## 2024-03-05 DIAGNOSIS — Z7982 Long term (current) use of aspirin: Secondary | ICD-10-CM | POA: Diagnosis not present

## 2024-03-05 DIAGNOSIS — Z7984 Long term (current) use of oral hypoglycemic drugs: Secondary | ICD-10-CM | POA: Diagnosis not present

## 2024-03-05 DIAGNOSIS — K432 Incisional hernia without obstruction or gangrene: Secondary | ICD-10-CM | POA: Diagnosis not present

## 2024-03-05 DIAGNOSIS — I10 Essential (primary) hypertension: Secondary | ICD-10-CM | POA: Diagnosis not present

## 2024-03-05 DIAGNOSIS — Z79899 Other long term (current) drug therapy: Secondary | ICD-10-CM | POA: Diagnosis not present

## 2024-03-05 DIAGNOSIS — E785 Hyperlipidemia, unspecified: Secondary | ICD-10-CM | POA: Diagnosis not present

## 2024-03-05 DIAGNOSIS — Z794 Long term (current) use of insulin: Secondary | ICD-10-CM | POA: Diagnosis not present

## 2024-03-05 DIAGNOSIS — J438 Other emphysema: Secondary | ICD-10-CM | POA: Diagnosis not present

## 2024-03-05 DIAGNOSIS — J45909 Unspecified asthma, uncomplicated: Secondary | ICD-10-CM | POA: Diagnosis not present

## 2024-03-05 DIAGNOSIS — K219 Gastro-esophageal reflux disease without esophagitis: Secondary | ICD-10-CM | POA: Diagnosis not present

## 2024-03-05 LAB — CBC
HCT: 42.4 % (ref 36.0–46.0)
Hemoglobin: 13.2 g/dL (ref 12.0–15.0)
MCH: 29.7 pg (ref 26.0–34.0)
MCHC: 31.1 g/dL (ref 30.0–36.0)
MCV: 95.3 fL (ref 80.0–100.0)
Platelets: 204 10*3/uL (ref 150–400)
RBC: 4.45 MIL/uL (ref 3.87–5.11)
RDW: 13.3 % (ref 11.5–15.5)
WBC: 13.7 10*3/uL — ABNORMAL HIGH (ref 4.0–10.5)
nRBC: 0 % (ref 0.0–0.2)

## 2024-03-05 LAB — GLUCOSE, CAPILLARY
Glucose-Capillary: 100 mg/dL — ABNORMAL HIGH (ref 70–99)
Glucose-Capillary: 136 mg/dL — ABNORMAL HIGH (ref 70–99)
Glucose-Capillary: 137 mg/dL — ABNORMAL HIGH (ref 70–99)
Glucose-Capillary: 140 mg/dL — ABNORMAL HIGH (ref 70–99)
Glucose-Capillary: 160 mg/dL — ABNORMAL HIGH (ref 70–99)
Glucose-Capillary: 162 mg/dL — ABNORMAL HIGH (ref 70–99)
Glucose-Capillary: 67 mg/dL — ABNORMAL LOW (ref 70–99)

## 2024-03-05 LAB — BASIC METABOLIC PANEL WITH GFR
Anion gap: 7 (ref 5–15)
BUN: 15 mg/dL (ref 8–23)
CO2: 31 mmol/L (ref 22–32)
Calcium: 8.6 mg/dL — ABNORMAL LOW (ref 8.9–10.3)
Chloride: 96 mmol/L — ABNORMAL LOW (ref 98–111)
Creatinine, Ser: 0.8 mg/dL (ref 0.44–1.00)
GFR, Estimated: >60 mL/min (ref >60)
Glucose, Bld: 134 mg/dL — ABNORMAL HIGH (ref 70–99)
Potassium: 3.6 mmol/L (ref 3.5–5.1)
Sodium: 134 mmol/L — ABNORMAL LOW (ref 135–145)

## 2024-03-05 MED ORDER — ACETAMINOPHEN 325 MG PO TABS
650.0000 mg | ORAL_TABLET | Freq: Four times a day (QID) | ORAL | Status: DC | PRN
  Administered 2024-03-05: 650 mg via ORAL
  Filled 2024-03-05: qty 2

## 2024-03-05 MED ORDER — METFORMIN HCL 500 MG PO TABS
1000.0000 mg | ORAL_TABLET | Freq: Two times a day (BID) | ORAL | Status: DC
  Administered 2024-03-05 – 2024-03-06 (×2): 1000 mg via ORAL
  Filled 2024-03-05 (×2): qty 2

## 2024-03-05 NOTE — Progress Notes (Signed)
 Pt c/o sore throat, chloraseptic mouth spray ordered & given w little relief reported. Temp 100.4 this a.m., MD made aware. Tylenol  ordered for pt.   CBG 67 during the night, juice given and CBG up to 100.   0/10 pain reported during the night.  Unable to wean 02, encouraged incentive spirometer use.

## 2024-03-05 NOTE — Inpatient Diabetes Management (Signed)
 Inpatient Diabetes Program Recommendations  AACE/ADA: New Consensus Statement on Inpatient Glycemic Control   Target Ranges:  Prepandial:   less than 140 mg/dL      Peak postprandial:   less than 180 mg/dL (1-2 hours)      Critically ill patients:  140 - 180 mg/dL    Latest Reference Range & Units 03/05/24 00:23 03/05/24 00:44 03/05/24 04:15 03/05/24 07:25  Glucose-Capillary 70 - 99 mg/dL 67 (L) 409 (H) 811 (H) 160 (H)    Latest Reference Range & Units 03/04/24 07:17 03/04/24 11:25 03/04/24 15:41 03/04/24 19:41  Glucose-Capillary 70 - 99 mg/dL 914 (H) 782 (H) 956 (H) 118 (H)    Latest Reference Range & Units 02/28/24 13:29  Hemoglobin A1C 4.8 - 5.6 % 12.3 (H)   Review of Glycemic Control  Diabetes history: DM2 Outpatient Diabetes medications: Novolin 70/30 30 units BID, Farxiga 10 mg daily (not taking), Metformin  1000 mg BID, Rybelsus 14 mg daily Current orders for Inpatient glycemic control: Novolog  70/30 30 units BID, Novolog  0-15 units TID with meals   Inpatient Diabetes Program Recommendations:     Insulin : Please consider changing 70/30 to 32 units QAM with breakfast and 70/30 27 units QPM with supper.  HbgA1C: A1C 12.3% on 02/28/24 indicating an average glucose of 306 mg/dl over the past 2-3 months.   Thanks, Angela Limerick, RN, MSN, CDCES Diabetes Coordinator Inpatient Diabetes Program [phone] (Team Pager from 8am to 5pm)

## 2024-03-05 NOTE — Progress Notes (Signed)
 Mobility Specialist Progress Note:    03/05/24 0920  Mobility  Activity Ambulated with assistance in hallway  Level of Assistance Standby assist, set-up cues, supervision of patient - no hands on  Assistive Device Front wheel walker  Distance Ambulated (ft) 75 ft  Range of Motion/Exercises Active;All extremities  Activity Response Tolerated well  Mobility Referral Yes  Mobility visit 1 Mobility  Mobility Specialist Start Time (ACUTE ONLY) 0900  Mobility Specialist Stop Time (ACUTE ONLY) 0920  Mobility Specialist Time Calculation (min) (ACUTE ONLY) 20 min   Pt received sitting EOB, agreeable to mobility. Required SBA to stand and ambulate with RW. Tolerated well, asx throughout. Returned to room, left pt sitting EOB. All needs met.  Glinda Lapping Mobility Specialist Please contact via Special educational needs teacher or  Rehab office at [phone]

## 2024-03-05 NOTE — Progress Notes (Signed)
 2 Days Post-Op  Subjective: Pt. reports still feeling abdominal pain from her surgery. She is able to pass gas and did have a small bowel movement once last night that was not completely formed. She has not had any difficulties with urination. She was able to eat dinner without nausea or vomiting. She has also not had any issues with drinking fluids. She stated that yesterday she felt as though she was getting better until she started to have a sore throat last night.   Objective: Vital signs in last 24 hours: Temp:  [98 F (36.7 C)-100.4 F (38 C)] 100.4 F (38 C) (05/22 0425) Pulse Rate:  [70-81] 81 (05/22 0425) Resp:  [17-20] 17 (05/22 0425) BP: (95-109)/(52-60) 103/56 (05/22 0425) SpO2:  [92 %-96 %] 92 % (05/22 0425) Last BM Date : 03/04/24  Intake/Output from previous day: 05/21 0701 - 05/22 0700 In: 740 [P.O.:740] Out: -  Intake/Output this shift: No intake/output data recorded.  General appearance: alert, cooperative, fatigued, and mild distress Resp: clear to auscultation bilaterally Cardio: regular rate and rhythm, S1, S2 normal, no murmur, click, rub or gallop GI: Normal bowel sounds. Soft and nondistended. Diffusely tender to palpation.  Incision/Wound: Non-erythematous with no discharge. Tender to palpation. Stitches remain in place.   Lab Results:  Recent Labs    03/04/24 0409 03/05/24 0415  WBC 11.1* 13.7*  HGB 13.2 13.2  HCT 42.1 42.4  PLT 215 204   BMET Recent Labs    03/04/24 0409 03/05/24 0415  NA 136 134*  K 3.9 3.6  CL 98 96*  CO2 26 31  GLUCOSE 142* 134*  BUN 14 15  CREATININE 0.89 0.80  CALCIUM  8.5* 8.6*   PT/INR No results for input(s): "LABPROT", "INR" in the last 72 hours.  Studies/Results: ECHOCARDIOGRAM COMPLETE Result Date: 03/04/2024    ECHOCARDIOGRAM REPORT   Patient Name:   RAUL WINTERHALTER Date of Exam: 03/04/2024 Medical Rec #:  [id number]             Height:       65.0 in Accession #:    [id number]            Weight:        175.0 lb Date of Birth:  03/30/1951             BSA:          1.869 m Patient Age:    73 years              BP:           107/52 mmHg Patient Gender: F                     HR:           65 bpm. Exam [facility] Procedure: 2D Echo, Cardiac Doppler and Color Doppler (Both Spectral and Color            Flow Doppler were utilized during procedure). Indications:     Chest Pain R07.9  History:         Patient has prior history of Echocardiogram examinations, most                  recent 08/15/2018. Risk Factors:Hypertension, Diabetes and                  Dyslipidemia.  Sonographer:     Denese Finn RCS Referring Phys:  [id number] COURAGE EMOKPAE Diagnosing Phys: Armida Lander  MD IMPRESSIONS  1. Left ventricular ejection fraction, by estimation, is 60 to 65%. The left ventricle has normal function. The left ventricle has no regional wall motion abnormalities. Left ventricular diastolic parameters were normal.  2. Right ventricular systolic function is normal. The right ventricular size is normal. There is normal pulmonary artery systolic pressure.  3. The mitral valve is normal in structure. No evidence of mitral valve regurgitation. No evidence of mitral stenosis.  4. The aortic valve is tricuspid. There is mild calcification of the aortic valve. There is mild thickening of the aortic valve. Aortic valve regurgitation is not visualized. No aortic stenosis is present.  5. The inferior vena cava is normal in size with greater than 50% respiratory variability, suggesting right atrial pressure of 3 mmHg. FINDINGS  Left Ventricle: Left ventricular ejection fraction, by estimation, is 60 to 65%. The left ventricle has normal function. The left ventricle has no regional wall motion abnormalities. The left ventricular internal cavity size was normal in size. There is  no left ventricular hypertrophy. Left ventricular diastolic parameters were normal. Right Ventricle: The right ventricular size is normal. Right vetricular  wall thickness was not well visualized. Right ventricular systolic function is normal. There is normal pulmonary artery systolic pressure. The tricuspid regurgitant velocity is 2.64 m/s, and with an assumed right atrial pressure of 3 mmHg, the estimated right ventricular systolic pressure is 30.9 mmHg. Left Atrium: Left atrial size was normal in size. Right Atrium: Right atrial size was normal in size. Pericardium: There is no evidence of pericardial effusion. Mitral Valve: The mitral valve is normal in structure. There is mild thickening of the mitral valve leaflet(s). There is mild calcification of the mitral valve leaflet(s). Mild mitral annular calcification. No evidence of mitral valve regurgitation. No evidence of mitral valve stenosis. Tricuspid Valve: The tricuspid valve is normal in structure. Tricuspid valve regurgitation is mild . No evidence of tricuspid stenosis. Aortic Valve: The aortic valve is tricuspid. There is mild calcification of the aortic valve. There is mild thickening of the aortic valve. There is mild aortic valve annular calcification. Aortic valve regurgitation is not visualized. No aortic stenosis  is present. Aortic valve mean gradient measures 4.8 mmHg. Aortic valve peak gradient measures 9.0 mmHg. Aortic valve area, by VTI measures 2.22 cm. Pulmonic Valve: The pulmonic valve was not well visualized. Pulmonic valve regurgitation is not visualized. No evidence of pulmonic stenosis. Aorta: The aortic root is normal in size and structure. Venous: The inferior vena cava is normal in size with greater than 50% respiratory variability, suggesting right atrial pressure of 3 mmHg. IAS/Shunts: The interatrial septum was not well visualized.  LEFT VENTRICLE PLAX 2D LVIDd:         4.60 cm   Diastology LVIDs:         3.00 cm   LV e' medial:    8.05 cm/s LV PW:         0.90 cm   LV E/e' medial:  12.4 LV IVS:        1.10 cm   LV e' lateral:   10.10 cm/s LVOT diam:     2.00 cm   LV E/e' lateral: 9.9  LV SV:         76 LV SV Index:   41 LVOT Area:     3.14 cm  RIGHT VENTRICLE RV S prime:     9.57 cm/s TAPSE (M-mode): 1.8 cm LEFT ATRIUM  Index        RIGHT ATRIUM           Index LA diam:        3.50 cm 1.87 cm/m   RA Area:     14.80 cm LA Vol (A2C):   54.1 ml 28.95 ml/m  RA Volume:   38.00 ml  20.33 ml/m LA Vol (A4C):   46.7 ml 24.99 ml/m LA Biplane Vol: 50.9 ml 27.24 ml/m  AORTIC VALVE AV Area (Vmax):    2.11 cm AV Area (Vmean):   2.17 cm AV Area (VTI):     2.22 cm AV Vmax:           150.40 cm/s AV Vmean:          104.948 cm/s AV VTI:            0.340 m AV Peak Grad:      9.0 mmHg AV Mean Grad:      4.8 mmHg LVOT Vmax:         101.00 cm/s LVOT Vmean:        72.400 cm/s LVOT VTI:          0.241 m LVOT/AV VTI ratio: 0.71  AORTA Ao Root diam: 3.40 cm Ao Asc diam:  3.20 cm MITRAL VALVE               TRICUSPID VALVE MV Area (PHT): 3.55 cm    TR Peak grad:   27.9 mmHg MV Decel Time: 214 msec    TR Vmax:        264.00 cm/s MV E velocity: 99.70 cm/s MV A velocity: 81.85 cm/s  SHUNTS MV E/A ratio:  1.22        Systemic VTI:  0.24 m                            Systemic Diam: 2.00 cm Armida Lander MD Electronically signed by Armida Lander MD Signature Date/Time: 03/04/2024/1:14:08 PM    Final (Updated)    DG Chest Port 1 View Result Date: 03/03/2024 CLINICAL DATA:  Chest pain. EXAM: PORTABLE CHEST 1 VIEW COMPARISON:  08/27/2022. FINDINGS: Low lung volume. Bilateral lung fields are clear. Bilateral costophrenic angles are clear. Redemonstration of markedly elevated right hemidiaphragm. Normal cardio-mediastinal silhouette. No acute osseous abnormalities. Thoracolumbar spinal fixation hardware noted. There are surgical clips in the right upper quadrant, typical of a previous cholecystectomy. The soft tissues are within normal limits. IMPRESSION: No active disease. Electronically Signed   By: Beula Brunswick M.D.   On: 03/03/2024 13:44    Anti-infectives: Anti-infectives (From admission, onward)     Start     Dose/Rate Route Frequency Ordered Stop   03/03/24 1600  ceFAZolin  (ANCEF ) IVPB 2g/100 mL premix        2 g 200 mL/hr over 30 Minutes Intravenous Every 8 hours 03/03/24 1245 03/03/24 1703   03/03/24 0615  ceFAZolin  (ANCEF ) IVPB 2g/100 mL premix        2 g 200 mL/hr over 30 Minutes Intravenous On call to O.R. 03/03/24 1610 03/03/24 0815       Assessment/Plan: s/p Procedure(s): REPAIR, HERNIA, VENTRAL, ROBOT-ASSISTED EXTENSIVE LYSIS, ADHESIONS, LAPAROSCOPIC Discharge Monitor febrile and sore throat status.  LOS: 0 days    Jesilyn Easom 03/05/2024

## 2024-03-05 NOTE — Progress Notes (Signed)
 [facility] Progress Note  2 Days Post-Op  Subjective: Had low grade fever, likely from atelectasis. Having some pain but controlled with meds. Had a small bm and only taking in a little food. BP has been soft, but is on home medication.   Objective: Vital signs in last 24 hours: Temp:  [98 F (36.7 C)-100.4 F (38 C)] 99.5 F (37.5 C) (05/22 0900) Pulse Rate:  [67-81] 67 (05/22 0900) Resp:  [17-20] 17 (05/22 0425) BP: (95-109)/(52-60) 97/54 (05/22 0900) SpO2:  [92 %-96 %] 92 % (05/22 0900) Last BM Date : 03/04/24  Intake/Output from previous day: 05/21 0701 - 05/22 0700 In: 740 [P.O.:740] Out: -  Intake/Output this shift: Total I/O In: 300 [P.O.:300] Out: -   General appearance: alert and no distress Resp: IS- pulls about 1000+ GI: soft, mildly distended, indurated area right of umbilicus noted, some tenderness in this area (likely from the old mesh I could not explant)  port sites with Dermabond, no erythema or drainage   Lab Results:  Recent Labs    03/04/24 0409 03/05/24 0415  WBC 11.1* 13.7*  HGB 13.2 13.2  HCT 42.1 42.4  PLT 215 204   BMET Recent Labs    03/04/24 0409 03/05/24 0415  NA 136 134*  K 3.9 3.6  CL 98 96*  CO2 26 31  GLUCOSE 142* 134*  BUN 14 15  CREATININE 0.89 0.80  CALCIUM  8.5* 8.6*   PT/INR No results for input(s): "LABPROT", "INR" in the last 72 hours.  Studies/Results: ECHOCARDIOGRAM COMPLETE Result Date: 03/04/2024    ECHOCARDIOGRAM REPORT   Patient Name:   [name] Date of Exam: 03/04/2024 Medical Rec #:  [id number]             Height:       65.0 in Accession #:    [id number]            Weight:       175.0 lb Date of Birth:  10/06/1951             BSA:          1.869 m Patient Age:    73 years              BP:           107/52 mmHg Patient Gender: F                     HR:           65 bpm. Exam Location:  [facility] Procedure: 2D Echo, Cardiac Doppler and Color Doppler (Both Spectral and Color             Flow Doppler were utilized during procedure). Indications:     Chest Pain R07.9  History:         Patient has prior history of Echocardiogram examinations, most                  recent 08/15/2018. Risk Factors:Hypertension, Diabetes and                  Dyslipidemia.  Sonographer:     Denese Finn RCS Referring Phys:  [id number] COURAGE EMOKPAE Diagnosing Phys: Armida Lander MD IMPRESSIONS  1. Left ventricular ejection fraction, by estimation, is 60 to 65%. The left ventricle has normal function. The left ventricle has no regional wall motion abnormalities. Left ventricular diastolic parameters were normal.  2. Right ventricular systolic function is normal.  The right ventricular size is normal. There is normal pulmonary artery systolic pressure.  3. The mitral valve is normal in structure. No evidence of mitral valve regurgitation. No evidence of mitral stenosis.  4. The aortic valve is tricuspid. There is mild calcification of the aortic valve. There is mild thickening of the aortic valve. Aortic valve regurgitation is not visualized. No aortic stenosis is present.  5. The inferior vena cava is normal in size with greater than 50% respiratory variability, suggesting right atrial pressure of 3 mmHg. FINDINGS  Left Ventricle: Left ventricular ejection fraction, by estimation, is 60 to 65%. The left ventricle has normal function. The left ventricle has no regional wall motion abnormalities. The left ventricular internal cavity size was normal in size. There is  no left ventricular hypertrophy. Left ventricular diastolic parameters were normal. Right Ventricle: The right ventricular size is normal. Right vetricular wall thickness was not well visualized. Right ventricular systolic function is normal. There is normal pulmonary artery systolic pressure. The tricuspid regurgitant velocity is 2.64 m/s, and with an assumed right atrial pressure of 3 mmHg, the estimated right ventricular systolic pressure is 30.9 mmHg. Left  Atrium: Left atrial size was normal in size. Right Atrium: Right atrial size was normal in size. Pericardium: There is no evidence of pericardial effusion. Mitral Valve: The mitral valve is normal in structure. There is mild thickening of the mitral valve leaflet(s). There is mild calcification of the mitral valve leaflet(s). Mild mitral annular calcification. No evidence of mitral valve regurgitation. No evidence of mitral valve stenosis. Tricuspid Valve: The tricuspid valve is normal in structure. Tricuspid valve regurgitation is mild . No evidence of tricuspid stenosis. Aortic Valve: The aortic valve is tricuspid. There is mild calcification of the aortic valve. There is mild thickening of the aortic valve. There is mild aortic valve annular calcification. Aortic valve regurgitation is not visualized. No aortic stenosis  is present. Aortic valve mean gradient measures 4.8 mmHg. Aortic valve peak gradient measures 9.0 mmHg. Aortic valve area, by VTI measures 2.22 cm. Pulmonic Valve: The pulmonic valve was not well visualized. Pulmonic valve regurgitation is not visualized. No evidence of pulmonic stenosis. Aorta: The aortic root is normal in size and structure. Venous: The inferior vena cava is normal in size with greater than 50% respiratory variability, suggesting right atrial pressure of 3 mmHg. IAS/Shunts: The interatrial septum was not well visualized.  LEFT VENTRICLE PLAX 2D LVIDd:         4.60 cm   Diastology LVIDs:         3.00 cm   LV e' medial:    8.05 cm/s LV PW:         0.90 cm   LV E/e' medial:  12.4 LV IVS:        1.10 cm   LV e' lateral:   10.10 cm/s LVOT diam:     2.00 cm   LV E/e' lateral: 9.9 LV SV:         76 LV SV Index:   41 LVOT Area:     3.14 cm  RIGHT VENTRICLE RV S prime:     9.57 cm/s TAPSE (M-mode): 1.8 cm LEFT ATRIUM             Index        RIGHT ATRIUM           Index LA diam:        3.50 cm 1.87 cm/m   RA Area:  14.80 cm LA Vol (A2C):   54.1 ml 28.95 ml/m  RA Volume:   38.00  ml  20.33 ml/m LA Vol (A4C):   46.7 ml 24.99 ml/m LA Biplane Vol: 50.9 ml 27.24 ml/m  AORTIC VALVE AV Area (Vmax):    2.11 cm AV Area (Vmean):   2.17 cm AV Area (VTI):     2.22 cm AV Vmax:           150.40 cm/s AV Vmean:          104.948 cm/s AV VTI:            0.340 m AV Peak Grad:      9.0 mmHg AV Mean Grad:      4.8 mmHg LVOT Vmax:         101.00 cm/s LVOT Vmean:        72.400 cm/s LVOT VTI:          0.241 m LVOT/AV VTI ratio: 0.71  AORTA Ao Root diam: 3.40 cm Ao Asc diam:  3.20 cm MITRAL VALVE               TRICUSPID VALVE MV Area (PHT): 3.55 cm    TR Peak grad:   27.9 mmHg MV Decel Time: 214 msec    TR Vmax:        264.00 cm/s MV E velocity: 99.70 cm/s MV A velocity: 81.85 cm/s  SHUNTS MV E/A ratio:  1.22        Systemic VTI:  0.24 m                            Systemic Diam: 2.00 cm Armida Lander MD Electronically signed by Armida Lander MD Signature Date/Time: 03/04/2024/1:14:08 PM    Final (Updated)    DG Chest Port 1 View Result Date: 03/03/2024 CLINICAL DATA:  Chest pain. EXAM: PORTABLE CHEST 1 VIEW COMPARISON:  08/27/2022. FINDINGS: Low lung volume. Bilateral lung fields are clear. Bilateral costophrenic angles are clear. Redemonstration of markedly elevated right hemidiaphragm. Normal cardio-mediastinal silhouette. No acute osseous abnormalities. Thoracolumbar spinal fixation hardware noted. There are surgical clips in the right upper quadrant, typical of a previous cholecystectomy. The soft tissues are within normal limits. IMPRESSION: No active disease. Electronically Signed   By: Beula Brunswick M.D.   On: 03/03/2024 13:44    Anti-infectives: Anti-infectives (From admission, onward)    Start     Dose/Rate Route Frequency Ordered Stop   03/03/24 1600  ceFAZolin  (ANCEF ) IVPB 2g/100 mL premix        2 g 200 mL/hr over 30 Minutes Intravenous Every 8 hours 03/03/24 1245 03/03/24 1703   03/03/24 0615  ceFAZolin  (ANCEF ) IVPB 2g/100 mL premix        2 g 200 mL/hr over 30 Minutes  Intravenous On call to O.R. 03/03/24 6045 03/03/24 0815       Assessment/Plan: Patient s/p robotic ventral hernia repair with mesh. Doing well. Has not taken in much and only had small Bm. RN has weaned off O2. Low grade temp likely from atelectasis.   PRN For pain Is, OOB Ambulate Soft diet Bowel regimen Binder CBC tomorrow, mild leukocytosis likely inflammation post op, monitor Home tomorrow after off O2 and moving around more today    LOS: 0 days    Awilda Bogus 03/05/2024

## 2024-03-06 DIAGNOSIS — R0789 Other chest pain: Secondary | ICD-10-CM | POA: Diagnosis not present

## 2024-03-06 DIAGNOSIS — E0822 Diabetes mellitus due to underlying condition with diabetic chronic kidney disease: Secondary | ICD-10-CM

## 2024-03-06 DIAGNOSIS — E785 Hyperlipidemia, unspecified: Secondary | ICD-10-CM | POA: Diagnosis not present

## 2024-03-06 DIAGNOSIS — Z7982 Long term (current) use of aspirin: Secondary | ICD-10-CM | POA: Diagnosis not present

## 2024-03-06 DIAGNOSIS — J438 Other emphysema: Secondary | ICD-10-CM | POA: Diagnosis not present

## 2024-03-06 DIAGNOSIS — K436 Other and unspecified ventral hernia with obstruction, without gangrene: Secondary | ICD-10-CM | POA: Diagnosis not present

## 2024-03-06 DIAGNOSIS — J45909 Unspecified asthma, uncomplicated: Secondary | ICD-10-CM | POA: Diagnosis not present

## 2024-03-06 DIAGNOSIS — K432 Incisional hernia without obstruction or gangrene: Secondary | ICD-10-CM | POA: Diagnosis not present

## 2024-03-06 DIAGNOSIS — Z7984 Long term (current) use of oral hypoglycemic drugs: Secondary | ICD-10-CM | POA: Diagnosis not present

## 2024-03-06 DIAGNOSIS — Z794 Long term (current) use of insulin: Secondary | ICD-10-CM | POA: Diagnosis not present

## 2024-03-06 DIAGNOSIS — R079 Chest pain, unspecified: Secondary | ICD-10-CM | POA: Diagnosis not present

## 2024-03-06 DIAGNOSIS — I10 Essential (primary) hypertension: Secondary | ICD-10-CM | POA: Diagnosis not present

## 2024-03-06 DIAGNOSIS — Z79899 Other long term (current) drug therapy: Secondary | ICD-10-CM | POA: Diagnosis not present

## 2024-03-06 DIAGNOSIS — E119 Type 2 diabetes mellitus without complications: Secondary | ICD-10-CM | POA: Diagnosis not present

## 2024-03-06 DIAGNOSIS — K219 Gastro-esophageal reflux disease without esophagitis: Secondary | ICD-10-CM | POA: Diagnosis not present

## 2024-03-06 DIAGNOSIS — N181 Chronic kidney disease, stage 1: Secondary | ICD-10-CM | POA: Diagnosis not present

## 2024-03-06 DIAGNOSIS — E871 Hypo-osmolality and hyponatremia: Secondary | ICD-10-CM | POA: Diagnosis not present

## 2024-03-06 LAB — CBC WITH DIFFERENTIAL/PLATELET
Abs Immature Granulocytes: 0.06 10*3/uL (ref 0.00–0.07)
Basophils Absolute: 0 10*3/uL (ref 0.0–0.1)
Basophils Relative: 0 %
Eosinophils Absolute: 0.4 10*3/uL (ref 0.0–0.5)
Eosinophils Relative: 3 %
HCT: 42.3 % (ref 36.0–46.0)
Hemoglobin: 13.2 g/dL (ref 12.0–15.0)
Immature Granulocytes: 0 %
Lymphocytes Relative: 18 %
Lymphs Abs: 2.6 10*3/uL (ref 0.7–4.0)
MCH: 30.1 pg (ref 26.0–34.0)
MCHC: 31.2 g/dL (ref 30.0–36.0)
MCV: 96.4 fL (ref 80.0–100.0)
Monocytes Absolute: 0.9 10*3/uL (ref 0.1–1.0)
Monocytes Relative: 6 %
Neutro Abs: 10.5 10*3/uL — ABNORMAL HIGH (ref 1.7–7.7)
Neutrophils Relative %: 73 %
Platelets: 214 10*3/uL (ref 150–400)
RBC: 4.39 MIL/uL (ref 3.87–5.11)
RDW: 13.6 % (ref 11.5–15.5)
WBC: 14.4 10*3/uL — ABNORMAL HIGH (ref 4.0–10.5)
nRBC: 0 % (ref 0.0–0.2)

## 2024-03-06 LAB — GLUCOSE, CAPILLARY
Glucose-Capillary: 118 mg/dL — ABNORMAL HIGH (ref 70–99)
Glucose-Capillary: 124 mg/dL — ABNORMAL HIGH (ref 70–99)
Glucose-Capillary: 86 mg/dL (ref 70–99)
Glucose-Capillary: 99 mg/dL (ref 70–99)

## 2024-03-06 MED ORDER — PHENOL 1.4 % MT LIQD: 1.0000 | OROMUCOSAL | Status: DC | PRN

## 2024-03-06 MED ORDER — ONDANSETRON 4 MG PO TBDP: 4.0000 mg | ORAL_TABLET | Freq: Four times a day (QID) | ORAL | 0 refills | Status: DC | PRN

## 2024-03-06 MED ORDER — OXYCODONE HCL 5 MG PO TABS: 5.0000 mg | ORAL_TABLET | ORAL | 0 refills | Status: DC | PRN

## 2024-03-06 NOTE — Discharge Summary (Signed)
 Physician Discharge Summary  Patient ID: [name] MRN: [id number] DOB/AGE: June 17, 1951 73 y.o.  Admit date: 03/03/2024 Discharge date: 03/06/2024  Admission Diagnoses: Incarcerated Ventral /Incisional hernia   Discharge Diagnoses:  Principal Problem:   Incisional hernia, without obstruction or gangrene Active Problems:   Diabetes (HCC)   HLD (hyperlipidemia)   Essential hypertension   Other emphysema (HCC)   Asthma   GERD (gastroesophageal reflux disease)   Non-cardiac chest pain   Ventral hernia without obstruction or gangrene   Chest pain in adult   Incarcerated ventral hernia   Discharged Condition: good  Hospital Course: [name] is a 73 yo who came into the hospital for a robotic ventral hernia repair with mesh. She was kept post operatively to monitor for pain control and to ensure she was able to tolerate a diet and had Bms. She did have low grade fevers related to atelectasis but had no signs of any infection. She had a mild leukocytosis but this was from post operative inflammation as there was no shift in the neutrophils and no signs of infection. Prior to discharge she was eating, having Bms and ambulating. Immediately post op she did have Chest pain, and this was worked up and was negative and consistent with her atypical chest pain. This resolved and she had no further complaints. She did require some O2 and this was weaned off. O2 walk test showed a O2 Sat of >92% and she did not requiring O2 at home.   Consults: Hospitalist  Significant Diagnostic Studies:   Latest Reference Range & Units 03/06/24 04:14  WBC 4.0 - 10.5 K/uL 14.4 (H)  RBC 3.87 - 5.11 MIL/uL 4.39  Hemoglobin 12.0 - 15.0 g/dL 65.7  HCT 84.6 - 96.2 % 42.3  MCV 80.0 - 100.0 fL 96.4  MCH 26.0 - 34.0 pg 30.1  MCHC 30.0 - 36.0 g/dL 95.2  RDW 84.1 - 32.4 % 13.6  Platelets 150 - 400 K/uL 214  nRBC 0.0 - 0.2 % 0.0  Neutrophils % 73  Lymphocytes % 18  Monocytes Relative % 6  Eosinophil  % 3  Basophil % 0  Immature Granulocytes % 0  NEUT# 1.7 - 7.7 K/uL 10.5 (H)  Lymphs Abs 0.7 - 4.0 K/uL 2.6  Monocyte # 0.1 - 1.0 K/uL 0.9  Eosinophils Absolute 0.0 - 0.5 K/uL 0.4  Basophils Absolute 0.0 - 0.1 K/uL 0.0  Abs Immature Granulocytes 0.00 - 0.07 K/uL 0.06  (H): Data is abnormally high   Treatments: IV hydration and 03/03/24 Robotic assisted ventral hernia with mesh   Discharge Exam: Blood pressure (!) 109/48, pulse 84, temperature 100 F (37.8 C), temperature source Oral, resp. rate 17, height 5\' 5"  (1.651 m), weight 79.4 kg, SpO2 92%. General appearance: alert and no distress Resp: normal work of breathing GI: soft, nondistended, appropriately tender, right of midline induration, binder in place   Disposition: Discharge disposition: 01-Home or Self Care       Discharge Instructions     Call MD for:  difficulty breathing, headache or visual disturbances   Complete by: As directed    Call MD for:  extreme fatigue   Complete by: As directed    Call MD for:  persistant dizziness or light-headedness   Complete by: As directed    Call MD for:  persistant nausea and vomiting   Complete by: As directed    Call MD for:  redness, tenderness, or signs of infection (pain, swelling, redness, odor or green/yellow discharge around incision  site)   Complete by: As directed    Call MD for:  severe uncontrolled pain   Complete by: As directed    Call MD for:  temperature >100.4   Complete by: As directed    Increase activity slowly   Complete by: As directed       Allergies as of 03/06/2024   No Known Allergies      Medication List     TAKE these medications    acetaminophen  500 MG tablet Commonly known as: TYLENOL  Take 2 tablets (1,000 mg total) by mouth every 8 (eight) hours as needed for mild pain or moderate pain.   amLODipine  10 MG tablet Commonly known as: NORVASC  Take 10 mg by mouth daily.   AquaLance Lancets 30G Misc   aspirin  EC 81 MG tablet Take  81 mg by mouth daily. Swallow whole.   atorvastatin  40 MG tablet Commonly known as: LIPITOR Take 40 mg by mouth daily.   diclofenac  50 MG EC tablet Commonly known as: VOLTAREN  TAKE 1 TABLET BY MOUTH TWICE DAILY AS NEEDED. TAKE WITH FOOD AND WATER   dicyclomine  10 MG capsule Commonly known as: BENTYL  Take 1 capsule (10 mg total) by mouth 4 (four) times daily -  before meals and at bedtime. What changed:  when to take this reasons to take this   docusate sodium  100 MG capsule Commonly known as: COLACE Take 100 mg by mouth daily as needed for mild constipation.   ezetimibe  10 MG tablet Commonly known as: ZETIA  Take 1 tablet by mouth once daily   Farxiga 10 MG Tabs tablet Generic drug: dapagliflozin propanediol Take 10 mg by mouth daily.   fluorometholone  0.1 % ophthalmic suspension Commonly known as: FML Place 1 drop into both eyes daily as needed (irritation).   gabapentin  300 MG capsule Commonly known as: NEURONTIN  Take 1 capsule (300 mg total) by mouth 3 (three) times daily.   hydrochlorothiazide  25 MG tablet Commonly known as: HYDRODIURIL  Take 25 mg by mouth daily.   insulin  aspart protamine- aspart (70-30) 100 UNIT/ML injection Commonly known as: NOVOLOG  MIX 70/30 Inject 30 Units into the skin 2 (two) times daily before a meal.   magnesium  hydroxide 400 MG/5ML suspension Commonly known as: MILK OF MAGNESIA Take 30 mLs by mouth daily as needed for mild constipation or moderate constipation.   meloxicam  15 MG tablet Commonly known as: MOBIC  Take 15 mg by mouth every morning.   metFORMIN  1000 MG tablet Commonly known as: GLUCOPHAGE  Take 1,000 mg by mouth 2 (two) times daily.   methocarbamol  500 MG tablet Commonly known as: ROBAXIN  Take 1 tablet (500 mg total) by mouth 4 (four) times daily.   metoprolol  tartrate 25 MG tablet Commonly known as: LOPRESSOR  Take 1 tablet by mouth twice daily   nitroGLYCERIN  0.4 MG SL tablet Commonly known as:  NITROSTAT  Place 1 tablet (0.4 mg total) under the tongue every 5 (five) minutes as needed for chest pain. Dissolve one tablet under the tongue every 5 mintues as needed for chest pain.   ondansetron  4 MG disintegrating tablet Commonly known as: ZOFRAN -ODT Take 1 tablet (4 mg total) by mouth every 6 (six) hours as needed for nausea.   oxyCODONE  5 MG immediate release tablet Commonly known as: Oxy IR/ROXICODONE  Take 1 tablet (5 mg total) by mouth every 4 (four) hours as needed for severe pain (pain score 7-10) or breakthrough pain.   pantoprazole  40 MG tablet Commonly known as: PROTONIX  Take 1 tablet (40 mg total) by  mouth 2 (two) times daily before a meal.   phenol 1.4 % Liqd Commonly known as: CHLORASEPTIC Use as directed 1 spray in the mouth or throat as needed for throat irritation / pain.   polyethylene glycol 17 g packet Commonly known as: MIRALAX  / GLYCOLAX  Take 17 g by mouth daily.   Rybelsus 14 MG Tabs Generic drug: Semaglutide Take 14 mg by mouth daily.   simethicone  80 MG chewable tablet Commonly known as: MYLICON Chew 0.5 tablets (40 mg total) by mouth every 6 (six) hours as needed for flatulence (bloating).   Sure Comfort Insulin  Syringe 31G X 5/16" 1 ML Misc Generic drug: Insulin  Syringe-Needle U-100        Follow-up Information     Awilda Bogus, MD Follow up on 04/07/2024.   Specialty: General Surgery Why: post op check Contact information: [address] [place] [facility] [address] [phone]                Future Appointments  Date Time Provider Department Center  03/19/2024  9:15 AM Diedra Fowler, MD [facility] None  04/07/2024 11:15 AM Awilda Bogus, MD [facility] None  08/13/2024  9:30 AM Diedra Fowler, MD [facility] None    Signed: Awilda Bogus 03/06/2024, 11:41 AM

## 2024-03-06 NOTE — Discharge Instructions (Signed)
 Discharge Robotic Assisted Laparoscopic Surgery Instructions:  Keep your blood sugars under control. Take your diabetes medications. You can restart all your medications.   Common Complaints: Right shoulder pain is common after laparoscopic surgery.  This is secondary to the gas used in the surgery being trapped under the diaphragm.  Walk to help your body absorb the gas. This will improve in a few days. Pain at the port sites are common, especially the larger port sites. This will improve with time.  Some nausea is common and poor appetite. The main goal is to stay hydrated the first few days after surgery.   Diet/ Activity: Diet as tolerated. You may not have an appetite, but it is important to stay hydrated.  Drink 64 ounces of water a day. Your appetite will return with time.  Shower per your regular routine daily.  Do not take hot showers. Take warm showers that are less than 10 minutes. Rest and listen to your body, but do not remain in bed all day.  Walk everyday for at least 15-20 minutes. Deep cough and move around every 1-2 hours in the first few days after surgery.  Do not lift > 10 lbs, perform excessive bending, pushing, pulling, squatting for 4-6 weeks after surgery.  Do not place lotions or balms on your incision unless instructed to specifically by Dr. Collene Dawson.   Pain Expectations and Narcotics: -After surgery you will have pain associated with your incisions and this is normal. The pain is muscular and nerve pain, and will get better with time. -You are encouraged and expected to take non narcotic medications like tylenol  and ibuprofen (when able) to treat pain as multiple modalities can aid with pain treatment. -Narcotics are only used when pain is severe or there is breakthrough pain. -You are not expected to have a pain score of 0 after surgery, as we cannot prevent pain. A pain score of 3-4 that allows you to be functional, move, walk, and tolerate some activity is the  goal. The pain will continue to improve over the days after surgery and is dependent on your surgery. -Due to [place] law, we are only able to give a certain amount of pain medication to treat post operative pain, and we only give additional narcotics on a patient by patient basis.  -For most laparoscopic surgery, studies have shown that the majority of patients only need 10-15 narcotic pills, and for open surgeries most patients only need 15-20.   -Having appropriate expectations of pain and knowledge of pain management with non narcotics is important as we do not want anyone to become addicted to narcotic pain medication.  -Using ice packs in the first 48 hours and heating pads after 48 hours, wearing an abdominal binder (when recommended), and using over the counter medications are all ways to help with pain management.   -Simple acts like meditation and mindfulness practices after surgery can also help with pain control and research has proven the benefit of these practices.  Medication: Take tylenol  as needed for pain control, alternating every 4-6 hours.  Example:  Tylenol  1000mg  @ 6am, 12noon, 6pm, (Do not exceed 4000mg  of tylenol  a day).  Take Roxicodone  for breakthrough pain every 4 hours.  Take your home medication for constipation.  Drink plenty of water to also prevent constipation.   Contact Information: If you have questions or concerns, please call our office, [phone], Monday- Thursday 8AM-5PM and Friday 8AM-12Noon.  If it is after hours or on the weekend,  please call [facility] Main Number, [phone], [phone], and ask to speak to the surgeon on call for Dr. Collene Dawson at [facility].

## 2024-03-06 NOTE — Progress Notes (Signed)
 Mobility Specialist Progress Note:    03/06/24 0850  Mobility  Activity Ambulated with assistance in hallway  Level of Assistance Standby assist, set-up cues, supervision of patient - no hands on  Assistive Device Front wheel walker  Distance Ambulated (ft) 150 ft  Range of Motion/Exercises Active;All extremities  Activity Response Tolerated well  Mobility Referral Yes  Mobility visit 1 Mobility  Mobility Specialist Start Time (ACUTE ONLY) 0850  Mobility Specialist Stop Time (ACUTE ONLY) 0910  Mobility Specialist Time Calculation (min) (ACUTE ONLY) 20 min   Pt received in bed, agreeable to mobility. Required SBA to stand and ambulate with RW. Tolerated well, SpO2 88-92% on RA EOS. Left pt sitting EOB, all needs met.  Daryan Buell Mobility Specialist Please contact via Special educational needs teacher or  Rehab office at [phone]

## 2024-03-06 NOTE — Progress Notes (Addendum)
 Pt A&O x4 c/o little tenderness to upper mid abd. And RLQ. One BM reported this morning passing flatus, bowl sounds in all 4 quadrants, notified Dr. Collene Dawson

## 2024-03-06 NOTE — Plan of Care (Signed)
   Problem: Education: Goal: Knowledge of General Education information will improve Description Including pain rating scale, medication(s)/side effects and non-pharmacologic comfort measures Outcome: Progressing   Problem: Health Behavior/Discharge Planning: Goal: Ability to manage health-related needs will improve Outcome: Progressing

## 2024-03-06 NOTE — Progress Notes (Signed)
 3 Days Post-Op  Subjective: She reports feeling better with regard to pain. She was eating solid food when visited. With help, she had gotten up to move around twice. She has continued to use the incentive spirometer as previously directed. She has had no bowel movements in the past 24 hours, but is able to pass flatus. She also states that she has not urinated. She is still reporting that she has a sore throat and a slight fever. She now endorses a mildly productive cough. She stated that she felt well enough for discharge.  Objective: Vital signs in last 24 hours: Temp:  [98.5 F (36.9 C)-100 F (37.8 C)] 100 F (37.8 C) (05/23 0356) Pulse Rate:  [67-84] 84 (05/23 0814) Resp:  [17] 17 (05/23 0356) BP: (97-117)/(47-54) 109/48 (05/23 0814) SpO2:  [92 %-98 %] 98 % (05/23 0356) Last BM Date : 03/05/24  Intake/Output from previous day: 05/22 0701 - 05/23 0700 In: 1080 [P.O.:1080] Out: -  Intake/Output this shift: No intake/output data recorded.  General appearance: alert, cooperative, and no distress Resp: clear to auscultation bilaterally Cardio: regular rate and rhythm, S1, S2 normal, no murmur, click, rub or gallop Incision/Wound: Incisions tender to palpation, clean, non-erythematous or warm.  Lab Results:  Recent Labs    03/05/24 0415 03/06/24 0414  WBC 13.7* 14.4*  HGB 13.2 13.2  HCT 42.4 42.3  PLT 204 214   BMET Recent Labs    03/04/24 0409 03/05/24 0415  NA 136 134*  K 3.9 3.6  CL 98 96*  CO2 26 31  GLUCOSE 142* 134*  BUN 14 15  CREATININE 0.89 0.80  CALCIUM  8.5* 8.6*   PT/INR No results for input(s): "LABPROT", "INR" in the last 72 hours.  Studies/Results: ECHOCARDIOGRAM COMPLETE Result Date: 03/04/2024    ECHOCARDIOGRAM REPORT   Patient Name:   [name] Date of Exam: 03/04/2024 Medical Rec #:  [id number]             Height:       65.0 in Accession #:    [id number]            Weight:       175.0 lb Date of Birth:  July 31, 1951             BSA:           1.869 m Patient Age:    73 years              BP:           107/52 mmHg Patient Gender: F                     HR:           65 bpm. Exam Location:  [facility] Procedure: 2D Echo, Cardiac Doppler and Color Doppler (Both Spectral and Color            Flow Doppler were utilized during procedure). Indications:     Chest Pain R07.9  History:         Patient has prior history of Echocardiogram examinations, most                  recent 08/15/2018. Risk Factors:Hypertension, Diabetes and                  Dyslipidemia.  Sonographer:     Denese Finn RCS Referring Phys:  [id number] COURAGE EMOKPAE Diagnosing Phys: Armida Lander MD IMPRESSIONS  1. Left ventricular ejection  fraction, by estimation, is 60 to 65%. The left ventricle has normal function. The left ventricle has no regional wall motion abnormalities. Left ventricular diastolic parameters were normal.  2. Right ventricular systolic function is normal. The right ventricular size is normal. There is normal pulmonary artery systolic pressure.  3. The mitral valve is normal in structure. No evidence of mitral valve regurgitation. No evidence of mitral stenosis.  4. The aortic valve is tricuspid. There is mild calcification of the aortic valve. There is mild thickening of the aortic valve. Aortic valve regurgitation is not visualized. No aortic stenosis is present.  5. The inferior vena cava is normal in size with greater than 50% respiratory variability, suggesting right atrial pressure of 3 mmHg. FINDINGS  Left Ventricle: Left ventricular ejection fraction, by estimation, is 60 to 65%. The left ventricle has normal function. The left ventricle has no regional wall motion abnormalities. The left ventricular internal cavity size was normal in size. There is  no left ventricular hypertrophy. Left ventricular diastolic parameters were normal. Right Ventricle: The right ventricular size is normal. Right vetricular wall thickness was not well visualized. Right  ventricular systolic function is normal. There is normal pulmonary artery systolic pressure. The tricuspid regurgitant velocity is 2.64 m/s, and with an assumed right atrial pressure of 3 mmHg, the estimated right ventricular systolic pressure is 30.9 mmHg. Left Atrium: Left atrial size was normal in size. Right Atrium: Right atrial size was normal in size. Pericardium: There is no evidence of pericardial effusion. Mitral Valve: The mitral valve is normal in structure. There is mild thickening of the mitral valve leaflet(s). There is mild calcification of the mitral valve leaflet(s). Mild mitral annular calcification. No evidence of mitral valve regurgitation. No evidence of mitral valve stenosis. Tricuspid Valve: The tricuspid valve is normal in structure. Tricuspid valve regurgitation is mild . No evidence of tricuspid stenosis. Aortic Valve: The aortic valve is tricuspid. There is mild calcification of the aortic valve. There is mild thickening of the aortic valve. There is mild aortic valve annular calcification. Aortic valve regurgitation is not visualized. No aortic stenosis  is present. Aortic valve mean gradient measures 4.8 mmHg. Aortic valve peak gradient measures 9.0 mmHg. Aortic valve area, by VTI measures 2.22 cm. Pulmonic Valve: The pulmonic valve was not well visualized. Pulmonic valve regurgitation is not visualized. No evidence of pulmonic stenosis. Aorta: The aortic root is normal in size and structure. Venous: The inferior vena cava is normal in size with greater than 50% respiratory variability, suggesting right atrial pressure of 3 mmHg. IAS/Shunts: The interatrial septum was not well visualized.  LEFT VENTRICLE PLAX 2D LVIDd:         4.60 cm   Diastology LVIDs:         3.00 cm   LV e' medial:    8.05 cm/s LV PW:         0.90 cm   LV E/e' medial:  12.4 LV IVS:        1.10 cm   LV e' lateral:   10.10 cm/s LVOT diam:     2.00 cm   LV E/e' lateral: 9.9 LV SV:         76 LV SV Index:   41 LVOT  Area:     3.14 cm  RIGHT VENTRICLE RV S prime:     9.57 cm/s TAPSE (M-mode): 1.8 cm LEFT ATRIUM             Index  RIGHT ATRIUM           Index LA diam:        3.50 cm 1.87 cm/m   RA Area:     14.80 cm LA Vol (A2C):   54.1 ml 28.95 ml/m  RA Volume:   38.00 ml  20.33 ml/m LA Vol (A4C):   46.7 ml 24.99 ml/m LA Biplane Vol: 50.9 ml 27.24 ml/m  AORTIC VALVE AV Area (Vmax):    2.11 cm AV Area (Vmean):   2.17 cm AV Area (VTI):     2.22 cm AV Vmax:           150.40 cm/s AV Vmean:          104.948 cm/s AV VTI:            0.340 m AV Peak Grad:      9.0 mmHg AV Mean Grad:      4.8 mmHg LVOT Vmax:         101.00 cm/s LVOT Vmean:        72.400 cm/s LVOT VTI:          0.241 m LVOT/AV VTI ratio: 0.71  AORTA Ao Root diam: 3.40 cm Ao Asc diam:  3.20 cm MITRAL VALVE               TRICUSPID VALVE MV Area (PHT): 3.55 cm    TR Peak grad:   27.9 mmHg MV Decel Time: 214 msec    TR Vmax:        264.00 cm/s MV E velocity: 99.70 cm/s MV A velocity: 81.85 cm/s  SHUNTS MV E/A ratio:  1.22        Systemic VTI:  0.24 m                            Systemic Diam: 2.00 cm Armida Lander MD Electronically signed by Armida Lander MD Signature Date/Time: 03/04/2024/1:14:08 PM    Final (Updated)     Anti-infectives: Anti-infectives (From admission, onward)    Start     Dose/Rate Route Frequency Ordered Stop   03/03/24 1600  ceFAZolin  (ANCEF ) IVPB 2g/100 mL premix        2 g 200 mL/hr over 30 Minutes Intravenous Every 8 hours 03/03/24 1245 03/03/24 1703   03/03/24 0615  ceFAZolin  (ANCEF ) IVPB 2g/100 mL premix        2 g 200 mL/hr over 30 Minutes Intravenous On call to O.R. 03/03/24 1610 03/03/24 0815       Assessment/Plan: s/p Procedure(s): REPAIR, HERNIA, VENTRAL, ROBOT-ASSISTED EXTENSIVE LYSIS, ADHESIONS, LAPAROSCOPIC Discharge  LOS: 0 days    [name] 03/06/2024

## 2024-03-06 NOTE — Progress Notes (Addendum)
 3 Days Post-Op  Subjective:  The patient reports feeling generally well and notes an improvement in her pain levels. She is tolerating solid foods without experiencing nausea or vomiting. Although she has not yet had a bowel movement or urinated; however, she is able to pass flatus. Incisions are healing well, intact, and dry with no erythema, necrosis, swelling, warmth, tenderness, or drainage. The patient is currently receiving oxygen  at 1.5 L/min via nasal cannula. She was able to ambulate yesterday without oxygen  and did not experience dyspnea. However, during ambulation this morning without oxygen , she did experience dyspnea. Since yesterday, the patient has developed a mild fever and sore throat. Her incentive spirometry efforts are approximately 1000 mL. During the examination this morning, she had one episode of a wet-sounding cough and reported that her cough is sometimes productive of thick phlegm, though without any blood streaks.  Objective: Vital signs in last 24 hours: Temp:  [98.5 F (36.9 C)-100 F (37.8 C)] 100 F (37.8 C) (05/23 0356) Pulse Rate:  [67-84] 84 (05/23 0814) Resp:  [17] 17 (05/23 0356) BP: (97-117)/(47-54) 109/48 (05/23 0814) SpO2:  [92 %-98 %] 92 % (05/23 0832) Last BM Date : 03/05/24  Intake/Output from previous day: 05/22 0701 - 05/23 0700 In: 1080 [P.O.:1080] Out: -  Intake/Output this shift: Total I/O In: 240 [P.O.:240] Out: -   Physical Exam Constitutional:      General: She is not in acute distress.    Appearance: Normal appearance. She is not ill-appearing, toxic-appearing or diaphoretic.  Cardiovascular:     Rate and Rhythm: Normal rate and regular rhythm.     Heart sounds: No murmur heard.    No friction rub. No gallop.  Pulmonary:     Effort: Pulmonary effort is normal. No respiratory distress.     Breath sounds: Normal breath sounds. No wheezing or rales.  Abdominal:     General: Abdomen is flat. Bowel sounds are normal. There is no  distension.     Palpations: Abdomen is soft.     Tenderness: There is generalized abdominal tenderness. There is no guarding or rebound.     Comments: Tenderness mild at baseline but markedly increase upon palpation   Skin:    General: Skin is warm and dry.     Coloration: Skin is not jaundiced or pale.  Neurological:     Mental Status: She is alert.  Psychiatric:        Mood and Affect: Mood normal.        Behavior: Behavior normal.        Thought Content: Thought content normal.      Lab Results:  Recent Labs    03/05/24 0415 03/06/24 0414  WBC 13.7* 14.4*  HGB 13.2 13.2  HCT 42.4 42.3  PLT 204 214   BMET Recent Labs    03/04/24 0409 03/05/24 0415  NA 136 134*  K 3.9 3.6  CL 98 96*  CO2 26 31  GLUCOSE 142* 134*  BUN 14 15  CREATININE 0.89 0.80  CALCIUM  8.5* 8.6*   PT/INR No results for input(s): "LABPROT", "INR" in the last 72 hours.  Studies/Results: ECHOCARDIOGRAM COMPLETE Result Date: 03/04/2024    ECHOCARDIOGRAM REPORT   Patient Name:   [name] Date of Exam: 03/04/2024 Medical Rec #:  [id number]             Height:       65.0 in Accession #:    [id number]  Weight:       175.0 lb Date of Birth:  1951/09/14             BSA:          1.869 m Patient Age:    73 years              BP:           107/52 mmHg Patient Gender: F                     HR:           65 bpm. Exam Location:  [facility] Procedure: 2D Echo, Cardiac Doppler and Color Doppler (Both Spectral and Color            Flow Doppler were utilized during procedure). Indications:     Chest Pain R07.9  History:         Patient has prior history of Echocardiogram examinations, most                  recent 08/15/2018. Risk Factors:Hypertension, Diabetes and                  Dyslipidemia.  Sonographer:     Denese Finn RCS Referring Phys:  [id number] COURAGE EMOKPAE Diagnosing Phys: Armida Lander MD IMPRESSIONS  1. Left ventricular ejection fraction, by estimation, is 60 to 65%. The left ventricle  has normal function. The left ventricle has no regional wall motion abnormalities. Left ventricular diastolic parameters were normal.  2. Right ventricular systolic function is normal. The right ventricular size is normal. There is normal pulmonary artery systolic pressure.  3. The mitral valve is normal in structure. No evidence of mitral valve regurgitation. No evidence of mitral stenosis.  4. The aortic valve is tricuspid. There is mild calcification of the aortic valve. There is mild thickening of the aortic valve. Aortic valve regurgitation is not visualized. No aortic stenosis is present.  5. The inferior vena cava is normal in size with greater than 50% respiratory variability, suggesting right atrial pressure of 3 mmHg. FINDINGS  Left Ventricle: Left ventricular ejection fraction, by estimation, is 60 to 65%. The left ventricle has normal function. The left ventricle has no regional wall motion abnormalities. The left ventricular internal cavity size was normal in size. There is  no left ventricular hypertrophy. Left ventricular diastolic parameters were normal. Right Ventricle: The right ventricular size is normal. Right vetricular wall thickness was not well visualized. Right ventricular systolic function is normal. There is normal pulmonary artery systolic pressure. The tricuspid regurgitant velocity is 2.64 m/s, and with an assumed right atrial pressure of 3 mmHg, the estimated right ventricular systolic pressure is 30.9 mmHg. Left Atrium: Left atrial size was normal in size. Right Atrium: Right atrial size was normal in size. Pericardium: There is no evidence of pericardial effusion. Mitral Valve: The mitral valve is normal in structure. There is mild thickening of the mitral valve leaflet(s). There is mild calcification of the mitral valve leaflet(s). Mild mitral annular calcification. No evidence of mitral valve regurgitation. No evidence of mitral valve stenosis. Tricuspid Valve: The tricuspid valve  is normal in structure. Tricuspid valve regurgitation is mild . No evidence of tricuspid stenosis. Aortic Valve: The aortic valve is tricuspid. There is mild calcification of the aortic valve. There is mild thickening of the aortic valve. There is mild aortic valve annular calcification. Aortic valve regurgitation is not visualized. No aortic stenosis  is present.  Aortic valve mean gradient measures 4.8 mmHg. Aortic valve peak gradient measures 9.0 mmHg. Aortic valve area, by VTI measures 2.22 cm. Pulmonic Valve: The pulmonic valve was not well visualized. Pulmonic valve regurgitation is not visualized. No evidence of pulmonic stenosis. Aorta: The aortic root is normal in size and structure. Venous: The inferior vena cava is normal in size with greater than 50% respiratory variability, suggesting right atrial pressure of 3 mmHg. IAS/Shunts: The interatrial septum was not well visualized.  LEFT VENTRICLE PLAX 2D LVIDd:         4.60 cm   Diastology LVIDs:         3.00 cm   LV e' medial:    8.05 cm/s LV PW:         0.90 cm   LV E/e' medial:  12.4 LV IVS:        1.10 cm   LV e' lateral:   10.10 cm/s LVOT diam:     2.00 cm   LV E/e' lateral: 9.9 LV SV:         76 LV SV Index:   41 LVOT Area:     3.14 cm  RIGHT VENTRICLE RV S prime:     9.57 cm/s TAPSE (M-mode): 1.8 cm LEFT ATRIUM             Index        RIGHT ATRIUM           Index LA diam:        3.50 cm 1.87 cm/m   RA Area:     14.80 cm LA Vol (A2C):   54.1 ml 28.95 ml/m  RA Volume:   38.00 ml  20.33 ml/m LA Vol (A4C):   46.7 ml 24.99 ml/m LA Biplane Vol: 50.9 ml 27.24 ml/m  AORTIC VALVE AV Area (Vmax):    2.11 cm AV Area (Vmean):   2.17 cm AV Area (VTI):     2.22 cm AV Vmax:           150.40 cm/s AV Vmean:          104.948 cm/s AV VTI:            0.340 m AV Peak Grad:      9.0 mmHg AV Mean Grad:      4.8 mmHg LVOT Vmax:         101.00 cm/s LVOT Vmean:        72.400 cm/s LVOT VTI:          0.241 m LVOT/AV VTI ratio: 0.71  AORTA Ao Root diam: 3.40 cm Ao Asc  diam:  3.20 cm MITRAL VALVE               TRICUSPID VALVE MV Area (PHT): 3.55 cm    TR Peak grad:   27.9 mmHg MV Decel Time: 214 msec    TR Vmax:        264.00 cm/s MV E velocity: 99.70 cm/s MV A velocity: 81.85 cm/s  SHUNTS MV E/A ratio:  1.22        Systemic VTI:  0.24 m                            Systemic Diam: 2.00 cm Armida Lander MD Electronically signed by Armida Lander MD Signature Date/Time: 03/04/2024/1:14:08 PM    Final (Updated)     Anti-infectives: Anti-infectives (From admission, onward)    Start     Dose/Rate  Route Frequency Ordered Stop   03/03/24 1600  ceFAZolin  (ANCEF ) IVPB 2g/100 mL premix        2 g 200 mL/hr over 30 Minutes Intravenous Every 8 hours 03/03/24 1245 03/03/24 1703   03/03/24 0615  ceFAZolin  (ANCEF ) IVPB 2g/100 mL premix        2 g 200 mL/hr over 30 Minutes Intravenous On call to O.R. 03/03/24 1610 03/03/24 0815       Assessment/Plan: s/p Procedure(s): REPAIR, HERNIA, VENTRAL, ROBOT-ASSISTED EXTENSIVE LYSIS, ADHESIONS, LAPAROSCOPIC  The patient is recovering well from the procedure; however, the persistent fever and elevated white blood cell count raise suspicion of an underlying infection. Given that the patient is on post-operative day 3, consider ordering a chest X-ray to evaluate for pneumonia and a urinalysis to assess for a urinary tract infection.  Brenton Joines 03/06/2024

## 2024-03-06 NOTE — Progress Notes (Signed)
 SATURATION QUALIFICATIONS:  Patient Saturations on Room Air at Rest = 91%  Patient Saturations on Room Air while Ambulating = 92%  No need for oxygen  at this time. Notified Dr. Collene Dawson.

## 2024-03-11 NOTE — Progress Notes (Signed)
   03/11/2024  Patient ID: [name], female   DOB: 03/18/1951, 73 y.o.   MRN: [id number]  Called patient to inform of PAP approval, and that her medications will be delivered to the providers office. She reports she will have someone pick them up next week.   I will follow up to ensure patient has received medications.   Livia Riffle, PharmD Clinical Pharmacist  [phone]

## 2024-03-19 ENCOUNTER — Ambulatory Visit: Payer: Medicare Other | Admitting: Orthopedic Surgery

## 2024-03-20 NOTE — Progress Notes (Signed)
   03/20/2024  Patient ID: [name], female   DOB: Jan 07, 1951, 73 y.o.   MRN: [id number]  Spoke to patient, she verified that she's received the Rybelsus and Novolin 70/30 from her PCP's office.   I will follow up on her diabetes management in 4 weeks, 04/16/24.  Livia Riffle, PharmD Clinical Pharmacist  [phone]

## 2024-03-25 ENCOUNTER — Other Ambulatory Visit: Payer: Self-pay | Admitting: Orthopedic Surgery

## 2024-03-30 ENCOUNTER — Telehealth: Payer: Self-pay | Admitting: *Deleted

## 2024-03-30 NOTE — Telephone Encounter (Signed)
 Surgical Date: 03/03/2024 Procedure: XI ROBOTIC ASSISTED VENTRAL HERNIA REPAIR W/ MESH  Received call from patient [phone]~ telephone.   Patient requested earlier appointment to have provider evaluate incisions. Reports that sister Candice Chalet is assisting to clean areas and has concerns. Candice Chalet reports that glue has worn off one incision completely and is barely hanging on the other incisions. Advised that the glue is meant to wear off in 1- 2 weeks. Advised that patient has internal sutures that will keep incisions closed. Candice Chalet also reports that patient has slight reddish brown drainage from the uncovered incision. Advised that this is also normal and is most likely a small hematoma leaking. Advised that the fluid will eventually reabsorb or it can be drained at OV if needed. Advised to continue to monitor and report any S/Sx of infection.   Verbalized understanding.

## 2024-04-07 ENCOUNTER — Encounter: Payer: Self-pay | Admitting: General Surgery

## 2024-04-07 ENCOUNTER — Ambulatory Visit (INDEPENDENT_AMBULATORY_CARE_PROVIDER_SITE_OTHER): Admitting: General Surgery

## 2024-04-07 VITALS — BP 110/69 | HR 61 | Temp 97.8°F | Resp 16 | Ht 65.0 in | Wt 170.0 lb

## 2024-04-07 DIAGNOSIS — K432 Incisional hernia without obstruction or gangrene: Secondary | ICD-10-CM

## 2024-04-07 NOTE — Patient Instructions (Addendum)
 No heavy lifting > 10 lbs, excessive bending, pushing, pulling, or squatting for 8 weeks after surgery (May 01, 2024). Call with issues. Monitor the area on the right abdomen for any signs of worsening pain.

## 2024-04-08 NOTE — Progress Notes (Signed)
 [facility]  Patient doing well overall. Notices the area in the right abdomen where the indurated part post op had been noted. She denies any pain here.   BP 110/69   Pulse 61   Temp 97.8 F (36.6 C) (Oral)   Resp 16   Ht 5' 5 (1.651 m)   Wt 170 lb (77.1 kg)   SpO2 91%   BMI 28.29 kg/m  Soft, nondistended, port sites healed Nontender Right of midline right indurated area less indurated, I think this is is the old mesh that had folded up from prior repair and had been present since her surgery and improving  Patient s/p robotic assisted ventral hernia repair with mesh. Doing well.  No heavy lifting > 10 lbs, excessive bending, pushing, pulling, or squatting for 8 weeks after surgery (May 01, 2024). Call with issues. Monitor the area on the right abdomen for any signs of worsening pain.  Will continue to watch the area. No need for any imaging at this time. I do not think this is a recurrence, but is the residual old mesh that can be felt due to her very thin abdominal wall.   Manuelita Pander, MD [facility] [address] [address] [place], [place] [address] [phone] (office)

## 2024-04-09 ENCOUNTER — Ambulatory Visit: Admitting: Orthopedic Surgery

## 2024-04-09 ENCOUNTER — Other Ambulatory Visit (INDEPENDENT_AMBULATORY_CARE_PROVIDER_SITE_OTHER): Payer: Self-pay

## 2024-04-09 DIAGNOSIS — Z981 Arthrodesis status: Secondary | ICD-10-CM

## 2024-04-09 NOTE — Progress Notes (Signed)
 Orthopedic Spine Surgery Office Note   Assessment: Patient is a 73 y.o. female s/p T10-S1 fusion with Dr. Lucilla who is doing well and is not having any issues     Plan: -Activity as tolerated, no spine specific precautions -Since patient is doing well, no intervention recommended at this time -Can continue to use tylenol  as needed for pain relief -Patient should return to office on an as-needed basis   Patient expressed understanding of the plan and all questions were answered to the patient's satisfaction.    ___________________________________________________________________________     History:   Patient is a 73 y.o. female who presents today for follow-up on her lumbar spine. Patient had undergone several lumbar surgeries with Dr. Lucilla. Her current construct is from T10-S1. She comes in today for routine follow up. Reporting her back pain as mild and well controlled with over the counter medications. Not having any radiating leg pain. No complaints at this time.    Treatments tried: PT, Meloxicam , Robaxin , gabapentin      Physical Exam:   General: no acute distress, appears stated age Neurologic: alert, answering questions appropriately, following commands Respiratory: unlabored breathing on room air, symmetric chest rise Psychiatric: appropriate affect, normal cadence to speech     MSK (spine):   -Strength exam                                                   Left                  Right EHL                              5/5                  5/5 TA                                 5/5                  5/5 GSC                             5/5                  5/5 Knee extension            5/5                  5/5 Hip flexion                    5/5                  5/5   -Sensory exam                           Sensation intact to light touch in L3-S1 nerve distributions of bilateral lower extremities   Imaging: XRs scoliosis from 04/09/2024 were independently reviewed  and interpreted, showing posterior spinal instrumentation from T10-S1.  There are interbody devices in every form of lumbar disc space.  Interbody's appear in appropriate position.  Grade 1 spondylolisthesis seen at L5/S1.  No lucency  seen around the posterior instrumentation.  None of the screws have pulled out.  No broken rod seen.  No fracture or dislocation seen.     Patient name: [name] Patient MRN: [id number] Date of visit: 04/09/24

## 2024-04-13 DIAGNOSIS — E1169 Type 2 diabetes mellitus with other specified complication: Secondary | ICD-10-CM | POA: Diagnosis not present

## 2024-04-13 DIAGNOSIS — N1832 Chronic kidney disease, stage 3b: Secondary | ICD-10-CM | POA: Diagnosis not present

## 2024-04-13 DIAGNOSIS — I10 Essential (primary) hypertension: Secondary | ICD-10-CM | POA: Diagnosis not present

## 2024-04-13 DIAGNOSIS — E1122 Type 2 diabetes mellitus with diabetic chronic kidney disease: Secondary | ICD-10-CM | POA: Diagnosis not present

## 2024-04-13 DIAGNOSIS — E7800 Pure hypercholesterolemia, unspecified: Secondary | ICD-10-CM | POA: Diagnosis not present

## 2024-04-13 DIAGNOSIS — I129 Hypertensive chronic kidney disease with stage 1 through stage 4 chronic kidney disease, or unspecified chronic kidney disease: Secondary | ICD-10-CM | POA: Diagnosis not present

## 2024-04-16 ENCOUNTER — Telehealth: Payer: Self-pay

## 2024-04-16 NOTE — Progress Notes (Signed)
   04/16/2024  Patient ID: [name], female   DOB: Jan 10, 1951, 73 y.o.   MRN: [id number]  Contacted patient to follow up on diabetes management. Patient saw PCP on 04/13/24. We were having connection issues today. Patient is agreeable to reschedule appointment for 04/22/24.   Heather Factor, PharmD Clinical Pharmacist  [phone]

## 2024-04-20 DIAGNOSIS — E785 Hyperlipidemia, unspecified: Secondary | ICD-10-CM | POA: Diagnosis not present

## 2024-04-20 DIAGNOSIS — N1831 Chronic kidney disease, stage 3a: Secondary | ICD-10-CM | POA: Diagnosis not present

## 2024-04-20 DIAGNOSIS — R8290 Unspecified abnormal findings in urine: Secondary | ICD-10-CM | POA: Diagnosis not present

## 2024-04-20 DIAGNOSIS — E1129 Type 2 diabetes mellitus with other diabetic kidney complication: Secondary | ICD-10-CM | POA: Diagnosis not present

## 2024-04-20 DIAGNOSIS — I10 Essential (primary) hypertension: Secondary | ICD-10-CM | POA: Diagnosis not present

## 2024-04-22 ENCOUNTER — Telehealth: Payer: Self-pay

## 2024-04-22 NOTE — Progress Notes (Signed)
 04/22/2024 Name: [name] MRN: [id number] DOB: 1951/06/20  Chief Complaint  Patient presents with   Diabetes    [name] is a 73 y.o. year old female who presented for a telephone visit.   They were referred to the pharmacist by a quality report for assistance in managing diabetes.    Subjective: Patient engaged for diabetes follow up. Last saw PCP on 04/13/24, A1c is back on a downward trend since patient has all her medications in stock.  Patient reports her sugars are doing well and she's eating better since her surgery, reporting lost taste for soda, fried foods, since hospitalization.  Care Team: Primary Care Provider: Leigh Lung, MD ; Next Scheduled Visit: 07/06/24   Medication Access/Adherence  Current Pharmacy:  [facility] [address] [place] - [address] [place] [address]  [place] [address] Phone: [phone] Fax: [phone]   Patient reports affordability concerns with their medications: No Farxiga recommended by Nephrologist, will apply for PAP if approved by PCP. Patient reports access/transportation concerns to their pharmacy: No  Patient reports adherence concerns with their medications:  No     Diabetes:  Current medications: Metformin  1000 mg twice daily, Rybelsus 14 mg daily, and Novolin 70/30 30 units twice daily Medications tried in the past:   Current glucose readings: FBGs: 100s - 130, 2H PPBGs: 120s - 170s  Using Evencare meter; testing three times daily  Patient denies hypoglycemic s/sx including dizziness, shakiness, sweating. Patient denies hyperglycemic symptoms including polyuria, polydipsia, polyphagia, nocturia, neuropathy, blurred vision.  Current meal patterns: since hernia repair, pt doesn't consume very much meat with meals - Breakfast: boiled eggs, banana, bacon (sparingly) - Lunch: string beans, baked sweet potato - Supper: broiled fish, sweet potato - Snacks: Sugar-Free cookies -  Drinks: 64 oz water  daily  Current physical activity: home exercises daily x 25 minutes  Current medication access support: Farxiga    Objective:  Lab Results  Component Value Date   HGBA1C 12.3 (H) 02/28/2024    Lab Results  Component Value Date   CREATININE 0.80 03/05/2024   BUN 15 03/05/2024   NA 134 (L) 03/05/2024   K 3.6 03/05/2024   CL 96 (L) 03/05/2024   CO2 31 03/05/2024    Lab Results  Component Value Date   CHOL 195 06/12/2022   HDL 31 (L) 06/12/2022   LDLCALC 108 (H) 06/12/2022   TRIG 281 (H) 06/12/2022   CHOLHDL 6.3 06/12/2022    Medications Reviewed Today     Reviewed by Graylon Keen, [facility] (Pharmacist) on 04/22/24 at 1313  Med List Status: <None>   Medication Order Taking? Sig Documenting Provider Last Dose Status Informant  acetaminophen  (TYLENOL ) 500 MG tablet [id number] Yes Take 2 tablets (1,000 mg total) by mouth every 8 (eight) hours as needed for mild pain or moderate pain. Georgina Ozell LABOR, MD  Active Self  amLODipine  (NORVASC ) 10 MG tablet [id number] Yes Take 10 mg by mouth daily. [provider]  Active Self  AquaLance Lancets 30G MISC [id number]   [provider]  Active Self  aspirin  EC 81 MG tablet [id number] Yes Take 81 mg by mouth daily. Swallow whole. [provider]  Active Self  atorvastatin  (LIPITOR) 40 MG tablet [id number] Yes Take 40 mg by mouth daily. [provider]  Active Self  dapagliflozin propanediol (FARXIGA) 10 MG TABS tablet [id number] Yes Take 10 mg by mouth daily. [provider]  Active Self  diclofenac  (VOLTAREN ) 50 MG EC tablet  [id number] Yes TAKE 1 TABLET BY MOUTH TWICE DAILY AS NEEDED WITH  FOOD  AND  WATER  Georgina Ozell LABOR, MD  Active   dicyclomine  (BENTYL ) 10 MG capsule [id number]  Take 1 capsule (10 mg total) by mouth 4 (four) times daily -  before meals and at bedtime.  Patient not taking: Reported on 04/22/2024   Kallie Manuelita BROCKS, MD  Active Self  docusate sodium  (COLACE) 100  MG capsule [id number]  Take 100 mg by mouth daily as needed for mild constipation.  Patient not taking: Reported on 04/22/2024   [provider]  Active Self  ezetimibe  (ZETIA ) 10 MG tablet [id number] Yes Take 1 tablet by mouth once daily Mallipeddi, Vishnu P, MD  Active Self  fluorometholone  (FML) 0.1 % ophthalmic suspension [id number] Yes Place 1 drop into both eyes daily as needed (irritation). [provider]  Active Self  gabapentin  (NEURONTIN ) 300 MG capsule [id number] Yes Take 1 capsule (300 mg total) by mouth 3 (three) times daily. Georgina Ozell LABOR, MD  Active Self  hydrochlorothiazide  (HYDRODIURIL ) 25 MG tablet [id number] Yes Take 25 mg by mouth daily. [provider]  Active Self    Discontinued 04/22/24 1259 (Change in therapy) insulin  NPH-regular Human (70-30) 100 UNIT/ML injection [id number] Yes Inject 30 Units into the skin 2 (two) times daily with a meal. [provider]  Active    Patient not taking:   Discontinued 04/22/24 1301 (Patient Preference)  Discontinued 04/22/24 1306 (Discontinued by provider) metFORMIN  (GLUCOPHAGE ) 1000 MG tablet [id number] Yes Take 1,000 mg by mouth 2 (two) times daily. [provider]  Active Self  methocarbamol  (ROBAXIN ) 500 MG tablet [id number] Yes Take 1 tablet (500 mg total) by mouth 4 (four) times daily. Georgina Ozell LABOR, MD  Active Self  metoprolol  tartrate (LOPRESSOR ) 25 MG tablet [id number] Yes Take 1 tablet by mouth twice daily Mallipeddi, Vishnu P, MD  Active Self  nitroGLYCERIN  (NITROSTAT ) 0.4 MG SL tablet [id number] Yes Place 1 tablet (0.4 mg total) under the tongue every 5 (five) minutes as needed for chest pain. Dissolve one tablet under the tongue every 5 mintues as needed for chest pain. Mallipeddi, Vishnu P, MD  Active Self  pantoprazole  (PROTONIX ) 40 MG tablet [id number] Yes Take 1 tablet (40 mg total) by mouth 2 (two) times daily before a meal. Rudy Josette RAMAN, PA-C  Active Self    Discontinued 04/22/24  1310 (Patient has not taken in last 30 days)   polyethylene glycol (MIRALAX  / GLYCOLAX ) 17 g packet [id number]  Take 17 g by mouth daily.  Patient not taking: Reported on 04/22/2024   Pearlean Manus, MD  Active Self  RYBELSUS 14 MG TABS [id number] Yes Take 14 mg by mouth daily. [provider]  Active Self           Med Note WORLEY, STEPHANE RAMAN Kitchens Aug 27, 2022  5:30 PM) Pt gets at [facility] office  simethicone  (MYLICON) 80 MG chewable tablet [id number] Yes Chew 0.5 tablets (40 mg total) by mouth every 6 (six) hours as needed for flatulence (bloating). Kallie Manuelita BROCKS, MD  Active Self  SURE COMFORT INSULIN  SYRINGE 31G X 5/16 1 ML MISC [id number] Yes  [provider]  Active Self              Assessment/Plan:   Diabetes: - Currently uncontrolled, but came down 9.3%, 3 points from last A1c now that patient has resumed her medication regimen - Reviewed long term cardiovascular and renal outcomes of uncontrolled  blood sugar - Reviewed goal A1c, goal fasting, and goal 2 hour post prandial glucose - Reviewed dietary modifications including increasing vegetable intake, continuing to limit carb intake and make healthy substitutions as she's been doing - Recommend to initiate Farxiga 5mg  per nephrology recommendation, in addition to metformin  and Rybelsus.Would recommend reducing Novolin 70/30 dose to reduce risk of hypoglycemia. Will collaborate with PCP on medication adjustments.   - Patient also due for annual UACR, will request order from PCP prior to next appointment - Recommend to check glucose two - three times daily. Patient declines a CGM at this time, patient still needs a new meter though. Will collaborate with PCP to get prescription sent in for a new Accuchek meter.  - Meets financial criteria for Farxiga patient assistance program through [employer]. Will collaborate with provider, CPhT, and patient to pursue assistance.     Follow Up Plan: Will follow up with patient  in 6 weeks for diabetes management.   Heather Factor, PharmD Clinical Pharmacist  [phone]

## 2024-04-27 DIAGNOSIS — I10 Essential (primary) hypertension: Secondary | ICD-10-CM | POA: Diagnosis not present

## 2024-04-27 DIAGNOSIS — N1831 Chronic kidney disease, stage 3a: Secondary | ICD-10-CM | POA: Diagnosis not present

## 2024-04-27 DIAGNOSIS — E785 Hyperlipidemia, unspecified: Secondary | ICD-10-CM | POA: Diagnosis not present

## 2024-04-27 DIAGNOSIS — E1129 Type 2 diabetes mellitus with other diabetic kidney complication: Secondary | ICD-10-CM | POA: Diagnosis not present

## 2024-05-13 ENCOUNTER — Other Ambulatory Visit: Payer: Self-pay | Admitting: Orthopedic Surgery

## 2024-05-27 ENCOUNTER — Other Ambulatory Visit: Payer: Self-pay | Admitting: Orthopedic Surgery

## 2024-05-27 ENCOUNTER — Encounter: Payer: Self-pay | Admitting: General Surgery

## 2024-05-27 ENCOUNTER — Ambulatory Visit (INDEPENDENT_AMBULATORY_CARE_PROVIDER_SITE_OTHER): Admitting: General Surgery

## 2024-05-27 ENCOUNTER — Other Ambulatory Visit: Payer: Self-pay

## 2024-05-27 VITALS — BP 117/70 | HR 82 | Temp 98.3°F | Resp 16 | Ht 65.0 in | Wt 171.4 lb

## 2024-05-27 DIAGNOSIS — K432 Incisional hernia without obstruction or gangrene: Secondary | ICD-10-CM

## 2024-05-27 NOTE — Progress Notes (Signed)
 [facility]  Doing well. No complaints. Has vague transient discomfort under the umbilicus at times but it is much better than prior.  BP 117/70   Pulse 82   Temp 98.3 F (36.8 C) (Oral)   Resp 16   Ht 5' 5 (1.651 m)   Wt 171 lb 6.4 oz (77.7 kg)   SpO2 97%   BMI 28.52 kg/m  Firmness induration from old mesh on the right lower umbilicus much improved  No signs of recurrent hernia   Patient s/p robotic assisted ventral hernia doing well.   You can lift and do activity as tolerated. Call the office with concerns.   Manuelita Pander, MD [facility] [address] [place], [place] [address] [phone] (office)

## 2024-05-27 NOTE — Patient Instructions (Signed)
 You can lift and do activity as tolerated. Call the office with concerns.

## 2024-07-06 DIAGNOSIS — I10 Essential (primary) hypertension: Secondary | ICD-10-CM | POA: Diagnosis not present

## 2024-07-06 DIAGNOSIS — E7800 Pure hypercholesterolemia, unspecified: Secondary | ICD-10-CM | POA: Diagnosis not present

## 2024-07-06 DIAGNOSIS — E1122 Type 2 diabetes mellitus with diabetic chronic kidney disease: Secondary | ICD-10-CM | POA: Diagnosis not present

## 2024-07-06 DIAGNOSIS — I129 Hypertensive chronic kidney disease with stage 1 through stage 4 chronic kidney disease, or unspecified chronic kidney disease: Secondary | ICD-10-CM | POA: Diagnosis not present

## 2024-07-06 DIAGNOSIS — N1832 Chronic kidney disease, stage 3b: Secondary | ICD-10-CM | POA: Diagnosis not present

## 2024-07-06 DIAGNOSIS — N390 Urinary tract infection, site not specified: Secondary | ICD-10-CM | POA: Diagnosis not present

## 2024-07-07 ENCOUNTER — Other Ambulatory Visit: Payer: Self-pay | Admitting: Orthopedic Surgery

## 2024-07-08 DIAGNOSIS — H25813 Combined forms of age-related cataract, bilateral: Secondary | ICD-10-CM | POA: Diagnosis not present

## 2024-07-09 ENCOUNTER — Telehealth: Payer: Self-pay

## 2024-07-09 NOTE — Progress Notes (Signed)
   07/09/2024  Patient ID: [name], female   DOB: [birth year], 73 y.o.   MRN: [id number]  Contacted patient regarding referral for medication access from Leigh Lung, MD .   Left patient a voicemail to return my call at their convenience  Heather Factor, PharmD Clinical Pharmacist  [phone]

## 2024-07-09 NOTE — Telephone Encounter (Signed)
 PAP: Application for Marcelline Deist has been submitted to [employer] ([employer]), via fax

## 2024-07-10 NOTE — Telephone Encounter (Signed)
 PAP: Patient assistance application for Farxiga has been approved by PAP Companies: [employer] from 07/09/2024 to 10/14/2025. Medication should be delivered to PAP Delivery: Home. For further shipping updates, please contact [employer] ([employer]) at [phone]. Patient ID is: [id number]

## 2024-07-10 NOTE — Progress Notes (Signed)
 Pharmacy Medication Assistance Program Note    07/10/2024  Patient ID: [name], female   DOB: [birth year], 73 y.o.   MRN: [id number]     02/21/2024 07/09/2024  Outreach [facility]ication [facility]  Initial Outreach Date ([facility]ication One)  07/09/2024  Manufacturer Medication One [employer] [facility] Nurse, adult Drugs  Farxiga  Dose of Farxiga  5mg   [facility] Novolog  70/30   Dose of Rybelsus 14mg    Type of Chief Technology Officer Items Requested  Application  Name of Prescriber [name] [name]  Date Application Received From Patient 02/21/2024 07/09/2024  Application Items Received From Patient  Application  Date Application Received From Provider 02/21/2024 07/09/2024  Date Application Submitted to Manufacturer 02/21/2024 07/09/2024  Method Application Sent to Manufacturer Fax Fax  Patient Assistance Determination Approved Approved  Approval Start Date 02/25/2024 07/09/2024  Approval End Date 10/14/2024 10/14/2025  Patient Notification Method MyChart Telephone Call     Signature

## 2024-07-16 NOTE — Progress Notes (Signed)
   07/16/2024  Patient ID: [name], female   DOB: Nov 03, 1950, 73 y.o.   MRN: [id number]  Contacted patient regarding referral for medication access from Leigh Lung, MD .   Spoke to patient today, informed of Farxiga PAP approval. Pt recently picked up other DM medications from clinic. I informed her of the recent [employer] PAP update regarding Rybelsus. I also provided her with the contact information for [employer] [facility]. She has a PCP appt on 07/20/24, I will follow up with her after that. She will reach out to me with any questions or concerns.   Heather Factor, PharmD Clinical Pharmacist  [phone]

## 2024-07-18 ENCOUNTER — Other Ambulatory Visit: Payer: Self-pay | Admitting: Gastroenterology

## 2024-07-18 ENCOUNTER — Other Ambulatory Visit: Payer: Self-pay | Admitting: Internal Medicine

## 2024-07-18 DIAGNOSIS — K219 Gastro-esophageal reflux disease without esophagitis: Secondary | ICD-10-CM

## 2024-07-20 DIAGNOSIS — R300 Dysuria: Secondary | ICD-10-CM | POA: Diagnosis not present

## 2024-07-20 DIAGNOSIS — I10 Essential (primary) hypertension: Secondary | ICD-10-CM | POA: Diagnosis not present

## 2024-07-20 DIAGNOSIS — N390 Urinary tract infection, site not specified: Secondary | ICD-10-CM | POA: Diagnosis not present

## 2024-07-20 DIAGNOSIS — E1169 Type 2 diabetes mellitus with other specified complication: Secondary | ICD-10-CM | POA: Diagnosis not present

## 2024-07-29 ENCOUNTER — Other Ambulatory Visit: Payer: Self-pay | Admitting: Internal Medicine

## 2024-08-13 ENCOUNTER — Ambulatory Visit: Payer: Medicare Other | Admitting: Orthopedic Surgery

## 2024-08-17 ENCOUNTER — Encounter: Payer: Self-pay | Admitting: Radiology

## 2024-08-18 ENCOUNTER — Telehealth: Payer: Self-pay

## 2024-08-18 ENCOUNTER — Other Ambulatory Visit: Payer: Self-pay | Admitting: Internal Medicine

## 2024-08-18 NOTE — Telephone Encounter (Signed)
 PAP: Patient assistance application for Novolin through [employer] has been mailed to pt's home address on file. Provider portion of application will be faxed to provider's office.  Provider portion will be faxed to Dr. Elna Potters at [facility] with patient and she preferred to have her application mailed to her home

## 2024-08-25 ENCOUNTER — Telehealth: Payer: Self-pay | Admitting: *Deleted

## 2024-08-25 NOTE — Telephone Encounter (Signed)
 Received refill request for pantoprazole  40mg . Pt last OV 12/20/2023

## 2024-08-26 NOTE — Telephone Encounter (Signed)
 Noted

## 2024-09-01 NOTE — Telephone Encounter (Signed)
 Received patient portion of patient assistance application

## 2024-09-02 ENCOUNTER — Other Ambulatory Visit: Payer: Self-pay

## 2024-09-02 NOTE — Telephone Encounter (Signed)
 [facility] mail order pharmacy requesting clarification for medication ezetimibe  stating that the number of refills for ezetimibe  tablets 10 mg note says 2nd attempt to schedule follow up appt but 11 refills was given. Please clarify    Ph# [phone]    Inv# [id number]

## 2024-09-03 ENCOUNTER — Telehealth: Payer: Self-pay

## 2024-09-03 NOTE — Progress Notes (Signed)
   09/03/2024  Patient ID: [name], female   DOB: 1950/12/17, 73 y.o.   MRN: [id number]  Contacted patient regarding referral for diabetes and medication access from Leigh Lung, MD .   Left patient a voicemail to return my call at their convenience  Heather Factor, PharmD Clinical Pharmacist  [phone]

## 2024-09-03 NOTE — Progress Notes (Signed)
   09/03/2024  Patient ID: [name], female   DOB: Dec 22, 1950, 73 y.o.   MRN: [id number]  Patient returned my phone call. Contacted patient regarding referral for diabetes and medication access from Leigh Lung, MD .   Appointment scheduled for 09/24/24 at 10:00 am.   Heather Factor, PharmD Clinical Pharmacist  [phone]

## 2024-09-04 MED ORDER — EZETIMIBE 10 MG PO TABS: 10.0000 mg | ORAL_TABLET | Freq: Every day | ORAL | 0 refills | Status: AC

## 2024-09-04 NOTE — Telephone Encounter (Signed)
 PAP: Application for Novolin has been submitted to [employer], via fax

## 2024-09-08 NOTE — Telephone Encounter (Signed)
 PAP: Patient assistance application for Novolin has been approved by PAP Companies: [employer] from 10/15/2024 to 10/14/2025. Medication should be delivered to PAP Delivery: Provider's office. For further shipping updates, please contact [employer] at [phone]. Patient ID is: [id number]

## 2024-09-24 ENCOUNTER — Telehealth: Payer: Self-pay

## 2024-09-24 NOTE — Progress Notes (Signed)
° °  09/24/2024  Patient ID: Rollene LITTIE Slade, female   DOB: May 23, 1951, 73 y.o.   MRN: [id number]  Attempted to contact patient for scheduled appointment for medication management.   I spoke with the patient today and she was heading out for an appointment. She requested that we reschedule for next week 10/01/24 at 10:00 am.   Heather Factor, PharmD Clinical Pharmacist  [phone]

## 2024-10-01 ENCOUNTER — Telehealth: Payer: Self-pay

## 2024-10-01 NOTE — Progress Notes (Signed)
° °  10/01/2024  Patient ID: [name], female   DOB: January 25, 1951, 73 y.o.   MRN: [id number]  Contacted patient regarding referral for diabetes from a quality report .   Attempted to contact patient for scheduled appointment for medication management. Left HIPAA compliant message for patient to return my call at their convenience.   Heather Factor, PharmD Clinical Pharmacist  [phone]
